# Patient Record
Sex: Male | Born: 1945 | Race: White | Hispanic: No | Marital: Married | Smoking: Never smoker
Health system: Southern US, Community
[De-identification: ages and names within clinical notes are randomized; demographics above are authoritative.]

## PROBLEM LIST (undated history)

## (undated) DIAGNOSIS — M79609 Pain in unspecified limb: Secondary | ICD-10-CM

## (undated) DIAGNOSIS — J449 Chronic obstructive pulmonary disease, unspecified: Secondary | ICD-10-CM

## (undated) DIAGNOSIS — I2510 Atherosclerotic heart disease of native coronary artery without angina pectoris: Secondary | ICD-10-CM

## (undated) DIAGNOSIS — J45909 Unspecified asthma, uncomplicated: Secondary | ICD-10-CM

## (undated) DIAGNOSIS — C61 Malignant neoplasm of prostate: Secondary | ICD-10-CM

## (undated) DIAGNOSIS — N209 Urinary calculus, unspecified: Secondary | ICD-10-CM

## (undated) DIAGNOSIS — M479 Spondylosis, unspecified: Secondary | ICD-10-CM

## (undated) DIAGNOSIS — E119 Type 2 diabetes mellitus without complications: Secondary | ICD-10-CM

## (undated) DIAGNOSIS — T7840XA Allergy, unspecified, initial encounter: Secondary | ICD-10-CM

## (undated) DIAGNOSIS — Z87442 Personal history of urinary calculi: Secondary | ICD-10-CM

## (undated) DIAGNOSIS — M549 Dorsalgia, unspecified: Secondary | ICD-10-CM

## (undated) DIAGNOSIS — K222 Esophageal obstruction: Secondary | ICD-10-CM

## (undated) DIAGNOSIS — G8929 Other chronic pain: Secondary | ICD-10-CM

## (undated) DIAGNOSIS — I071 Rheumatic tricuspid insufficiency: Secondary | ICD-10-CM

## (undated) DIAGNOSIS — N200 Calculus of kidney: Secondary | ICD-10-CM

## (undated) DIAGNOSIS — R32 Unspecified urinary incontinence: Secondary | ICD-10-CM

## (undated) DIAGNOSIS — N400 Enlarged prostate without lower urinary tract symptoms: Secondary | ICD-10-CM

## (undated) DIAGNOSIS — E11319 Type 2 diabetes mellitus with unspecified diabetic retinopathy without macular edema: Secondary | ICD-10-CM

## (undated) DIAGNOSIS — D126 Benign neoplasm of colon, unspecified: Secondary | ICD-10-CM

## (undated) DIAGNOSIS — J309 Allergic rhinitis, unspecified: Secondary | ICD-10-CM

## (undated) DIAGNOSIS — D649 Anemia, unspecified: Secondary | ICD-10-CM

## (undated) DIAGNOSIS — I10 Essential (primary) hypertension: Secondary | ICD-10-CM

## (undated) DIAGNOSIS — E1129 Type 2 diabetes mellitus with other diabetic kidney complication: Secondary | ICD-10-CM

## (undated) DIAGNOSIS — E785 Hyperlipidemia, unspecified: Secondary | ICD-10-CM

## (undated) DIAGNOSIS — K219 Gastro-esophageal reflux disease without esophagitis: Secondary | ICD-10-CM

## (undated) DIAGNOSIS — R1310 Dysphagia, unspecified: Secondary | ICD-10-CM

## (undated) HISTORY — DX: Unspecified urinary incontinence: R32

## (undated) HISTORY — DX: Spondylosis, unspecified: M47.9

## (undated) HISTORY — DX: Type 2 diabetes mellitus with other diabetic kidney complication: E11.29

## (undated) HISTORY — DX: Unspecified asthma, uncomplicated: J45.909

## (undated) HISTORY — DX: Malignant neoplasm of prostate: C61

## (undated) HISTORY — DX: Esophageal obstruction: K22.2

## (undated) HISTORY — DX: Benign prostatic hyperplasia without lower urinary tract symptoms: N40.0

## (undated) HISTORY — PX: HAND SURGERY: SHX662

## (undated) HISTORY — DX: Pain in unspecified limb: M79.609

## (undated) HISTORY — DX: Chronic obstructive pulmonary disease, unspecified: J44.9

## (undated) HISTORY — DX: Type 2 diabetes mellitus without complications: E11.9

## (undated) HISTORY — PX: POLYPECTOMY: SHX149

## (undated) HISTORY — DX: Anemia, unspecified: D64.9

## (undated) HISTORY — PX: LITHOTRIPSY: SUR834

## (undated) HISTORY — DX: Other chronic pain: G89.29

## (undated) HISTORY — DX: Allergy, unspecified, initial encounter: T78.40XA

## (undated) HISTORY — PX: OTHER SURGICAL HISTORY: SHX169

## (undated) HISTORY — DX: Gastro-esophageal reflux disease without esophagitis: K21.9

## (undated) HISTORY — DX: Dorsalgia, unspecified: M54.9

## (undated) HISTORY — DX: Calculus of kidney: N20.0

## (undated) HISTORY — PX: COLONOSCOPY: SHX174

## (undated) HISTORY — DX: Allergic rhinitis, unspecified: J30.9

## (undated) HISTORY — DX: Benign neoplasm of colon, unspecified: D12.6

## (undated) HISTORY — PX: SHOULDER SURGERY: SHX246

## (undated) HISTORY — DX: Dysphagia, unspecified: R13.10

## (undated) HISTORY — DX: Urinary calculus, unspecified: N20.9

## (undated) HISTORY — DX: Essential (primary) hypertension: I10

## (undated) HISTORY — DX: Type 2 diabetes mellitus with unspecified diabetic retinopathy without macular edema: E11.319

## (undated) HISTORY — DX: Hyperlipidemia, unspecified: E78.5

---

## 2000-03-31 ENCOUNTER — Encounter: Payer: Self-pay | Admitting: Endocrinology

## 2000-03-31 ENCOUNTER — Ambulatory Visit (HOSPITAL_COMMUNITY): Admission: RE | Admit: 2000-03-31 | Discharge: 2000-03-31 | Payer: Self-pay | Admitting: Endocrinology

## 2001-06-04 ENCOUNTER — Encounter: Payer: Self-pay | Admitting: Gastroenterology

## 2001-06-24 DIAGNOSIS — D126 Benign neoplasm of colon, unspecified: Secondary | ICD-10-CM

## 2001-06-24 HISTORY — DX: Benign neoplasm of colon, unspecified: D12.6

## 2001-07-09 ENCOUNTER — Encounter: Payer: Self-pay | Admitting: Gastroenterology

## 2001-07-09 ENCOUNTER — Encounter (INDEPENDENT_AMBULATORY_CARE_PROVIDER_SITE_OTHER): Payer: Self-pay | Admitting: Specialist

## 2001-07-09 ENCOUNTER — Other Ambulatory Visit: Admission: RE | Admit: 2001-07-09 | Discharge: 2001-07-09 | Payer: Self-pay | Admitting: Gastroenterology

## 2001-07-09 LAB — HM COLONOSCOPY

## 2005-08-18 ENCOUNTER — Ambulatory Visit: Payer: Self-pay | Admitting: Endocrinology

## 2005-08-22 ENCOUNTER — Ambulatory Visit: Payer: Self-pay | Admitting: Endocrinology

## 2005-09-05 ENCOUNTER — Ambulatory Visit: Payer: Self-pay

## 2006-09-10 ENCOUNTER — Ambulatory Visit: Payer: Self-pay | Admitting: Endocrinology

## 2006-09-10 LAB — CONVERTED CEMR LAB
Creatinine,U: 99.6 mg/dL
Hgb A1c MFr Bld: 8.2 % — ABNORMAL HIGH
Microalb Creat Ratio: 72.3 mg/g — ABNORMAL HIGH
Microalb, Ur: 7.2 mg/dL — ABNORMAL HIGH
Uric Acid, Serum: 4.4 mg/dL

## 2006-09-23 ENCOUNTER — Ambulatory Visit: Payer: Self-pay | Admitting: Endocrinology

## 2007-07-31 ENCOUNTER — Inpatient Hospital Stay: Payer: Self-pay | Admitting: Urology

## 2007-08-01 ENCOUNTER — Other Ambulatory Visit: Payer: Self-pay

## 2007-08-01 IMAGING — CR DG ABDOMEN 1V
1 series · 2 of 2 positions shown · non-contrast
Comparison: none

REASON FOR EXAM: localize stones
COMMENTS:

PROCEDURE:     DXR - DXR KIDNEY URETER BLADDER  - [DATE]  [DATE]
RESULT:     Soft tissue structures are unremarkable. Bilateral
nephrolithiasis is noted.

[Series 1: view not recorded · 0.17mm/px · 2 of 2 slices shown]
[im 1/2]
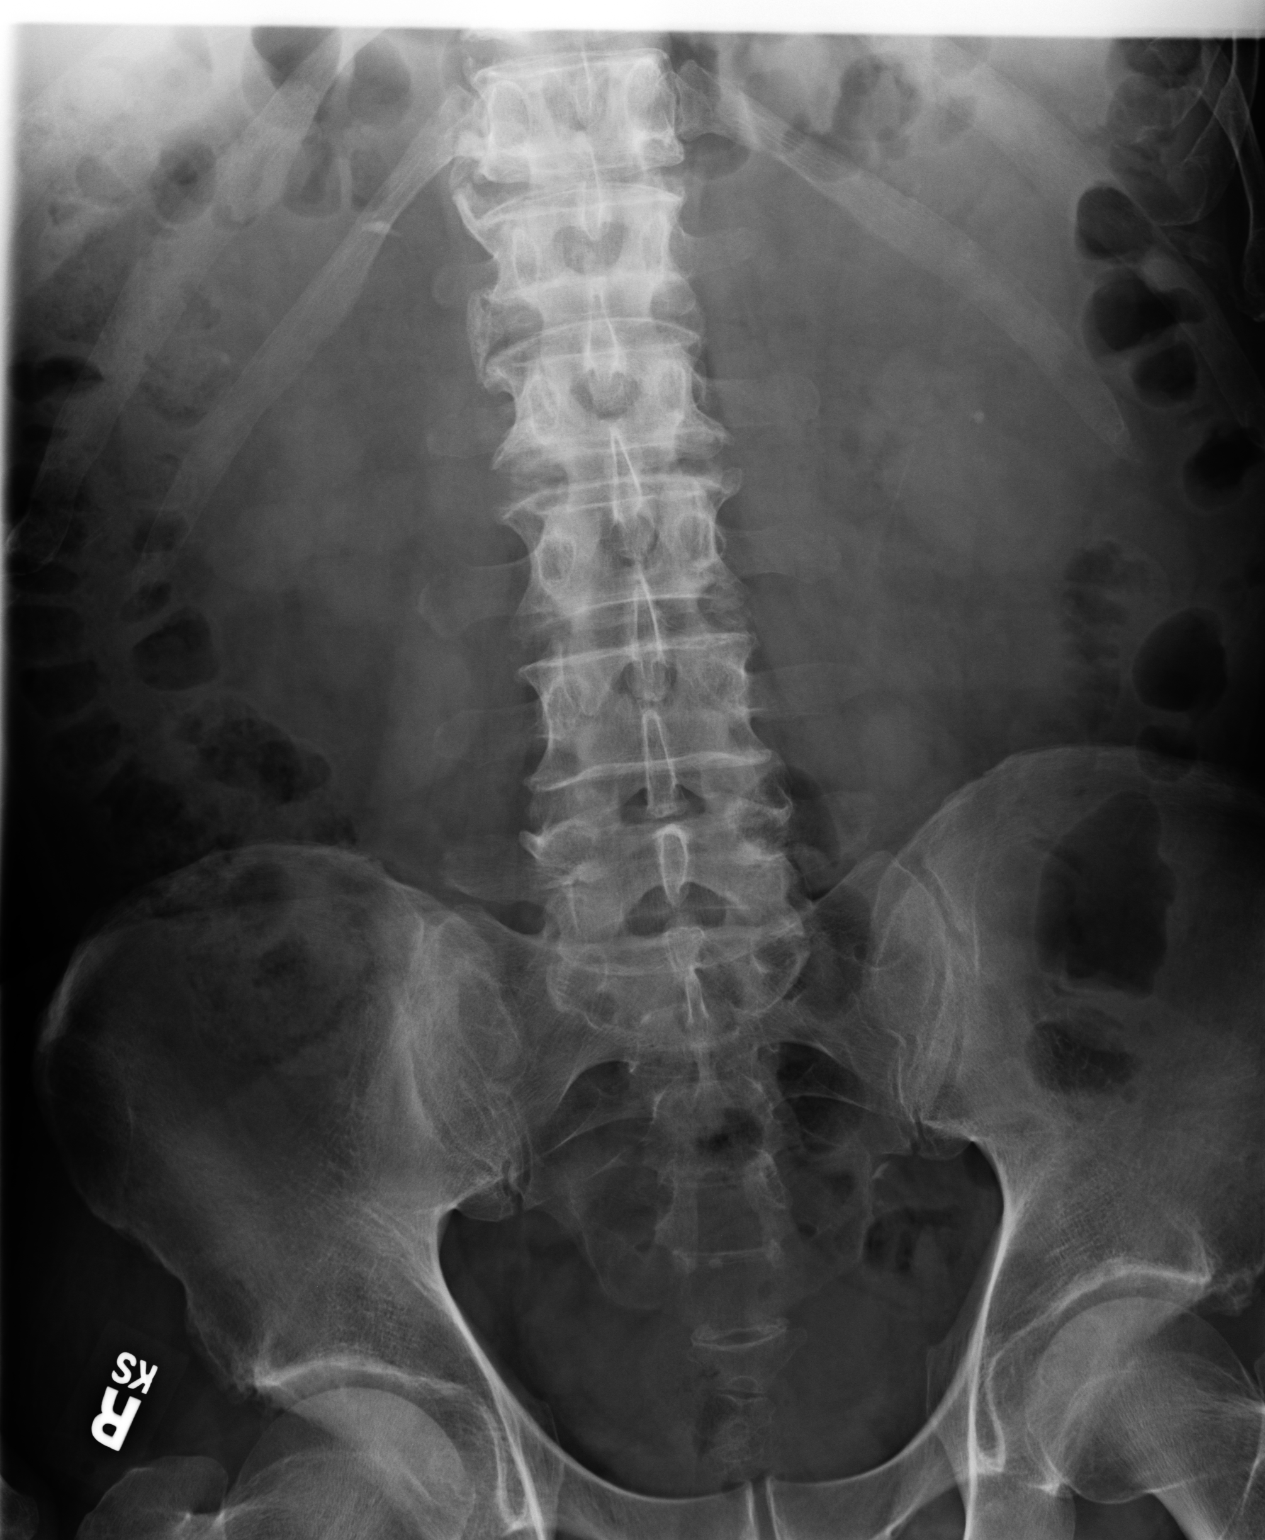
[im 2/2]
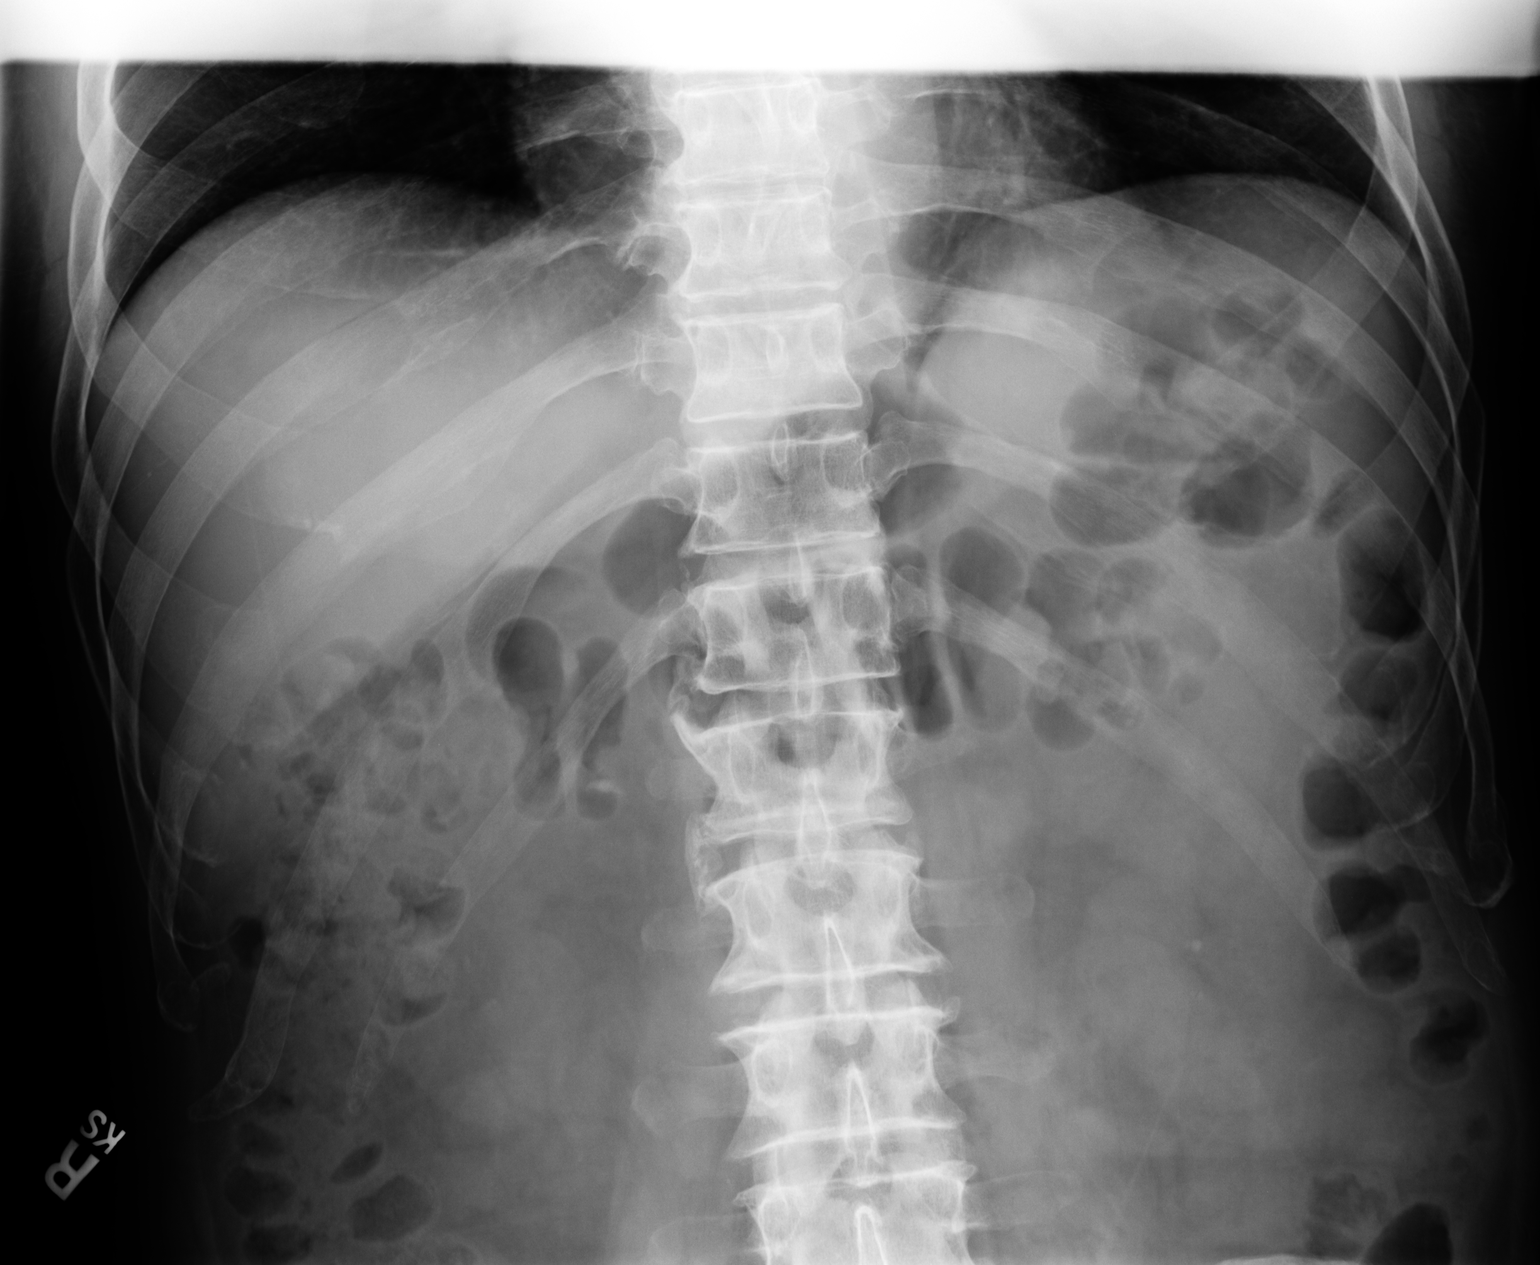

[2 of 2 positions shown; findings below may reference images not displayed]

IMPRESSION: 1)No acute or focal abnormalities are identified.

2)Small bilateral renal caliceal stones.

## 2007-08-03 ENCOUNTER — Encounter: Payer: Self-pay | Admitting: Endocrinology

## 2007-08-03 DIAGNOSIS — Z860100 Personal history of colon polyps, unspecified: Secondary | ICD-10-CM | POA: Insufficient documentation

## 2007-08-03 DIAGNOSIS — J309 Allergic rhinitis, unspecified: Secondary | ICD-10-CM

## 2007-08-03 DIAGNOSIS — E785 Hyperlipidemia, unspecified: Secondary | ICD-10-CM

## 2007-08-03 DIAGNOSIS — N400 Enlarged prostate without lower urinary tract symptoms: Secondary | ICD-10-CM

## 2007-08-03 HISTORY — DX: Hyperlipidemia, unspecified: E78.5

## 2007-08-03 HISTORY — DX: Allergic rhinitis, unspecified: J30.9

## 2007-08-03 HISTORY — DX: Benign prostatic hyperplasia without lower urinary tract symptoms: N40.0

## 2007-08-05 ENCOUNTER — Ambulatory Visit: Payer: Self-pay | Admitting: Urology

## 2007-08-05 IMAGING — CR DG ABDOMEN 1V
1 series · 1 of 1 positions shown · non-contrast
Comparison: none

REASON FOR EXAM: Renal calculi-lithotripsy
COMMENTS:

[view not recorded]
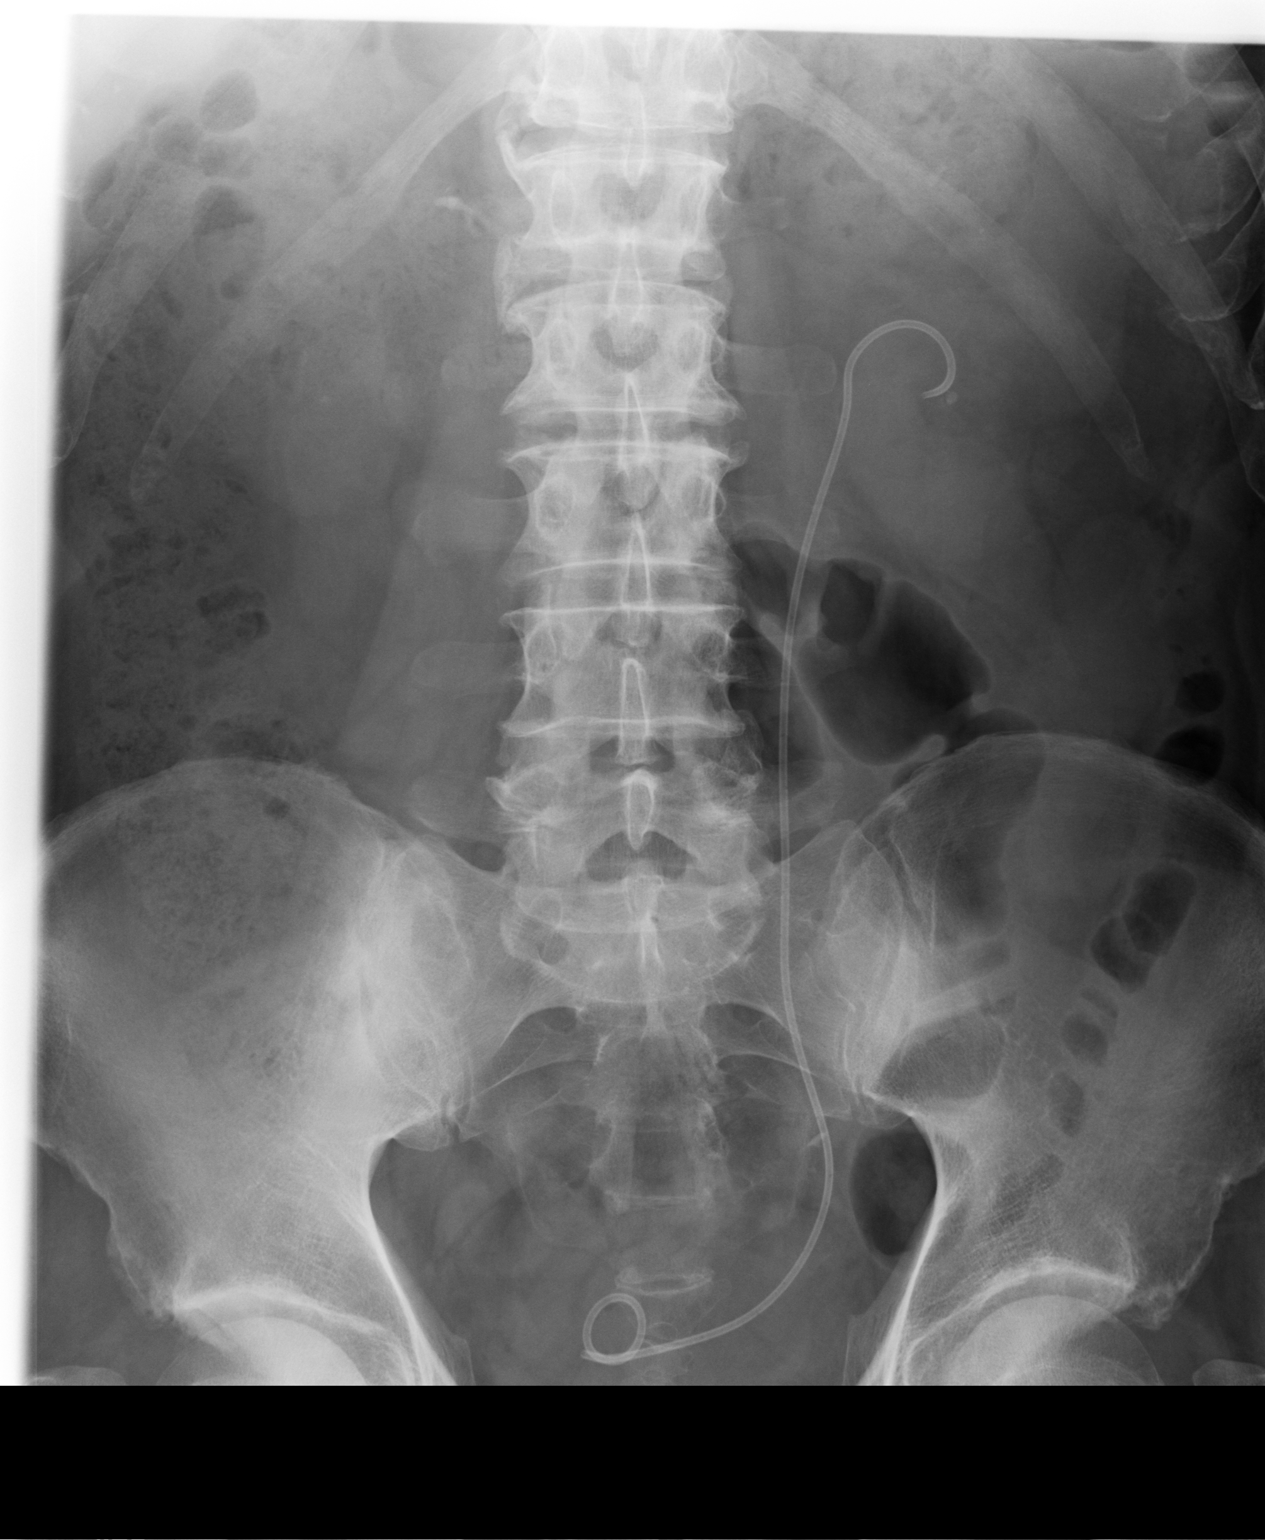

[1 of 1 positions shown; findings below may reference images not displayed]

PROCEDURE:     DXR - DXR KIDNEY URETER BLADDER  - [DATE] [DATE]

RESULT:     This is a double-J ureteral stent on the LEFT. The proximal tip
of the stent lies adjacent to a calcification in a lower pole calix on the
LEFT. This calcification appears unchanged from [DATE]. There is mild
degenerative change of the lumbar spine. The bowel gas pattern is normal.
Faint calcification projects over the lower pole of the RIGHT kidney.
IMPRESSION: There is a double-J ureteral stent on the LEFT.

Further interpretation is deferred to Dr. TOBI.

## 2007-09-14 ENCOUNTER — Ambulatory Visit: Payer: Self-pay | Admitting: Endocrinology

## 2007-09-14 LAB — CONVERTED CEMR LAB
ALT: 15 units/L (ref 0–53)
Albumin: 3.9 g/dL (ref 3.5–5.2)
Alkaline Phosphatase: 40 units/L (ref 39–117)
BUN: 15 mg/dL (ref 6–23)
Bacteria, UA: NEGATIVE
Basophils Relative: 0 % (ref 0.0–1.0)
CO2: 30 meq/L (ref 19–32)
Calcium: 9.5 mg/dL (ref 8.4–10.5)
Chloride: 107 meq/L (ref 96–112)
Cholesterol: 78 mg/dL (ref 0–200)
Eosinophils Absolute: 0.3 10*3/uL (ref 0.0–0.6)
Eosinophils Relative: 3 % (ref 0.0–5.0)
GFR calc Af Amer: 88 mL/min
GFR calc non Af Amer: 73 mL/min
Glucose, Bld: 172 mg/dL — ABNORMAL HIGH (ref 70–99)
HDL: 40 mg/dL (ref >39.0)
Hgb A1c MFr Bld: 8.5 % — ABNORMAL HIGH (ref 4.6–6.0)
Ketones, ur: NEGATIVE mg/dL
Leukocytes, UA: NEGATIVE
MCV: 94 fL (ref 78.0–100.0)
Microalb Creat Ratio: 117.5 mg/g — ABNORMAL HIGH (ref 0.0–30.0)
Microalb, Ur: 14.7 mg/dL — ABNORMAL HIGH (ref 0.0–1.9)
Monocytes Relative: 9 % (ref 3.0–11.0)
Neutro Abs: 4.9 10*3/uL (ref 1.4–7.7)
Platelets: 160 10*3/uL (ref 150–400)
Potassium: 4.5 meq/L (ref 3.5–5.1)
RBC: 3.81 M/uL — ABNORMAL LOW (ref 4.22–5.81)
RDW: 13.4 % (ref 11.5–14.6)
Specific Gravity, Urine: 1.025 (ref 1.000–1.03)
Squamous Epithelial / LPF: NEGATIVE /lpf
TSH: 0.8 microintl units/mL (ref 0.35–5.50)
Total CHOL/HDL Ratio: 2
Total Protein: 6.8 g/dL (ref 6.0–8.3)
Triglycerides: 56 mg/dL (ref 0–149)
Urine Glucose: NEGATIVE mg/dL
Urobilinogen, UA: 0.2 (ref 0.0–1.0)
VLDL: 11 mg/dL (ref 0–40)
WBC: 8.5 10*3/uL (ref 4.5–10.5)
pH: 5.5 (ref 5.0–8.0)

## 2007-09-22 ENCOUNTER — Ambulatory Visit: Payer: Self-pay | Admitting: Endocrinology

## 2007-09-22 IMAGING — CR DG CHEST 2V
2 series · 2 of 2 positions shown · non-contrast
Comparison: None available.

CLINICAL DATA: Cough. 
CHEST - 2 VIEW:

[view not recorded (1 of 2)]
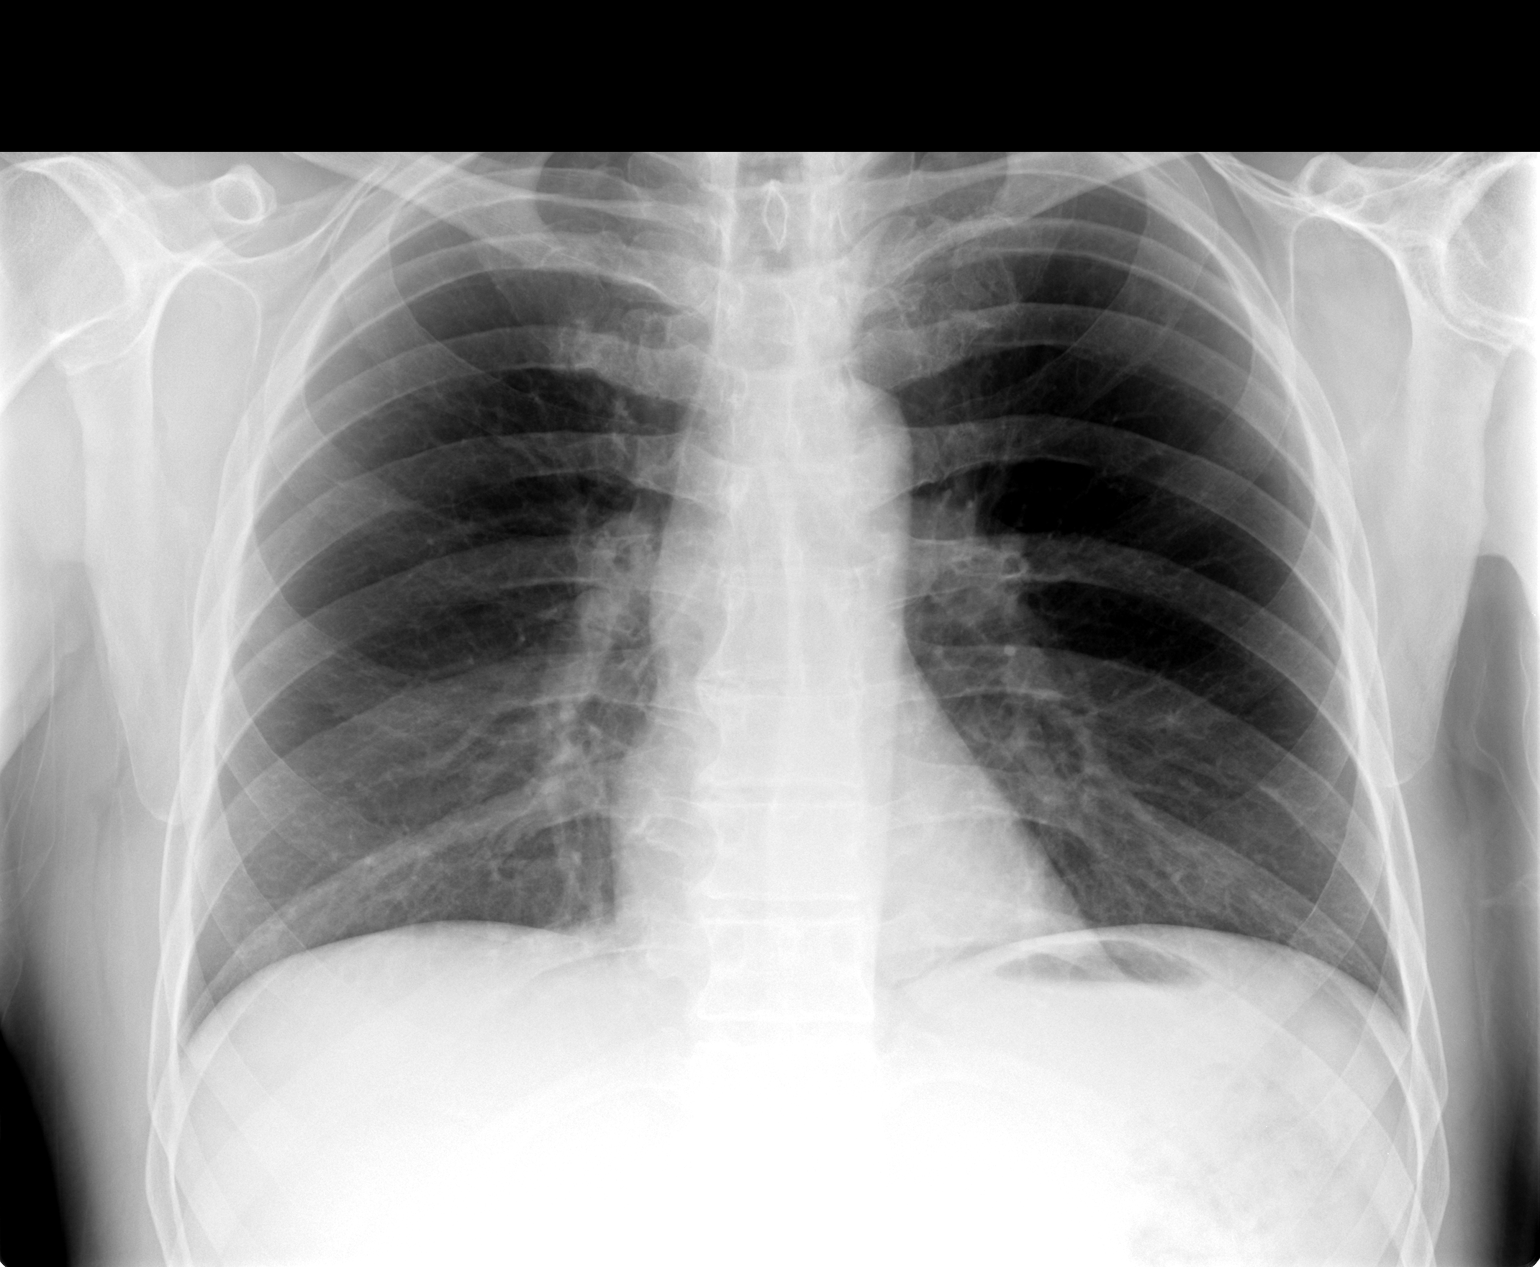

[view not recorded (2 of 2)]
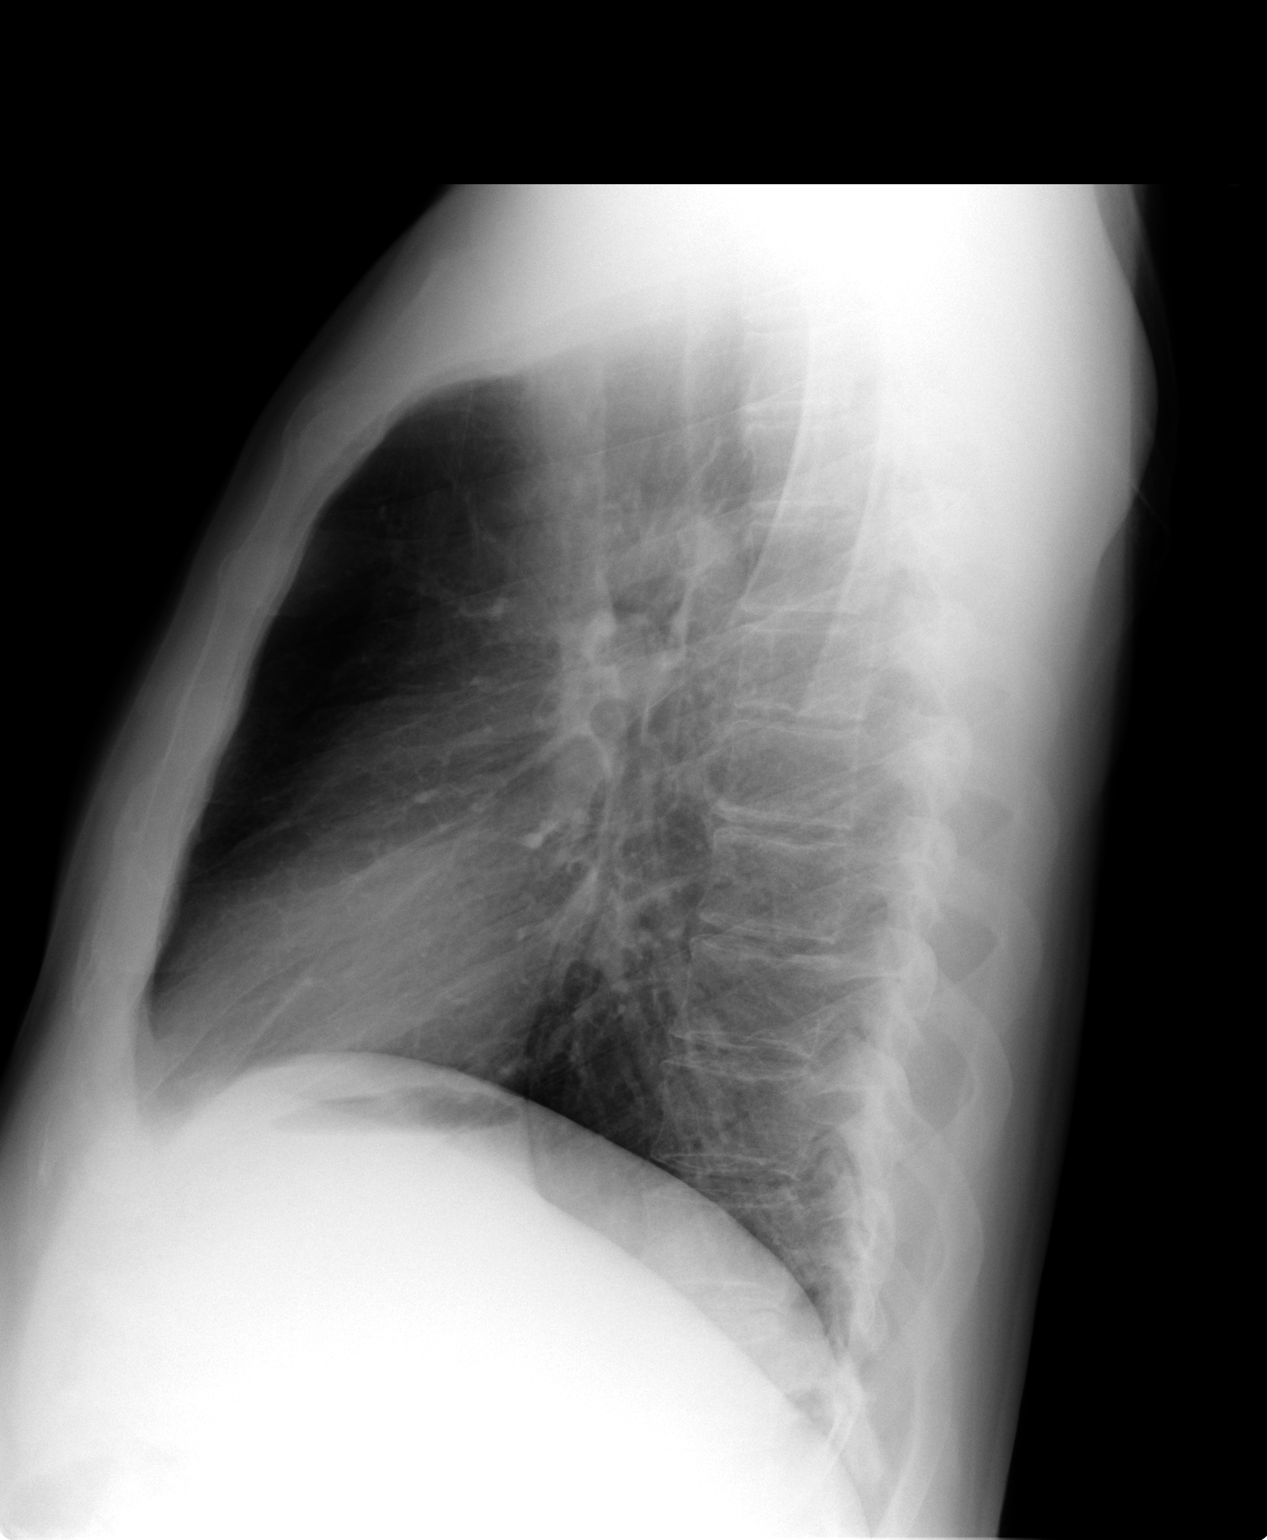

[2 of 2 positions shown; findings below may reference images not displayed]

FINDINGS: The heart size and mediastinal contours are within normal limits.  Both lungs are clear.  The visualized skeletal structures are unremarkable.  
I get the impression there may be emphysematous changes in the left mid to upper lung zone as the pulmonary markings including vascular markings appear attenuated and sparse.
IMPRESSION: No acute chest findings.  Suspicion for emphysematous changes particularly involving the left lung.

## 2007-12-03 ENCOUNTER — Encounter: Payer: Self-pay | Admitting: Endocrinology

## 2007-12-29 ENCOUNTER — Ambulatory Visit: Payer: Self-pay | Admitting: Endocrinology

## 2007-12-29 DIAGNOSIS — D649 Anemia, unspecified: Secondary | ICD-10-CM

## 2007-12-29 DIAGNOSIS — E7800 Pure hypercholesterolemia, unspecified: Secondary | ICD-10-CM

## 2007-12-29 HISTORY — DX: Anemia, unspecified: D64.9

## 2007-12-29 LAB — CONVERTED CEMR LAB
ALT: 18 units/L (ref 0–53)
AST: 20 units/L (ref 0–37)
Bilirubin, Direct: 0.2 mg/dL (ref 0.0–0.3)
Cholesterol: 108 mg/dL (ref 0–200)
Eosinophils Absolute: 0.3 10*3/uL (ref 0.0–0.6)
Eosinophils Relative: 5.9 % — ABNORMAL HIGH (ref 0.0–5.0)
HCT: 36.9 % — ABNORMAL LOW (ref 39.0–52.0)
Hemoglobin: 12.4 g/dL — ABNORMAL LOW (ref 13.0–17.0)
Iron: 63 ug/dL (ref 42–165)
MCV: 95.1 fL (ref 78.0–100.0)
Monocytes Absolute: 0.7 10*3/uL (ref 0.2–0.7)
Neutrophils Relative %: 49 % (ref 43.0–77.0)
RBC: 3.89 M/uL — ABNORMAL LOW (ref 4.22–5.81)
RDW: 13.8 % (ref 11.5–14.6)
Total CHOL/HDL Ratio: 2.9
Total Protein: 6.7 g/dL (ref 6.0–8.3)
WBC: 5.9 10*3/uL (ref 4.5–10.5)

## 2008-01-07 ENCOUNTER — Ambulatory Visit: Payer: Self-pay | Admitting: Endocrinology

## 2008-01-07 DIAGNOSIS — I10 Essential (primary) hypertension: Secondary | ICD-10-CM

## 2008-01-07 DIAGNOSIS — M79609 Pain in unspecified limb: Secondary | ICD-10-CM | POA: Insufficient documentation

## 2008-01-07 DIAGNOSIS — I152 Hypertension secondary to endocrine disorders: Secondary | ICD-10-CM | POA: Insufficient documentation

## 2008-01-07 DIAGNOSIS — E1159 Type 2 diabetes mellitus with other circulatory complications: Secondary | ICD-10-CM | POA: Insufficient documentation

## 2008-01-07 HISTORY — DX: Pain in unspecified limb: M79.609

## 2008-01-07 HISTORY — DX: Essential (primary) hypertension: I10

## 2008-07-04 ENCOUNTER — Telehealth: Payer: Self-pay | Admitting: Endocrinology

## 2008-09-25 ENCOUNTER — Ambulatory Visit: Payer: Self-pay | Admitting: Endocrinology

## 2008-09-25 LAB — CONVERTED CEMR LAB
ALT: 18 units/L (ref 0–53)
Basophils Absolute: 0 10*3/uL (ref 0.0–0.1)
Bilirubin Urine: NEGATIVE
Bilirubin, Direct: 0.1 mg/dL (ref 0.0–0.3)
CO2: 28 meq/L (ref 19–32)
Calcium: 9.1 mg/dL (ref 8.4–10.5)
HDL: 34.9 mg/dL — ABNORMAL LOW (ref >39.0)
Hemoglobin, Urine: NEGATIVE
Ketones, ur: NEGATIVE mg/dL
LDL Cholesterol: 70 mg/dL (ref 0–99)
Leukocytes, UA: NEGATIVE
Lymphocytes Relative: 35.1 % (ref 12.0–46.0)
MCHC: 35 g/dL (ref 30.0–36.0)
Neutro Abs: 2.6 10*3/uL (ref 1.4–7.7)
Neutrophils Relative %: 48.8 % (ref 43.0–77.0)
Platelets: 207 10*3/uL (ref 150–400)
RDW: 13 % (ref 11.5–14.6)
Sodium: 140 meq/L (ref 135–145)
TSH: 0.7 microintl units/mL (ref 0.35–5.50)
Total Bilirubin: 0.7 mg/dL (ref 0.3–1.2)
Total CHOL/HDL Ratio: 3.5
Triglycerides: 92 mg/dL (ref 0–149)
Urobilinogen, UA: 0.2 (ref 0.0–1.0)

## 2008-09-29 ENCOUNTER — Ambulatory Visit: Payer: Self-pay | Admitting: Endocrinology

## 2008-09-29 DIAGNOSIS — R1310 Dysphagia, unspecified: Secondary | ICD-10-CM

## 2008-09-29 DIAGNOSIS — N209 Urinary calculus, unspecified: Secondary | ICD-10-CM | POA: Insufficient documentation

## 2008-09-29 DIAGNOSIS — E113299 Type 2 diabetes mellitus with mild nonproliferative diabetic retinopathy without macular edema, unspecified eye: Secondary | ICD-10-CM

## 2008-09-29 DIAGNOSIS — E1129 Type 2 diabetes mellitus with other diabetic kidney complication: Secondary | ICD-10-CM

## 2008-09-29 DIAGNOSIS — E11319 Type 2 diabetes mellitus with unspecified diabetic retinopathy without macular edema: Secondary | ICD-10-CM | POA: Insufficient documentation

## 2008-09-29 DIAGNOSIS — M479 Spondylosis, unspecified: Secondary | ICD-10-CM

## 2008-09-29 HISTORY — DX: Dysphagia, unspecified: R13.10

## 2008-09-29 HISTORY — DX: Type 2 diabetes mellitus with unspecified diabetic retinopathy without macular edema: E11.319

## 2008-09-29 HISTORY — DX: Urinary calculus, unspecified: N20.9

## 2008-09-29 HISTORY — DX: Spondylosis, unspecified: M47.9

## 2008-09-29 HISTORY — DX: Type 2 diabetes mellitus with other diabetic kidney complication: E11.29

## 2008-11-28 ENCOUNTER — Ambulatory Visit: Payer: Self-pay | Admitting: Gastroenterology

## 2008-11-28 DIAGNOSIS — R1319 Other dysphagia: Secondary | ICD-10-CM

## 2008-11-28 DIAGNOSIS — K219 Gastro-esophageal reflux disease without esophagitis: Secondary | ICD-10-CM

## 2008-11-28 DIAGNOSIS — K5909 Other constipation: Secondary | ICD-10-CM | POA: Insufficient documentation

## 2008-11-28 HISTORY — DX: Gastro-esophageal reflux disease without esophagitis: K21.9

## 2008-12-01 ENCOUNTER — Ambulatory Visit: Payer: Self-pay | Admitting: Gastroenterology

## 2008-12-01 LAB — HM COLONOSCOPY

## 2008-12-28 ENCOUNTER — Ambulatory Visit: Payer: Self-pay | Admitting: Gastroenterology

## 2008-12-28 DIAGNOSIS — K222 Esophageal obstruction: Secondary | ICD-10-CM

## 2008-12-28 HISTORY — DX: Esophageal obstruction: K22.2

## 2009-01-09 ENCOUNTER — Telehealth: Payer: Self-pay | Admitting: Endocrinology

## 2009-09-20 ENCOUNTER — Ambulatory Visit: Payer: Self-pay | Admitting: Urology

## 2009-09-20 IMAGING — CT CT ABDOMEN AND PELVIS WITHOUT AND WITH CONTRAST
2 of 4 series · 13 of 32 positions shown, 18 images · non-contrast
Comparison: none

REASON FOR EXAM: hematuria    stones
COMMENTS:

[Series 4: soft tissue with · axial · 0.81mm/px · z∈[+68,+478]mm · 8 of 106 slices shown, 13 images]
[im 12/106  soft-tissue]
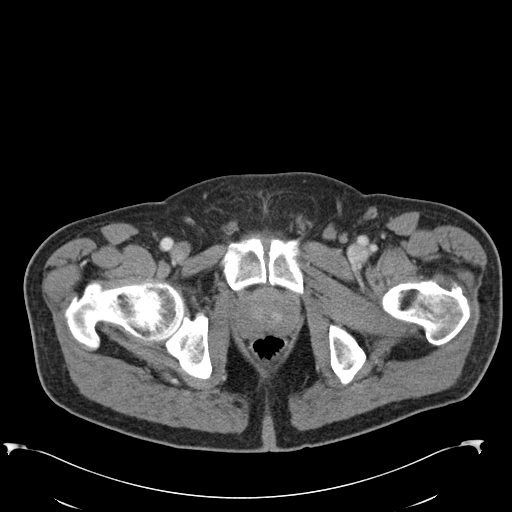
[im 12/106  bone]
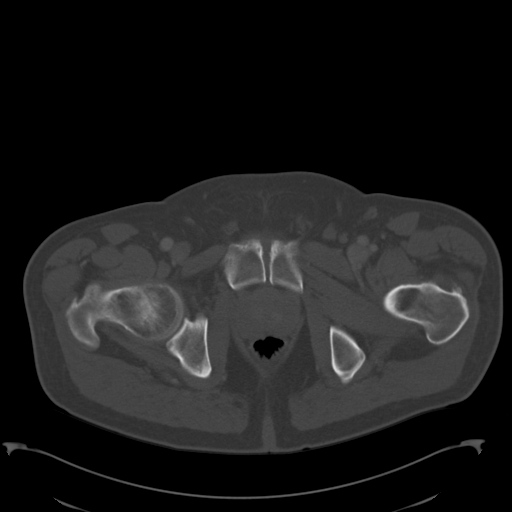
[im 24/106  soft-tissue]
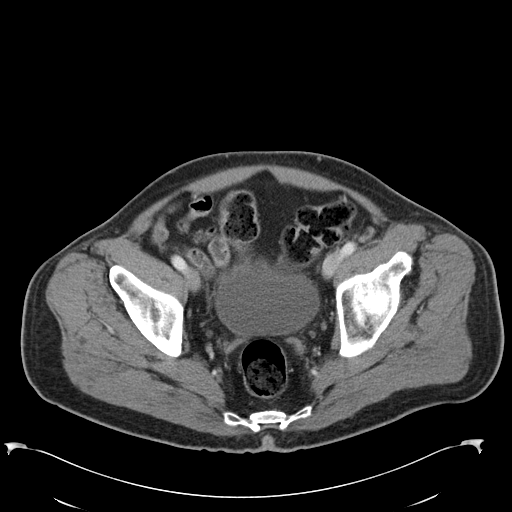
[im 36/106  soft-tissue]
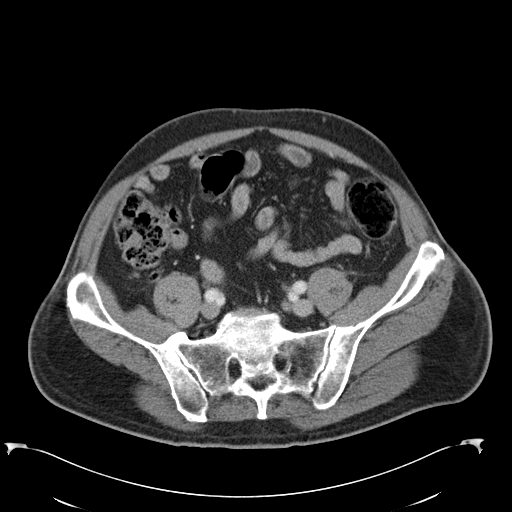
[im 47/106  soft-tissue]
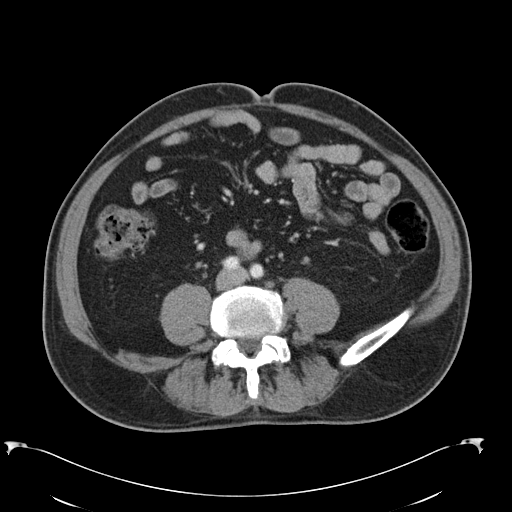
[im 59/106  soft-tissue]
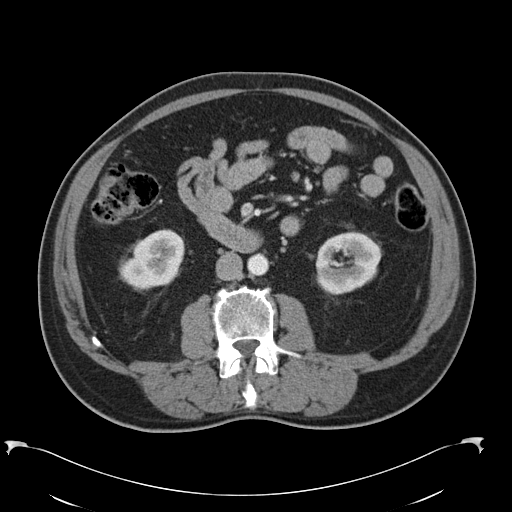
[im 59/106  lung]
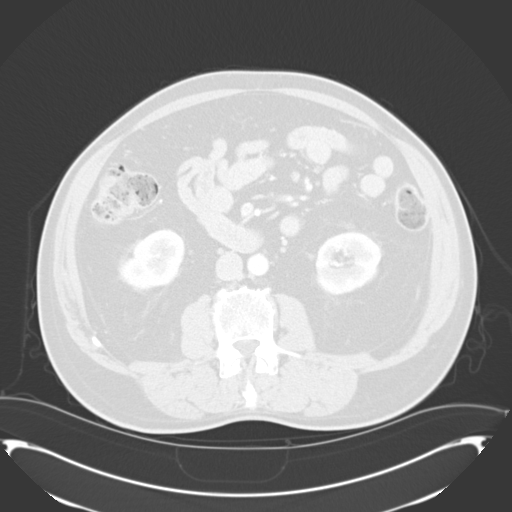
[im 71/106  soft-tissue]
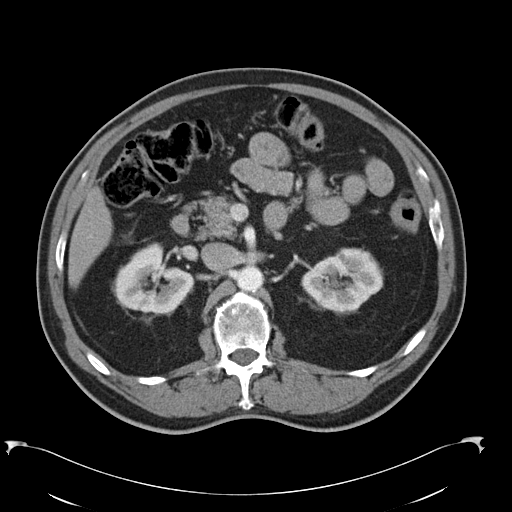
[im 71/106  lung]
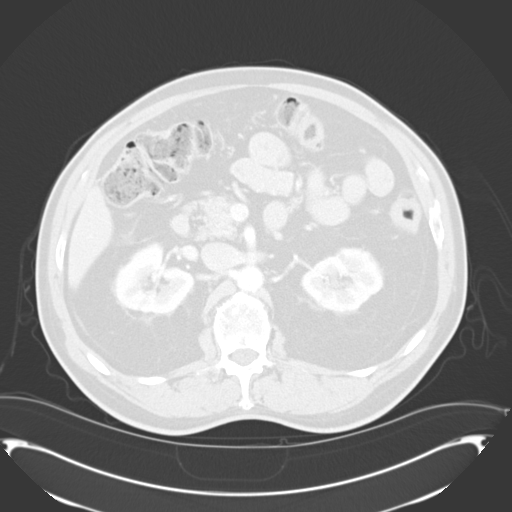
[im 82/106  soft-tissue]
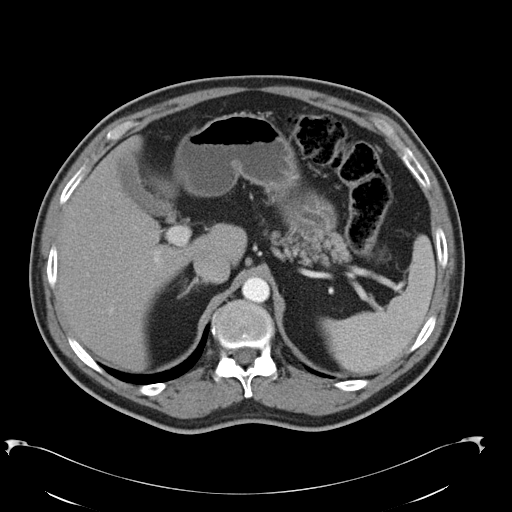
[im 82/106  lung]
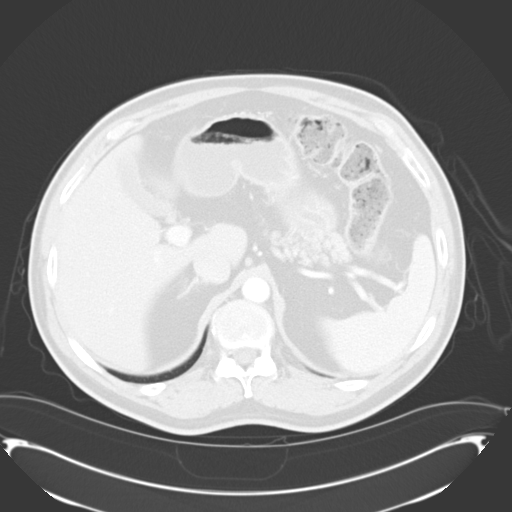
[im 94/106  soft-tissue]
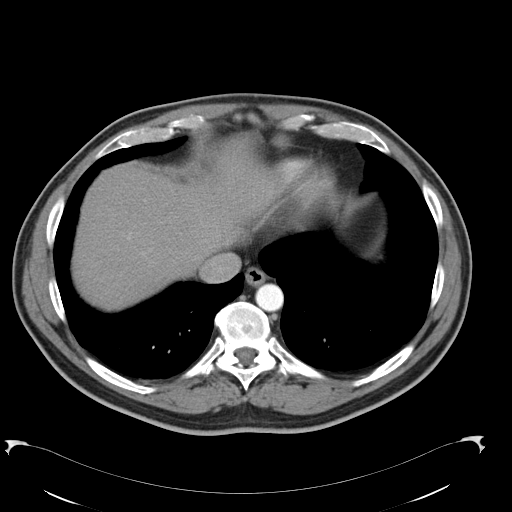
[im 94/106  lung]
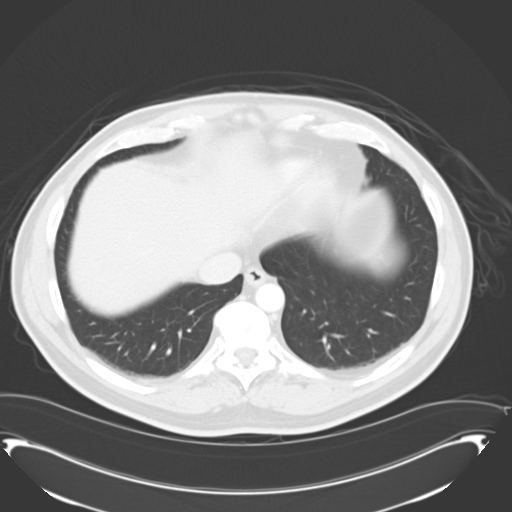

[Series 6: soft tissue delay · axial · delayed · 0.81mm/px · z∈[+68,+304]mm · 5 of 106 slices shown]
[im 12/106  soft-tissue]
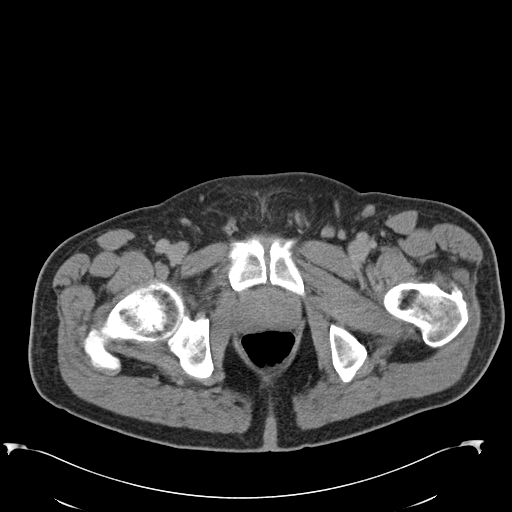
[im 24/106  soft-tissue]
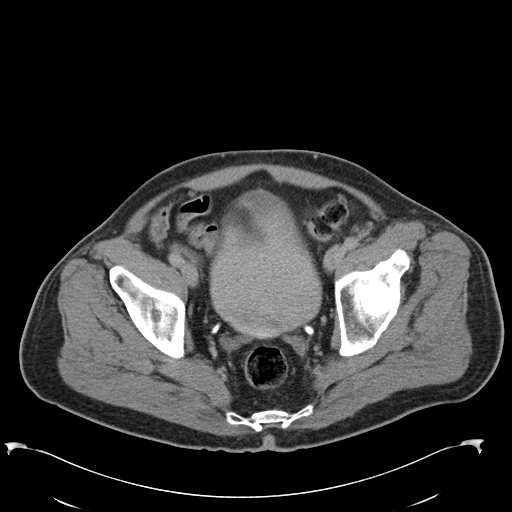
[im 36/106  soft-tissue]
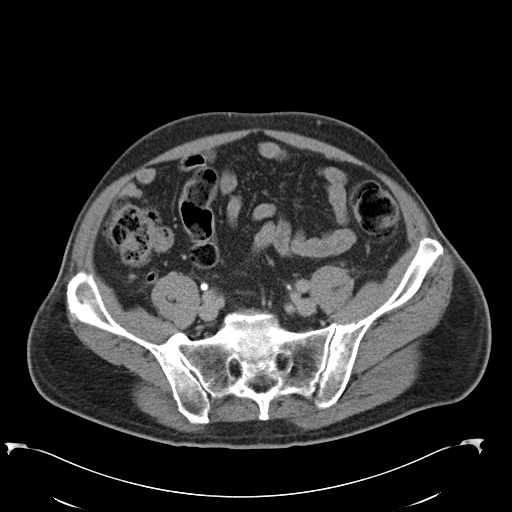
[im 47/106  soft-tissue]
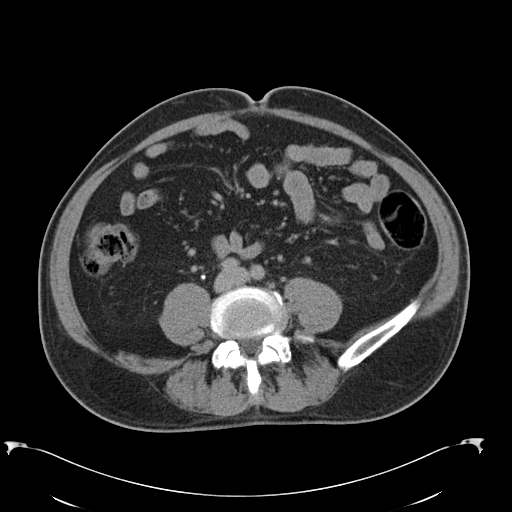
[im 59/106  soft-tissue]
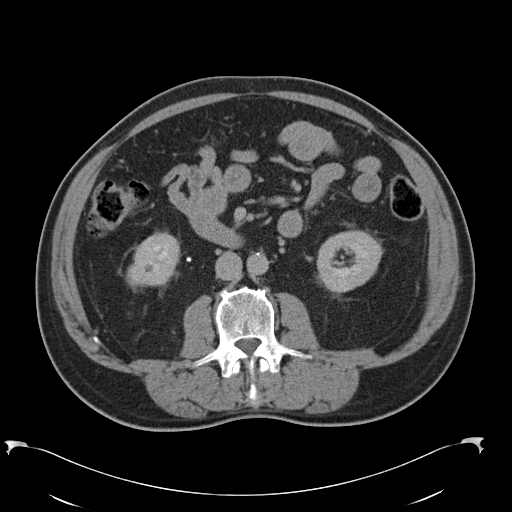

[13 of 32 positions shown; findings below may reference images not displayed]

PROCEDURE:     CT  - CT ABDOMEN / PELVIS  W/WO  - [DATE] [DATE]

RESULT:     A triphasic CT scan was performed through the abdomen and pelvis
at 5 mm intervals and slice thicknesses. The patient received 100 cc of
Isovue 370 for the postcontrast images. Review of 3-dimensional
reconstructed images was performed separately on the [employer] monitor.

On the noncontrast studies there is a 1 mm diameter stone in a midpole calyx
on the left. There is approximately 4 mm x 3 mm diameter stone in a lower
pole calyx on the left. There are sub-millimeter calcified stones in the
upper, mid, and lower poles of the right kidney. Neither kidney exhibits
evidence of obstruction.

 There is a tiny exophytic hypodensity from the lower pole of the right
kidney. It measures no more than approximately 10 mm in greatest dimension.
It has Hounsfield measurement of 29 precontrast, 29 postcontrast, and 22 on
delayed images. Exophytic from the upper pole of the left kidney there is a
1.5 x 2 cm soft tissue nodular density. It exhibits Hounsfield measurements
of 40 precontrast, 37 immediately postcontrast, and 34 on delayed images. In
the lower pole of the right kidney there is a subcentimeter hypodensity seen
on image 49 on the postcontrast images. In the lower pole the left kidney a
subcentimeter hypodensity is seen  on image 47.

The liver, spleen, partially distended stomach, gallbladder, pancreas, and
adrenal glands are normal in appearance. There is a small hiatal hernia. The
caliber of the abdominal aorta is normal. The unopacified loops of small and
large bowel exhibit no acute abnormality. The prostate gland is mildly
enlarged and produces a moderate impression upon the urinary bladder base.
The lung bases are clear. The lumbar vertebral bodies are preserved in
height.
IMPRESSION: 1. There are nonobstructing stones in both kidneys. There are cortically
based hypodensities in both kidneys most compatible with benign hyperdense
cysts.
2. There is enlargement of the prostate gland. The urinary bladder is
grossly normal.
3. There is no acute bowel abnormality nor acute hepatobiliary abnormality.

## 2009-10-24 ENCOUNTER — Telehealth: Payer: Self-pay | Admitting: Endocrinology

## 2009-11-07 ENCOUNTER — Ambulatory Visit: Payer: Self-pay | Admitting: Endocrinology

## 2009-11-07 LAB — CONVERTED CEMR LAB
Bilirubin Urine: NEGATIVE
Bilirubin, Direct: 0.1 mg/dL (ref 0.0–0.3)
Cholesterol: 105 mg/dL (ref 0–200)
Creatinine,U: 185.6 mg/dL
Eosinophils Absolute: 0.2 10*3/uL (ref 0.0–0.7)
GFR calc non Af Amer: 65.32 mL/min (ref >60)
Glucose, Bld: 190 mg/dL — ABNORMAL HIGH (ref 70–99)
HDL: 37.8 mg/dL — ABNORMAL LOW (ref >39.00)
Leukocytes, UA: NEGATIVE
MCHC: 33.6 g/dL (ref 30.0–36.0)
MCV: 94.8 fL (ref 78.0–100.0)
Monocytes Absolute: 0.5 10*3/uL (ref 0.1–1.0)
Neutrophils Relative %: 50.9 % (ref 43.0–77.0)
Nitrite: NEGATIVE
PSA: 1.97 ng/mL (ref 0.10–4.00)
Platelets: 198 10*3/uL (ref 150.0–400.0)
Potassium: 4.9 meq/L (ref 3.5–5.1)
RDW: 14.1 % (ref 11.5–14.6)
Sodium: 140 meq/L (ref 135–145)
Specific Gravity, Urine: >=1.03 (ref 1.000–1.030)
Total Bilirubin: 0.7 mg/dL (ref 0.3–1.2)
Triglycerides: 49 mg/dL (ref 0.0–149.0)
Urobilinogen, UA: 0.2 (ref 0.0–1.0)
VLDL: 9.8 mg/dL (ref 0.0–40.0)
pH: 5.5 (ref 5.0–8.0)

## 2009-11-13 ENCOUNTER — Ambulatory Visit: Payer: Self-pay | Admitting: Endocrinology

## 2009-11-13 DIAGNOSIS — M25559 Pain in unspecified hip: Secondary | ICD-10-CM

## 2009-11-13 IMAGING — CR DG HIP (WITH OR WITHOUT PELVIS) 2-3V*L*
3 series · 3 of 3 positions shown · non-contrast
Comparison: Left hip [DATE].

CLINICAL DATA: Left hip pain.

LEFT HIP - COMPLETE 2+ VIEW

[view not recorded (1 of 3)]
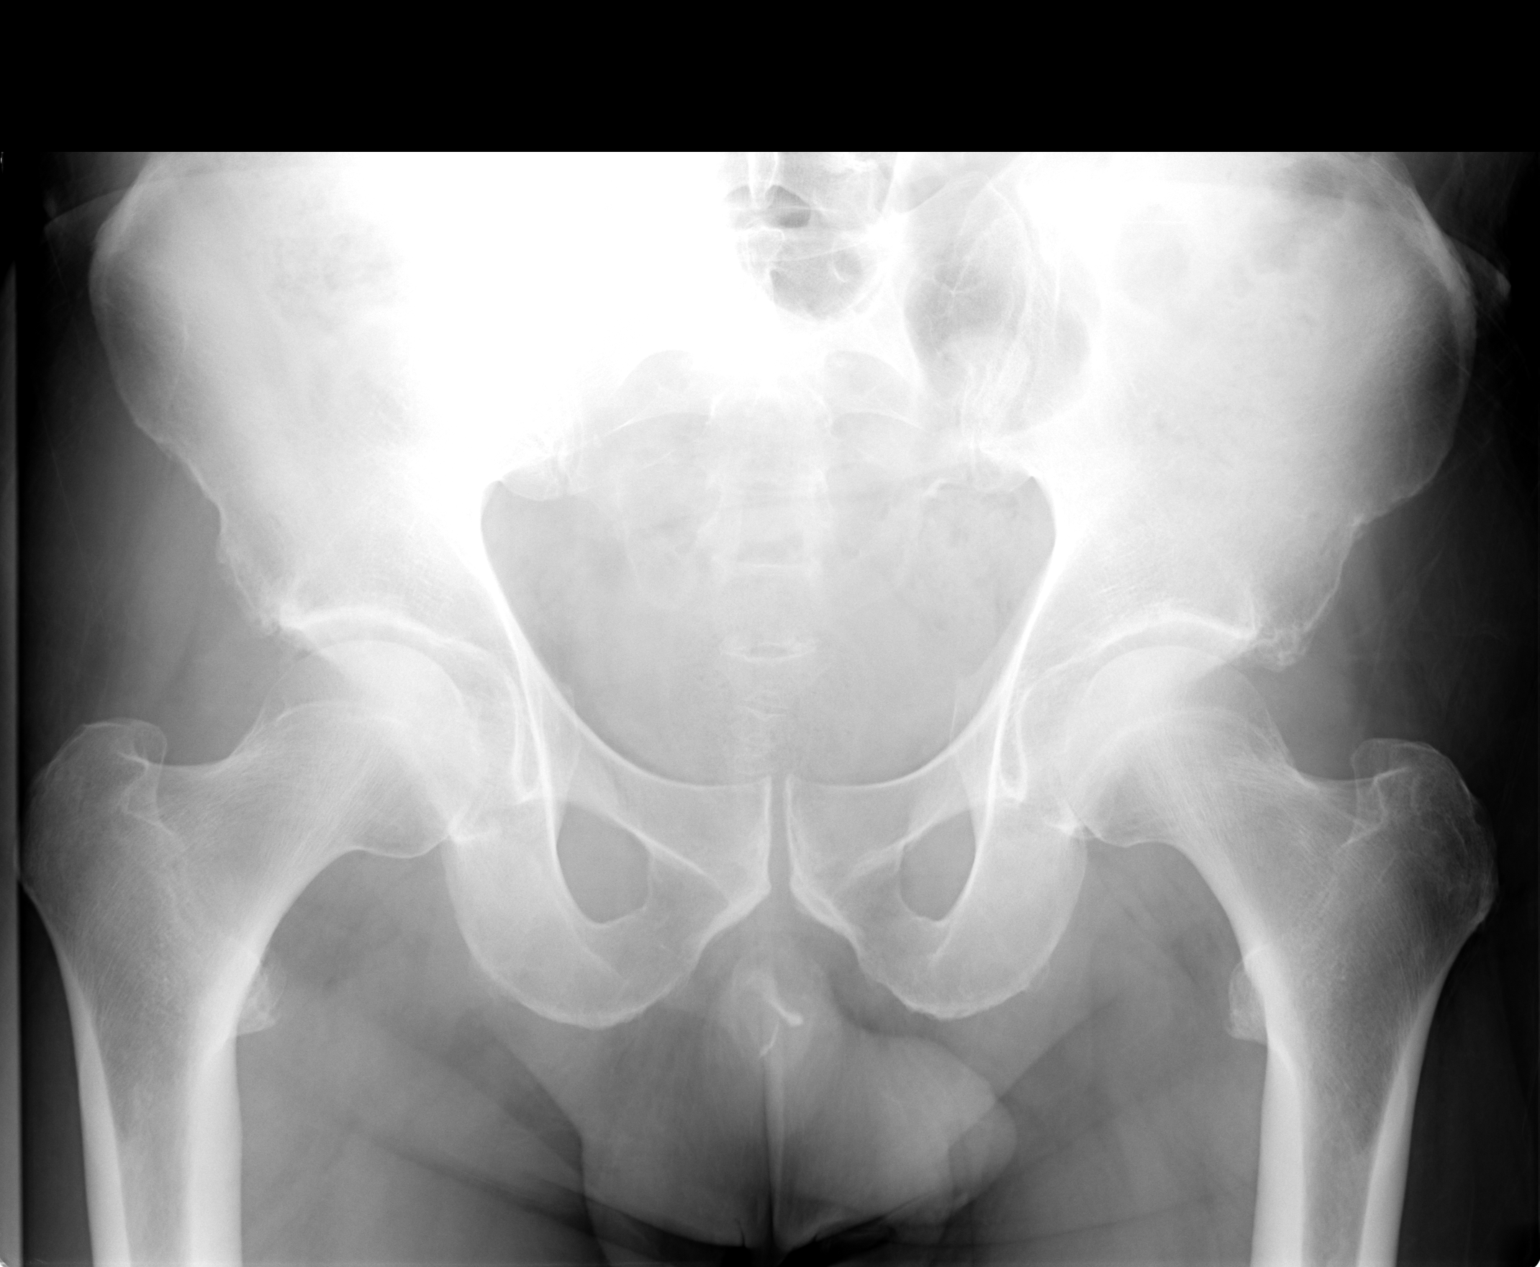

[view not recorded (2 of 3)]
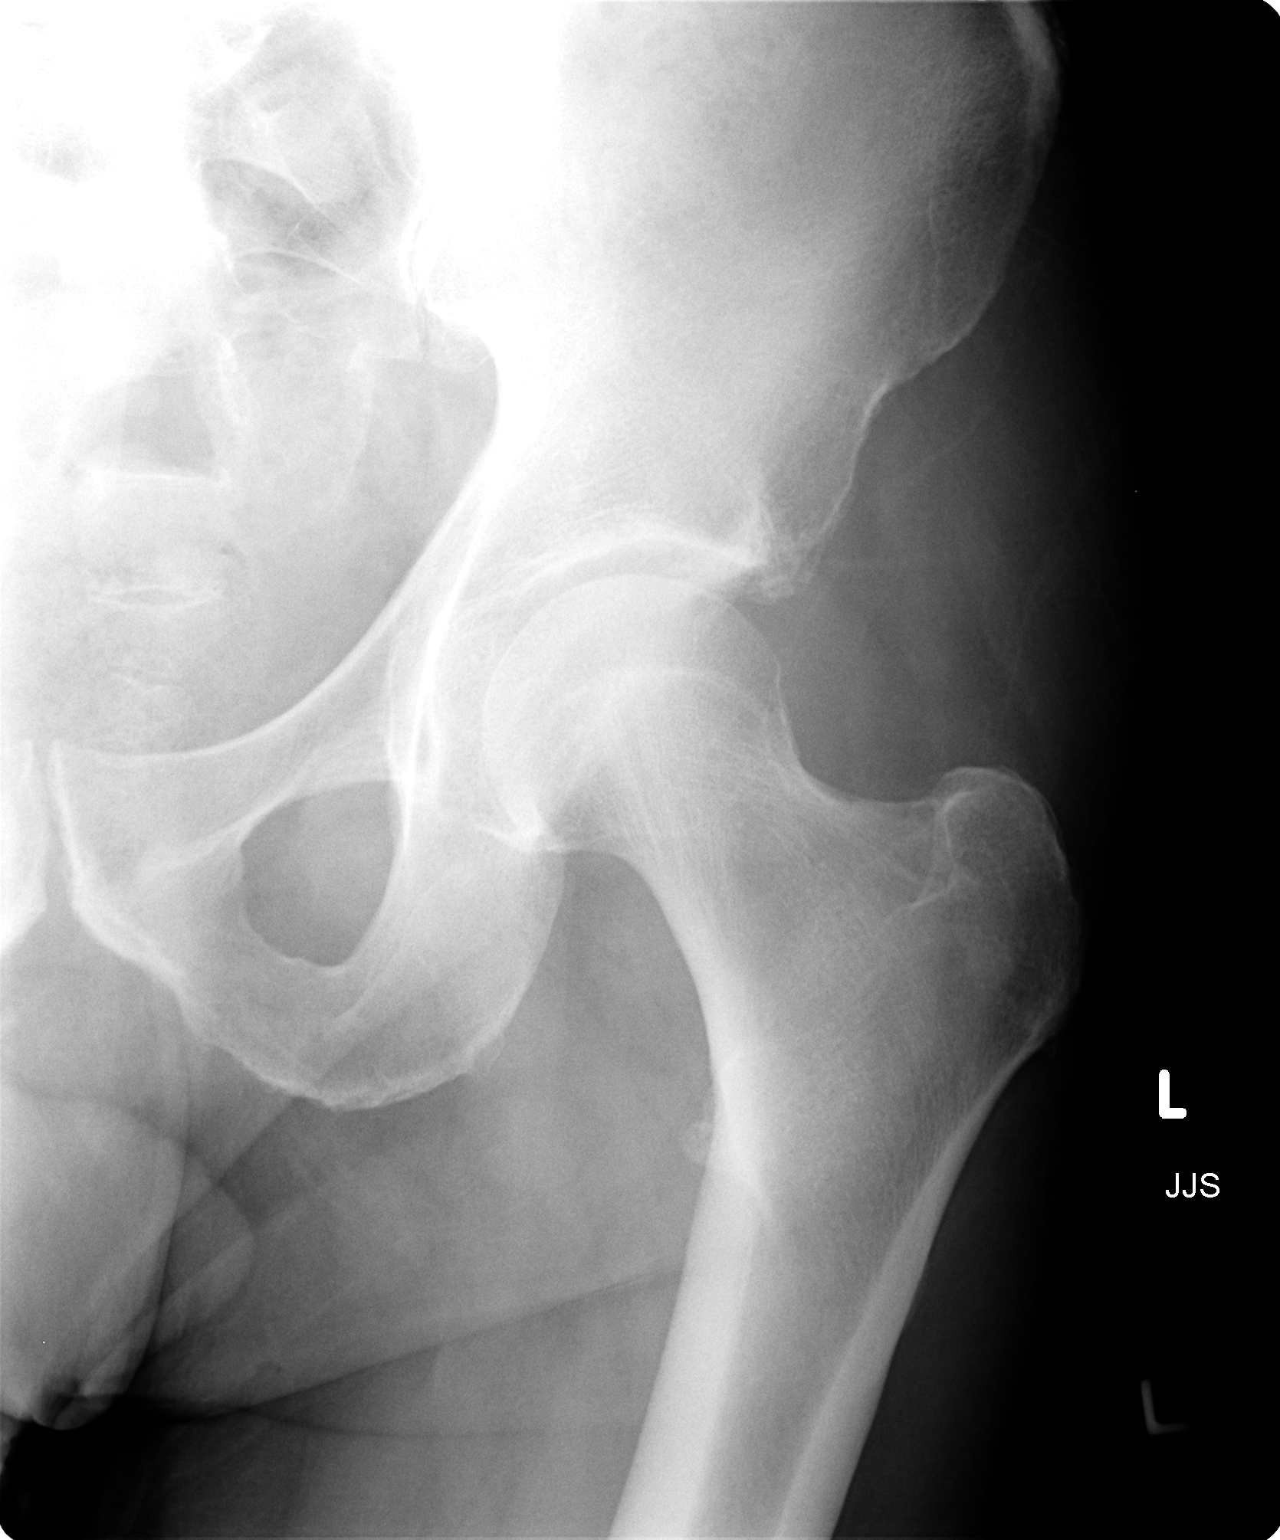

[view not recorded (3 of 3)]
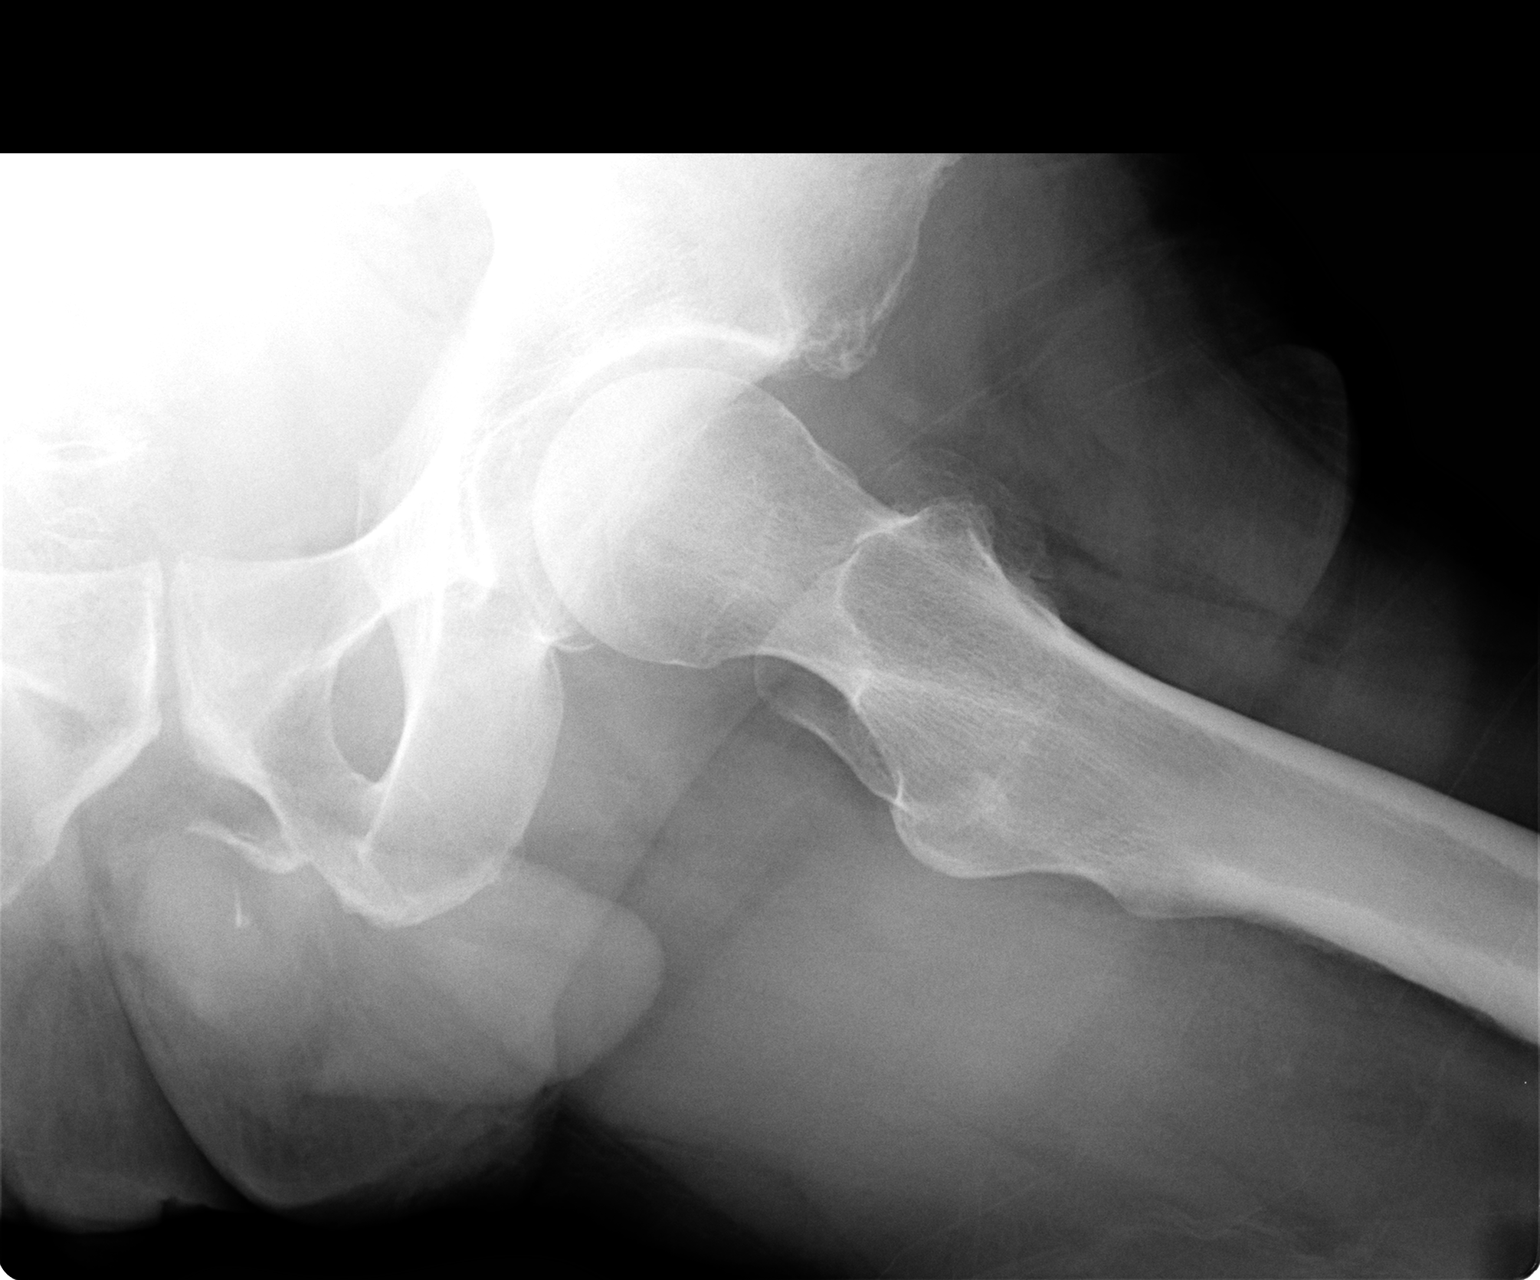

[3 of 3 positions shown; findings below may reference images not displayed]

FINDINGS: The hips are located.  There is no fracture.  The patient
has mild appearing degenerative disease which is unchanged in
appearance.  Radiopaque density projecting centrally at the base of
the penis again noted and stable in appearance.
IMPRESSION: No change in the appearance of mild osteoarthritis.  No acute
finding.

## 2010-05-14 ENCOUNTER — Ambulatory Visit: Payer: Self-pay | Admitting: Endocrinology

## 2010-05-14 DIAGNOSIS — R809 Proteinuria, unspecified: Secondary | ICD-10-CM | POA: Insufficient documentation

## 2010-06-26 ENCOUNTER — Encounter: Payer: Self-pay | Admitting: Endocrinology

## 2010-10-15 ENCOUNTER — Encounter: Payer: Self-pay | Admitting: Endocrinology

## 2010-11-01 ENCOUNTER — Encounter: Payer: Self-pay | Admitting: Endocrinology

## 2010-12-13 ENCOUNTER — Ambulatory Visit
Admission: RE | Admit: 2010-12-13 | Discharge: 2010-12-13 | Payer: Self-pay | Source: Home / Self Care | Attending: Endocrinology | Admitting: Endocrinology

## 2010-12-13 ENCOUNTER — Other Ambulatory Visit: Payer: Self-pay | Admitting: Endocrinology

## 2010-12-13 LAB — BASIC METABOLIC PANEL
BUN: 16 mg/dL (ref 6–23)
CO2: 29 mEq/L (ref 19–32)
Calcium: 9.6 mg/dL (ref 8.4–10.5)
Chloride: 105 mEq/L (ref 96–112)
Creatinine, Ser: 1 mg/dL (ref 0.4–1.5)
GFR: 83.2 mL/min (ref >60.00)
Glucose, Bld: 231 mg/dL — ABNORMAL HIGH (ref 70–99)
Potassium: 4.9 mEq/L (ref 3.5–5.1)
Sodium: 140 mEq/L (ref 135–145)

## 2010-12-13 LAB — CBC WITH DIFFERENTIAL/PLATELET
Basophils Absolute: 0 10*3/uL (ref 0.0–0.1)
Basophils Relative: 0.6 % (ref 0.0–3.0)
Eosinophils Absolute: 0.4 10*3/uL (ref 0.0–0.7)
Eosinophils Relative: 7.9 % — ABNORMAL HIGH (ref 0.0–5.0)
HCT: 39 % (ref 39.0–52.0)
Hemoglobin: 13.3 g/dL (ref 13.0–17.0)
Lymphocytes Relative: 38.8 % (ref 12.0–46.0)
Lymphs Abs: 1.9 10*3/uL (ref 0.7–4.0)
MCHC: 34.1 g/dL (ref 30.0–36.0)
MCV: 90.9 fl (ref 78.0–100.0)
Monocytes Absolute: 0.5 10*3/uL (ref 0.1–1.0)
Monocytes Relative: 10.4 % (ref 3.0–12.0)
Neutro Abs: 2.1 10*3/uL (ref 1.4–7.7)
Neutrophils Relative %: 42.3 % — ABNORMAL LOW (ref 43.0–77.0)
Platelets: 213 10*3/uL (ref 150.0–400.0)
RBC: 4.29 Mil/uL (ref 4.22–5.81)
RDW: 14.5 % (ref 11.5–14.6)
WBC: 5 10*3/uL (ref 4.5–10.5)

## 2010-12-13 LAB — URINALYSIS
Bilirubin Urine: NEGATIVE
Hemoglobin, Urine: NEGATIVE
Leukocytes, UA: NEGATIVE
Nitrite: NEGATIVE
Specific Gravity, Urine: 1.025 (ref 1.000–1.030)
Urine Glucose: >=1000
Urobilinogen, UA: 0.2 (ref 0.0–1.0)
pH: 5 (ref 5.0–8.0)

## 2010-12-13 LAB — HEPATIC FUNCTION PANEL
ALT: 19 U/L (ref 0–53)
AST: 18 U/L (ref 0–37)
Albumin: 4 g/dL (ref 3.5–5.2)
Alkaline Phosphatase: 50 U/L (ref 39–117)
Bilirubin, Direct: 0.1 mg/dL (ref 0.0–0.3)
Total Bilirubin: 0.7 mg/dL (ref 0.3–1.2)
Total Protein: 6.8 g/dL (ref 6.0–8.3)

## 2010-12-13 LAB — LIPID PANEL
Cholesterol: 111 mg/dL (ref 0–200)
HDL: 41.6 mg/dL (ref >39.00)
LDL Cholesterol: 50 mg/dL (ref 0–99)
Total CHOL/HDL Ratio: 3
Triglycerides: 96 mg/dL (ref 0.0–149.0)
VLDL: 19.2 mg/dL (ref 0.0–40.0)

## 2010-12-13 LAB — PSA: PSA: 2.3 ng/mL (ref 0.10–4.00)

## 2010-12-13 LAB — TSH: TSH: 0.51 u[IU]/mL (ref 0.35–5.50)

## 2010-12-20 ENCOUNTER — Other Ambulatory Visit: Payer: Self-pay | Admitting: Endocrinology

## 2010-12-20 ENCOUNTER — Ambulatory Visit
Admission: RE | Admit: 2010-12-20 | Discharge: 2010-12-20 | Payer: Self-pay | Source: Home / Self Care | Attending: Endocrinology | Admitting: Endocrinology

## 2010-12-20 ENCOUNTER — Encounter: Payer: Self-pay | Admitting: Endocrinology

## 2010-12-20 DIAGNOSIS — M25569 Pain in unspecified knee: Secondary | ICD-10-CM | POA: Insufficient documentation

## 2010-12-20 LAB — HEMOGLOBIN A1C: Hgb A1c MFr Bld: 10.8 % — ABNORMAL HIGH (ref 4.6–6.5)

## 2010-12-20 IMAGING — CR DG KNEE 1-2V*R*
2 series · 2 of 2 positions shown · non-contrast
Comparison: None.

CLINICAL DATA: Chronic right knee pain

RIGHT KNEE - 1-2 VIEW

[view not recorded (1 of 2)]
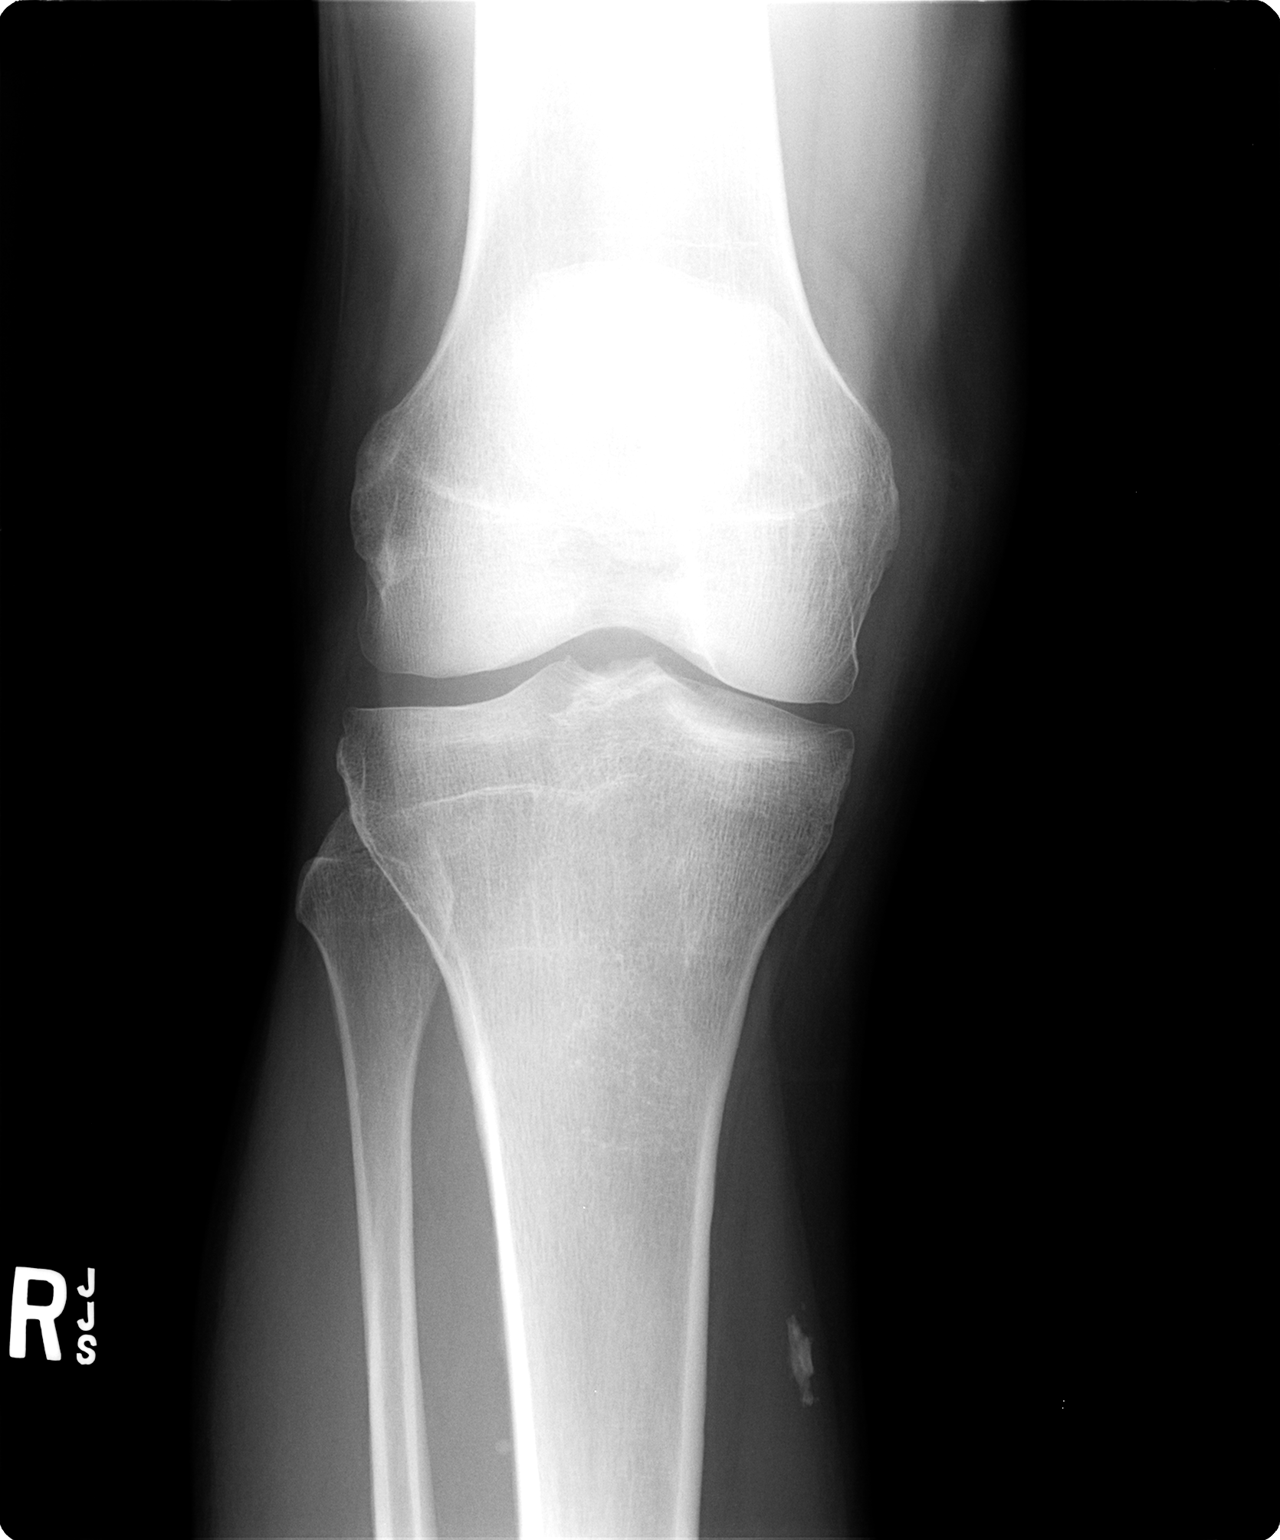

[view not recorded (2 of 2)]
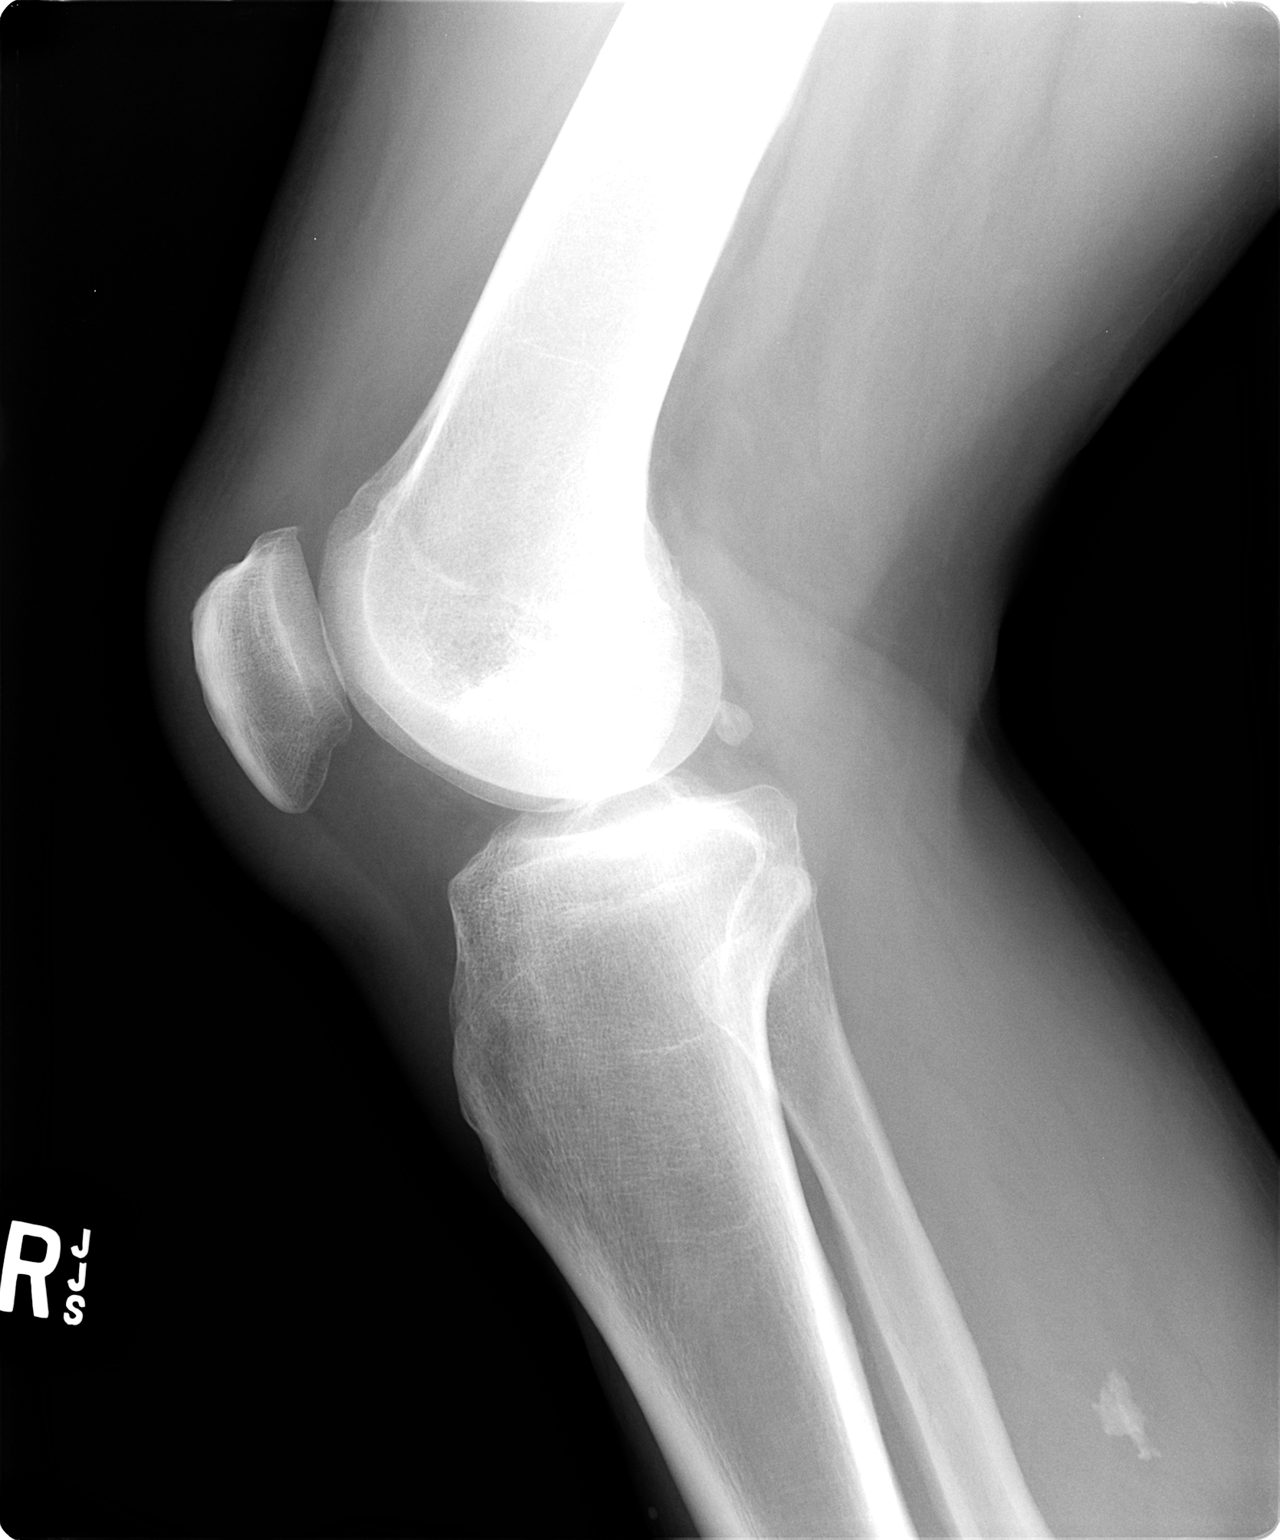

[2 of 2 positions shown; findings below may reference images not displayed]

FINDINGS: The knee joint spaces appear normal.  No fracture is
seen.  No effusion is noted.
IMPRESSION: Negative right knee.

## 2010-12-20 IMAGING — CR DG HIP (WITH OR WITHOUT PELVIS) 2-3V*L*
3 series · 3 of 3 positions shown · non-contrast
Comparison: Pelvis and left hip films of [DATE]

CLINICAL DATA: Chronic left hip pain

LEFT HIP - COMPLETE 2+ VIEW

[view not recorded (1 of 3)]
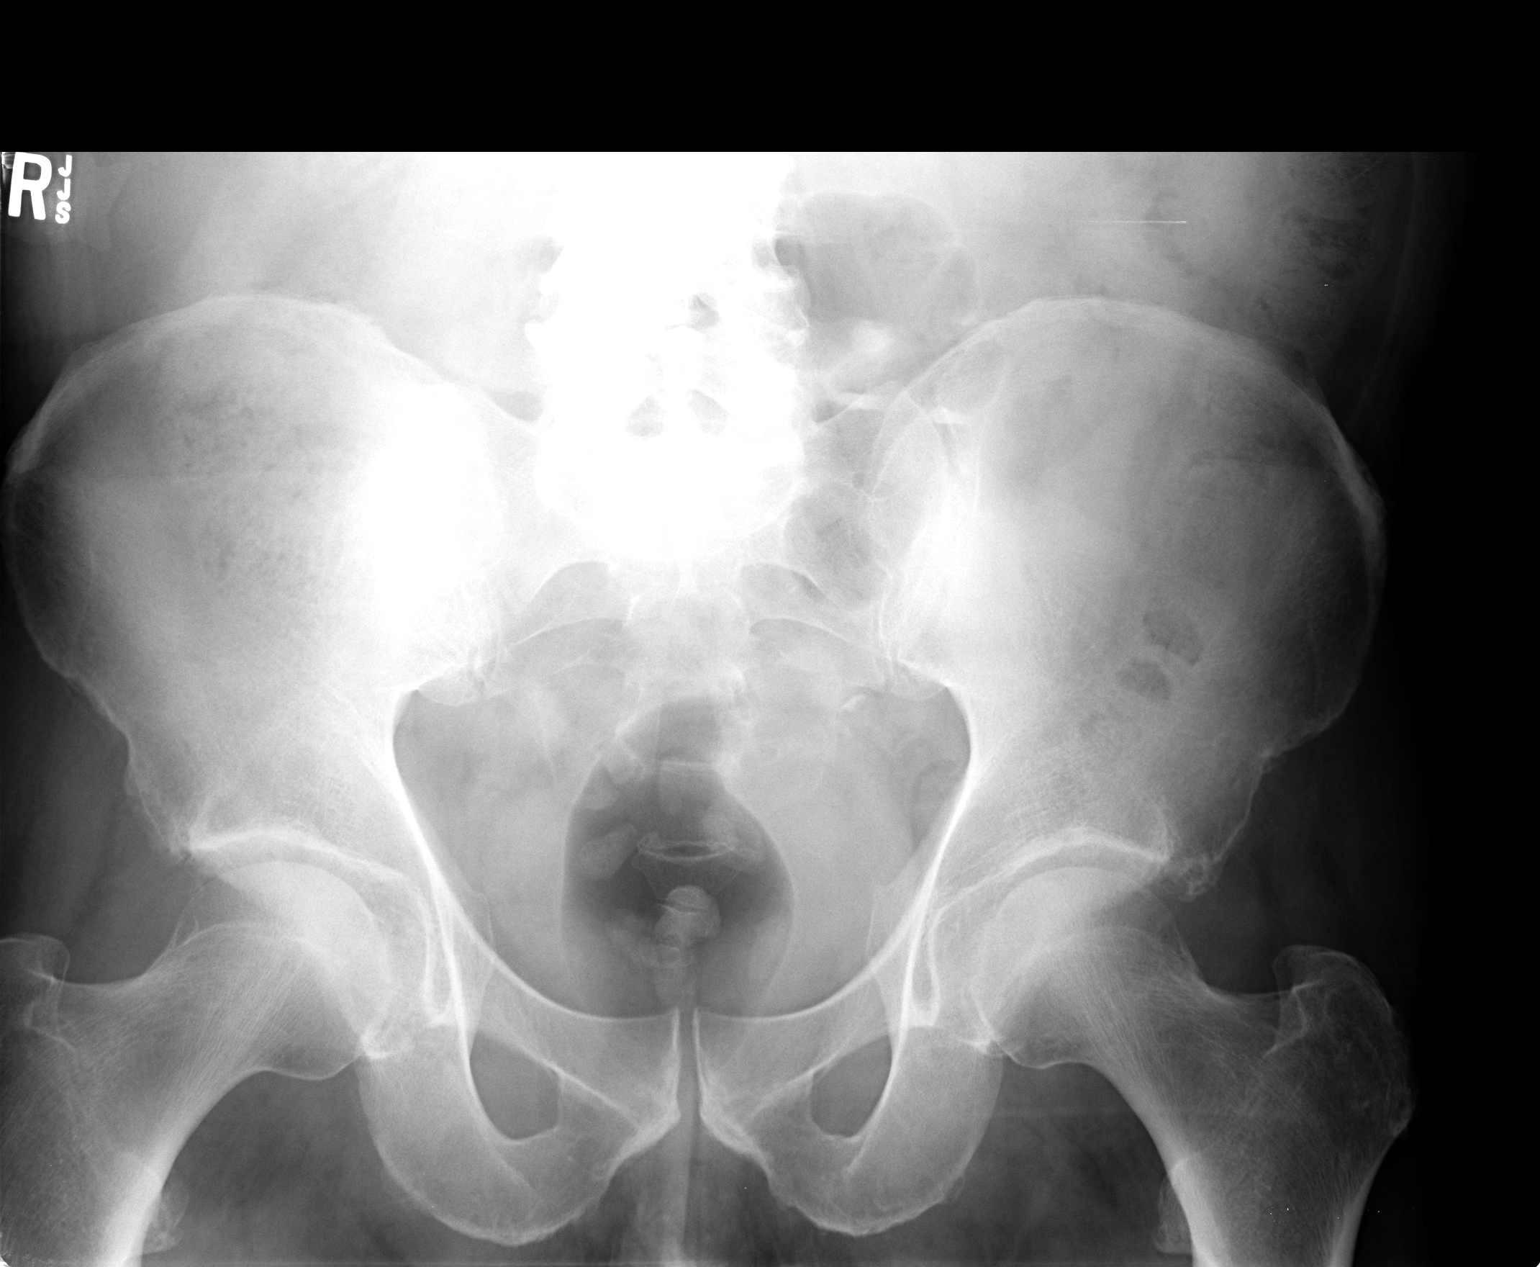

[view not recorded (2 of 3)]
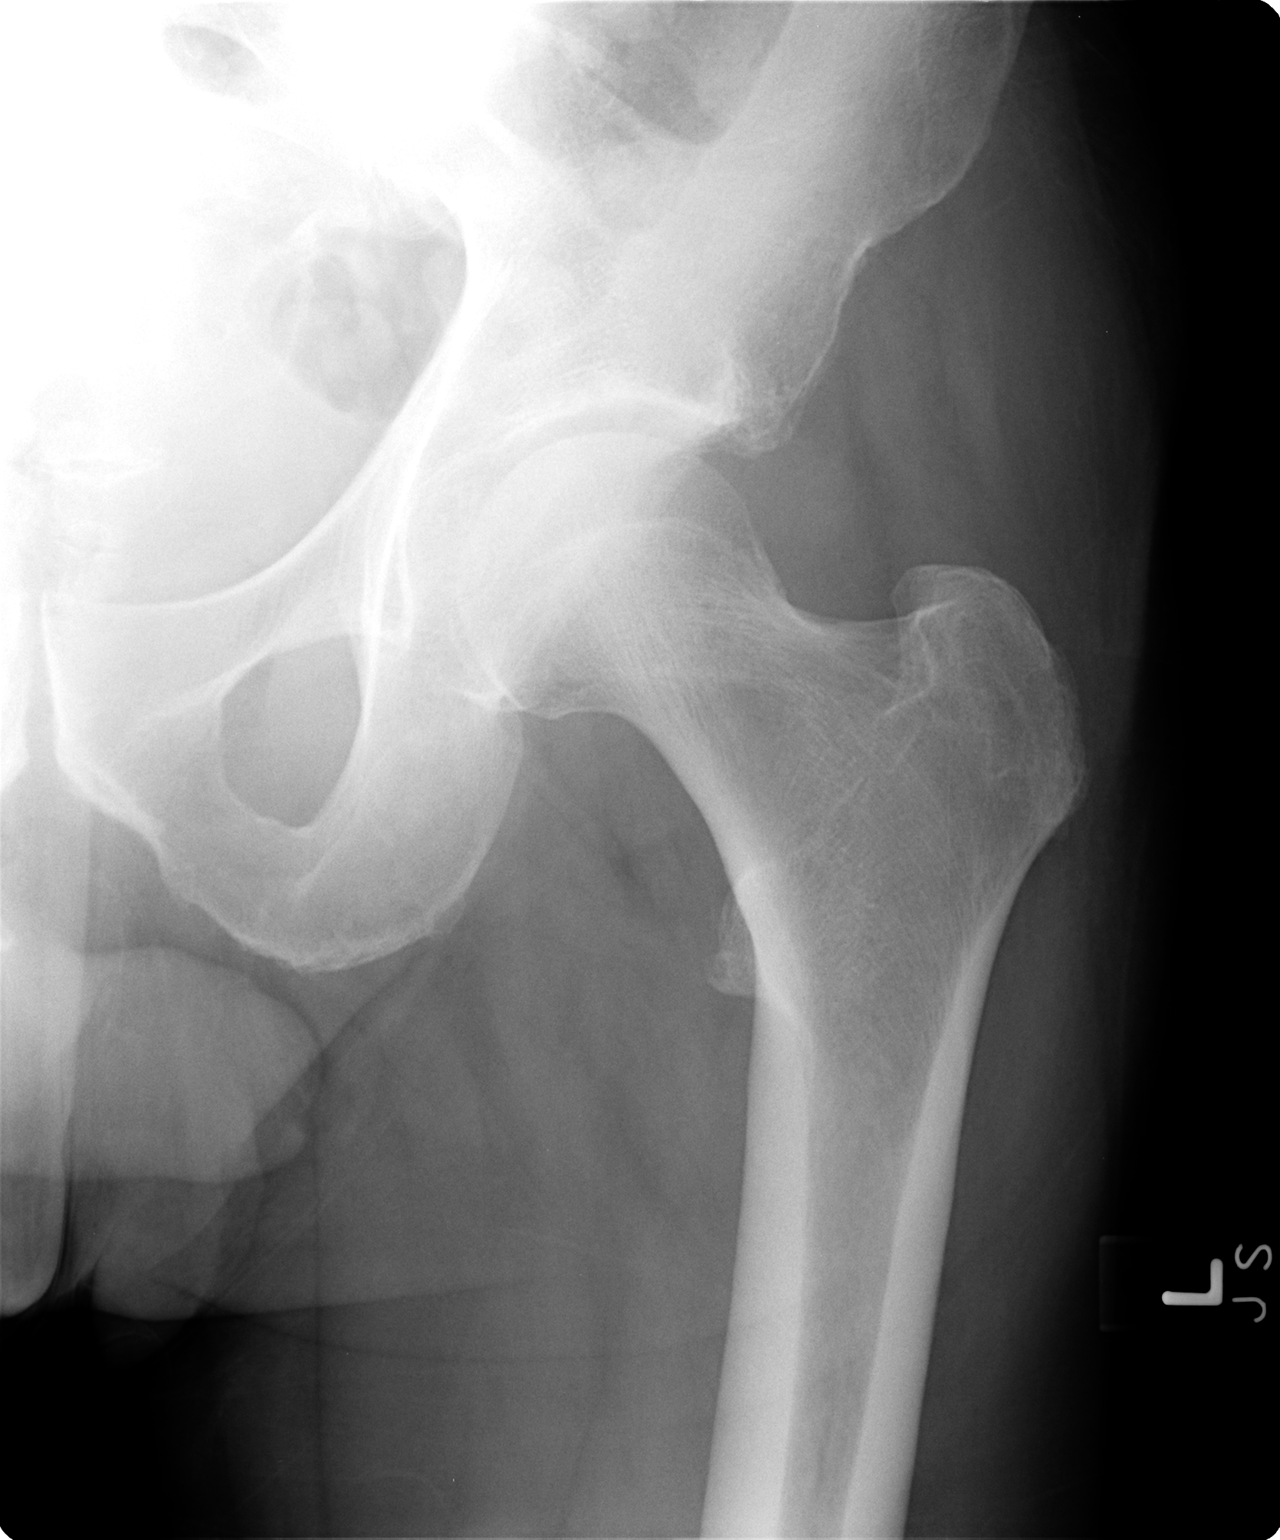

[view not recorded (3 of 3)]
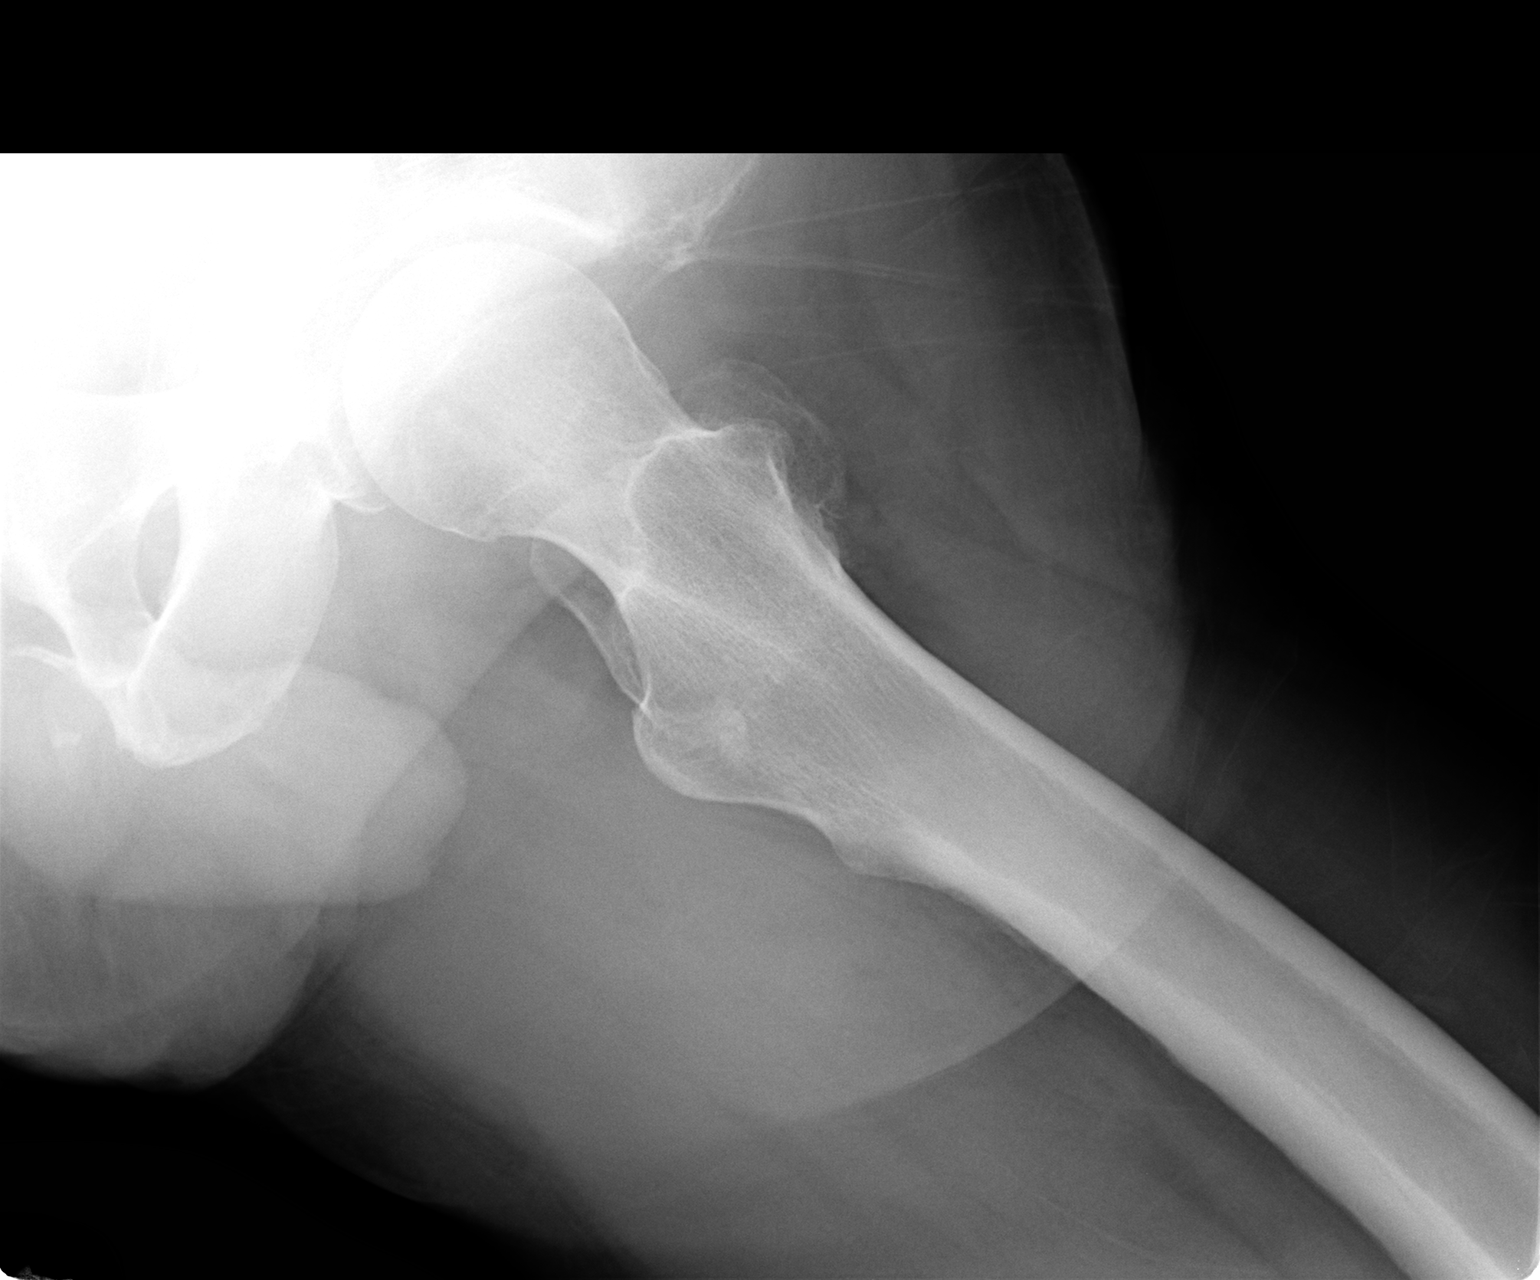

[3 of 3 positions shown; findings below may reference images not displayed]

FINDINGS: There has been no change in hip joint spaces with only
minimal degenerative change of the hips.  The pelvic rami are
intact.  The SI joints appear normal.
IMPRESSION: No significant change in minimal degenerative change of the hips.

## 2010-12-22 LAB — CONVERTED CEMR LAB: Hgb A1c MFr Bld: 7.2 % — ABNORMAL HIGH (ref 4.6–6.0)

## 2010-12-23 ENCOUNTER — Telehealth: Payer: Self-pay | Admitting: Endocrinology

## 2010-12-24 NOTE — Assessment & Plan Note (Signed)
Summary: 6 MTH FU---STC   Vital Signs:  Patient profile:   65 year old male Height:      72 inches (182.88 cm) Weight:      193.50 pounds (87.95 kg) BMI:     26.34 O2 Sat:      96 % on Room air Temp:     97.3 degrees F (36.28 degrees C) oral Pulse rate:   65 / minute Pulse rhythm:   regular BP sitting:   122 / 68  (left arm) Cuff size:   regular  Vitals Entered By: Brenton Grills (May 14, 2010 7:59 AM)  O2 Flow:  Room air CC: 6 mo f/u/pt needs refills for zocor, hyzaar, aciphex, and uroatral/aj   Primary Provider:  Romero Belling, MD  CC:  6 mo f/u/pt needs refills for zocor, hyzaar, aciphex, and and uroatral/aj.  History of Present Illness: no cbg record, but states cbg's are well-controlled "when my diet is good."  it has gone as low as 70.  pt states he feels well in general.    Current Medications (verified): 1)  Actos 45 Mg  Tabs (Pioglitazone Hcl) .... Take 1 By Mouth Qd 2)  Glucophage 1000 Mg  Tabs (Metformin Hcl) .... Take 1 By Mouth Two Times A Day Qd 3)  Adult Aspirin Low Strength 81 Mg  Tbdp (Aspirin) .... Take 1 By Mouth Qd 4)  Januvia 100 Mg  Tabs (Sitagliptin Phosphate) .... Take 1 By Mouth Once Daily 5)  Zocor 80 Mg  Tabs (Simvastatin) .... Take 1 By Mouth Qd 6)  Vitamin E 100 Unit Caps (Vitamin E) .... Take 1 By Mouth Qd 7)  Hyzaar 100-12.5 Mg Tabs (Losartan Potassium-Hctz) .... Qd 8)  Glimepiride 1 Mg Tabs (Glimepiride) .... Take 1 Tablet By Mouth Once A Day 9)  Aciphex 20 Mg Tbec (Rabeprazole Sodium) .... Take 1 Tablet By Mouth Once A Day 10)  Uroxatral 10 Mg Xr24h-Tab (Alfuzosin Hcl) .Marland Kitchen.. 1 Qd  Allergies (verified): 1)  ! 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    Physical Exam  General:  normal appearance.   Pulses:  dorsalis pedis intact bilat.   Extremities:  no deformity.  no ulcer on the feet.  feet are of normal color and temp.  trace right pedal edema and trace left pedal edema.   Neurologic:  cn 2-12 grossly intact.   readily moves all 4's.   sensation is intact to touch on the feet  Additional Exam:  Hemoglobin A1C       [H]  8.0 %    Impression & Recommendations:  Problem # 1:  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 (ICD-250.40) needs increased rx  Medications Added to Medication List This Visit: 1)  Simvastatin 40 Mg Tabs (Simvastatin) .Marland Kitchen.. 1 at bedtime 2)  Glimepiride 2 Mg Tabs (Glimepiride) .Marland Kitchen.. 1 tab each am  Other Orders: TLB-A1C / Hgb A1C (Glycohemoglobin) (83036-A1C) Est. Patient Level III (21308)  Patient Instructions: 1)  blood tests are being ordered for you today.  please call [phone] to hear your test results. 2)  pending the test results, please continue the same medications for now. 3)  Please schedule a physical appointment in 6 months. 4)  (update: i left message on phone-tree:  increase glimepiride to 2 mg each am). Prescriptions: GLIMEPIRIDE 2 MG TABS (GLIMEPIRIDE) 1 tab each am  #30 x 11   Entered and Authorized by:   Minus Breeding MD   Signed by:   Minus Breeding MD on 05/14/2010   Method used:   Electronically to        [facility] [phone]* (retail)       [address] [place], [place]  [address]       Ph: [phone]       Fax: [phone]   RxID:   [id number] SIMVASTATIN 40 MG TABS (SIMVASTATIN) 1 at bedtime  #30 x 11   Entered and Authorized by:   Minus Breeding MD   Signed by:   Minus Breeding MD on 05/14/2010   Method used:   Electronically to        [facility] [phone]* (retail)       [address] [place], [place]  [address]       Ph: [phone]       Fax: [phone]   RxID:    [id number]

## 2010-12-24 NOTE — Letter (Signed)
Summary: Primary Care Appointment Letter  [facility]  [address] [place], [place] [address]   Phone: [phone]  Fax: [phone]    06/26/2010 MRN: [id number]  [facility] [address] [place], [place]  [address]  Dear [name],   Your Primary Care Physician Minus Breeding MD has indicated that:    _______it is time to schedule an appointment.    _______you missed your appointment on______ and need to call and          reschedule.    _______you need to have lab work done.    _______you need to schedule an appointment discuss lab or test results.    ___X____you need to call to reschedule your appointment that is                       scheduled on November 12, 2010 with Dr. Everardo All.  Please call the office.     Please call our office as soon as possible. Our phone number is [phone]          _________. Please press option 1. Our office is open 8a-12noon and 1p-5p, Monday through Friday.     Thank you,    [place] [facility] Scheduler

## 2010-12-24 NOTE — Letter (Signed)
Summary: Generic Letter  [facility]  [address] [place], [place] [address]   Phone: [phone]  Fax: [phone]    05/14/2010  RYNE MCTIGUE [address] [place], [place]  [address]  Dear Mr. Pendelton,  You had  "wellness" visit here on 11/13/2009    Sincerely,   Romero Belling MD

## 2010-12-24 NOTE — Letter (Signed)
Summary: Primary Care Appointment Letter  [facility]  [address] [place], [place] [address]   Phone: [phone]  Fax: [phone]    11/01/2010 MRN: [id number]  [facility] [address] [place], [place]  [address]  Dear Mr. Robillard,   Your Primary Care Physician Minus Breeding MD has indicated that:    _______it is time to schedule an appointment.    _______you missed your appointment on______ and need to call and          reschedule.    _______you need to have lab work done.    _______you need to schedule an appointment discuss lab or test results.    ___X____you need to call to reschedule your appointment that is                       scheduled on Dec. 20, 2011 with Dr. Everardo All.  Please call the office to schedule.    Please call our office as soon as possible. Our phone number is [phone]. Please press option 1. Our office is open 8a-12noon and 1p-5p, Monday through Friday.     Thank you,    [place] Scheduler

## 2010-12-24 NOTE — Letter (Signed)
Summary: Proof of Physicial  Proof of Physicial   Imported By: [place] 10/21/2010 08:32:36  _____________________________________________________________________  External Attachment:    Type:   Image     Comment:   External Document

## 2011-01-01 NOTE — Assessment & Plan Note (Signed)
Summary: cpx-lb   Vital Signs:  Patient profile:   65 year old male Height:      72 inches (182.88 cm) Weight:      193 pounds (87.73 kg) BMI:     26.27 O2 Sat:      97 % on Room air Temp:     98.4 degrees F (36.89 degrees C) oral Pulse rate:   73 / minute Pulse rhythm:   regular BP sitting:   124 / 72  (left arm) Cuff size:   regular  Vitals Entered By: Brenton Grills CMA Duncan Dull) (December 20, 2010 1:24 PM)  O2 Flow:  Room air CC: CPX/pt is no longer taking Uroxatral/aj Is Patient Diabetic? Yes   Primary Provider:  Romero Belling, MD  CC:  CPX/pt is no longer taking Uroxatral/aj.  History of Present Illness: here for regular wellness examination.  He's feeling pretty well in general, and does not smoke.   Current Medications (verified): 1)  Actos 45 Mg  Tabs (Pioglitazone Hcl) .... Take 1 By Mouth Qd 2)  Glucophage 1000 Mg  Tabs (Metformin Hcl) .... Take 1 By Mouth Two Times A Day Qd 3)  Adult Aspirin Low Strength 81 Mg  Tbdp (Aspirin) .... Take 1 By Mouth Qd 4)  Januvia 100 Mg  Tabs (Sitagliptin Phosphate) .... Take 1 By Mouth Once Daily 5)  Vitamin E 100 Unit Caps (Vitamin E) .... Take 1 By Mouth Qd 6)  Hyzaar 100-12.5 Mg Tabs (Losartan Potassium-Hctz) .... Qd 7)  Aciphex 20 Mg Tbec (Rabeprazole Sodium) .... Take 1 Tablet By Mouth Once A Day 8)  Uroxatral 10 Mg Xr24h-Tab (Alfuzosin Hcl) .Marland Kitchen.. 1 Qd 9)  Simvastatin 40 Mg Tabs (Simvastatin) .Marland Kitchen.. 1 At Bedtime 10)  Glimepiride 2 Mg Tabs (Glimepiride) .Marland Kitchen.. 1 Tab Each Am  Allergies (verified): 1)  ! Septra  Family History: Reviewed history from 11/28/2008 and no changes required. brother has prostate cancer Family History of Diabetes: Mother, 2 brothers Family History of Heart Disease: brother  Social History: Reviewed history from 12/28/2008 and no changes required. married works Network engineer Daily Caffeine Use-1-2 cups daily Illicit Drug Use - no Patient is a former smoker. -used to smoke occasional  cigar Alcohol Use - yes-occasional  Review of Systems       he has lost a few lbs, due to his efforts.  he has intermittent headache  Physical Exam  General:  normal appearance.   Head:  head: no deformity eyes: no periorbital swelling, no proptosis external nose and ears are normal mouth: no lesion seen Neck:  Supple without thyroid enlargement or tenderness.  Lungs:  Clear to auscultation bilaterally. Normal respiratory effort.  Heart:  Regular rate and rhythm without murmurs or gallops noted. Normal S1,S2.   Abdomen:  abdomen is soft, nontender.  no hepatosplenomegaly.   not distended.  no hernia  Rectal:  (declined) Prostate:  (declined) Pulses:  dorsalis pedis intact bilat.  no carotid bruit  Neurologic:  cn 2-12 grossly intact.   readily moves all 4's.    Skin:  normal texture and temp.  no rash.  not diaphoretic  Cervical Nodes:  No significant adenopathy.  Psych:  Alert and cooperative; normal mood and affect; normal attention span and concentration.   Additional Exam:  SEPARATE EVALUATION FOLLOWS--EACH PROBLEM HERE IS NEW, NOT RESPONDING TO TREATMENT, OR POSES SIGNIFICANT RISK TO THE PATIENT'S HEALTH: HISTORY OF THE PRESENT ILLNESS: pt states few years of moderate pain at the left hip, and assoc right knee  pain.   he ran out of dm meds approx 3 days ago.   PAST MEDICAL HISTORY reviewed and up to date today REVIEW OF SYSTEMS: denies numbness PHYSICAL EXAMINATION: no deformity.  no ulcer on the feet.  feet are of normal color and temp.  no edema muscle bulk and strength are grossly normal.  no obvious joint swelling.  gait is normal and steady.  the right knee and left hip are normal to my exam sensation is intact to touch on the feet LAB/XRAY RESULTS: Hemoglobin A1C       [H]  10.8 %     also see x ray results IMPRESSION: arthralgias.  new problem dm, poor control PLAN: see instruction sheet   Impression & Recommendations:  Problem # 1:  ROUTINE GENERAL  MEDICAL EXAM@HEALTH  CARE FACL (ICD-V70.0)  Medications Added to Medication List This Visit: 1)  Glimepiride 4 Mg Tabs (Glimepiride) .Marland Kitchen.. 1 tab two times a day  Other Orders: EKG w/ Interpretation (93000) Rehabilitation Referral (Rehab) TLB-A1C / Hgb A1C (Glycohemoglobin) (83036-A1C) T-Hip Comp Left Min 2-views (73510TC) T-Knee Right 2 view (73560TC) Est. Patient Level IV (75643) Est. Patient 40-64 years ([address])   Patient Instructions: 1)  blood tests, and x rays, are being ordered for you today.  please call [phone] to hear your test results. 2)  pending the test results, please increase glimepiride to 4 mg two times a day. 3)  refer pmr group.  you will be called with a day and time for an appointment. 4)  please consider these measures for your health:  minimize alcohol.  do not use tobacco products.  have a colonoscopy at least every 10 years from age 20.  keep firearms safely stored.  always use seat belts.  have working smoke alarms in your home.  see an eye doctor and dentist regularly.  never drive under the influence of alcohol or drugs (including prescription drugs).  those with fair skin should take precautions against the sun. 5)  please let me know what your wishes would be, if artificial life support measures should become necessary.  it is critically important to prevent falling down (keep floor areas well-lit, dry, and free of loose objects) 6)  Please schedule a follow-up appointment in 3 months. 7)  (update: we discussed code status.  pt requests full code, but would not want to be started or maintained on artificial life-support measures if there was not a reasonable chance of recovery) 8)  (update: i left message on phone-tree:  rx as we discussed.  go to lab in 3-4 weeks for fructosamine 250.02) Prescriptions: GLIMEPIRIDE 4 MG TABS (GLIMEPIRIDE) 1 tab two times a day  #60 x 11   Entered and Authorized by:   Minus Breeding MD   Signed by:   Minus Breeding MD on  12/20/2010   Method used:   Electronically to        [facility] [phone]* (retail)       [address] [place], [place]  [address]       Ph: [phone]       Fax: [phone]   RxID:   [id number]    Orders Added: 1)  EKG w/ Interpretation [93000] 2)  Rehabilitation Referral [Rehab] 3)  TLB-A1C / Hgb A1C (Glycohemoglobin) [83036-A1C] 4)  T-Hip Comp Left Min 2-views [73510TC] 5)  T-Knee Right 2 view [73560TC] 6)  Est. Patient Level IV [06269] 7)  Est. Patient 40-64  years [phone]

## 2011-01-01 NOTE — Progress Notes (Signed)
Summary: rx refill req  Phone Note Refill Request Message from:  Fax from Pharmacy on December 23, 2010 9:50 AM  Refills Requested: Medication #1:  ACTOS 45 MG  TABS take 1 by mouth qd   Dosage confirmed as above?Dosage Confirmed   Last Refilled: 11/07/2010  Medication #2:  JANUVIA 100 MG  TABS take 1 by mouth once daily   Dosage confirmed as above?Dosage Confirmed   Last Refilled: 11/07/2010  Medication #3:  GLUCOPHAGE 1000 MG  TABS take 1 by mouth two times a day qd   Dosage confirmed as above?Dosage Confirmed   Last Refilled: 11/07/2010  Medication #4:  ACIPHEX 20 MG TBEC Take 1 tablet by mouth once a day   Dosage confirmed as above?Dosage Confirmed   Last Refilled: 11/07/2010  Method Requested: Electronic Next Appointment Scheduled: none Initial call taken by: Brenton Grills CMA Duncan Dull),  December 23, 2010 9:51 AM    Prescriptions: ACIPHEX 20 MG TBEC (RABEPRAZOLE SODIUM) Take 1 tablet by mouth once a day  #30 x 11   Entered by:   Brenton Grills CMA (AAMA)   Authorized by:   Minus Breeding MD   Signed by:   Brenton Grills CMA (AAMA) on 12/23/2010   Method used:   Electronically to        [facility] [phone]* (retail)       [address] [place], [place]  [address]       Ph: [phone]       Fax: [phone]   RxID:   [id number] JANUVIA 100 MG  TABS (SITAGLIPTIN PHOSPHATE) take 1 by mouth once daily  #30 x 11   Entered by:   Brenton Grills CMA (AAMA)   Authorized by:   Minus Breeding MD   Signed by:   Brenton Grills CMA (AAMA) on 12/23/2010   Method used:   Electronically to        [facility] [phone]* (retail)       [address] [place], [place]  [address]       Ph: [phone]       Fax: [phone]   RxID:   [id number] GLUCOPHAGE 1000 MG  TABS (METFORMIN HCL) take 1 by mouth two times a day qd  #60 x 11   Entered by:   Brenton Grills CMA (AAMA)   Authorized by:   Minus Breeding MD   Signed by:   Brenton Grills CMA  (AAMA) on 12/23/2010   Method used:   Electronically to        [facility] [phone]* (retail)       [address] [place], [place]  [address]       Ph: [phone]       Fax: [phone]   RxID:   [id number] ACTOS 45 MG  TABS (PIOGLITAZONE HCL) take 1 by mouth qd  #30 x 11   Entered by:   Brenton Grills CMA (AAMA)   Authorized by:   Minus Breeding MD   Signed by:   Brenton Grills CMA (AAMA) on 12/23/2010   Method used:   Electronically to        [facility] [phone]* (retail)       [address] [place], [place]  [address]       Ph: [phone]  Fax: [phone]   RxID:   [id number]

## 2011-01-02 ENCOUNTER — Ambulatory Visit: Payer: 59 | Attending: Endocrinology | Admitting: Rehabilitative and Restorative Service Providers"

## 2011-01-02 DIAGNOSIS — M545 Low back pain, unspecified: Secondary | ICD-10-CM | POA: Insufficient documentation

## 2011-01-02 DIAGNOSIS — M2560 Stiffness of unspecified joint, not elsewhere classified: Secondary | ICD-10-CM | POA: Insufficient documentation

## 2011-01-02 DIAGNOSIS — IMO0001 Reserved for inherently not codable concepts without codable children: Secondary | ICD-10-CM | POA: Insufficient documentation

## 2011-01-06 ENCOUNTER — Encounter: Payer: Self-pay | Admitting: Endocrinology

## 2011-01-07 ENCOUNTER — Ambulatory Visit: Payer: 59 | Admitting: Physical Therapy

## 2011-01-09 ENCOUNTER — Ambulatory Visit: Payer: 59 | Admitting: Physical Therapy

## 2011-01-15 ENCOUNTER — Ambulatory Visit: Payer: 59 | Admitting: Physical Therapy

## 2011-01-15 NOTE — Miscellaneous (Signed)
Summary: PT/[facility]  PT/[place]   Imported By: Sherian Rein 01/09/2011 12:48:48  _____________________________________________________________________  External Attachment:    Type:   Image     Comment:   External Document

## 2011-01-21 ENCOUNTER — Ambulatory Visit: Payer: 59 | Admitting: Physical Therapy

## 2011-01-23 ENCOUNTER — Ambulatory Visit: Payer: 59 | Attending: Endocrinology | Admitting: Physical Therapy

## 2011-01-23 DIAGNOSIS — IMO0001 Reserved for inherently not codable concepts without codable children: Secondary | ICD-10-CM | POA: Insufficient documentation

## 2011-01-23 DIAGNOSIS — M545 Low back pain, unspecified: Secondary | ICD-10-CM | POA: Insufficient documentation

## 2011-01-23 DIAGNOSIS — M2560 Stiffness of unspecified joint, not elsewhere classified: Secondary | ICD-10-CM | POA: Insufficient documentation

## 2011-01-28 ENCOUNTER — Ambulatory Visit: Payer: 59 | Admitting: Rehabilitative and Restorative Service Providers"

## 2011-01-30 ENCOUNTER — Encounter: Payer: 59 | Admitting: Physical Therapy

## 2011-03-10 LAB — GLUCOSE, CAPILLARY: Glucose-Capillary: 176 mg/dL — ABNORMAL HIGH (ref 70–99)

## 2011-04-17 DIAGNOSIS — M720 Palmar fascial fibromatosis [Dupuytren]: Secondary | ICD-10-CM | POA: Insufficient documentation

## 2011-06-09 ENCOUNTER — Other Ambulatory Visit: Payer: Self-pay | Admitting: *Deleted

## 2011-06-09 MED ORDER — SIMVASTATIN 40 MG PO TABS: 40.0000 mg | ORAL_TABLET | Freq: Every day | ORAL | Status: DC

## 2011-06-09 NOTE — Telephone Encounter (Signed)
R'cd fax from [facility] for refill of Simvastatin.  Last OV-12/20/2010  Last Filled-05/03/2011

## 2011-12-31 ENCOUNTER — Other Ambulatory Visit: Payer: Self-pay | Admitting: *Deleted

## 2011-12-31 MED ORDER — SIMVASTATIN 40 MG PO TABS: 40.0000 mg | ORAL_TABLET | Freq: Every day | ORAL | Status: DC

## 2011-12-31 MED ORDER — RABEPRAZOLE SODIUM 20 MG PO TBEC: 20.0000 mg | DELAYED_RELEASE_TABLET | Freq: Every day | ORAL | Status: DC

## 2011-12-31 MED ORDER — METFORMIN HCL 1000 MG PO TABS: 1000.0000 mg | ORAL_TABLET | Freq: Two times a day (BID) | ORAL | Status: DC

## 2011-12-31 MED ORDER — PIOGLITAZONE HCL 45 MG PO TABS: 45.0000 mg | ORAL_TABLET | Freq: Every day | ORAL | Status: DC

## 2011-12-31 MED ORDER — GLIMEPIRIDE 4 MG PO TABS: 4.0000 mg | ORAL_TABLET | Freq: Two times a day (BID) | ORAL | Status: DC

## 2011-12-31 NOTE — Telephone Encounter (Signed)
R'cd fax from [facility] for refills of generic Actos, Glimepiride, Aciphex, Simvastatin, and Metformin  Last OV-11/2010

## 2012-01-01 ENCOUNTER — Other Ambulatory Visit: Payer: Self-pay | Admitting: *Deleted

## 2012-01-01 MED ORDER — GLIMEPIRIDE 4 MG PO TABS: 4.0000 mg | ORAL_TABLET | Freq: Two times a day (BID) | ORAL | Status: DC

## 2012-01-01 NOTE — Telephone Encounter (Signed)
R'cd fax from [facility] to correct refill of Glimepiride. Rx sent in for 30, pt takes 1 bid. Rx sent in for 60 tablets.

## 2012-01-29 ENCOUNTER — Telehealth: Payer: Self-pay | Admitting: *Deleted

## 2012-01-29 DIAGNOSIS — E1129 Type 2 diabetes mellitus with other diabetic kidney complication: Secondary | ICD-10-CM

## 2012-01-29 DIAGNOSIS — Z0000 Encounter for general adult medical examination without abnormal findings: Secondary | ICD-10-CM

## 2012-01-29 DIAGNOSIS — Z0389 Encounter for observation for other suspected diseases and conditions ruled out: Secondary | ICD-10-CM

## 2012-01-29 NOTE — Telephone Encounter (Signed)
Labs placed into Epic for upcoming CPX appointment.  

## 2012-01-29 NOTE — Telephone Encounter (Signed)
Message copied by Carin Primrose on Thu Jan 29, 2012  3:17 PM ------      Message from: Newell Coral      Created: Mon Jan 26, 2012  2:28 PM      Regarding: cpe sch, needs labs       The pt scheduled his cpe and is hoping to get labs done.             Thanks

## 2012-02-09 ENCOUNTER — Other Ambulatory Visit (INDEPENDENT_AMBULATORY_CARE_PROVIDER_SITE_OTHER): Payer: 59

## 2012-02-09 ENCOUNTER — Telehealth: Payer: Self-pay | Admitting: Endocrinology

## 2012-02-09 DIAGNOSIS — N058 Unspecified nephritic syndrome with other morphologic changes: Secondary | ICD-10-CM

## 2012-02-09 DIAGNOSIS — E1129 Type 2 diabetes mellitus with other diabetic kidney complication: Secondary | ICD-10-CM

## 2012-02-09 DIAGNOSIS — Z0389 Encounter for observation for other suspected diseases and conditions ruled out: Secondary | ICD-10-CM

## 2012-02-09 DIAGNOSIS — Z0000 Encounter for general adult medical examination without abnormal findings: Secondary | ICD-10-CM

## 2012-02-09 LAB — HEPATIC FUNCTION PANEL
Albumin: 4 g/dL (ref 3.5–5.2)
Alkaline Phosphatase: 40 U/L (ref 39–117)
Total Bilirubin: 0.6 mg/dL (ref 0.3–1.2)

## 2012-02-09 LAB — CBC WITH DIFFERENTIAL/PLATELET
Eosinophils Absolute: 0.3 10*3/uL (ref 0.0–0.7)
Eosinophils Relative: 6.6 % — ABNORMAL HIGH (ref 0.0–5.0)
HCT: 40.4 % (ref 39.0–52.0)
Lymphs Abs: 2 10*3/uL (ref 0.7–4.0)
MCHC: 33.6 g/dL (ref 30.0–36.0)
MCV: 93 fl (ref 78.0–100.0)
Monocytes Absolute: 0.5 10*3/uL (ref 0.1–1.0)
Neutrophils Relative %: 39 % — ABNORMAL LOW (ref 43.0–77.0)
Platelets: 147 10*3/uL — ABNORMAL LOW (ref 150.0–400.0)
RDW: 14.8 % — ABNORMAL HIGH (ref 11.5–14.6)
WBC: 4.5 10*3/uL (ref 4.5–10.5)

## 2012-02-09 LAB — BASIC METABOLIC PANEL
BUN: 21 mg/dL (ref 6–23)
Calcium: 9.3 mg/dL (ref 8.4–10.5)
GFR: 69.5 mL/min (ref >60.00)
Glucose, Bld: 217 mg/dL — ABNORMAL HIGH (ref 70–99)
Potassium: 4.4 mEq/L (ref 3.5–5.1)
Sodium: 139 mEq/L (ref 135–145)

## 2012-02-09 LAB — URINALYSIS, ROUTINE W REFLEX MICROSCOPIC
Bilirubin Urine: NEGATIVE
Hgb urine dipstick: NEGATIVE
Total Protein, Urine: NEGATIVE
Urine Glucose: 250
Urobilinogen, UA: 0.2 (ref 0.0–1.0)

## 2012-02-09 LAB — LIPID PANEL
Cholesterol: 126 mg/dL (ref 0–200)
HDL: 49 mg/dL (ref >39.00)
LDL Cholesterol: 69 mg/dL (ref 0–99)
VLDL: 8.2 mg/dL (ref 0.0–40.0)

## 2012-02-09 NOTE — Telephone Encounter (Signed)
Request refill on metFORMIN (GLUCOPHAGE) 1000 MG tablet, simvastatin (ZOCOR) 40 MG tablet and    glimepiride (AMARYL) 4 MG tablet, pt does have appt on 3/28

## 2012-02-09 NOTE — Telephone Encounter (Signed)
Pt has refills on Metformin, Simvastatin, and Glimepiride at [facility]. Pt informed refills for medications are at pharmacy via VM and to callback office with any questions/concerns.

## 2012-02-17 ENCOUNTER — Encounter: Payer: Self-pay | Admitting: Endocrinology

## 2012-02-17 ENCOUNTER — Ambulatory Visit (INDEPENDENT_AMBULATORY_CARE_PROVIDER_SITE_OTHER): Payer: 59 | Admitting: Endocrinology

## 2012-02-17 VITALS — BP 122/70 | HR 82 | Temp 97.2°F | Wt 184.8 lb

## 2012-02-17 DIAGNOSIS — N058 Unspecified nephritic syndrome with other morphologic changes: Secondary | ICD-10-CM

## 2012-02-17 DIAGNOSIS — E1129 Type 2 diabetes mellitus with other diabetic kidney complication: Secondary | ICD-10-CM

## 2012-02-17 NOTE — Patient Instructions (Addendum)
Please start levemir 5 units each morning.  Here are a few sample pens. Call if your blood sugar stays over 200 in the afternoon, so we can increase it.   check your blood sugar 2 times a day.  vary the time of day when you check, between before the 3 meals, and at bedtime.  also check if you have symptoms of your blood sugar being too high or too low.  please keep a record of the readings and bring it to your next appointment here.  please call us sooner if your blood sugar goes below 70, or if it stays over 200. please consider these measures for your health:  minimize alcohol.  do not use tobacco products.  have a colonoscopy at least every 10 years from age 59.   keep firearms safely stored.  always use seat belts.  have working smoke alarms in your home.  see an eye doctor and dentist regularly.  never drive under the influence of alcohol or drugs (including prescription drugs).  those with fair skin should take precautions against the sun. It is ok to stay-off the losartan-hctz for now.

## 2012-02-17 NOTE — Progress Notes (Signed)
Subjective:    Patient ID: [name], male    DOB: November 11, 1946, 66 y.o.   MRN: [id number]  HPI here for regular wellness examination.  He's feeling pretty well in general, and says chronic med probs are stable, except as noted below Past Medical History  Diagnosis Date  . ALLERGIC RHINITIS 08/03/2007    Qualifier: Diagnosis of  By: Charlsie Quest RMA, Lucy    . BENIGN PROSTATIC HYPERTROPHY 08/03/2007    Qualifier: Diagnosis of  By: Charlsie Quest RMA, Lucy    . COLONIC POLYPS, HX OF 08/03/2007    Qualifier: Diagnosis of  By: Charlsie Quest RMA, Lucy   Qualifier: Diagnosis of  By: Russella Dar MD Marylu Lund   . 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 DIABETIC RETINOPATHY, BACKGROUND 09/29/2008    Qualifier: Diagnosis of  By: Everardo All MD, Cleophas Dunker DYSPHAGIA UNSPECIFIED 09/29/2008    Qualifier: Diagnosis of  By: Everardo All MD, Cleophas Dunker ESOPHAGEAL STRICTURE 12/28/2008    Qualifier: Diagnosis of  By: Russella Dar MD Bronson Curb T   . GERD 11/28/2008    Qualifier: Diagnosis of  By: Russella Dar MD Marylu Lund HYPERLIPIDEMIA 08/03/2007    Qualifier: Diagnosis of  By: [name] Apple, Lucy    . 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 URINARY CALCULUS 09/29/2008    Qualifier: Diagnosis of  By: Everardo All MD, Cleophas Dunker   . Unspecified essential hypertension 01/07/2008    Qualifier: Diagnosis of  By: Everardo All MD, Makinzey Banes A   . UNSPECIFIED ANEMIA 12/29/2007    Qualifier: Diagnosis of  By: Llana Aliment    . PAIN IN SOFT TISSUES OF LIMB 01/07/2008    Qualifier: Diagnosis of  By: Everardo All MD, Cleophas Dunker     Past Surgical History  Procedure Date  . Shoulder surgery     left  . Lithotripsy     History   Social History  . Marital Status: Married    Spouse Name: N/A    Number of Children: N/A  . 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 Smoking status: Former  Games developer  . Smokeless tobacco: Not on file  . Alcohol Use: Yes     occasional  . Drug Use: No  . Sexually Active: Not on file   Other Topics Concern  . Not on file   Social History Narrative   Daily Caffeine Use 1-2 daily    Current Outpatient Prescriptions on File Prior to Visit  Medication Sig Dispense Refill  . glimepiride (AMARYL) 4 MG tablet Take 1 tablet (4 mg total) by mouth 2 (two) times daily.  60 tablet  2  . metFORMIN (GLUCOPHAGE) 1000 MG tablet Take 1 tablet (1,000 mg total) by mouth 2 (two) times daily.  60 tablet  2  . pioglitazone (ACTOS) 45 MG tablet Take 1 tablet (45 mg total) by mouth daily.  30 tablet  2  . RABEprazole (ACIPHEX) 20 MG tablet Take 1 tablet (20 mg total) by mouth daily.  30 tablet  2  . simvastatin (ZOCOR) 40 MG tablet Take 1 tablet (40 mg total) by mouth at bedtime.  30 tablet  2    Allergies  Allergen Reactions  . Sulfamethoxazole W/Trimethoprim     REACTION:  Rash    Family History  Problem Relation Age of Onset  . Diabetes Mother   . Cancer Brother     Prostate Cancer  . Diabetes Brother   . Heart disease Brother   . Diabetes Brother     BP 122/70  Pulse 82  Temp(Src) 97.2 F (36.2 C) (Oral)  Wt 184 lb 12.8 oz (83.825 kg)  SpO2 98%     Review of Systems  Constitutional: Negative for fever and unexpected weight change.  HENT: Negative for hearing loss.   Eyes: Negative for visual disturbance.  Respiratory: Negative for shortness of breath.   Cardiovascular: Negative for chest pain.  Gastrointestinal: Negative for anal bleeding.  Genitourinary: Negative for hematuria and difficulty urinating.  Musculoskeletal: Negative for gait problem.  Skin: Negative for rash.  Neurological: Negative for syncope and numbness.  Hematological: Does not bruise/bleed easily.  Psychiatric/Behavioral: Negative for dysphoric mood.       Objective:   Physical Exam VS: see vs page GEN: no distress HEAD: head: no deformity eyes: no  periorbital swelling, no proptosis external nose and ears are normal mouth: no lesion seen NECK: supple, thyroid is not enlarged CHEST WALL: no deformity LUNGS: clear to auscultation BREASTS:  No gynecomastia CV: reg rate and rhythm, no murmur ABD: abdomen is soft, nontender.  no hepatosplenomegaly.  not distended.  no hernia GENITALIA:  Normal male.   RECTAL: normal external and internal exam.  heme neg. PROSTATE:  Normal size.  No nodule MUSCULOSKELETAL: muscle bulk and strength are grossly normal.  no obvious joint swelling.  gait is normal and steady EXTEMITIES: no deformity.  no ulcer on the feet.  feet are of normal color and temp.  no edema NEURO:  cn 2-12 grossly intact.   readily moves all 4's.  sensation is intact to touch on the feet SKIN:  Normal texture and temperature.  No rash or suspicious lesion is visible.   NODES:  None palpable at the neck PSYCH: alert, oriented x3.  Does not appear anxious nor depressed.      Assessment & Plan:  Wellness visit today, with problems stable, except as noted.    SEPARATE EVALUATION FOLLOWS--EACH PROBLEM HERE IS NEW, NOT RESPONDING TO TREATMENT, OR POSES SIGNIFICANT RISK TO THE PATIENT'S HEALTH: HISTORY OF THE PRESENT ILLNESS: Pt returns for f/u of type 2 DM (1985).  He seldom checks cbg's.  He says in the am, it is in the 100's.  PAST MEDICAL HISTORY reviewed and up to date today REVIEW OF SYSTEMS: PHYSICAL EXAMINATION: VITAL SIGNS:  See vs page GENERAL: no distress Pulses: dorsalis pedis intact bilat.  no carotid bruit LAB/XRAY RESULTS: Lab Results  Component Value Date   HGBA1C 9.0* 02/09/2012  IMPRESSION: HTN.  bp ok off hyzaar DM.  He needs insulin.  This a1c causes high risk to his health PLAN: See instruction page

## 2012-04-23 ENCOUNTER — Other Ambulatory Visit: Payer: Self-pay | Admitting: *Deleted

## 2012-04-23 MED ORDER — METFORMIN HCL 1000 MG PO TABS: 1000.0000 mg | ORAL_TABLET | Freq: Two times a day (BID) | ORAL | Status: DC

## 2012-04-23 MED ORDER — RABEPRAZOLE SODIUM 20 MG PO TBEC: 20.0000 mg | DELAYED_RELEASE_TABLET | Freq: Every day | ORAL | Status: DC

## 2012-04-23 MED ORDER — SIMVASTATIN 40 MG PO TABS: 40.0000 mg | ORAL_TABLET | Freq: Every day | ORAL | Status: DC

## 2012-04-23 MED ORDER — PIOGLITAZONE HCL 45 MG PO TABS: 45.0000 mg | ORAL_TABLET | Freq: Every day | ORAL | Status: DC

## 2012-04-23 MED ORDER — GLIMEPIRIDE 4 MG PO TABS: 4.0000 mg | ORAL_TABLET | Freq: Two times a day (BID) | ORAL | Status: DC

## 2012-04-23 NOTE — Telephone Encounter (Signed)
R'cd fax from [facility] for refill of Aciphex, Simvastatin, Metformin and Actos.

## 2012-05-07 ENCOUNTER — Ambulatory Visit (INDEPENDENT_AMBULATORY_CARE_PROVIDER_SITE_OTHER)
Admission: RE | Admit: 2012-05-07 | Discharge: 2012-05-07 | Disposition: A | Discharge status: Home / Self Care | Payer: 59 | Source: Ambulatory Visit | Attending: Endocrinology | Admitting: Endocrinology

## 2012-05-07 ENCOUNTER — Ambulatory Visit (INDEPENDENT_AMBULATORY_CARE_PROVIDER_SITE_OTHER): Payer: 59 | Admitting: Endocrinology

## 2012-05-07 ENCOUNTER — Encounter: Payer: Self-pay | Admitting: Endocrinology

## 2012-05-07 ENCOUNTER — Other Ambulatory Visit: Payer: Self-pay | Admitting: *Deleted

## 2012-05-07 ENCOUNTER — Other Ambulatory Visit (INDEPENDENT_AMBULATORY_CARE_PROVIDER_SITE_OTHER): Payer: 59

## 2012-05-07 VITALS — BP 132/78 | HR 74 | Temp 97.2°F | Ht 72.0 in | Wt 179.0 lb

## 2012-05-07 DIAGNOSIS — IMO0002 Reserved for concepts with insufficient information to code with codable children: Secondary | ICD-10-CM | POA: Insufficient documentation

## 2012-05-07 DIAGNOSIS — E1129 Type 2 diabetes mellitus with other diabetic kidney complication: Secondary | ICD-10-CM

## 2012-05-07 DIAGNOSIS — S9030XA Contusion of unspecified foot, initial encounter: Secondary | ICD-10-CM

## 2012-05-07 DIAGNOSIS — N058 Unspecified nephritic syndrome with other morphologic changes: Secondary | ICD-10-CM

## 2012-05-07 LAB — HEMOGLOBIN A1C: Hgb A1c MFr Bld: 8.7 % — ABNORMAL HIGH (ref 4.6–6.5)
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
## 2012-05-07 MED ORDER — GLUCOSE BLOOD VI STRP: 1.0000 | ORAL_STRIP | Freq: Two times a day (BID) | Status: DC

## 2012-05-07 MED ORDER — INSULIN DETEMIR 100 UNIT/ML ~~LOC~~ SOLN: 10.0000 [IU] | SUBCUTANEOUS | Status: DC

## 2012-05-07 NOTE — Patient Instructions (Addendum)
Here is a new meter.  i have sent a prescription to your pharmacy, for strips. Call if your blood sugar stays over 200 in the afternoon, so we can increase it.   check your blood sugar 2 times a day.  vary the time of day when you check, between before the 3 meals, and at bedtime.  also check if you have symptoms of your blood sugar being too high or too low.  please keep a record of the readings and bring it to your next appointment here.  please call us sooner if your blood sugar goes below 70, or if it stays over 200. blood tests and x-rays are being requested for you today.  You will receive a letter with results.  Please come back for a follow-up appointment in 3 months. You will get better much faster if you elevate your right foot above the rest of your body. I hope you feel better soon.  If you don't feel better by next week, please call back.  Please call sooner if you get worse.
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
## 2012-05-14 ENCOUNTER — Other Ambulatory Visit: Payer: Self-pay

## 2012-05-14 NOTE — Telephone Encounter (Signed)
A user error has taken place: encounter opened in error, closed for administrative reasons.

## 2012-05-17 ENCOUNTER — Other Ambulatory Visit: Payer: Self-pay | Admitting: *Deleted

## 2012-05-17 MED ORDER — INSULIN PEN NEEDLE 31G X 5 MM MISC: Status: DC

## 2012-05-17 NOTE — Telephone Encounter (Signed)
R'cd fax from [facility] for refill of Pen needles.

## 2012-12-28 ENCOUNTER — Other Ambulatory Visit: Payer: Self-pay | Admitting: *Deleted

## 2012-12-28 MED ORDER — INSULIN PEN NEEDLE 31G X 5 MM MISC: Status: DC

## 2013-04-05 ENCOUNTER — Other Ambulatory Visit: Payer: Self-pay | Admitting: *Deleted

## 2013-04-05 MED ORDER — METFORMIN HCL 1000 MG PO TABS: 1000.0000 mg | ORAL_TABLET | Freq: Two times a day (BID) | ORAL | Status: DC

## 2013-05-04 ENCOUNTER — Encounter: Payer: Self-pay | Admitting: Endocrinology

## 2013-05-04 ENCOUNTER — Ambulatory Visit (INDEPENDENT_AMBULATORY_CARE_PROVIDER_SITE_OTHER): Payer: Medicare Other | Admitting: Endocrinology

## 2013-05-04 VITALS — BP 126/80 | HR 80 | Ht 72.0 in | Wt 189.0 lb

## 2013-05-04 DIAGNOSIS — D649 Anemia, unspecified: Secondary | ICD-10-CM

## 2013-05-04 DIAGNOSIS — R809 Proteinuria, unspecified: Secondary | ICD-10-CM

## 2013-05-04 DIAGNOSIS — E1129 Type 2 diabetes mellitus with other diabetic kidney complication: Secondary | ICD-10-CM

## 2013-05-04 DIAGNOSIS — Z125 Encounter for screening for malignant neoplasm of prostate: Secondary | ICD-10-CM

## 2013-05-04 DIAGNOSIS — E7800 Pure hypercholesterolemia, unspecified: Secondary | ICD-10-CM

## 2013-05-04 DIAGNOSIS — Z79899 Other long term (current) drug therapy: Secondary | ICD-10-CM

## 2013-05-04 DIAGNOSIS — E785 Hyperlipidemia, unspecified: Secondary | ICD-10-CM

## 2013-05-04 LAB — HEPATIC FUNCTION PANEL
Albumin: 4.1 g/dL (ref 3.5–5.2)
Total Bilirubin: 0.8 mg/dL (ref 0.3–1.2)

## 2013-05-04 LAB — CBC WITH DIFFERENTIAL/PLATELET
Basophils Absolute: 0 10*3/uL (ref 0.0–0.1)
Basophils Relative: 0.7 % (ref 0.0–3.0)
HCT: 42 % (ref 39.0–52.0)
Hemoglobin: 14.2 g/dL (ref 13.0–17.0)
Lymphocytes Relative: 38.7 % (ref 12.0–46.0)
Lymphs Abs: 2.3 10*3/uL (ref 0.7–4.0)
MCHC: 33.8 g/dL (ref 30.0–36.0)
Monocytes Relative: 9.5 % (ref 3.0–12.0)
Neutro Abs: 2.9 10*3/uL (ref 1.4–7.7)
RBC: 4.52 Mil/uL (ref 4.22–5.81)
RDW: 14.4 % (ref 11.5–14.6)

## 2013-05-04 LAB — LIPID PANEL
LDL Cholesterol: 43 mg/dL (ref 0–99)
Total CHOL/HDL Ratio: 3
Triglycerides: 200 mg/dL — ABNORMAL HIGH (ref 0.0–149.0)

## 2013-05-04 LAB — MICROALBUMIN / CREATININE URINE RATIO
Creatinine,U: 160.4 mg/dL
Microalb Creat Ratio: 11.8 mg/g (ref 0.0–30.0)
Microalb, Ur: 18.9 mg/dL — ABNORMAL HIGH (ref 0.0–1.9)

## 2013-05-04 LAB — BASIC METABOLIC PANEL
CO2: 30 mEq/L (ref 19–32)
Calcium: 10.1 mg/dL (ref 8.4–10.5)
Creatinine, Ser: 1.2 mg/dL (ref 0.4–1.5)
GFR: 66.5 mL/min (ref >60.00)
Sodium: 138 mEq/L (ref 135–145)

## 2013-05-04 LAB — URINALYSIS, ROUTINE W REFLEX MICROSCOPIC
Leukocytes, UA: NEGATIVE
Nitrite: NEGATIVE
RBC / HPF: NONE SEEN (ref 0–?)
Specific Gravity, Urine: >=1.03 (ref 1.000–1.030)
Total Protein, Urine: 30
pH: 5.5 (ref 5.0–8.0)

## 2013-05-04 LAB — IBC PANEL: Saturation Ratios: 21.1 % (ref 20.0–50.0)

## 2013-05-04 LAB — PSA: PSA: 2.58 ng/mL (ref 0.10–4.00)

## 2013-05-04 LAB — TSH: TSH: 0.84 u[IU]/mL (ref 0.35–5.50)

## 2013-05-04 NOTE — Patient Instructions (Addendum)
check your blood sugar 2 times a day.  vary the time of day when you check, between before the 3 meals, and at bedtime.  also check if you have symptoms of your blood sugar being too high or too low.  please keep a record of the readings and bring it to your next appointment here.  please call us sooner if your blood sugar goes below 70, or if it stays over 200. blood tests are being requested for you today.  We'll contact you with results. Please come back soon for a "medicare wellness" appointment.  Please stop the actos, glimepiride, and metformin.  Please call if this causes your sugar to go up.  On this type of insulin schedule, you should eat meals on a regular schedule.  If a meal is missed or significantly delayed, your blood sugar could go low.

## 2013-05-04 NOTE — Progress Notes (Signed)
Subjective:    Patient ID: [name], male    DOB: 06/11/1946, 67 y.o.   MRN: [id number]  HPI Pt returns for f/u of type 2 DM (dx'ed 1985; he has mild if any neuropathy of the lower extremities; he has associated complications of nephropathy and retinopathy).  no cbg record, but states cbg's vary from 70-300, but most are in the 100's.  He says he frequently misses the insulin.  He says cbg is highest after a missed injection, and lowest in am.  He takes 3 orals in addition to the levemir.   Past Medical History  Diagnosis Date  . ALLERGIC RHINITIS 08/03/2007    Qualifier: Diagnosis of  By: Charlsie Quest RMA, Lucy    . BENIGN PROSTATIC HYPERTROPHY 08/03/2007    Qualifier: Diagnosis of  By: Charlsie Quest RMA, Lucy    . COLONIC POLYPS, HX OF 08/03/2007    Qualifier: Diagnosis of  By: Charlsie Quest RMA, Lucy   Qualifier: Diagnosis of  By: Russella Dar MD Marylu Lund   . 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 DIABETIC RETINOPATHY, BACKGROUND 09/29/2008    Qualifier: Diagnosis of  By: Everardo All MD, Cleophas Dunker DYSPHAGIA UNSPECIFIED 09/29/2008    Qualifier: Diagnosis of  By: Everardo All MD, Cleophas Dunker ESOPHAGEAL STRICTURE 12/28/2008    Qualifier: Diagnosis of  By: Russella Dar MD Bronson Curb T   . GERD 11/28/2008    Qualifier: Diagnosis of  By: Russella Dar MD Marylu Lund HYPERLIPIDEMIA 08/03/2007    Qualifier: Diagnosis of  By: [name] Apple, Lucy    . 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 URINARY CALCULUS 09/29/2008    Qualifier: Diagnosis of  By: Everardo All MD, Cleophas Dunker   . Unspecified essential hypertension 01/07/2008    Qualifier: Diagnosis of  By: Everardo All MD, Tae Robak A   . UNSPECIFIED ANEMIA 12/29/2007    Qualifier: Diagnosis of  By: Llana Aliment    . PAIN IN SOFT TISSUES OF LIMB 01/07/2008    Qualifier: Diagnosis of  By: Everardo All MD, Cleophas Dunker     Past Surgical History  Procedure Laterality Date  . Shoulder surgery       left  . Lithotripsy      History   Social History  . Marital Status: Married    Spouse Name: N/A    Number of Children: N/A  . 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 Smoking status: Former Games developer  . Smokeless tobacco: Not on file  . Alcohol Use: Yes     Comment: occasional  . Drug Use: No  . Sexually Active: Not on file   Other Topics Concern  . Not on file   Social History Narrative   Daily Caffeine Use 1-2 daily    Current Outpatient Prescriptions on File Prior to Visit  Medication Sig Dispense Refill  . aspirin 81 MG tablet Take 81 mg by mouth daily.      Marland Kitchen glucose blood (ONE TOUCH ULTRA TEST) test strip 1 each by Other route 2 (two) times daily. And lancets 2/day 250.43  100 each  12  . insulin detemir (LEVEMIR FLEXPEN) 100 UNIT/ML injection Inject 10 Units into the skin every morning.  15 mL  4  . Insulin  Pen Needle 31G X 5 MM MISC Use as directed 2 X daily. BD ULTRAPFINE MINI. PATIENT NEEDS TO SCHEDULE APPOINTMENT WITH DR. Everardo All FOR FURTHER REFILLS. LAST OV 05/07/12.  100 each  0  . NON FORMULARY Prostate Peantus  Once daily      . RABEprazole (ACIPHEX) 20 MG tablet Take 1 tablet (20 mg total) by mouth daily.  30 tablet  9  . simvastatin (ZOCOR) 40 MG tablet Take 1 tablet (40 mg total) by mouth at bedtime.  30 tablet  9   No current facility-administered medications on file prior to visit.    Allergies  Allergen Reactions  . Sulfamethoxazole W-Trimethoprim     REACTION: Rash   Family History  Problem Relation Age of Onset  . Diabetes Mother   . Cancer Brother     Prostate Cancer  . Diabetes Brother   . Heart disease Brother   . Diabetes Brother    BP 126/80  Pulse 80  Ht 6' (1.829 m)  Wt 189 lb (85.73 kg)  BMI 25.63 kg/m2  SpO2 98%  Review of Systems denies hypoglycemia and weight change    Objective:   Physical Exam VITAL SIGNS:  See vs page GENERAL: no distress      Assessment & Plan:  DM: This insulin regimen was chosen from multiple options, for its simplicity.  The benefits of glycemic control must be weighed against the risks of hypoglycemia.   Dyslipidemia, on rx. Microalbuminuria, due to DM

## 2013-06-13 ENCOUNTER — Other Ambulatory Visit: Payer: Self-pay | Admitting: *Deleted

## 2013-06-13 MED ORDER — INSULIN PEN NEEDLE 31G X 5 MM MISC: Status: DC

## 2013-06-13 MED ORDER — ONETOUCH ULTRASOFT LANCETS MISC: Status: DC

## 2013-06-13 MED ORDER — RABEPRAZOLE SODIUM 20 MG PO TBEC: 20.0000 mg | DELAYED_RELEASE_TABLET | Freq: Every day | ORAL | Status: DC

## 2013-06-13 MED ORDER — SIMVASTATIN 40 MG PO TABS: 40.0000 mg | ORAL_TABLET | Freq: Every day | ORAL | Status: DC

## 2013-06-13 NOTE — Telephone Encounter (Signed)
Rx request to pharmacy/[facility]  

## 2013-07-11 ENCOUNTER — Other Ambulatory Visit: Payer: Self-pay

## 2013-07-11 MED ORDER — INSULIN DETEMIR 100 UNIT/ML ~~LOC~~ SOLN: 10.0000 [IU] | SUBCUTANEOUS | Status: DC

## 2013-08-01 ENCOUNTER — Other Ambulatory Visit: Payer: Self-pay

## 2013-08-01 MED ORDER — GLUCOSE BLOOD VI STRP: 1.0000 | ORAL_STRIP | Freq: Two times a day (BID) | Status: DC

## 2013-08-08 ENCOUNTER — Ambulatory Visit (INDEPENDENT_AMBULATORY_CARE_PROVIDER_SITE_OTHER): Payer: Medicare Other | Admitting: Endocrinology

## 2013-08-08 ENCOUNTER — Encounter: Payer: Self-pay | Admitting: Endocrinology

## 2013-08-08 VITALS — BP 134/70 | HR 80 | Ht 72.0 in | Wt 192.0 lb

## 2013-08-08 DIAGNOSIS — Z86010 Personal history of colonic polyps: Secondary | ICD-10-CM

## 2013-08-08 DIAGNOSIS — E1129 Type 2 diabetes mellitus with other diabetic kidney complication: Secondary | ICD-10-CM

## 2013-08-08 MED ORDER — LACTULOSE 20 GM/30ML PO SOLN: 15.0000 mL | Freq: Two times a day (BID) | ORAL | Status: DC

## 2013-08-08 NOTE — Patient Instructions (Addendum)
check your blood sugar 2 times a day.  vary the time of day when you check, between before the 3 meals, and at bedtime.  also check if you have symptoms of your blood sugar being too high or too low.  please keep a record of the readings and bring it to your next appointment here.  please call us sooner if your blood sugar goes below 70, or if it stays over 200. blood tests are being requested for you today.  We'll contact you with results. Please come back soon for a "medicare wellness" appointment.  On this type of insulin schedule, you should eat meals on a regular schedule.  If a meal is missed or significantly delayed, your blood sugar could go low.  i have sent a prescription to your pharmacy, for a liquid medication to keep the bowels moving.  Refer for your colonoscopy.  you will receive a phone call, about a day and time for an appointment. Please increase the insulin to 75 units each morning.

## 2013-08-08 NOTE — Progress Notes (Signed)
Subjective:    Patient ID: [name], male    DOB: Jul 25, 1946, 67 y.o.   MRN: [id number]  HPI Pt returns for f/u of type 2 DM (dx'ed 1985; he has mild if any neuropathy of the lower extremities; he has associated complications of nephropathy and retinopathy).  no cbg record, but states cbg's are persistently in the 200's, despite increasing the levemir to 30 units bid.  It is in general higher as the day goes on. Past Medical History  Diagnosis Date  . ALLERGIC RHINITIS 08/03/2007    Qualifier: Diagnosis of  By: Charlsie Quest RMA, Lucy    . BENIGN PROSTATIC HYPERTROPHY 08/03/2007    Qualifier: Diagnosis of  By: Charlsie Quest RMA, Lucy    . COLONIC POLYPS, HX OF 08/03/2007    Qualifier: Diagnosis of  By: Charlsie Quest RMA, Lucy   Qualifier: Diagnosis of  By: Russella Dar MD Marylu Lund   . 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 DIABETIC RETINOPATHY, BACKGROUND 09/29/2008    Qualifier: Diagnosis of  By: Everardo All MD, Cleophas Dunker DYSPHAGIA UNSPECIFIED 09/29/2008    Qualifier: Diagnosis of  By: Everardo All MD, Cleophas Dunker ESOPHAGEAL STRICTURE 12/28/2008    Qualifier: Diagnosis of  By: Russella Dar MD Bronson Curb T   . GERD 11/28/2008    Qualifier: Diagnosis of  By: Russella Dar MD Marylu Lund HYPERLIPIDEMIA 08/03/2007    Qualifier: Diagnosis of  By: [name] Apple, Lucy    . 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 URINARY CALCULUS 09/29/2008    Qualifier: Diagnosis of  By: Everardo All MD, Cleophas Dunker   . Unspecified essential hypertension 01/07/2008    Qualifier: Diagnosis of  By: Everardo All MD, Anastasya Jewell A   . UNSPECIFIED ANEMIA 12/29/2007    Qualifier: Diagnosis of  By: Llana Aliment    . PAIN IN SOFT TISSUES OF LIMB 01/07/2008    Qualifier: Diagnosis of  By: Everardo All MD, Cleophas Dunker     Past Surgical History  Procedure Laterality Date  . Shoulder surgery      left  . Lithotripsy      History   Social History  . Marital  Status: Married    Spouse Name: N/A    Number of Children: N/A  . 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 Smoking status: Former Games developer  . Smokeless tobacco: Not on file  . Alcohol Use: Yes     Comment: occasional  . Drug Use: No  . Sexual Activity: Not on file   Other Topics Concern  . Not on file   Social History Narrative   Daily Caffeine Use 1-2 daily    Current Outpatient Prescriptions on File Prior to Visit  Medication Sig Dispense Refill  . aspirin 81 MG tablet Take 81 mg by mouth daily.      Marland Kitchen glucose blood (ONE TOUCH ULTRA TEST) test strip 1 each by Other route 2 (two) times daily. And lancets 2/day 250.43  100 each  12  . Insulin Pen Needle 31G X 5 MM MISC Use as directed 2 X daily. BD ULTRAPFINE MINI. Dx: 250.00  100 each  5  . Lancets (ONETOUCH ULTRASOFT) lancets Use as instructed 2x daily Dx: 250.00  100 each  5  .  NON FORMULARY Prostate Peantus  Once daily      . RABEprazole (ACIPHEX) 20 MG tablet Take 1 tablet (20 mg total) by mouth daily.  30 tablet  3  . simvastatin (ZOCOR) 40 MG tablet Take 1 tablet (40 mg total) by mouth at bedtime.  30 tablet  3   No current facility-administered medications on file prior to visit.    Allergies  Allergen Reactions  . Sulfamethoxazole W-Trimethoprim     REACTION: Rash    Family History  Problem Relation Age of Onset  . Diabetes Mother   . Cancer Brother     Prostate Cancer  . Diabetes Brother   . Heart disease Brother   . Diabetes Brother     BP 134/70  Pulse 80  Ht 6' (1.829 m)  Wt 192 lb (87.091 kg)  BMI 26.03 kg/m2  SpO2 97%  Review of Systems denies hypoglycemia and weight change.  He reports constipation.      Objective:   Physical Exam VITAL SIGNS:  See vs page GENERAL: no distress  Lab Results  Component Value Date   HGBA1C 12.3* 08/08/2013      Assessment & Plan:  DM: This insulin regimen was chosen from multiple options,  for its simplicity.  The benefits of glycemic control must be weighed against the risks of hypoglycemia.  He needs increased rx. Constipation, prob exac by hyperglycemia.

## 2013-08-19 ENCOUNTER — Encounter: Payer: Self-pay | Admitting: Gastroenterology

## 2013-09-07 ENCOUNTER — Ambulatory Visit (INDEPENDENT_AMBULATORY_CARE_PROVIDER_SITE_OTHER): Payer: Medicare Other | Admitting: Endocrinology

## 2013-09-07 ENCOUNTER — Encounter: Payer: Self-pay | Admitting: Endocrinology

## 2013-09-07 VITALS — BP 132/80 | HR 80 | Ht 72.0 in | Wt 192.0 lb

## 2013-09-07 DIAGNOSIS — Z0000 Encounter for general adult medical examination without abnormal findings: Secondary | ICD-10-CM

## 2013-09-07 DIAGNOSIS — H9110 Presbycusis, unspecified ear: Secondary | ICD-10-CM

## 2013-09-07 MED ORDER — RABEPRAZOLE SODIUM 20 MG PO TBEC: 20.0000 mg | DELAYED_RELEASE_TABLET | Freq: Every day | ORAL | Status: DC

## 2013-09-07 MED ORDER — INSULIN DETEMIR 100 UNIT/ML FLEXPEN: 90.0000 [IU] | PEN_INJECTOR | SUBCUTANEOUS | Status: DC

## 2013-09-07 NOTE — Patient Instructions (Addendum)
Please increase the insulin to 90 units each morning. check your blood sugar twice a day.  vary the time of day when you check, between before the 3 meals, and at bedtime.  also check if you have symptoms of your blood sugar being too high or too low.  please keep a record of the readings and bring it to your next appointment here.  You can write it on any piece of paper.  please call us sooner if your blood sugar goes below 70, or if you have a lot of readings over 200.  please consider these measures for your health:  minimize alcohol.  do not use tobacco products.  have a colonoscopy at least every 10 years from age 89.  keep firearms safely stored.  always use seat belts.  have working smoke alarms in your home.  see an eye doctor and dentist regularly.  never drive under the influence of alcohol or drugs (including prescription drugs).  those with fair skin should take precautions against the sun. Please let me know if you want to see a hearing specialist.  Please come back for a follow-up appointment in 1 month.

## 2013-09-07 NOTE — Progress Notes (Signed)
Subjective:    Patient ID: [name], male    DOB: 17-Nov-1946, 67 y.o.   MRN: [id number]  HPI Pt is here for regular wellness examination, and is feeling pretty well in general, and says chronic med probs are stable, except as noted below Past Medical History  Diagnosis Date  . ALLERGIC RHINITIS 08/03/2007    Qualifier: Diagnosis of  By: Charlsie Quest RMA, Lucy    . BENIGN PROSTATIC HYPERTROPHY 08/03/2007    Qualifier: Diagnosis of  By: Charlsie Quest RMA, Lucy    . COLONIC POLYPS, HX OF 08/03/2007    Qualifier: Diagnosis of  By: Charlsie Quest RMA, Lucy   Qualifier: Diagnosis of  By: Russella Dar MD Marylu Lund   . 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 DIABETIC RETINOPATHY, BACKGROUND 09/29/2008    Qualifier: Diagnosis of  By: Everardo All MD, Cleophas Dunker DYSPHAGIA UNSPECIFIED 09/29/2008    Qualifier: Diagnosis of  By: Everardo All MD, Cleophas Dunker ESOPHAGEAL STRICTURE 12/28/2008    Qualifier: Diagnosis of  By: Russella Dar MD Bronson Curb T   . GERD 11/28/2008    Qualifier: Diagnosis of  By: Russella Dar MD Marylu Lund HYPERLIPIDEMIA 08/03/2007    Qualifier: Diagnosis of  By: [name] Apple, Lucy    . 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 URINARY CALCULUS 09/29/2008    Qualifier: Diagnosis of  By: Everardo All MD, Cleophas Dunker   . Unspecified essential hypertension 01/07/2008    Qualifier: Diagnosis of  By: Everardo All MD, Izumi Mixon A   . UNSPECIFIED ANEMIA 12/29/2007    Qualifier: Diagnosis of  By: Llana Aliment    . PAIN IN SOFT TISSUES OF LIMB 01/07/2008    Qualifier: Diagnosis of  By: Everardo All MD, Cleophas Dunker     Past Surgical History  Procedure Laterality Date  . Shoulder surgery      left  . Lithotripsy      History   Social History  . Marital Status: Married    Spouse Name: N/A    Number of Children: N/A  . 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 Smoking  status: Former Games developer  . Smokeless tobacco: Not on file  . Alcohol Use: Yes     Comment: occasional  . Drug Use: No  . Sexual Activity: Not on file   Other Topics Concern  . Not on file   Social History Narrative   Daily Caffeine Use 1-2 daily    Current Outpatient Prescriptions on File Prior to Visit  Medication Sig Dispense Refill  . aspirin 81 MG tablet Take 81 mg by mouth daily.      Marland Kitchen glucose blood (ONE TOUCH ULTRA TEST) test strip 1 each by Other route 2 (two) times daily. And lancets 2/day 250.43  100 each  12  . Insulin Pen Needle 31G X 5 MM MISC Use as directed 2 X daily. BD ULTRAPFINE MINI. Dx: 250.00  100 each  5  . Lactulose 20 GM/30ML SOLN Take 15 mLs (10 g total) by mouth 2 (two) times daily.  960 mL  11  . Lancets (ONETOUCH ULTRASOFT) lancets Use as instructed 2x daily Dx: 250.00  100 each  5  . NON FORMULARY Prostate Peantus  Once daily      .  simvastatin (ZOCOR) 40 MG tablet Take 1 tablet (40 mg total) by mouth at bedtime.  30 tablet  3   No current facility-administered medications on file prior to visit.   Allergies  Allergen Reactions  . Sulfamethoxazole-Trimethoprim     REACTION: Rash   Family History  Problem Relation Age of Onset  . Diabetes Mother   . Cancer Brother     Prostate Cancer  . Diabetes Brother   . Heart disease Brother   . Diabetes Brother     BP 132/80  Pulse 80  Ht 6' (1.829 m)  Wt 192 lb (87.091 kg)  BMI 26.03 kg/m2  SpO2 97%  Review of Systems  Constitutional: Negative for fever and unexpected weight change.  HENT: Positive for congestion.   Eyes: Negative for visual disturbance.  Respiratory: Negative for shortness of breath.   Cardiovascular: Negative for chest pain.  Gastrointestinal: Negative for blood in stool.  Endocrine: Negative for cold intolerance.  Genitourinary: Negative for hematuria.  Musculoskeletal: Negative for back pain.  Skin: Negative for rash.  Allergic/Immunologic: Negative for environmental  allergies.  Neurological: Negative for syncope and numbness.  Psychiatric/Behavioral: Negative for dysphoric mood.       Objective:   Physical Exam VS: see vs page GEN: no distress HEAD: head: no deformity eyes: no periorbital swelling, no proptosis external nose and ears are normal mouth: no lesion seen NECK: supple, thyroid is not enlarged CHEST WALL: no deformity LUNGS: clear to auscultation BREASTS:  No gynecomastia. CV: reg rate and rhythm, no murmur ABD: abdomen is soft, nontender.  no hepatosplenomegaly.  not distended.  no hernia GENITALIA/RECTAL/PROSTATE:  He sees a urologist in [place]--dr wolf MUSCULOSKELETAL: muscle bulk and strength are grossly normal.  no obvious joint swelling.  gait is normal and steady PULSES: no carotid bruit.   NEURO:  cn 2-12 grossly intact.   readily moves all 4's.  SKIN:  Normal texture and temperature.  No rash or suspicious lesion is visible.   NODES:  None palpable at the neck.   PSYCH: alert, oriented x3.  Does not appear anxious nor depressed.     i reviewed electrocardiogram.      Assessment & Plan:  Wellness visit today, with problems stable, except as noted. we discussed code status.  pt requests full code, but would not want to be started or maintained on artificial life-support measures if there was not a reasonable chance of recovery.       SEPARATE EVALUATION FOLLOWS--EACH PROBLEM HERE IS NEW, NOT RESPONDING TO TREATMENT, OR POSES SIGNIFICANT RISK TO THE PATIENT'S HEALTH: HISTORY OF THE PRESENT ILLNESS: Pt returns for f/u of type 2 DM (dx'ed 1985; he has mild if any neuropathy of the lower extremities; he has associated complications of nephropathy and retinopathy).  no cbg record, but states cbg's are are approx 200.  There is no trend throughout the day.  He takes 70 units per day.   PAST MEDICAL HISTORY reviewed and up to date today REVIEW OF SYSTEMS: No change in chronic hearing loss.  He denies  hypoglycemia PHYSICAL EXAMINATION: VITAL SIGNS:  See vs page GENERAL: no distress Both eac's and tm's are normal LAB/XRAY RESULTS: Lab Results  Component Value Date   HGBA1C 12.3* 08/08/2013  IMPRESSION: DM: he needs increased rx Hearing loss, new PLAN: See instruction page

## 2013-09-08 DIAGNOSIS — Z0000 Encounter for general adult medical examination without abnormal findings: Secondary | ICD-10-CM | POA: Insufficient documentation

## 2013-09-15 ENCOUNTER — Telehealth: Payer: Self-pay | Admitting: Endocrinology

## 2013-09-15 MED ORDER — ESOMEPRAZOLE MAGNESIUM 40 MG PO CPDR: 40.0000 mg | DELAYED_RELEASE_CAPSULE | Freq: Every day | ORAL | Status: DC

## 2013-09-15 NOTE — Telephone Encounter (Signed)
please call patient: Per insurance, i changed aciphex to nexium. Please call if this does not work

## 2013-09-15 NOTE — Telephone Encounter (Signed)
Left message on voicemail.

## 2013-09-21 ENCOUNTER — Encounter: Payer: Self-pay | Admitting: Gastroenterology

## 2013-09-21 ENCOUNTER — Ambulatory Visit (INDEPENDENT_AMBULATORY_CARE_PROVIDER_SITE_OTHER): Payer: Medicare Other | Admitting: Gastroenterology

## 2013-09-21 VITALS — BP 140/68 | HR 80 | Ht 71.0 in | Wt 196.5 lb

## 2013-09-21 DIAGNOSIS — Z86010 Personal history of colonic polyps: Secondary | ICD-10-CM

## 2013-09-21 DIAGNOSIS — K921 Melena: Secondary | ICD-10-CM

## 2013-09-21 DIAGNOSIS — K5900 Constipation, unspecified: Secondary | ICD-10-CM

## 2013-09-21 DIAGNOSIS — R198 Other specified symptoms and signs involving the digestive system and abdomen: Secondary | ICD-10-CM

## 2013-09-21 MED ORDER — PEG-KCL-NACL-NASULF-NA ASC-C 100 G PO SOLR: 1.0000 | Freq: Once | ORAL | Status: DC

## 2013-09-21 NOTE — Patient Instructions (Signed)
You have been scheduled for a colonoscopy with propofol. Please follow written instructions given to you at your visit today.  Please pick up your prep kit at the pharmacy within the next 1-3 days. If you use inhalers (even only as needed), please bring them with you on the day of your procedure.  Stop taking your Lactulose and start Miralax mixing 17 grams in 8 oz of water twice daily to titrate on depending on bowel movements.   Thank you for choosing me and [facility].  Venita Lick. Pleas Koch., MD., Clementeen Graham

## 2013-09-21 NOTE — Progress Notes (Signed)
    History of Present Illness: This is a 67 year old male who relates the onset of constipation approximately 2 months ago. He noted small amounts of bright red blood per rectum with hard stools. Started on lactulose by Dr. Everardo All recently and his constipation has improved however since starting lactulose he has noted intestinal gas. His rectal bleeding has resolved. He underwent colonoscopy in August 2002 showing a small adenomatous colon polyp and internal hemorrhoids. He did not return for recommended followup in 2007. Denies weight loss, abdominal pain, diarrhea, change in stool caliber, melena, nausea, vomiting, dysphagia, reflux symptoms, chest pain.   Review of Systems: Pertinent positive and negative review of systems were noted in the above HPI section. All other review of systems were otherwise negative.  Current Medications, Allergies, Past Medical History, Past Surgical History, Family History and Social History were reviewed in [employer] record.  Physical Exam: General: Well developed , well nourished, no acute distress Head: Normocephalic and atraumatic Eyes:  sclerae anicteric, EOMI Ears: Normal auditory acuity Mouth: No deformity or lesions Neck: Supple, no masses or thyromegaly Lungs: Clear throughout to auscultation Heart: Regular rate and rhythm; no murmurs, rubs or bruits Abdomen: Soft, non tender and non distended. No masses, hepatosplenomegaly or hernias noted. Normal Bowel sounds Rectal: DRE by his urologist 2 weeks ago showed hemorrhoids, deferred to colonoscopy Musculoskeletal: Symmetrical with no gross deformities  Skin: No lesions on visible extremities Pulses:  Normal pulses noted Extremities: No clubbing, cyanosis, edema or deformities noted Neurological: Alert oriented x 4, grossly nonfocal Cervical Nodes:  No significant cervical adenopathy Inguinal Nodes: No significant inguinal adenopathy Psychological:  Alert and cooperative.  Normal mood and affect  Assessment and Recommendations:  1. Hematochezia, constipation, change in bowel habits, personal history of adenomatous colon polyps. Change to MiraLax twice daily may titrate dose upward down for adequate bowel movements. Rule out colorectal neoplasms hemorrhoids and other disorders. Schedule colonoscopy. The risks, benefits, and alternatives to colonoscopy with possible biopsy, possible destruction of internal hemorrhoids and possible polypectomy were discussed with the patient and they consent to proceed.

## 2013-09-26 ENCOUNTER — Telehealth: Payer: Self-pay | Admitting: *Deleted

## 2013-09-26 NOTE — Telephone Encounter (Signed)
[facility] Triage Call Report Triage Record Num: [id number] Operator: Cornell Barman Patient Name: [name] Call Date & Time: 09/22/2013 5:22:51PM Patient Phone: [phone] PCP: Romero Belling Patient Gender: Male PCP Fax : [phone] Patient DOB: 1946/01/09 Practice Name: [facility] Reason for Call: Caller: Judeth Cornfield / Pharmacist at [facility] ; PCP: Romero Belling (Adults only); CB#: [phone]; Call regarding prescription for Levemir . Pharmacist states that they no longer make Levemir Flex pen and patient needs new script that states "Levemire Flex touch". Reviewed Epic .It is noted that Dr. Everardo All sent a prescripton to [facility] on 09/07/13 for -Insulin Detemir (LEVEMIR FLEXTOUCH) 100 UNIT/ML SOPN 10 pen 11 09/07/2013 .Sig - Route: Inject 90 Units into the skin every morning. And pen needles 1/day - Subcutaneous . E-Prescribing Status: Receipt confirmed by pharmacy (09/07/2013 8:07 AM EDT) . Reviewed order with pharmacy. She is going to contact [facility] to see what happened with the prescription . She will call back for information if needed. Epic reviewed for medications, allergy and history. Protocol(s) Used: Office Note Recommended Outcome per Protocol: Information Noted and Sent to Office Reason for Outcome: Caller information to office Care Advice: ~

## 2013-09-26 NOTE — Telephone Encounter (Signed)
It is ok to change to "flextouch."

## 2013-09-27 ENCOUNTER — Encounter: Payer: Self-pay | Admitting: Gastroenterology

## 2013-11-02 ENCOUNTER — Ambulatory Visit (AMBULATORY_SURGERY_CENTER): Payer: Medicare Other | Admitting: Gastroenterology

## 2013-11-02 ENCOUNTER — Encounter: Payer: Medicare Other | Admitting: Gastroenterology

## 2013-11-02 ENCOUNTER — Encounter: Payer: Self-pay | Admitting: Gastroenterology

## 2013-11-02 VITALS — BP 130/74 | HR 68 | Temp 97.2°F | Resp 13 | Ht 71.0 in | Wt 196.0 lb

## 2013-11-02 DIAGNOSIS — Z860100 Personal history of colon polyps, unspecified: Secondary | ICD-10-CM

## 2013-11-02 DIAGNOSIS — K921 Melena: Secondary | ICD-10-CM

## 2013-11-02 MED ORDER — SODIUM CHLORIDE 0.9 % IV SOLN: 500.0000 mL | INTRAVENOUS | Status: DC

## 2013-11-02 NOTE — Op Note (Signed)
[facility] [address] [place], [address]   COLONOSCOPY PROCEDURE REPORT  PATIENT: [name], [name]  MR#: [id number] BIRTHDATE: May 12, 1946 , 67  yrs. old GENDER: Male ENDOSCOPIST: Meryl Dare, MD, [facility] PROCEDURE DATE:  11/02/2013 PROCEDURE:   Colonoscopy, diagnostic First Screening Colonoscopy - Avg.  risk and is 50 yrs.  old or older - No.  Prior Negative Screening - Now for repeat screening. N/A  History of Adenoma - Now for follow-up colonoscopy & has been > or = to 3 yrs.  Yes hx of adenoma.  Has been 3 or more years since last colonoscopy.  Polyps Removed Today? No.  Recommend repeat exam, <10 yrs? Yes.  High risk (family or personal hx). ASA CLASS:   Class II INDICATIONS:Patient's personal history of adenomatous colon polyps and hematochezia. MEDICATIONS: MAC sedation, administered by CRNA and propofol (Diprivan) 300mg  IV DESCRIPTION OF PROCEDURE:   After the risks benefits and alternatives of the procedure were thoroughly explained, informed consent was obtained.  A digital rectal exam revealed no abnormalities of the rectum.   The LB [id number]  endoscope was introduced through the anus and advanced to the cecum, which was identified by both the appendix and ileocecal valve. No adverse events experienced.   The quality of the prep was good, using MoviPrep  The instrument was then slowly withdrawn as the colon was fully examined.  COLON FINDINGS: A normal appearing cecum, ileocecal valve, and appendiceal orifice were identified.  The ascending, hepatic flexure, transverse, splenic flexure, descending, sigmoid colon and rectum appeared unremarkable.  No polyps or cancers were seen. Retroflexed views revealed small internal hemorrhoids. The time to cecum=3 minutes 28 seconds.  Withdrawal time=10 minutes 05 seconds. The scope was withdrawn and the procedure completed.  COMPLICATIONS: There were no complications.  ENDOSCOPIC IMPRESSION: 1.    Normal colon 2.  Small internal hemorrhoids  RECOMMENDATIONS: 1.  Repeat Colonoscopy in 5 years.  eSigned:  Meryl Dare, MD, [facility] 11/02/2013 2:50 PM

## 2013-11-02 NOTE — Progress Notes (Signed)
Patient did not experience any of the following events: a burn prior to discharge; a fall within the facility; wrong site/side/patient/procedure/implant event; or a hospital transfer or hospital admission upon discharge from the facility. (G8907) Patient did not have preoperative order for IV antibiotic SSI prophylaxis. (G8918)  

## 2013-11-02 NOTE — Patient Instructions (Signed)
Discharge instructions given with verbal understanding. Handouts on hemorrhoids and a high fiber diet. Resume previous medications. YOU HAD AN ENDOSCOPIC PROCEDURE TODAY AT THE [facility]: Refer to the procedure report that was given to you for any specific questions about what was found during the examination.  If the procedure report does not answer your questions, please call your gastroenterologist to clarify.  If you requested that your care partner not be given the details of your procedure findings, then the procedure report has been included in a sealed envelope for you to review at your convenience later.  YOU SHOULD EXPECT: Some feelings of bloating in the abdomen. Passage of more gas than usual.  Walking can help get rid of the air that was put into your GI tract during the procedure and reduce the bloating. If you had a lower endoscopy (such as a colonoscopy or flexible sigmoidoscopy) you may notice spotting of blood in your stool or on the toilet paper. If you underwent a bowel prep for your procedure, then you may not have a normal bowel movement for a few days.  DIET: Your first meal following the procedure should be a light meal and then it is ok to progress to your normal diet.  A half-sandwich or bowl of soup is an example of a good first meal.  Heavy or fried foods are harder to digest and may make you feel nauseous or bloated.  Likewise meals heavy in dairy and vegetables can cause extra gas to form and this can also increase the bloating.  Drink plenty of fluids but you should avoid alcoholic beverages for 24 hours.  ACTIVITY: Your care partner should take you home directly after the procedure.  You should plan to take it easy, moving slowly for the rest of the day.  You can resume normal activity the day after the procedure however you should NOT DRIVE or use heavy machinery for 24 hours (because of the sedation medicines used during the test).    SYMPTOMS TO REPORT  IMMEDIATELY: A gastroenterologist can be reached at any hour.  During normal business hours, 8:30 AM to 5:00 PM Monday through Friday, call [phone].  After hours and on weekends, please call the GI answering service at [phone] who will take a message and have the physician on call contact you.   Following lower endoscopy (colonoscopy or flexible sigmoidoscopy):  Excessive amounts of blood in the stool  Significant tenderness or worsening of abdominal pains  Swelling of the abdomen that is new, acute  Fever of 100F or higher FOLLOW UP: If any biopsies were taken you will be contacted by phone or by letter within the next 1-3 weeks.  Call your gastroenterologist if you have not heard about the biopsies in 3 weeks.  Our staff will call the home number listed on your records the next business day following your procedure to check on you and address any questions or concerns that you may have at that time regarding the information given to you following your procedure. This is a courtesy call and so if there is no answer at the home number and we have not heard from you through the emergency physician on call, we will assume that you have returned to your regular daily activities without incident.  SIGNATURES/CONFIDENTIALITY: You and/or your care partner have signed paperwork which will be entered into your electronic medical record.  These signatures attest to the fact that that the information above on your After   Visit Summary has been reviewed and is understood.  Full responsibility of the confidentiality of this discharge information lies with you and/or your care-partner. 

## 2013-11-02 NOTE — Progress Notes (Signed)
Lidocaine-40mg IV prior to Propofol InductionPropofol given over incremental dosages 

## 2013-11-03 ENCOUNTER — Telehealth: Payer: Self-pay | Admitting: *Deleted

## 2013-11-03 NOTE — Telephone Encounter (Signed)
LM on VM to return call if questions problems or concerns. ewm

## 2013-12-05 ENCOUNTER — Ambulatory Visit: Payer: Self-pay | Admitting: Family Medicine

## 2013-12-05 IMAGING — CR DG CHEST 2V
1 series · 2 of 2 positions shown · non-contrast
Comparison: None.

CLINICAL DATA: History of asthma, now with 1 week history of
productive cough.

EXAM:
CHEST  2 VIEW

[Series 1: pa · 0.17mm/px · 2 of 2 slices shown]
[im 1/2]
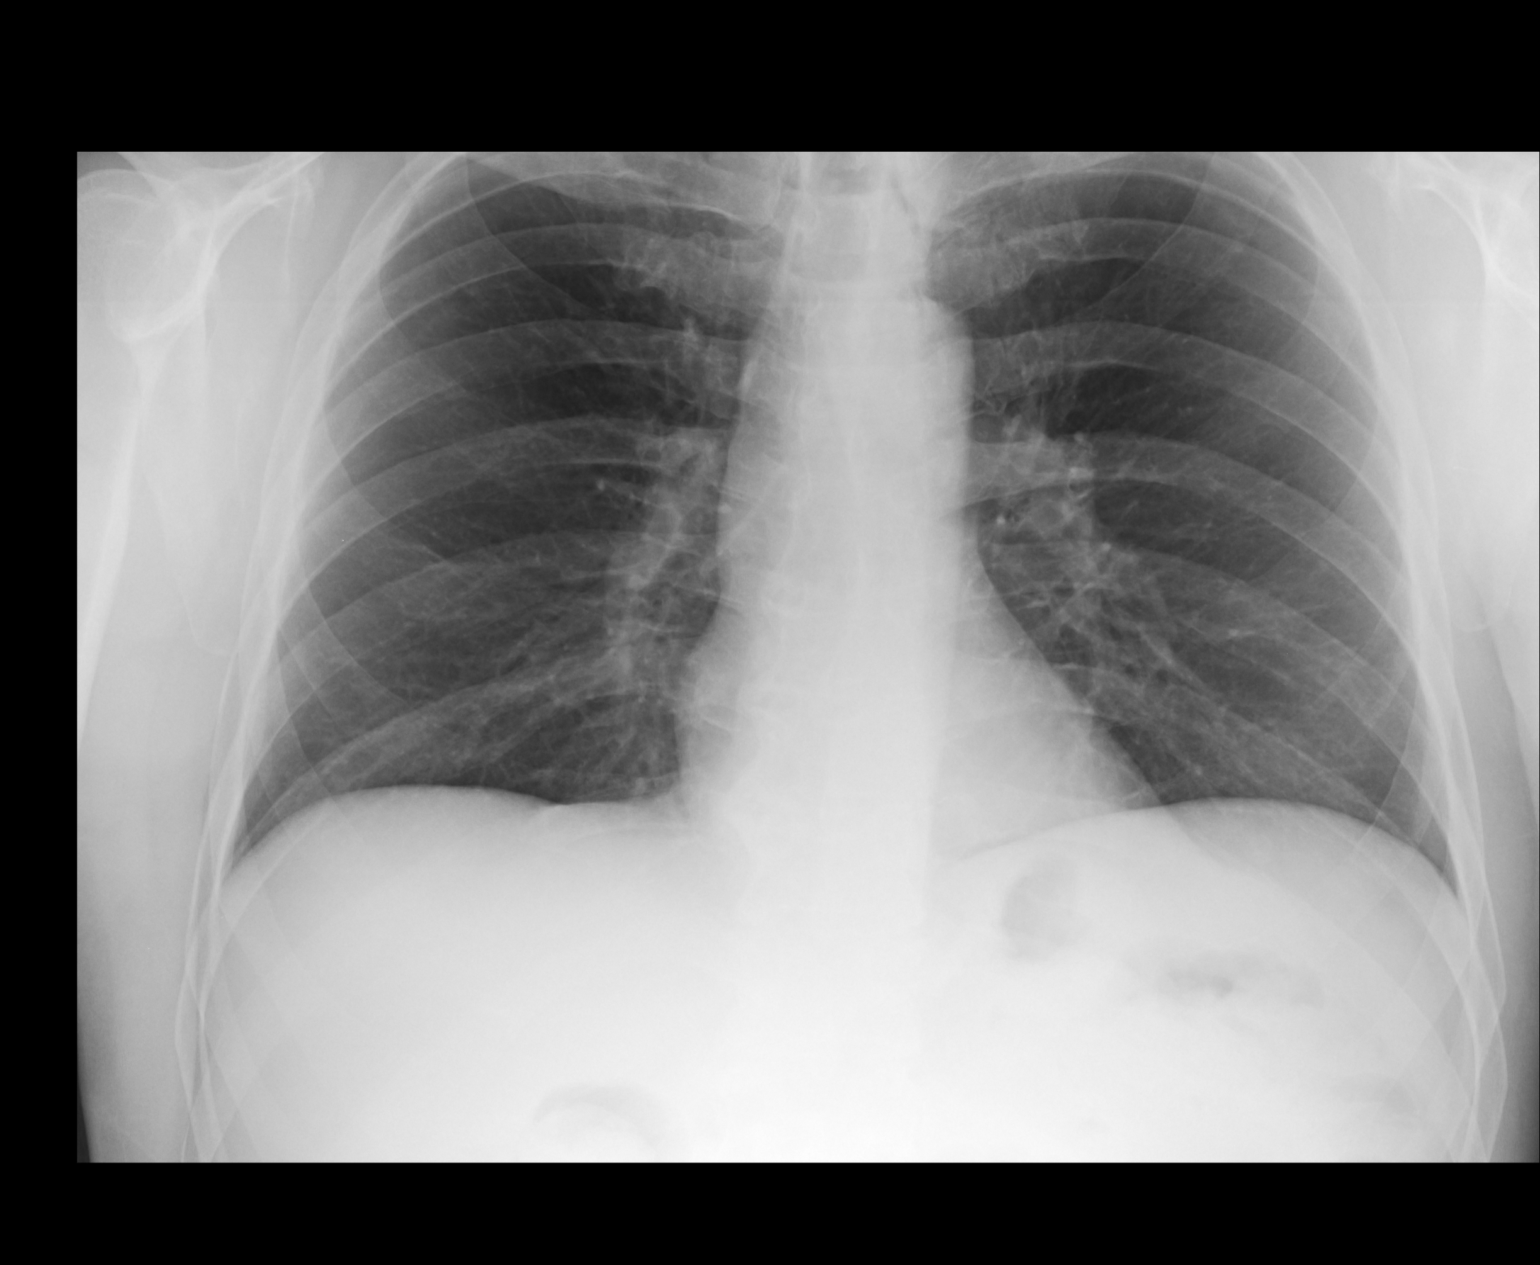
[im 2/2]
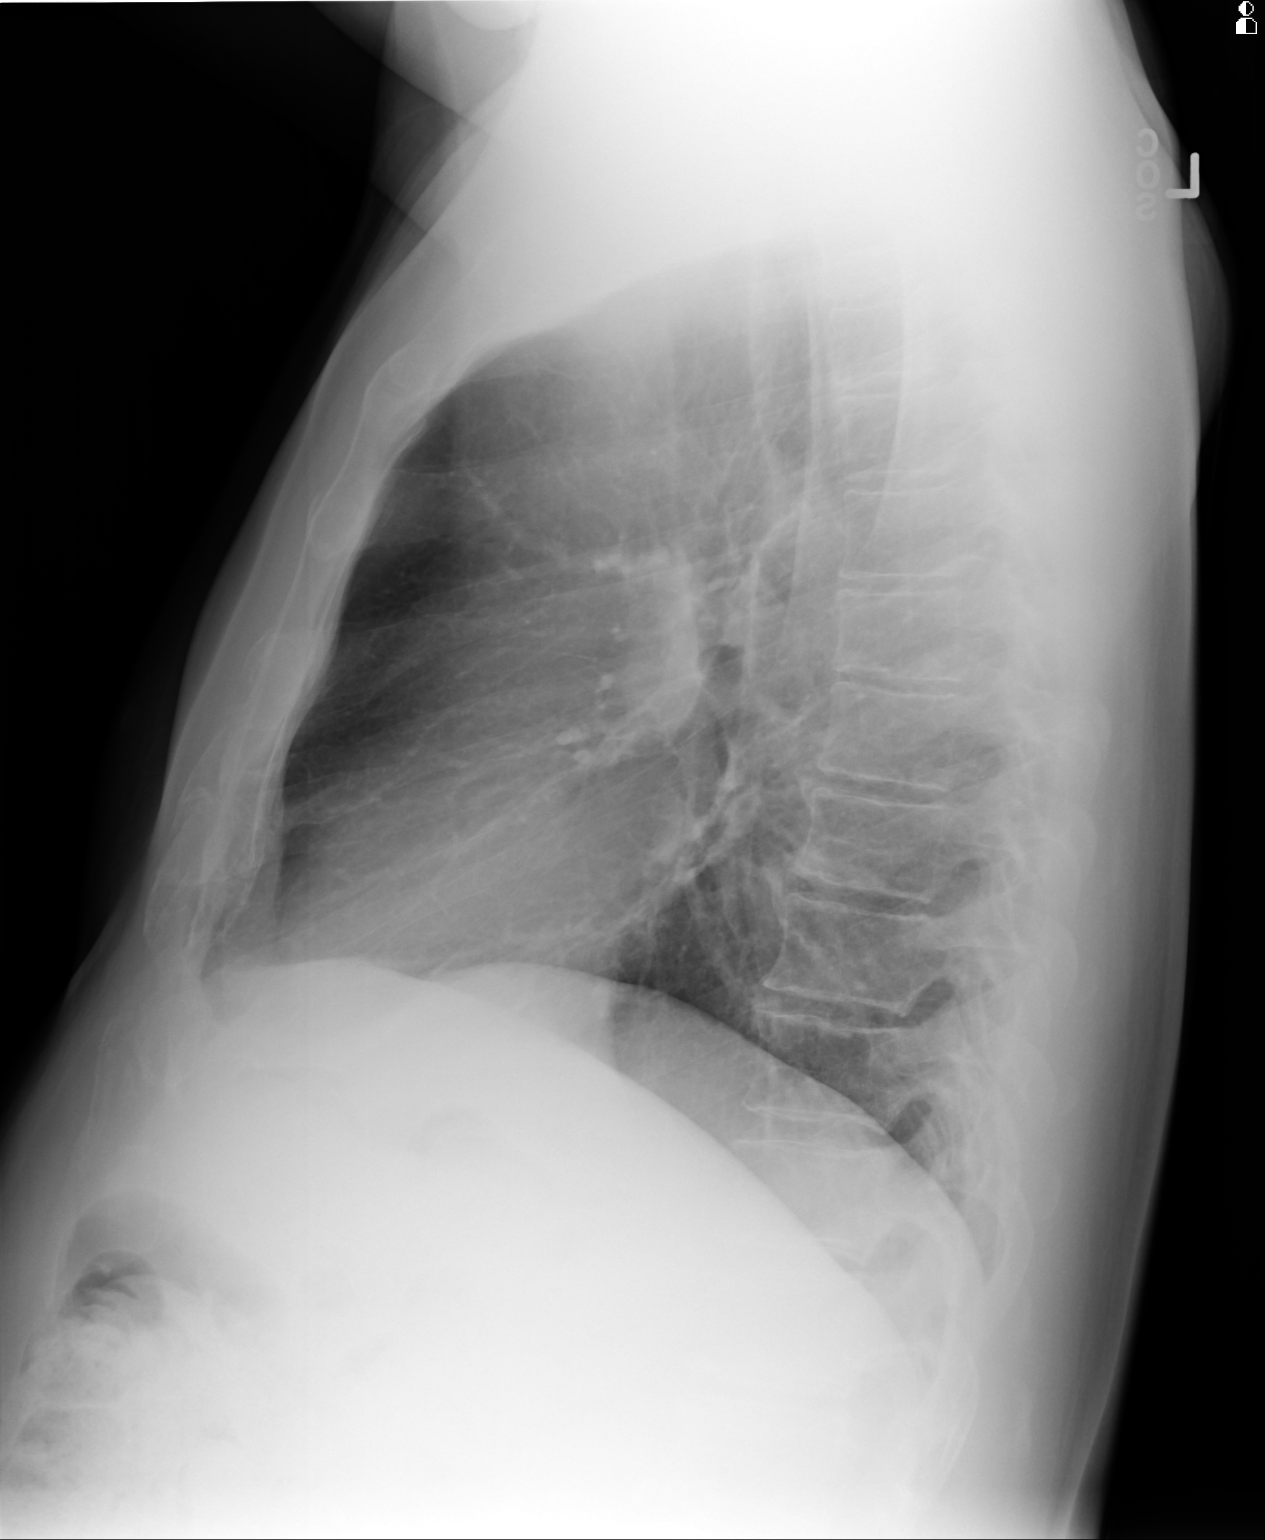

[2 of 2 positions shown; findings below may reference images not displayed]

FINDINGS: The lungs are adequately inflated and clear. The cardiac silhouette
is normal in size. The mediastinum is normal in width. There is no
pleural effusion or pneumothorax. The observed portions of the bony
thorax appear normal.
IMPRESSION: There is no evidence of active cardiopulmonary disease.

## 2014-02-15 ENCOUNTER — Ambulatory Visit (INDEPENDENT_AMBULATORY_CARE_PROVIDER_SITE_OTHER): Payer: Medicare Other | Admitting: Endocrinology

## 2014-02-15 ENCOUNTER — Encounter: Payer: Self-pay | Admitting: Endocrinology

## 2014-02-15 ENCOUNTER — Ambulatory Visit
Admission: RE | Admit: 2014-02-15 | Discharge: 2014-02-15 | Disposition: A | Discharge status: Home / Self Care | Payer: Medicare Other | Source: Ambulatory Visit | Attending: Endocrinology | Admitting: Endocrinology

## 2014-02-15 VITALS — BP 140/92 | HR 92 | Temp 98.2°F | Ht 71.0 in | Wt 204.0 lb

## 2014-02-15 DIAGNOSIS — R059 Cough, unspecified: Secondary | ICD-10-CM

## 2014-02-15 DIAGNOSIS — R05 Cough: Secondary | ICD-10-CM

## 2014-02-15 DIAGNOSIS — E1129 Type 2 diabetes mellitus with other diabetic kidney complication: Secondary | ICD-10-CM

## 2014-02-15 LAB — HEMOGLOBIN A1C: HEMOGLOBIN A1C: 11.4 % — AB (ref 4.6–6.5)

## 2014-02-15 IMAGING — CR DG CHEST 2V
2 series · 2 of 2 positions shown · non-contrast
Comparison: [DATE]

CLINICAL DATA: Cough, dyspnea

EXAM:
CHEST  2 VIEW

[w chest pa]
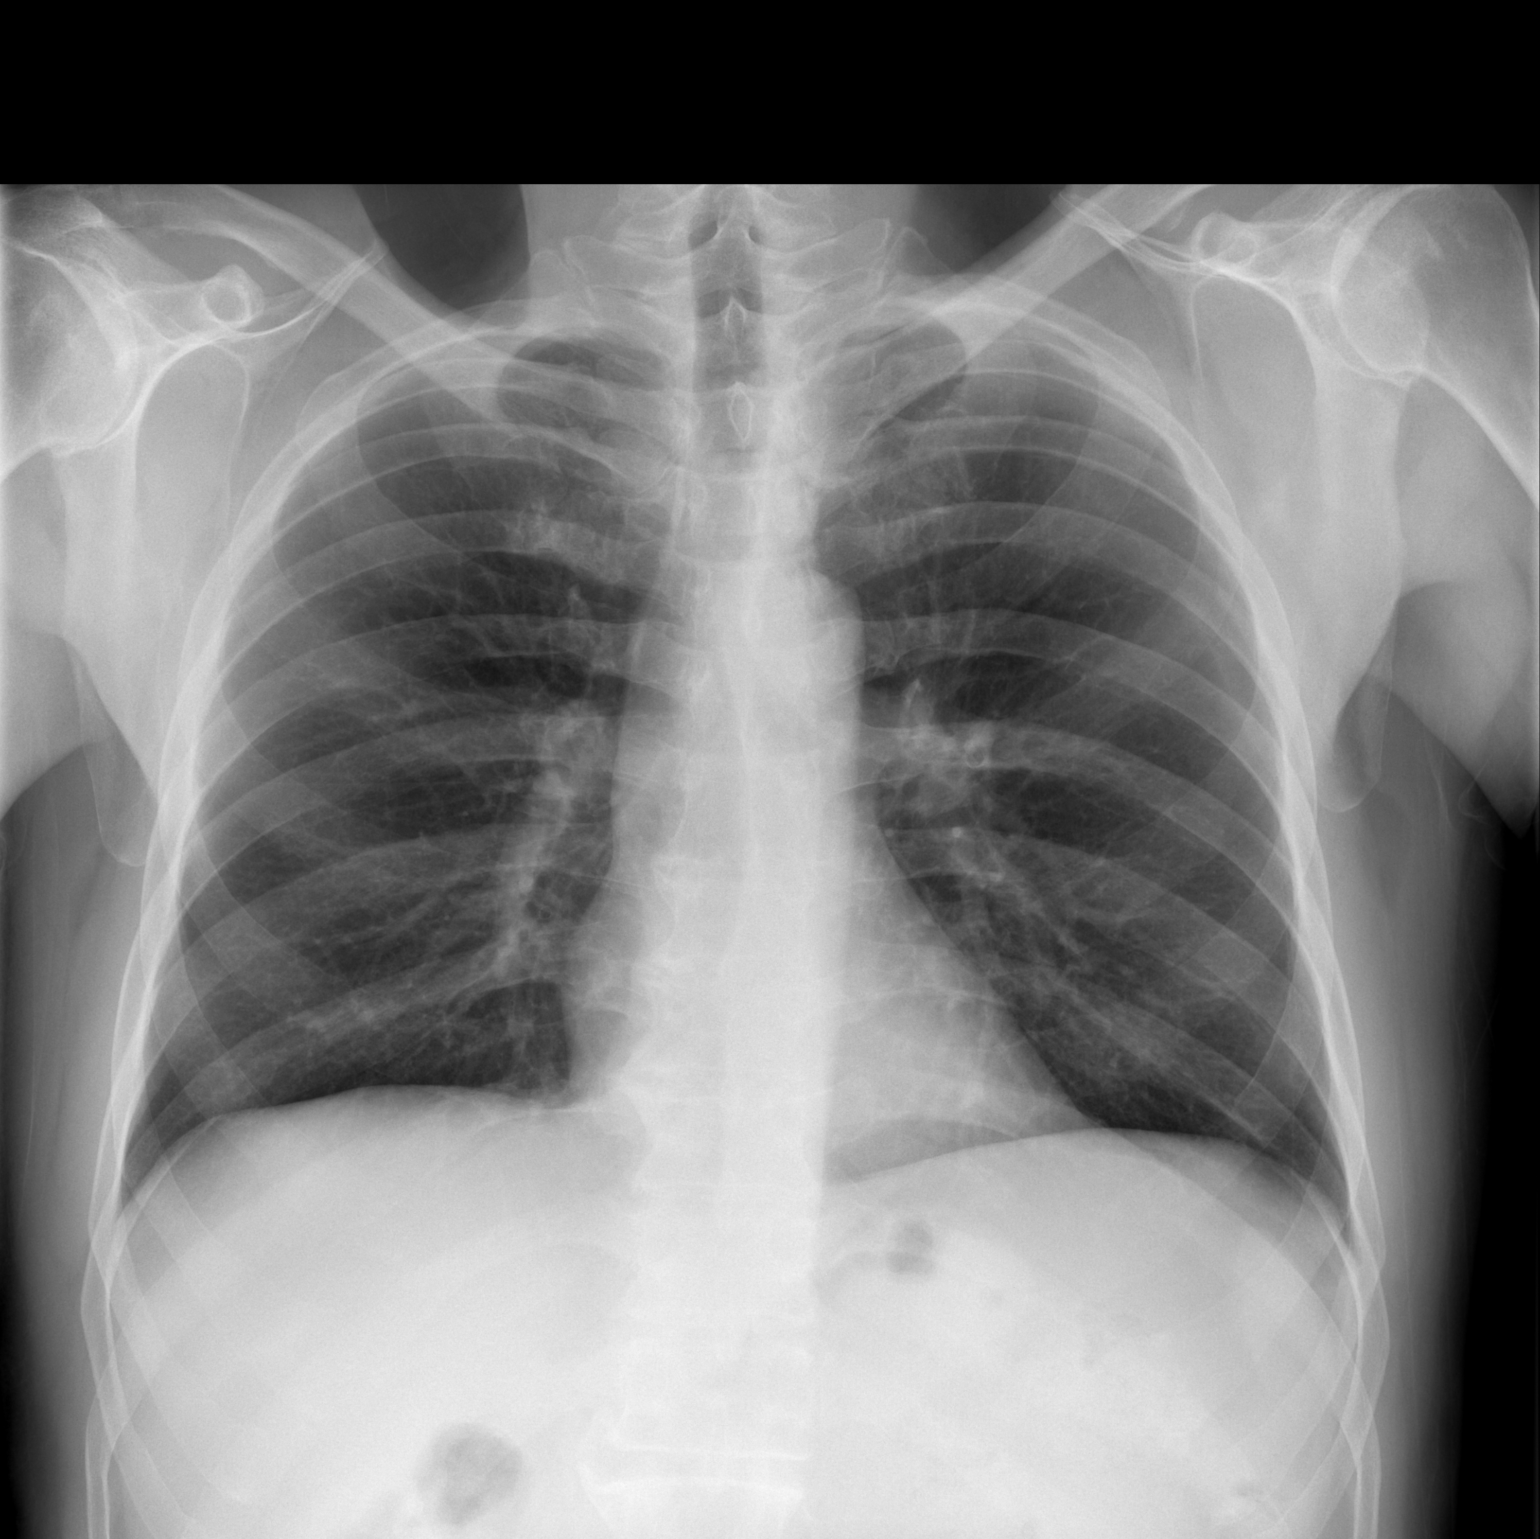

[w chest lat]
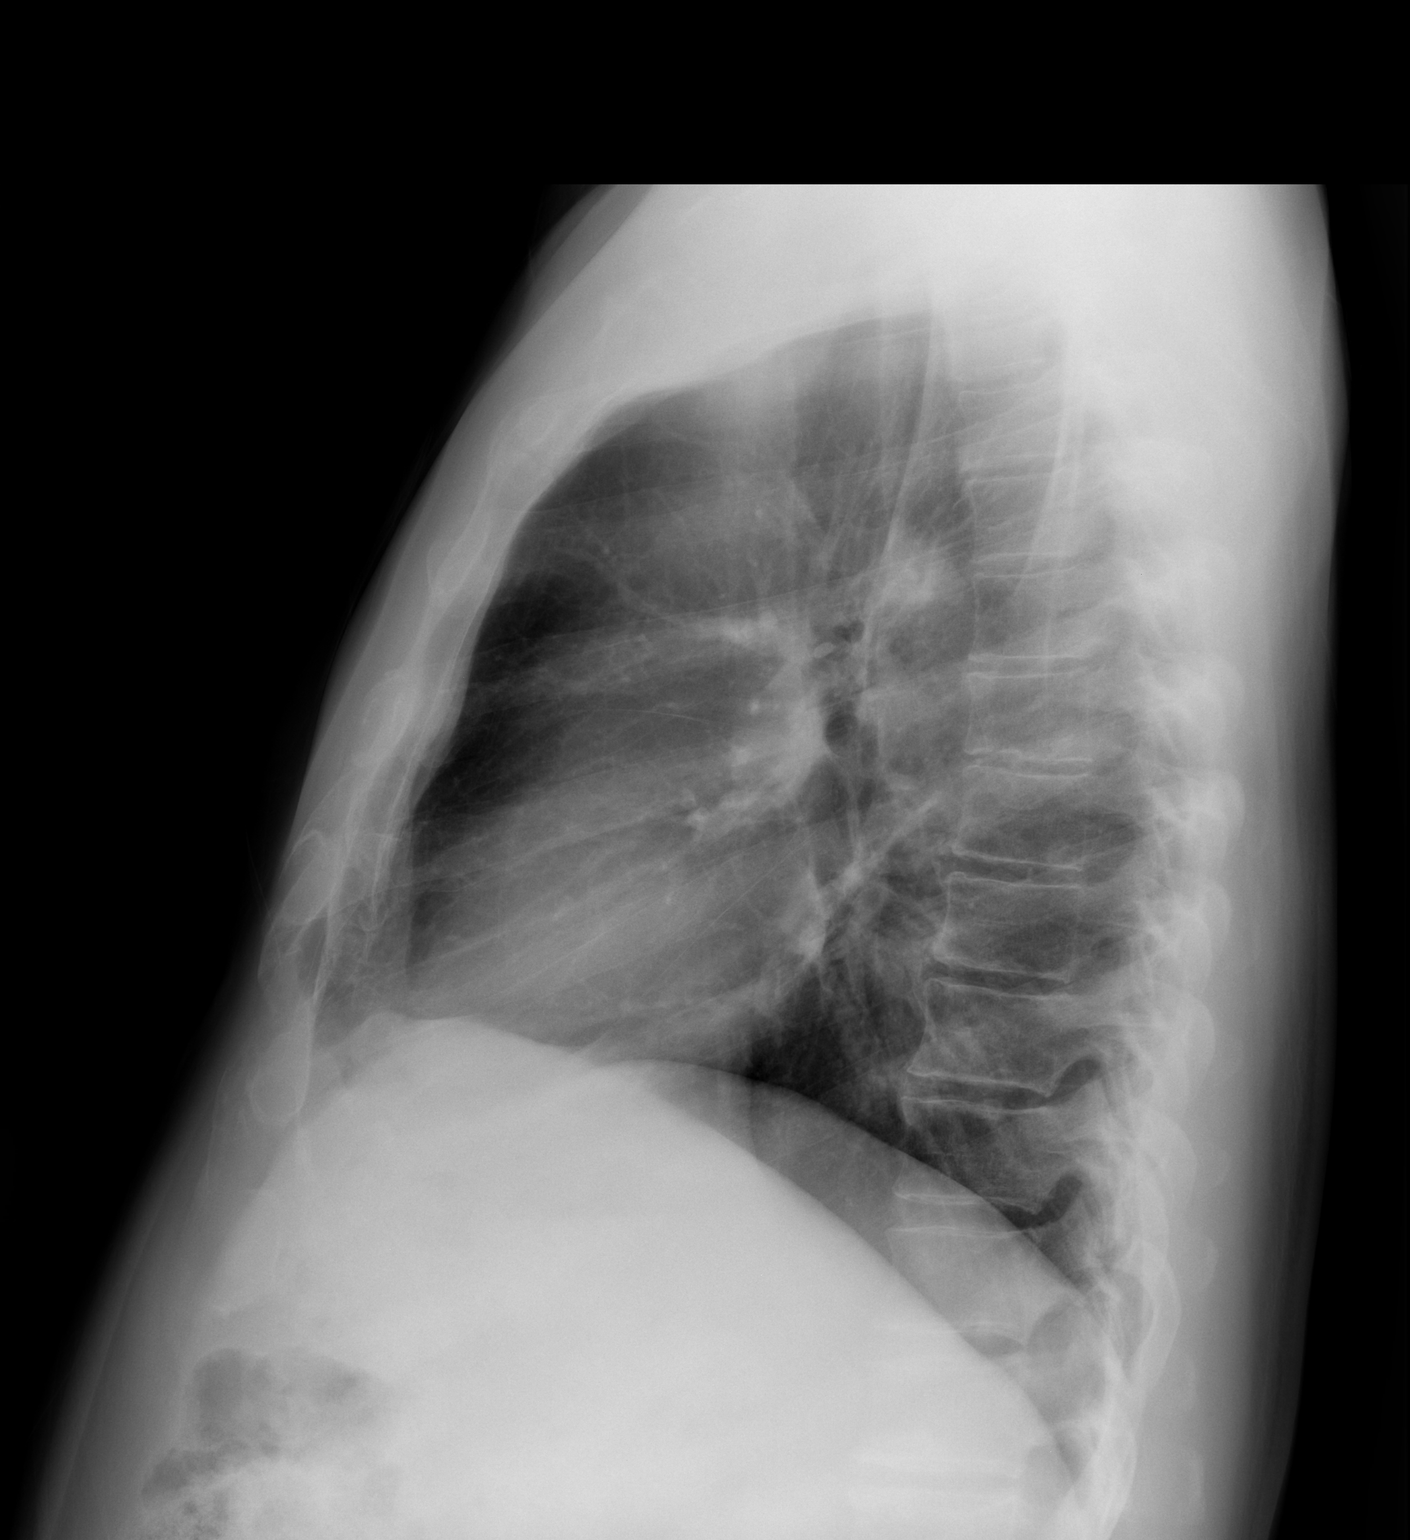

[2 of 2 positions shown; findings below may reference images not displayed]

FINDINGS: Cardiomediastinal silhouette is stable. No acute infiltrate or
pleural effusion. No pulmonary edema. Mild hyperinflation. Bony
thorax is unremarkable.
IMPRESSION: No active cardiopulmonary disease.  Mild hyperinflation.

## 2014-02-15 MED ORDER — CEFUROXIME AXETIL 250 MG PO TABS: 250.0000 mg | ORAL_TABLET | Freq: Two times a day (BID) | ORAL | Status: AC

## 2014-02-15 NOTE — Progress Notes (Signed)
Subjective:    Patient ID: [name], male    DOB: 1946-02-03, 68 y.o.   MRN: [id number]  HPI Pt returns for f/u of type 2 DM (dx'ed 1985; he has mild if any neuropathy of the lower extremities; he has associated complications of nephropathy and retinopathy).  no cbg record, but states cbg's are are approx 200.  There is no trend throughout the day.  He takes 80 units qam and 40 units qpm.   no cbg record, but states cbg's are in the high-100's.  It is in general higher as the day goes on.   Pt states few days of rhinorrhea, but no assoc nasal congestion.  He has a chronic dry cough in the chest. Past Medical History  Diagnosis Date  . ALLERGIC RHINITIS 08/03/2007  . BENIGN PROSTATIC HYPERTROPHY 08/03/2007  . Adenomatous colon polyp 06/2001  . 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 DIABETIC RETINOPATHY, BACKGROUND 09/29/2008  . DYSPHAGIA UNSPECIFIED 09/29/2008  . 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 GERD 11/28/2008  . HYPERLIPIDEMIA 08/03/2007  . 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 URINARY CALCULUS 09/29/2008  . Unspecified essential hypertension 01/07/2008  . UNSPECIFIED ANEMIA 12/29/2007  . PAIN IN SOFT TISSUES OF LIMB 01/07/2008    Past Surgical History  Procedure Laterality Date  . Shoulder surgery      left, cyst and tumor removed  . Lithotripsy      History   Social History  . Marital Status: Married    Spouse Name: N/A    Number of Children: 2  . 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 Smoking status: Never Smoker   . Smokeless tobacco: Never Used  . Alcohol Use: Yes     Comment: occasional  . Drug Use: No  . Sexual Activity: Not on file   Other Topics Concern  . Not on file   Social History Narrative   Daily Caffeine Use 1-2 daily    Current Outpatient Prescriptions on File Prior to Visit  Medication Sig Dispense Refill  . aspirin 81 MG tablet Take 81 mg by mouth daily.        Marland Kitchen glucose blood (ONE TOUCH ULTRA TEST) test strip 1 each by Other route 2 (two) times daily. And lancets 2/day 250.43  100 each  12  . Insulin Pen Needle 31G X 5 MM MISC Use as directed 2 X daily. BD ULTRAPFINE MINI. Dx: 250.00  100 each  5  . Lancets (ONETOUCH ULTRASOFT) lancets Use as instructed 2x daily Dx: 250.00  100 each  5  . NON FORMULARY Prostate Peantus  Once daily      . RABEprazole (ACIPHEX) 20 MG tablet Take 20 mg by mouth daily.      . simvastatin (ZOCOR) 40 MG tablet Take 1 tablet (40 mg total) by mouth at bedtime.  30 tablet  3  . tadalafil (CIALIS) 5 MG tablet Take 5 mg by mouth as needed for erectile dysfunction.      . vitamin E 400 UNIT capsule Take 400 Units by mouth daily.       No current facility-administered medications on file prior to visit.    Allergies  Allergen Reactions  . Sulfamethoxazole-Trimethoprim     REACTION: Rash    Family History  Problem Relation Age of Onset  . Diabetes Mother   . Prostate cancer Brother   . Diabetes Brother  x 3 brother  . Heart disease Brother    BP 140/92  Pulse 92  Temp(Src) 98.2 F (36.8 C) (Oral)  Ht 5\' 11"  (1.803 m)  Wt 204 lb (92.534 kg)  BMI 28.46 kg/m2  SpO2 96%  Review of Systems Denies earache and fever.      Objective:   Physical Exam VITAL SIGNS:  See vs page GENERAL: no distress head: no deformity eyes: no periorbital swelling, no proptosis external nose and ears are normal mouth: no lesion seen, but pharynx is red Both eac's and tm's are normal LUNGS:  Clear to auscultation  Lab Results  Component Value Date   HGBA1C 11.4* 02/15/2014      Assessment & Plan:  DM: poor control. noncompliance with f/u: this complicates the rx of DM. Chronic cough, prob allergic.

## 2014-02-15 NOTE — Patient Instructions (Addendum)
check your blood sugar twice a day.  vary the time of day when you check, between before the 3 meals, and at bedtime.  also check if you have symptoms of your blood sugar being too high or too low.  please keep a record of the readings and bring it to your next appointment here.  You can write it on any piece of paper.  please call us sooner if your blood sugar goes below 70, or if you have a lot of readings over 200. i have sent a prescription to your pharmacy, for an antibiotic pill.  Loratadine (non-prescription) will help your runny nose.  Please come back for a follow-up appointment in 1 month.  blood tests and a chest-x-ray, are requested for you today.  We'll contact you with results.  Based on the results, i'll increase your insulin prescription.

## 2014-02-16 ENCOUNTER — Telehealth: Payer: Self-pay

## 2014-02-16 MED ORDER — INSULIN DETEMIR 100 UNIT/ML FLEXPEN: 150.0000 [IU] | PEN_INJECTOR | SUBCUTANEOUS | Status: DC

## 2014-02-16 NOTE — Telephone Encounter (Signed)
Error

## 2014-02-17 ENCOUNTER — Ambulatory Visit: Payer: Medicare Other | Admitting: Endocrinology

## 2014-02-21 ENCOUNTER — Telehealth: Payer: Self-pay | Admitting: *Deleted

## 2014-02-27 ENCOUNTER — Other Ambulatory Visit: Payer: Self-pay

## 2014-02-27 MED ORDER — SIMVASTATIN 40 MG PO TABS: 40.0000 mg | ORAL_TABLET | Freq: Every day | ORAL | Status: DC

## 2014-06-12 ENCOUNTER — Telehealth: Payer: Self-pay

## 2014-06-12 NOTE — Telephone Encounter (Signed)
LVM for pt to call back and schedule CPE with PCP. A1c and LDL needed   Diabetic Bundle Pt.

## 2014-06-30 ENCOUNTER — Ambulatory Visit (INDEPENDENT_AMBULATORY_CARE_PROVIDER_SITE_OTHER): Payer: 59 | Admitting: Podiatrist

## 2014-06-30 ENCOUNTER — Encounter: Payer: Self-pay | Admitting: Podiatrist

## 2014-06-30 ENCOUNTER — Ambulatory Visit (INDEPENDENT_AMBULATORY_CARE_PROVIDER_SITE_OTHER): Payer: 59

## 2014-06-30 VITALS — BP 151/91 | HR 77 | Resp 15 | Ht 72.0 in | Wt 192.0 lb

## 2014-06-30 DIAGNOSIS — M722 Plantar fascial fibromatosis: Secondary | ICD-10-CM

## 2014-06-30 MED ORDER — TRIAMCINOLONE ACETONIDE 10 MG/ML IJ SUSP
10.0000 mg | Freq: Once | INTRAMUSCULAR | Status: AC
  Administered 2014-06-30: 10 mg

## 2014-06-30 MED ORDER — MELOXICAM 15 MG PO TABS: 15.0000 mg | ORAL_TABLET | Freq: Every day | ORAL | Status: DC

## 2014-06-30 NOTE — Patient Instructions (Signed)
Plantar Fasciitis (Heel Spur Syndrome) with Rehab The plantar fascia is a fibrous, ligament-like, soft-tissue structure that spans the bottom of the foot. Plantar fasciitis is a condition that causes pain in the foot due to inflammation of the tissue. SYMPTOMS   Pain and tenderness on the underneath side of the foot.  Pain that worsens with standing or walking. CAUSES  Plantar fasciitis is caused by irritation and injury to the plantar fascia on the underneath side of the foot. Common mechanisms of injury include:  Direct trauma to bottom of the foot.  Damage to a small nerve that runs under the foot where the main fascia attaches to the heel bone. Stress placed on the plantar fascia due to any mild increased activity or injury RISK INCREASES WITH:   Obesity.  Poor strength and flexibility.  Improperly fitted shoes.  Tight calf muscles.  Flat feet.  Failure to warm-up properly before activity.  PREVENTION  Warm up and stretch properly before activity.  Strength, flexibility  Maintain a health body weight.  Avoid stress on the plantar fascia.  Wear properly fitted shoes, including arch supports for individuals who have flat feet. PROGNOSIS  If treated properly, then the symptoms of plantar fasciitis usually resolve without surgery. However, occasionally surgery is necessary. RELATED COMPLICATIONS   Recurrent symptoms that may result in a chronic condition.  Problems of the lower back that are caused by compensating for the injury, such as limping.  Pain or weakness of the foot during push-off following surgery.  Chronic inflammation, scarring, and partial or complete fascia tear, occurring more often from repeated injections. TREATMENT  Treatment initially involves the use of ice and medication to help reduce pain and inflammation. The use of strengthening and stretching exercises may help reduce pain with activity, especially stretches of the Achilles tendon.  Your  caregiver may recommend that you use arch supports to help reduce stress on the plantar fascia. Often, corticosteroid injections are given to reduce inflammation. If symptoms persist for greater than 6 months despite non-surgical (conservative), then surgery may be recommended.  MEDICATION   If pain medication is necessary, then nonsteroidal anti-inflammatory medications, such as aspirin and ibuprofen, or other minor pain relievers, such as acetaminophen, are often recommended. Corticosteroid injections may be given by your caregiver.  HEAT AND COLD  Cold treatment (icing) relieves pain and reduces inflammation. Cold treatment should be applied for 10 to 15 minutes every 2 to 3 hours for inflammation and pain and immediately after any activity that aggravates your symptoms. Use ice packs or massage the area with a piece of ice (ice massage).  Heat treatment may be used prior to performing the stretching and strengthening activities prescribed by your caregiver, physical therapist, or athletic trainer. Use a heat pack or soak the injury in warm water. SEEK IMMEDIATE MEDICAL CARE IF:  Treatment seems to offer no benefit, or the condition worsens.  Any medications produce adverse side effects.    EXERCISES-- perform each exercise a total of 10-15 repetitions.  Hold for 30 seconds and perform 3 times per day   RANGE OF MOTION (ROM) AND STRETCHING EXERCISES - Plantar Fasciitis (Heel Spur Syndrome) These exercises may help you when beginning to rehabilitate your injury.   While completing these exercises, remember:   Restoring tissue flexibility helps normal motion to return to the joints. This allows healthier, less painful movement and activity.  An effective stretch should be held for at least 30 seconds.  A stretch should never be painful. You   should only feel a gentle lengthening or release in the stretched tissue. RANGE OF MOTION - Toe Extension, Flexion  Sit with your right / left  leg crossed over your opposite knee.  Grasp your toes and gently pull them back toward the top of your foot. You should feel a stretch on the bottom of your toes and/or foot.  Hold this stretch for __________ seconds.  Now, gently pull your toes toward the bottom of your foot. You should feel a stretch on the top of your toes and or foot.  Hold this stretch for __________ seconds. Repeat __________ times. Complete this stretch __________ times per day.  RANGE OF MOTION - Ankle Dorsiflexion, Active Assisted  Remove shoes and sit on a chair that is preferably not on a carpeted surface.  Place right / left foot under knee. Extend your opposite leg for support.  Keeping your heel down, slide your right / left foot back toward the chair until you feel a stretch at your ankle or calf. If you do not feel a stretch, slide your bottom forward to the edge of the chair, while still keeping your heel down.  Hold this stretch for __________ seconds. Repeat __________ times. Complete this stretch __________ times per day.  STRETCH  Gastroc, Standing  Place hands on wall.  Extend right / left leg, keeping the front knee somewhat bent.  Slightly point your toes inward on your back foot.  Keeping your right / left heel on the floor and your knee straight, shift your weight toward the wall, not allowing your back to arch.  You should feel a gentle stretch in the right / left calf. Hold this position for __________ seconds. Repeat __________ times. Complete this stretch __________ times per day. STRETCH  Soleus, Standing  Place hands on wall.  Extend right / left leg, keeping the other knee somewhat bent.  Slightly point your toes inward on your back foot.  Keep your right / left heel on the floor, bend your back knee, and slightly shift your weight over the back leg so that you feel a gentle stretch deep in your back calf.  Hold this position for __________ seconds. Repeat __________ times.  Complete this stretch __________ times per day. STRETCH  Gastrocsoleus, Standing  Note: This exercise can place a lot of stress on your foot and ankle. Please complete this exercise only if specifically instructed by your caregiver.   Place the Peschke of your right / left foot on a step, keeping your other foot firmly on the same step.  Hold on to the wall or a rail for balance.  Slowly lift your other foot, allowing your body weight to press your heel down over the edge of the step.  You should feel a stretch in your right / left calf.  Hold this position for __________ seconds.  Repeat this exercise with a slight bend in your right / left knee. Repeat __________ times. Complete this stretch __________ times per day.  STRENGTHENING EXERCISES - Plantar Fasciitis (Heel Spur Syndrome)  These exercises may help you when beginning to rehabilitate your injury. They may resolve your symptoms with or without further involvement from your physician, physical therapist or athletic trainer. While completing these exercises, remember:   Muscles can gain both the endurance and the strength needed for everyday activities through controlled exercises.  Complete these exercises as instructed by your physician, physical therapist or athletic trainer. Progress the resistance and repetitions only as guided.   

## 2014-06-30 NOTE — Progress Notes (Signed)
   Subjective:    Patient ID: [name], male    DOB: 07/17/1946, 68 y.o.   MRN: [id number]  HPI Comments: N plantar fasciitis L left heel and inferior arch D 2 weeks O  C sharp pain A concrete, walking after resting T none  Diabetes      Review of Systems  All other systems reviewed and are negative.      Objective:   Physical Exam  GENERAL APPEARANCE: Alert, conversant. Appropriately groomed. No acute distress.  VASCULAR: Pedal pulses palpable at 2/4 DP and PT bilateral.  Capillary refill time is immediate to all digits,  Proximal to distal cooling it warm to warm.  Digital hair growth is present bilateral  NEUROLOGIC: sensation is intact epicritically and protectively to 5.07 monofilament at 5/5 sites bilateral.  Light touch is intact bilateral, vibratory sensation intact bilateral, achilles tendon reflex is intact bilateral.  MUSCULOSKELETAL: high arched foot type is noted left. Prominent fifth metatarsal base is present. Pain along the plantar lateral aspect of the left heel is noted.  DERMATOLOGIC: skin color, texture, and turger are within normal limits.  No preulcerative lesions are seen, no interdigital maceration noted.  No open lesions present.  Digital nails are asymptomatic.     Assessment & Plan:  Plantar fasciitis lateral band  Discussed etiology, pathology, conservative vs. Surgical therapies and at this time a plantar fascial injection was recommended.  The patient agreed and a sterile skin prep was applied.  An injection consisting of kenalog and marcaine mixture was infiltrated at the point of maximal tenderness on the left Heel.  The patient tolerated this well and was given instructions for aftercare. A removable taping was applied. We discussed the positive long-term benefits of orthotics. Prescription for anti-inflammatory medication called in.

## 2014-07-05 ENCOUNTER — Ambulatory Visit: Payer: 59 | Admitting: Podiatry

## 2014-07-14 ENCOUNTER — Telehealth: Payer: Self-pay

## 2014-07-14 NOTE — Telephone Encounter (Signed)
LVM for pt to call back and schedule CPE with PCP. A1c and LDL needed

## 2014-07-25 ENCOUNTER — Telehealth: Payer: Self-pay | Admitting: Endocrinology

## 2014-07-25 MED ORDER — INSULIN DETEMIR 100 UNIT/ML FLEXPEN: 150.0000 [IU] | PEN_INJECTOR | SUBCUTANEOUS | Status: DC

## 2014-07-25 NOTE — Telephone Encounter (Signed)
Rx sent to pharmacy per request.  

## 2014-07-25 NOTE — Telephone Encounter (Signed)
Please call in levemir for pt 70-80 in AM and 40-60 in PM

## 2014-08-07 ENCOUNTER — Encounter: Payer: Self-pay | Admitting: Endocrinology

## 2014-08-07 ENCOUNTER — Ambulatory Visit (INDEPENDENT_AMBULATORY_CARE_PROVIDER_SITE_OTHER): Payer: Medicare Other | Admitting: Endocrinology

## 2014-08-07 VITALS — BP 118/62 | HR 72 | Temp 98.7°F | Ht 72.0 in | Wt 198.0 lb

## 2014-08-07 DIAGNOSIS — Z125 Encounter for screening for malignant neoplasm of prostate: Secondary | ICD-10-CM

## 2014-08-07 DIAGNOSIS — E1129 Type 2 diabetes mellitus with other diabetic kidney complication: Secondary | ICD-10-CM

## 2014-08-07 DIAGNOSIS — R809 Proteinuria, unspecified: Secondary | ICD-10-CM

## 2014-08-07 DIAGNOSIS — Z0000 Encounter for general adult medical examination without abnormal findings: Secondary | ICD-10-CM

## 2014-08-07 DIAGNOSIS — E7800 Pure hypercholesterolemia, unspecified: Secondary | ICD-10-CM

## 2014-08-07 DIAGNOSIS — E785 Hyperlipidemia, unspecified: Secondary | ICD-10-CM

## 2014-08-07 DIAGNOSIS — I10 Essential (primary) hypertension: Secondary | ICD-10-CM

## 2014-08-07 DIAGNOSIS — Z79899 Other long term (current) drug therapy: Secondary | ICD-10-CM

## 2014-08-07 DIAGNOSIS — N209 Urinary calculus, unspecified: Secondary | ICD-10-CM

## 2014-08-07 DIAGNOSIS — R319 Hematuria, unspecified: Secondary | ICD-10-CM | POA: Insufficient documentation

## 2014-08-07 DIAGNOSIS — D649 Anemia, unspecified: Secondary | ICD-10-CM

## 2014-08-07 LAB — URINALYSIS, ROUTINE W REFLEX MICROSCOPIC
Bilirubin Urine: NEGATIVE
Ketones, ur: NEGATIVE
LEUKOCYTES UA: NEGATIVE
Nitrite: NEGATIVE
SPECIFIC GRAVITY, URINE: 1.015 (ref 1.000–1.030)
UROBILINOGEN UA: 0.2 (ref 0.0–1.0)
Urine Glucose: >=1000 — AB
WBC UA: NONE SEEN (ref 0–?)
pH: 5.5 (ref 5.0–8.0)

## 2014-08-07 LAB — LIPID PANEL
Cholesterol: 124 mg/dL (ref 0–200)
HDL: 42.1 mg/dL (ref >39.00)
LDL Cholesterol: 55 mg/dL (ref 0–99)
NONHDL: 81.9
Total CHOL/HDL Ratio: 3
Triglycerides: 136 mg/dL (ref 0.0–149.0)
VLDL: 27.2 mg/dL (ref 0.0–40.0)

## 2014-08-07 LAB — HEPATIC FUNCTION PANEL
ALT: 31 U/L (ref 0–53)
AST: 28 U/L (ref 0–37)
Albumin: 4.2 g/dL (ref 3.5–5.2)
Alkaline Phosphatase: 68 U/L (ref 39–117)
BILIRUBIN TOTAL: 0.8 mg/dL (ref 0.2–1.2)
Bilirubin, Direct: 0.2 mg/dL (ref 0.0–0.3)
Total Protein: 7.9 g/dL (ref 6.0–8.3)

## 2014-08-07 LAB — CBC WITH DIFFERENTIAL/PLATELET
BASOS ABS: 0 10*3/uL (ref 0.0–0.1)
BASOS PCT: 0.3 % (ref 0.0–3.0)
Eosinophils Absolute: 0.4 10*3/uL (ref 0.0–0.7)
Eosinophils Relative: 5.9 % — ABNORMAL HIGH (ref 0.0–5.0)
HCT: 48 % (ref 39.0–52.0)
Hemoglobin: 16.4 g/dL (ref 13.0–17.0)
LYMPHS PCT: 34.2 % (ref 12.0–46.0)
Lymphs Abs: 2.1 10*3/uL (ref 0.7–4.0)
MCHC: 34.1 g/dL (ref 30.0–36.0)
MCV: 91.6 fl (ref 78.0–100.0)
Monocytes Absolute: 0.5 10*3/uL (ref 0.1–1.0)
Monocytes Relative: 8 % (ref 3.0–12.0)
NEUTROS PCT: 51.6 % (ref 43.0–77.0)
Neutro Abs: 3.2 10*3/uL (ref 1.4–7.7)
Platelets: 199 10*3/uL (ref 150.0–400.0)
RBC: 5.24 Mil/uL (ref 4.22–5.81)
RDW: 15.1 % (ref 11.5–15.5)
WBC: 6.1 10*3/uL (ref 4.0–10.5)

## 2014-08-07 LAB — BASIC METABOLIC PANEL
BUN: 19 mg/dL (ref 6–23)
CALCIUM: 9.9 mg/dL (ref 8.4–10.5)
CO2: 27 mEq/L (ref 19–32)
Chloride: 100 mEq/L (ref 96–112)
Creatinine, Ser: 1.2 mg/dL (ref 0.4–1.5)
GFR: 67.58 mL/min (ref >60.00)
GLUCOSE: 357 mg/dL — AB (ref 70–99)
Potassium: 5.4 mEq/L — ABNORMAL HIGH (ref 3.5–5.1)
Sodium: 135 mEq/L (ref 135–145)

## 2014-08-07 LAB — PSA: PSA: 2.84 ng/mL (ref 0.10–4.00)

## 2014-08-07 LAB — MICROALBUMIN / CREATININE URINE RATIO
Creatinine,U: 52.8 mg/dL
MICROALB UR: 12.9 mg/dL — AB (ref 0.0–1.9)
MICROALB/CREAT RATIO: 24.4 mg/g (ref 0.0–30.0)

## 2014-08-07 LAB — TSH: TSH: 1 u[IU]/mL (ref 0.35–4.50)

## 2014-08-07 LAB — IBC PANEL
Iron: 164 ug/dL (ref 42–165)
SATURATION RATIOS: 37.1 % (ref 20.0–50.0)
Transferrin: 315.8 mg/dL (ref 212.0–360.0)

## 2014-08-07 LAB — HEMOGLOBIN A1C: Hgb A1c MFr Bld: 10.1 % — ABNORMAL HIGH (ref 4.6–6.5)

## 2014-08-07 MED ORDER — INSULIN DETEMIR 100 UNIT/ML FLEXPEN: 120.0000 [IU] | PEN_INJECTOR | SUBCUTANEOUS | Status: DC

## 2014-08-07 MED ORDER — LACTULOSE 20 GM/30ML PO SOLN: 30.0000 mL | Freq: Two times a day (BID) | ORAL | Status: DC

## 2014-08-07 NOTE — Patient Instructions (Addendum)
Please come in for your regular physical after 09/07/14. blood tests are being requested for you today.  We'll contact you with results. i have sent a prescription to your pharmacy, for the bowels.   Please change the insulin to 120 units each morning.  On this type of insulin schedule, you should eat meals on a regular schedule.  If a meal is missed or significantly delayed, your blood sugar could go low.

## 2014-08-07 NOTE — Progress Notes (Signed)
Subjective:    Patient ID: [name], male    DOB: Feb 25, 1946, 68 y.o.   MRN: [id number]  HPI Pt returns for f/u of type 2 DM (dx'ed 1941; complicated by nephropathy and retinopathy).  he takes 80 units qam and 40 units qpm. no cbg record, but states cbg's vary from 55-300.  It is in general higher as the day goes on.  He says he never misses the insulin.   Past Medical History  Diagnosis Date  . ALLERGIC RHINITIS 08/03/2007  . BENIGN PROSTATIC HYPERTROPHY 08/03/2007  . Adenomatous colon polyp 06/2001  . 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 DIABETIC RETINOPATHY, BACKGROUND 09/29/2008  . DYSPHAGIA UNSPECIFIED 09/29/2008  . 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 GERD 11/28/2008  . HYPERLIPIDEMIA 08/03/2007  . 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 URINARY CALCULUS 09/29/2008  . Unspecified essential hypertension 01/07/2008  . UNSPECIFIED ANEMIA 12/29/2007  . PAIN IN SOFT TISSUES OF LIMB 01/07/2008    Past Surgical History  Procedure Laterality Date  . Shoulder surgery      left, cyst and tumor removed  . Lithotripsy      History   Social History  . Marital Status: Married    Spouse Name: N/A    Number of Children: 2  . 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 Smoking status: Never Smoker   . Smokeless tobacco: Never Used  . Alcohol Use: Yes     Comment: occasional  . Drug Use: No  . Sexual Activity: Not on file   Other Topics Concern  . Not on file   Social History Narrative   Daily Caffeine Use 1-2 daily    Current Outpatient Prescriptions on File Prior to Visit  Medication Sig Dispense Refill  . aspirin 81 MG tablet Take 81 mg by mouth daily.      Marland Kitchen glucose blood (ONE TOUCH ULTRA TEST) test strip 1 each by Other route 2 (two) times daily. And lancets 2/day 250.43  100 each  12  . Insulin Pen Needle 31G X 5 MM MISC Use as directed 2 X daily. BD ULTRAPFINE MINI. Dx: 250.00  100 each  5  .  Lancets (ONETOUCH ULTRASOFT) lancets Use as instructed 2x daily Dx: 250.00  100 each  5  . meloxicam (MOBIC) 15 MG tablet Take 1 tablet (15 mg total) by mouth daily.  30 tablet  0  . NON FORMULARY Prostate Peantus  Once daily      . RABEprazole (ACIPHEX) 20 MG tablet Take 20 mg by mouth daily.      . simvastatin (ZOCOR) 40 MG tablet Take 1 tablet (40 mg total) by mouth at bedtime.  30 tablet  3  . tadalafil (CIALIS) 5 MG tablet Take 5 mg by mouth as needed for erectile dysfunction.      . vitamin E 400 UNIT capsule Take 400 Units by mouth daily.       No current facility-administered medications on file prior to visit.    Allergies  Allergen Reactions  . Sulfamethoxazole-Trimethoprim     REACTION: Rash    Family History  Problem Relation Age of Onset  . Diabetes Mother   . Prostate cancer Brother   . Diabetes Brother     x 3 brother  . Heart disease Brother     BP 118/62  Pulse 72  Temp(Src) 98.7 F (37.1 C) (Oral)  Ht  6' (1.829 m)  Wt 198 lb (89.812 kg)  BMI 26.85 kg/m2  SpO2 95%   Review of Systems Denies LOC and weight change.  He has constipation.      Objective:   Physical Exam VITAL SIGNS:  See vs page GENERAL: no distress Pulses: dorsalis pedis intact bilat.   Feet: no deformity.  no edema Skin:  no ulcer on the feet.  normal color and temp. Neuro: sensation is intact to touch on the feet.    Lab Results  Component Value Date   WBC 6.1 08/07/2014   HGB 16.4 08/07/2014   HCT 48.0 08/07/2014   PLT 199.0 08/07/2014   GLUCOSE 357* 08/07/2014   CHOL 124 08/07/2014   TRIG 136.0 08/07/2014   HDL 42.10 08/07/2014   LDLCALC 55 08/07/2014   ALT 31 08/07/2014   AST 28 08/07/2014   NA 135 08/07/2014   K 5.4* 08/07/2014   CL 100 08/07/2014   CREATININE 1.2 08/07/2014   BUN 19 08/07/2014   CO2 27 08/07/2014   TSH 1.00 08/07/2014   PSA 2.84 08/07/2014   HGBA1C 10.1* 08/07/2014   MICROALBUR 12.9* 08/07/2014      Assessment & Plan:  Hematuria, new.  DM: severe  exacerbation.  Noncompliance with cbg recording and f/u appts: I'll work around this as best I can.   Constipation, new.   Patient is advised the following: Patient Instructions  Please come in for your regular physical after 09/07/14. blood tests are being requested for you today.  We'll contact you with results. i have sent a prescription to your pharmacy, for the bowels.   Please change the insulin to 120 units each morning.  On this type of insulin schedule, you should eat meals on a regular schedule.  If a meal is missed or significantly delayed, your blood sugar could go low.

## 2014-08-08 LAB — PTH, INTACT AND CALCIUM
Calcium: 9.7 mg/dL (ref 8.4–10.5)
PTH: 26 pg/mL (ref 14–64)

## 2014-08-22 ENCOUNTER — Telehealth: Payer: Self-pay | Admitting: Endocrinology

## 2014-08-22 ENCOUNTER — Other Ambulatory Visit: Payer: Self-pay

## 2014-08-22 MED ORDER — GLUCOSE BLOOD VI STRP: 1.0000 | ORAL_STRIP | Freq: Two times a day (BID) | Status: DC

## 2014-08-22 NOTE — Telephone Encounter (Signed)
Rx sent to pharmacy. 9/29.

## 2014-08-22 NOTE — Telephone Encounter (Signed)
Patient would like his One touch test strips called in   Thank you :)

## 2014-08-30 ENCOUNTER — Telehealth: Payer: Self-pay

## 2014-08-30 NOTE — Telephone Encounter (Signed)
Left a message for call back.  Called patient regarding diabetic eye exam.  When patient calls back please ask:  Have you had a recent (2014-2015) eye exam?    Date of Exam?  Where?    

## 2014-09-14 NOTE — Telephone Encounter (Signed)
error 

## 2014-09-22 NOTE — Telephone Encounter (Signed)
Left a message for call back.  

## 2014-11-02 ENCOUNTER — Other Ambulatory Visit: Payer: Self-pay

## 2014-11-02 MED ORDER — SIMVASTATIN 40 MG PO TABS: 40.0000 mg | ORAL_TABLET | Freq: Every day | ORAL | Status: DC

## 2014-11-10 ENCOUNTER — Telehealth: Payer: Self-pay

## 2014-11-10 NOTE — Telephone Encounter (Signed)
Requested call back to discuss flu shot.

## 2015-03-05 DIAGNOSIS — N486 Induration penis plastica: Secondary | ICD-10-CM | POA: Diagnosis not present

## 2015-03-08 DIAGNOSIS — K5900 Constipation, unspecified: Secondary | ICD-10-CM | POA: Diagnosis not present

## 2015-03-08 DIAGNOSIS — K648 Other hemorrhoids: Secondary | ICD-10-CM | POA: Diagnosis not present

## 2015-03-08 DIAGNOSIS — K644 Residual hemorrhoidal skin tags: Secondary | ICD-10-CM | POA: Diagnosis not present

## 2015-03-23 ENCOUNTER — Telehealth: Payer: Self-pay | Admitting: Endocrinology

## 2015-03-23 NOTE — Telephone Encounter (Signed)
I contacted patient and advised we do not have a BP medication listed on current list. Patient is coming in for a appointment on 03/26/2015.BP medication will be addressed at next office visit.

## 2015-03-23 NOTE — Telephone Encounter (Signed)
Pt needs refill on blood pressure med please

## 2015-03-27 ENCOUNTER — Encounter: Payer: Self-pay | Admitting: Gastroenterology

## 2015-03-27 ENCOUNTER — Ambulatory Visit (INDEPENDENT_AMBULATORY_CARE_PROVIDER_SITE_OTHER): Payer: Self-pay | Admitting: Endocrinology

## 2015-03-27 ENCOUNTER — Encounter: Payer: Self-pay | Admitting: Endocrinology

## 2015-03-27 VITALS — BP 144/92 | HR 69 | Temp 97.9°F | Ht 72.0 in | Wt 198.0 lb

## 2015-03-27 DIAGNOSIS — N058 Unspecified nephritic syndrome with other morphologic changes: Secondary | ICD-10-CM

## 2015-03-27 DIAGNOSIS — E1121 Type 2 diabetes mellitus with diabetic nephropathy: Secondary | ICD-10-CM

## 2015-03-27 DIAGNOSIS — Z23 Encounter for immunization: Secondary | ICD-10-CM

## 2015-03-27 DIAGNOSIS — E1129 Type 2 diabetes mellitus with other diabetic kidney complication: Secondary | ICD-10-CM

## 2015-03-27 LAB — HEMOGLOBIN A1C: HEMOGLOBIN A1C: 10.1 % — AB (ref 4.6–6.5)

## 2015-03-27 MED ORDER — INSULIN GLARGINE 100 UNIT/ML SOLOSTAR PEN: 100.0000 [IU] | PEN_INJECTOR | SUBCUTANEOUS | Status: DC

## 2015-03-27 NOTE — Progress Notes (Signed)
we discussed code status.  pt requests full code, but would not want to be started or maintained on artificial life-support measures if there was not a reasonable chance of recovery 

## 2015-03-27 NOTE — Patient Instructions (Addendum)
Please come in for your regular physical in 2 months. blood tests are being requested for you today.  We'll contact you with results.  On this type of insulin schedule, you should eat meals on a regular schedule.  If a meal is missed or significantly delayed, your blood sugar could go low. We'll recheck the blood pressure again when you come back.   check your blood sugar twice a day.  vary the time of day when you check, between before the 3 meals, and at bedtime.  also check if you have symptoms of your blood sugar being too high or too low.  please keep a record of the readings and bring it to your next appointment here.  You can write it on any piece of paper.  please call us sooner if your blood sugar goes below 70, or if you have a lot of readings over 200.  good diet and exercise significantly improve the control of your diabetes.  please let me know if you wish to be referred to a dietician.  high blood sugar is very risky to your health.  you should see an eye doctor and dentist every year.  It is very important to get all recommended vaccinations.  please consider these measures for your health:  minimize alcohol.  do not use tobacco products.  have a colonoscopy at least every 10 years from age 26.  keep firearms safely stored.  always use seat belts.  have working smoke alarms in your home.  see an eye doctor and dentist regularly.  never drive under the influence of alcohol or drugs (including prescription drugs).  those with fair skin should take precautions against the sun. it is critically important to prevent falling down (keep floor areas well-lit, dry, and free of loose objects.  If you have a cane, walker, or wheelchair, you should use it, even for short trips around the house.  Also, try not to rush)

## 2015-03-27 NOTE — Progress Notes (Signed)
Subjective:    Patient ID: [name], male    DOB: 1946/10/10, 69 y.o.   MRN: [id number]  HPI Pt returns for f/u of diabetes mellitus: DM type: Insulin-requiring type 2 Dx'ed: 7939 Complications: nephropathy and retinopathy Therapy: insulin since soon after dx.   DKA: never Severe hypoglycemia: never Pancreatitis: never Other: he is on a simple qd insulin schedule, due to noncompliance. Interval history: no cbg record, but states cbg's vary from 110-200.  There is no trend throughout the day, except it is lowest in the afternoon. Pt says he seldom misses the insulin altogether.  pt states he feels well in general. Past Medical History  Diagnosis Date  . ALLERGIC RHINITIS 08/03/2007  . BENIGN PROSTATIC HYPERTROPHY 08/03/2007  . Adenomatous colon polyp 06/2001  . 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 DIABETIC RETINOPATHY, BACKGROUND 09/29/2008  . DYSPHAGIA UNSPECIFIED 09/29/2008  . 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 GERD 11/28/2008  . HYPERLIPIDEMIA 08/03/2007  . 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 URINARY CALCULUS 09/29/2008  . Unspecified essential hypertension 01/07/2008  . UNSPECIFIED ANEMIA 12/29/2007  . PAIN IN SOFT TISSUES OF LIMB 01/07/2008    Past Surgical History  Procedure Laterality Date  . Shoulder surgery      left, cyst and tumor removed  . Lithotripsy      History   Social History  . Marital Status: Married    Spouse Name: N/A  . Number of Children: 2  . 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 Smoking status: Never Smoker   . Smokeless tobacco: Never Used  . Alcohol Use: Yes     Comment: occasional  . Drug Use: No  . Sexual Activity: Not on file   Other Topics Concern  . Not on file   Social History Narrative   Daily Caffeine Use 1-2 daily    Current Outpatient Prescriptions on File Prior to Visit  Medication Sig Dispense Refill  . aspirin 81 MG tablet Take 81  mg by mouth daily.    Marland Kitchen glucose blood (ONE TOUCH ULTRA TEST) test strip 1 each by Other route 2 (two) times daily. And lancets 2/day 250.43 100 each 12  . Insulin Pen Needle 31G X 5 MM MISC Use as directed 2 X daily. BD ULTRAPFINE MINI. Dx: 250.00 100 each 5  . Lancets (ONETOUCH ULTRASOFT) lancets Use as instructed 2x daily Dx: 250.00 100 each 5  . NON FORMULARY Prostate Peantus  Once daily    . RABEprazole (ACIPHEX) 20 MG tablet Take 20 mg by mouth daily.    . simvastatin (ZOCOR) 40 MG tablet Take 1 tablet (40 mg total) by mouth at bedtime. 30 tablet 3  . vitamin E 400 UNIT capsule Take 400 Units by mouth daily.    . Lactulose 20 GM/30ML SOLN Take 30 mLs (20 g total) by mouth 2 (two) times daily. (Patient not taking: Reported on 03/27/2015) 1892 mL 11  . meloxicam (MOBIC) 15 MG tablet Take 1 tablet (15 mg total) by mouth daily. (Patient not taking: Reported on 03/27/2015) 30 tablet 0  . tadalafil (CIALIS) 5 MG tablet Take 5 mg by mouth as needed for erectile dysfunction.     No current facility-administered medications on file prior to visit.    Allergies  Allergen Reactions  . Sulfamethoxazole-Trimethoprim     REACTION: Rash    Family History  Problem Relation Age of Onset  .  Diabetes Mother   . Prostate cancer Brother   . Diabetes Brother     x 3 brother  . Heart disease Brother     BP 144/92 mmHg  Pulse 69  Temp(Src) 97.9 F (36.6 C) (Oral)  Ht 6' (1.829 m)  Wt 198 lb (89.812 kg)  BMI 26.85 kg/m2  SpO2 95%  Review of Systems He denies hypoglycemia and weight change.     Objective:   Physical Exam VITAL SIGNS:  See vs page.  GENERAL: no distress. Pulses: dorsalis pedis intact bilat.   MSK: no deformity of the feet. CV: no leg edema.   Skin:  no ulcer on the feet.  normal color and temp on the feet. Neuro: sensation is intact to touch on the feet.  Ext: There is bilateral onychomycosis of the toenails.   Lab Results  Component Value Date   HGBA1C 10.1*  03/27/2015      Assessment & Plan:  DM, with ongoing poor control.  He needs further simplification in his regimen. change levemir to lantus, 100 units qam. HTN: new.  We'll follow for now, as this might be situational   Subjective:   Patient here for Medicare annual wellness visit and management of other chronic and acute problems.     Risk factors: advanced age    41 of Physicians Providing Medical Care to Patient:  See "snapshot"   Activities of Daily Living: In your present state of health, do you have any difficulty performing the following activities?:  Preparing food and eating?: No  Bathing yourself: No  Getting dressed: No  Using the toilet:No  Moving around from place to place: No  In the past year have you fallen or had a near fall?: No    Home Safety: Has smoke detector and wears seat belts. Firearms are safely stored. No excess sun exposure.   Diet and Exercise  Current exercise habits: pt says not very good Dietary issues discussed: pt says his diet is not good.  Depression Screen  Q1: Over the past two weeks, have you felt down, depressed or hopeless? no  Q2: Over the past two weeks, have you felt little interest or pleasure in doing things? no   The following portions of the patient's history were reviewed and updated as appropriate: allergies, current medications, past family history, past medical history, past social history, past surgical history and problem list.   Review of Systems  Denies visual loss.  No change in chronic hearing loss. Objective:   Vision:  Advertising account executive, but does not recall name Hearing: grossly normal Body mass index:  See vs page Msk: pt easily and quickly performs "get-up-and-go" from a sitting position Cognitive Impairment Assessment: cognition, memory and judgment appear normal.  remembers 3/3 at 5 minutes.  excellent recall.  can easily read and write a sentence.  alert and oriented x 3.   Assessment:   Medicare  wellness utd on preventive parameters    Plan:   During the course of the visit the patient was educated and counseled about appropriate screening and preventive services including:        Fall prevention    Diabetes screening  Nutrition counseling   Vaccines / LABS Zostavax / Pneumococcal Vaccine  today  PSA  Patient Instructions (the written plan) was given to the patient.

## 2015-03-29 DIAGNOSIS — M720 Palmar fascial fibromatosis [Dupuytren]: Secondary | ICD-10-CM | POA: Diagnosis not present

## 2015-03-29 DIAGNOSIS — M7712 Lateral epicondylitis, left elbow: Secondary | ICD-10-CM | POA: Diagnosis not present

## 2015-04-11 ENCOUNTER — Telehealth: Payer: Self-pay | Admitting: Endocrinology

## 2015-04-11 NOTE — Telephone Encounter (Signed)
i would be happy to change the levemir, but it would be easier to just increase the lantus.   Is that ok?

## 2015-04-11 NOTE — Telephone Encounter (Signed)
See note below and please advise, Thanks! 

## 2015-04-11 NOTE — Telephone Encounter (Signed)
°  Lantus is not working blood sugar 200+   Levemir works best for the patient and would like to stay with this medication   Pharmacy: [facility]   Thank you

## 2015-04-12 NOTE — Telephone Encounter (Signed)
Requested call back from patient to discuss.  

## 2015-04-13 MED ORDER — INSULIN DETEMIR 100 UNIT/ML FLEXPEN: 150.0000 [IU] | PEN_INJECTOR | Freq: Every day | SUBCUTANEOUS | Status: DC

## 2015-04-13 NOTE — Telephone Encounter (Signed)
Patient advised of note below and voiced understanding.  

## 2015-04-13 NOTE — Telephone Encounter (Signed)
Ok, i have sent a prescription to your pharmacy, to change to levemir. We are starting at this number of units. We'll probably need to change

## 2015-04-13 NOTE — Telephone Encounter (Signed)
I contacted the patient he states he would prefer to stay with Levemir and would like for a rx sent to his pharmacy.

## 2015-04-24 ENCOUNTER — Other Ambulatory Visit: Payer: Self-pay

## 2015-04-24 MED ORDER — SIMVASTATIN 40 MG PO TABS: 40.0000 mg | ORAL_TABLET | Freq: Every day | ORAL | Status: DC

## 2015-04-25 DIAGNOSIS — M720 Palmar fascial fibromatosis [Dupuytren]: Secondary | ICD-10-CM | POA: Diagnosis not present

## 2015-04-25 DIAGNOSIS — M7711 Lateral epicondylitis, right elbow: Secondary | ICD-10-CM | POA: Diagnosis not present

## 2015-04-30 DIAGNOSIS — M7712 Lateral epicondylitis, left elbow: Secondary | ICD-10-CM | POA: Diagnosis not present

## 2015-04-30 DIAGNOSIS — M720 Palmar fascial fibromatosis [Dupuytren]: Secondary | ICD-10-CM | POA: Diagnosis not present

## 2015-05-29 ENCOUNTER — Ambulatory Visit (INDEPENDENT_AMBULATORY_CARE_PROVIDER_SITE_OTHER): Payer: 59 | Admitting: Endocrinology

## 2015-05-29 ENCOUNTER — Encounter: Payer: Self-pay | Admitting: Endocrinology

## 2015-05-29 VITALS — BP 136/94 | HR 119 | Temp 97.9°F | Ht 72.0 in | Wt 192.0 lb

## 2015-05-29 DIAGNOSIS — H9110 Presbycusis, unspecified ear: Secondary | ICD-10-CM

## 2015-05-29 DIAGNOSIS — E1121 Type 2 diabetes mellitus with diabetic nephropathy: Secondary | ICD-10-CM

## 2015-05-29 DIAGNOSIS — Z0000 Encounter for general adult medical examination without abnormal findings: Secondary | ICD-10-CM

## 2015-05-29 LAB — POCT GLYCOSYLATED HEMOGLOBIN (HGB A1C): Hemoglobin A1C: 11.1

## 2015-05-29 MED ORDER — OMEPRAZOLE 40 MG PO CPDR: 40.0000 mg | DELAYED_RELEASE_CAPSULE | Freq: Every day | ORAL | Status: DC

## 2015-05-29 MED ORDER — INSULIN DETEMIR 100 UNIT/ML FLEXPEN: 100.0000 [IU] | PEN_INJECTOR | Freq: Two times a day (BID) | SUBCUTANEOUS | Status: DC

## 2015-05-29 NOTE — Patient Instructions (Addendum)
please consider these measures for your health:  minimize alcohol.  do not use tobacco products.  have a colonoscopy at least every 10 years from age 69.  keep firearms safely stored.  always use seat belts.  have working smoke alarms in your home.  see an eye doctor and dentist regularly.  never drive under the influence of alcohol or drugs (including prescription drugs).  those with fair skin should take precautions against the sun. Please increase the insulin to 100 units, twice a day.  Please take this no matter what your blood sugar is.   i have sent a prescription to your pharmacy, for the heartburn.  Please see a hearing specialist.  you will receive a phone call, about a day and time for an appointment. Please come back for a follow-up appointment in 3 months.

## 2015-05-29 NOTE — Progress Notes (Signed)
Subjective:    Patient ID: [name], male    DOB: Jan 27, 1946, 69 y.o.   MRN: [id number]  HPI Pt is here for regular wellness examination, and is feeling pretty well in general, and says chronic med probs are stable, except as noted below Past Medical History  Diagnosis Date  . ALLERGIC RHINITIS 08/03/2007  . BENIGN PROSTATIC HYPERTROPHY 08/03/2007  . Adenomatous colon polyp 06/2001  . 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 DIABETIC RETINOPATHY, BACKGROUND 09/29/2008  . DYSPHAGIA UNSPECIFIED 09/29/2008  . 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 GERD 11/28/2008  . HYPERLIPIDEMIA 08/03/2007  . 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 URINARY CALCULUS 09/29/2008  . Unspecified essential hypertension 01/07/2008  . UNSPECIFIED ANEMIA 12/29/2007  . PAIN IN SOFT TISSUES OF LIMB 01/07/2008    Past Surgical History  Procedure Laterality Date  . Shoulder surgery      left, cyst and tumor removed  . Lithotripsy      History   Social History  . Marital Status: Married    Spouse Name: N/A  . Number of Children: 2  . 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 Smoking status: Never Smoker   . Smokeless tobacco: Never Used  . Alcohol Use: Yes     Comment: occasional  . Drug Use: No  . Sexual Activity: Not on file   Other Topics Concern  . Not on file   Social History Narrative   Daily Caffeine Use 1-2 daily    Current Outpatient Prescriptions on File Prior to Visit  Medication Sig Dispense Refill  . aspirin 81 MG tablet Take 81 mg by mouth daily.    Marland Kitchen glucose blood (ONE TOUCH ULTRA TEST) test strip 1 each by Other route 2 (two) times daily. And lancets 2/day 250.43 100 each 12  . Insulin Pen Needle 31G X 5 MM MISC Use as directed 2 X daily. BD ULTRAPFINE MINI. Dx: 250.00 100 each 5  . Lactulose 20 GM/30ML SOLN Take 30 mLs (20 g total) by mouth 2 (two) times daily. 1892 mL 11  . Lancets (ONETOUCH ULTRASOFT)  lancets Use as instructed 2x daily Dx: 250.00 100 each 5  . meloxicam (MOBIC) 15 MG tablet Take 1 tablet (15 mg total) by mouth daily. 30 tablet 0  . NON FORMULARY Prostate Peantus  Once daily    . simvastatin (ZOCOR) 40 MG tablet Take 1 tablet (40 mg total) by mouth at bedtime. 30 tablet 3  . tadalafil (CIALIS) 5 MG tablet Take 5 mg by mouth as needed for erectile dysfunction.    . vitamin E 400 UNIT capsule Take 400 Units by mouth daily.     No current facility-administered medications on file prior to visit.    Allergies  Allergen Reactions  . Sulfamethoxazole-Trimethoprim     REACTION: Rash    Family History  Problem Relation Age of Onset  . Diabetes Mother   . Prostate cancer Brother   . Diabetes Brother     x 3 brother  . Heart disease Brother     BP 136/94 mmHg  Pulse 119  Temp(Src) 97.9 F (36.6 C) (Oral)  Ht 6' (1.829 m)  Wt 192 lb (87.091 kg)  BMI 26.03 kg/m2  SpO2 98%  Review of Systems  Constitutional: Negative for fever.  HENT: Negative for nosebleeds.   Eyes: Negative for visual disturbance.  Respiratory: Negative for shortness of breath.  Cardiovascular: Negative for chest pain.  Gastrointestinal: Negative for blood in stool.  Endocrine: Negative for cold intolerance.  Genitourinary: Negative for hematuria.  Musculoskeletal: Negative for back pain.  Skin: Negative for rash.  Allergic/Immunologic: Negative for environmental allergies.  Neurological: Negative for numbness.  Hematological: Does not bruise/bleed easily.  Psychiatric/Behavioral: Negative for dysphoric mood.       Objective:   Physical Exam VS: see vs page GEN: no distress HEAD: head: no deformity eyes: no periorbital swelling, no proptosis external nose and ears are normal mouth: no lesion seen NECK: supple, thyroid is not enlarged CHEST WALL: no deformity LUNGS: clear to auscultation BREASTS:  No gynecomastia CV: reg rate and rhythm, no murmur ABD: abdomen is soft,  nontender.  no hepatosplenomegaly.  not distended.  no hernia RECTAL/PROSTATE: sees urology MUSCULOSKELETAL: muscle bulk and strength are grossly normal.  no obvious joint swelling.  gait is normal and steady EXTEMITIES: no deformity.  no ulcer on the feet.  feet are of normal color and temp.  no edema.  healing surgical scar at the right hand.  There is bilateral onychomycosis of the toenails PULSES: dorsalis pedis intact bilat.  no carotid bruit NEURO:  cn 2-12 grossly intact.   readily moves all 4's.  sensation is intact to touch on the feet SKIN:  Normal texture and temperature.  No rash or suspicious lesion is visible.   NODES:  None palpable at the neck PSYCH: alert, well-oriented.  Does not appear anxious nor depressed.       Assessment & Plan:  Wellness visit today, with problems stable, except as noted.   SEPARATE EVALUATION FOLLOWS--EACH PROBLEM HERE IS NEW, NOT RESPONDING TO TREATMENT, OR POSES SIGNIFICANT RISK TO THE PATIENT'S HEALTH: HISTORY OF THE PRESENT ILLNESS: Pt returns for f/u of diabetes mellitus: DM type: Insulin-requiring type 2 Dx'ed: 7902 Complications: nephropathy and retinopathy Therapy: insulin since soon after dx.   DKA: never Severe hypoglycemia: never Pancreatitis: never Other: he is on a simple qd insulin schedule, due to noncompliance. Interval history: He prefers the levemir pen.  He takes 30 qam, and 120 qpm.  no cbg record, but states cbg's are well-controlled.  He says he sometimes skips the insulin injections, when cbg is approx 100.   Pt states few mos of moderate hearing loss from the left ear, but no assoc pain. gerd sxs have recurred since he stopped aciphex, which ins did not pay for.   PAST MEDICAL HISTORY reviewed and up to date today.   REVIEW OF SYSTEMS: he denies hypoglycemia, dysphagia, and weight loss PHYSICAL EXAMINATION: VITAL SIGNS:  See vs page GENERAL: no distress Both eac's and tm's are normal LAB/XRAY  RESULTS: A1c=11.1% IMPRESSION: DM: glycemic control is worse Hearing loss, persistent Gerd, worse off rx PLAN:  Please increase the insulin to 100 units, twice a day.  Please take this no matter what your blood sugar is.   i have sent a prescription to your pharmacy, for the heartburn.  Please see a hearing specialist.  you will receive a phone call, about a day and time for an appointment.

## 2015-06-05 DIAGNOSIS — H9193 Unspecified hearing loss, bilateral: Secondary | ICD-10-CM | POA: Diagnosis not present

## 2015-06-09 DIAGNOSIS — G8929 Other chronic pain: Secondary | ICD-10-CM | POA: Diagnosis not present

## 2015-06-09 DIAGNOSIS — M545 Low back pain: Secondary | ICD-10-CM | POA: Diagnosis not present

## 2015-06-12 DIAGNOSIS — M720 Palmar fascial fibromatosis [Dupuytren]: Secondary | ICD-10-CM | POA: Diagnosis not present

## 2015-06-14 DIAGNOSIS — M720 Palmar fascial fibromatosis [Dupuytren]: Secondary | ICD-10-CM | POA: Diagnosis not present

## 2015-06-20 DIAGNOSIS — M720 Palmar fascial fibromatosis [Dupuytren]: Secondary | ICD-10-CM | POA: Diagnosis not present

## 2015-06-22 DIAGNOSIS — M720 Palmar fascial fibromatosis [Dupuytren]: Secondary | ICD-10-CM | POA: Diagnosis not present

## 2015-07-09 DIAGNOSIS — M720 Palmar fascial fibromatosis [Dupuytren]: Secondary | ICD-10-CM | POA: Diagnosis not present

## 2015-07-12 DIAGNOSIS — M720 Palmar fascial fibromatosis [Dupuytren]: Secondary | ICD-10-CM | POA: Diagnosis not present

## 2015-07-17 DIAGNOSIS — M720 Palmar fascial fibromatosis [Dupuytren]: Secondary | ICD-10-CM | POA: Diagnosis not present

## 2015-07-19 DIAGNOSIS — M720 Palmar fascial fibromatosis [Dupuytren]: Secondary | ICD-10-CM | POA: Diagnosis not present

## 2015-07-23 ENCOUNTER — Telehealth: Payer: Self-pay

## 2015-07-23 NOTE — Telephone Encounter (Signed)
Pt has a f/u appt on 08/29/2015 and will get HgA1c rechecked then

## 2015-07-24 DIAGNOSIS — M720 Palmar fascial fibromatosis [Dupuytren]: Secondary | ICD-10-CM | POA: Diagnosis not present

## 2015-07-25 ENCOUNTER — Telehealth: Payer: Self-pay

## 2015-07-25 NOTE — Telephone Encounter (Signed)
Pt will have B/P rechecked on upcoming f/u visit 08/29/2015

## 2015-07-31 DIAGNOSIS — M720 Palmar fascial fibromatosis [Dupuytren]: Secondary | ICD-10-CM | POA: Diagnosis not present

## 2015-08-13 ENCOUNTER — Telehealth: Payer: Self-pay | Admitting: Endocrinology

## 2015-08-13 MED ORDER — INSULIN DETEMIR 100 UNIT/ML FLEXPEN: 100.0000 [IU] | PEN_INJECTOR | Freq: Two times a day (BID) | SUBCUTANEOUS | Status: DC

## 2015-08-13 NOTE — Telephone Encounter (Signed)
Pt needs insulin rx with the increased dosage 100 u twice daily called into [facility] pharmacy

## 2015-08-13 NOTE — Telephone Encounter (Signed)
Rx sent per pt's request.  

## 2015-08-21 DIAGNOSIS — M720 Palmar fascial fibromatosis [Dupuytren]: Secondary | ICD-10-CM | POA: Diagnosis not present

## 2015-08-29 ENCOUNTER — Encounter: Payer: Self-pay | Admitting: Endocrinology

## 2015-08-29 ENCOUNTER — Other Ambulatory Visit: Payer: Self-pay

## 2015-08-29 ENCOUNTER — Ambulatory Visit (INDEPENDENT_AMBULATORY_CARE_PROVIDER_SITE_OTHER): Payer: 59 | Admitting: Endocrinology

## 2015-08-29 VITALS — BP 146/86 | HR 83 | Temp 97.8°F | Ht 72.0 in | Wt 202.0 lb

## 2015-08-29 DIAGNOSIS — E1121 Type 2 diabetes mellitus with diabetic nephropathy: Secondary | ICD-10-CM

## 2015-08-29 LAB — POCT GLYCOSYLATED HEMOGLOBIN (HGB A1C): Hemoglobin A1C: 9.4

## 2015-08-29 MED ORDER — GLUCOSE BLOOD VI STRP: 1.0000 | ORAL_STRIP | Freq: Two times a day (BID) | Status: DC

## 2015-08-29 MED ORDER — ONETOUCH ULTRASOFT LANCETS MISC: Status: DC

## 2015-08-29 MED ORDER — INSULIN DETEMIR 100 UNIT/ML FLEXPEN: PEN_INJECTOR | SUBCUTANEOUS | Status: DC

## 2015-08-29 MED ORDER — LOSARTAN POTASSIUM-HCTZ 50-12.5 MG PO TABS: 1.0000 | ORAL_TABLET | Freq: Every day | ORAL | Status: DC

## 2015-08-29 MED ORDER — LOSARTAN POTASSIUM 50 MG PO TABS: 50.0000 mg | ORAL_TABLET | Freq: Every day | ORAL | Status: DC

## 2015-08-29 NOTE — Patient Instructions (Addendum)
Please increase the insulin to 150 units each morning and 80 units each evening.  Please take this no matter what your blood sugar is.   check your blood sugar twice a day.  vary the time of day when you check, between before the 3 meals, and at bedtime.  also check if you have symptoms of your blood sugar being too high or too low.  please keep a record of the readings and bring it to your next appointment here.  You can write it on any piece of paper.  please call us sooner if your blood sugar goes below 70, or if you have a lot of readings over 200.  Please come back for a follow-up appointment in 2 months.   i have sent a prescription to your pharmacy, for the blood pressure.

## 2015-08-29 NOTE — Progress Notes (Signed)
Subjective:    Patient ID: [name], male    DOB: 1946-10-20, 69 y.o.   MRN: [id number]  HPI Pt returns for f/u of diabetes mellitus: DM type: Insulin-requiring type 2 Dx'ed: 3734 Complications: nephropathy and retinopathy Therapy: insulin since soon after dx.   DKA: never Severe hypoglycemia: never Pancreatitis: never Other: he is on a simple qd insulin schedule, due to noncompliance. Interval history: no cbg record, but states cbg's vary from 65-200's.  It is in general higher as the day goes on.  Pt says he never misses the insulin.  Past Medical History  Diagnosis Date  . ALLERGIC RHINITIS 08/03/2007  . BENIGN PROSTATIC HYPERTROPHY 08/03/2007  . Adenomatous colon polyp 06/2001  . 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 DIABETIC RETINOPATHY, BACKGROUND 09/29/2008  . DYSPHAGIA UNSPECIFIED 09/29/2008  . 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 GERD 11/28/2008  . HYPERLIPIDEMIA 08/03/2007  . 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 URINARY CALCULUS 09/29/2008  . Unspecified essential hypertension 01/07/2008  . UNSPECIFIED ANEMIA 12/29/2007  . PAIN IN SOFT TISSUES OF LIMB 01/07/2008    Past Surgical History  Procedure Laterality Date  . Shoulder surgery      left, cyst and tumor removed  . Lithotripsy      Social History   Social History  . Marital Status: Married    Spouse Name: N/A  . Number of Children: 2  . 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 Smoking status: Never Smoker   . Smokeless tobacco: Never Used  . Alcohol Use: Yes     Comment: occasional  . Drug Use: No  . Sexual Activity: Not on file   Other Topics Concern  . Not on file   Social History Narrative   Daily Caffeine Use 1-2 daily    Current Outpatient Prescriptions on File Prior to Visit  Medication Sig Dispense Refill  . aspirin 81 MG tablet Take 81 mg by mouth daily.    . Insulin Pen Needle 31G X 5 MM MISC Use as  directed 2 X daily. BD ULTRAPFINE MINI. Dx: 250.00 100 each 5  . Lactulose 20 GM/30ML SOLN Take 30 mLs (20 g total) by mouth 2 (two) times daily. 1892 mL 11  . meloxicam (MOBIC) 15 MG tablet Take 1 tablet (15 mg total) by mouth daily. 30 tablet 0  . NON FORMULARY Prostate Peantus  Once daily    . omeprazole (PRILOSEC) 40 MG capsule Take 1 capsule (40 mg total) by mouth daily. 30 capsule 3  . simvastatin (ZOCOR) 40 MG tablet Take 1 tablet (40 mg total) by mouth at bedtime. 30 tablet 3  . tadalafil (CIALIS) 5 MG tablet Take 5 mg by mouth as needed for erectile dysfunction.    . vitamin E 400 UNIT capsule Take 400 Units by mouth daily.     No current facility-administered medications on file prior to visit.    Allergies  Allergen Reactions  . Sulfamethoxazole-Trimethoprim     REACTION: Rash    Family History  Problem Relation Age of Onset  . Diabetes Mother   . Prostate cancer Brother   . Diabetes Brother     x 3 brother  . Heart disease Brother     BP 146/86 mmHg  Pulse 83  Temp(Src) 97.8 F (36.6 C) (Oral)  Ht 6' (1.829 m)  Wt 202 lb (91.627 kg)  BMI 27.39 kg/m2  SpO2  97%  Review of Systems Denies LOC    Objective:   Physical Exam VITAL SIGNS:  See vs page GENERAL: no distress Pulses: dorsalis pedis intact bilat.   MSK: no deformity of the feet CV: no leg edema Skin:  no ulcer on the feet.  normal color and temp on the feet. Neuro: sensation is intact to touch on the feet.     A1c=9.4%    Assessment & Plan:  DM: he needs increased rx. HTN: worse.  Patient is advised the following: Patient Instructions  Please increase the insulin to 150 units each morning and 80 units each evening.  Please take this no matter what your blood sugar is.   check your blood sugar twice a day.  vary the time of day when you check, between before the 3 meals, and at bedtime.  also check if you have symptoms of your blood sugar being too high or too low.  please keep a record of  the readings and bring it to your next appointment here.  You can write it on any piece of paper.  please call us sooner if your blood sugar goes below 70, or if you have a lot of readings over 200.  Please come back for a follow-up appointment in 2 months.   i have sent a prescription to your pharmacy, for the blood pressure.

## 2015-09-11 DIAGNOSIS — M720 Palmar fascial fibromatosis [Dupuytren]: Secondary | ICD-10-CM | POA: Diagnosis not present

## 2015-10-15 DIAGNOSIS — J45909 Unspecified asthma, uncomplicated: Secondary | ICD-10-CM | POA: Insufficient documentation

## 2015-10-23 DIAGNOSIS — M724 Pseudosarcomatous fibromatosis: Secondary | ICD-10-CM | POA: Insufficient documentation

## 2015-10-24 ENCOUNTER — Other Ambulatory Visit: Payer: Self-pay

## 2015-10-24 MED ORDER — SIMVASTATIN 40 MG PO TABS: 40.0000 mg | ORAL_TABLET | Freq: Every day | ORAL | Status: DC

## 2015-10-24 MED ORDER — OMEPRAZOLE 40 MG PO CPDR: 40.0000 mg | DELAYED_RELEASE_CAPSULE | Freq: Every day | ORAL | Status: DC

## 2015-10-26 ENCOUNTER — Other Ambulatory Visit: Payer: Self-pay

## 2015-10-26 MED ORDER — LACTULOSE 20 GM/30ML PO SOLN: 30.0000 mL | Freq: Two times a day (BID) | ORAL | Status: DC

## 2015-10-29 ENCOUNTER — Ambulatory Visit (INDEPENDENT_AMBULATORY_CARE_PROVIDER_SITE_OTHER): Payer: 59 | Admitting: Endocrinology

## 2015-10-29 ENCOUNTER — Encounter: Payer: Self-pay | Admitting: Endocrinology

## 2015-10-29 VITALS — BP 148/70 | HR 100 | Temp 98.0°F | Ht 73.0 in | Wt 205.0 lb

## 2015-10-29 DIAGNOSIS — E113299 Type 2 diabetes mellitus with mild nonproliferative diabetic retinopathy without macular edema, unspecified eye: Secondary | ICD-10-CM

## 2015-10-29 DIAGNOSIS — Z23 Encounter for immunization: Secondary | ICD-10-CM | POA: Diagnosis not present

## 2015-10-29 LAB — POCT GLYCOSYLATED HEMOGLOBIN (HGB A1C): Hemoglobin A1C: 8.9

## 2015-10-29 MED ORDER — LOSARTAN POTASSIUM-HCTZ 50-12.5 MG PO TABS: 1.0000 | ORAL_TABLET | Freq: Every day | ORAL | Status: DC

## 2015-10-29 MED ORDER — INSULIN NPH ISOPHANE & REGULAR (70-30) 100 UNIT/ML ~~LOC~~ SUSP
SUBCUTANEOUS | Status: DC
Start: 2015-10-29 — End: 2016-03-28

## 2015-10-29 NOTE — Progress Notes (Signed)
Subjective:    Patient ID: [name], male    DOB: 04/20/1946, 68 y.o.   MRN: [id number]  HPI Pt returns for f/u of diabetes mellitus: DM type: Insulin-requiring type 2 Dx'ed: Q000111Q Complications: nephropathy and retinopathy Therapy: insulin since soon after dx.   DKA: never Severe hypoglycemia: never Pancreatitis: never Other: he is on a simple qd insulin schedule, due to noncompliance.   Interval history: Pt says he can no longer afford the levemir.  no cbg record, but states cbg's vary from 65-300.  It is in general higher as the day goes on Past Medical History  Diagnosis Date  . ALLERGIC RHINITIS 08/03/2007  . BENIGN PROSTATIC HYPERTROPHY 08/03/2007  . Adenomatous colon polyp 06/2001  . 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 DIABETIC RETINOPATHY, BACKGROUND 09/29/2008  . DYSPHAGIA UNSPECIFIED 09/29/2008  . 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 GERD 11/28/2008  . HYPERLIPIDEMIA 08/03/2007  . 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 URINARY CALCULUS 09/29/2008  . Unspecified essential hypertension 01/07/2008  . UNSPECIFIED ANEMIA 12/29/2007  . PAIN IN SOFT TISSUES OF LIMB 01/07/2008    Past Surgical History  Procedure Laterality Date  . Shoulder surgery      left, cyst and tumor removed  . Lithotripsy      Social History   Social History  . Marital Status: Married    Spouse Name: N/A  . Number of Children: 2  . 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 Smoking status: Never Smoker   . Smokeless tobacco: Never Used  . Alcohol Use: Yes     Comment: occasional  . Drug Use: No  . Sexual Activity: Not on file   Other Topics Concern  . Not on file   Social History Narrative   Daily Caffeine Use 1-2 daily    Current Outpatient Prescriptions on File Prior to Visit  Medication Sig Dispense Refill  . aspirin 81 MG tablet Take 81 mg by mouth daily.    Marland Kitchen glucose blood (ONE TOUCH ULTRA TEST)  test strip 1 each by Other route 2 (two) times daily. And lancets 2/day 250.43 100 each 12  . Insulin Pen Needle 31G X 5 MM MISC Use as directed 2 X daily. BD ULTRAPFINE MINI. Dx: 250.00 100 each 5  . Lactulose 20 GM/30ML SOLN Take 30 mLs (20 g total) by mouth 2 (two) times daily. 1892 mL 11  . Lancets (ONETOUCH ULTRASOFT) lancets Use as instructed 2x daily Dx: 250.00 100 each 5  . meloxicam (MOBIC) 15 MG tablet Take 1 tablet (15 mg total) by mouth daily. 30 tablet 0  . NON FORMULARY Prostate Peantus  Once daily    . omeprazole (PRILOSEC) 40 MG capsule Take 1 capsule (40 mg total) by mouth daily. 30 capsule 3  . simvastatin (ZOCOR) 40 MG tablet Take 1 tablet (40 mg total) by mouth at bedtime. 30 tablet 3  . tadalafil (CIALIS) 5 MG tablet Take 5 mg by mouth as needed for erectile dysfunction.    . vitamin E 400 UNIT capsule Take 400 Units by mouth daily.     No current facility-administered medications on file prior to visit.    Allergies  Allergen Reactions  . Sulfamethoxazole-Trimethoprim     REACTION: Rash    Family History  Problem Relation Age of Onset  . Diabetes Mother   . Prostate cancer Brother   . Diabetes Brother  x 3 brother  . Heart disease Brother     BP 148/70 mmHg  Pulse 100  Temp(Src) 98 F (36.7 C) (Oral)  Ht 6\' 1"  (1.854 m)  Wt 205 lb (92.987 kg)  BMI 27.05 kg/m2  SpO2 97%  Review of Systems Denies LOC.  He has gained weight    Objective:   Physical Exam VITAL SIGNS:  See vs page GENERAL: no distress Pulses: dorsalis pedis intact bilat.   MSK: no deformity of the feet CV: no leg edema Skin:  no ulcer on the feet, but the skin is dry.  normal color and temp on the feet. Neuro: sensation is intact to touch on the feet   A1c=8.9%    Assessment & Plan:  HTN: he needs increased rx.  DM: he needs increased rx.  Weight gain: we discussed the need to re-lose.    Patient is advised the following: Patient Instructions  Please change the  levemir to [facility] 70/30, 150 units with breakfast and 60 units with supper.  Please take this no matter what your blood sugar is.   check your blood sugar twice a day.  vary the time of day when you check, between before the 3 meals, and at bedtime.  also check if you have symptoms of your blood sugar being too high or too low.  please keep a record of the readings and bring it to your next appointment here.  You can write it on any piece of paper.  please call us sooner if your blood sugar goes below 70, or if you have a lot of readings over 200.  Please come back for a follow-up appointment in 2 months.   Please call us next week, to tell us how the blood sugar is doing.   i have sent a prescription to your pharmacy, to increase the blood pressure pill.

## 2015-10-29 NOTE — Patient Instructions (Addendum)
Please change the levemir to [facility] 70/30, 150 units with breakfast and 60 units with supper.  Please take this no matter what your blood sugar is.   check your blood sugar twice a day.  vary the time of day when you check, between before the 3 meals, and at bedtime.  also check if you have symptoms of your blood sugar being too high or too low.  please keep a record of the readings and bring it to your next appointment here.  You can write it on any piece of paper.  please call us sooner if your blood sugar goes below 70, or if you have a lot of readings over 200.  Please come back for a follow-up appointment in 2 months.   Please call us next week, to tell us how the blood sugar is doing.   i have sent a prescription to your pharmacy, to increase the blood pressure pill.

## 2015-11-09 ENCOUNTER — Ambulatory Visit (INDEPENDENT_AMBULATORY_CARE_PROVIDER_SITE_OTHER): Payer: Medicare Other | Admitting: Family Medicine

## 2015-11-09 ENCOUNTER — Encounter: Payer: Self-pay | Admitting: Family Medicine

## 2015-11-09 VITALS — BP 142/70 | HR 108 | Temp 98.4°F | Resp 16 | Ht 72.0 in | Wt 204.0 lb

## 2015-11-09 DIAGNOSIS — J069 Acute upper respiratory infection, unspecified: Secondary | ICD-10-CM | POA: Diagnosis not present

## 2015-11-09 MED ORDER — HYDROCODONE-HOMATROPINE 5-1.5 MG/5ML PO SYRP: ORAL_SOLUTION | ORAL | Status: DC

## 2015-11-09 NOTE — Progress Notes (Signed)
Subjective:     Patient ID: [name], male   DOB: 09-Oct-1946, 69 y.o.   MRN: [id number]  HPI  Chief Complaint  Patient presents with  . URI    Patient reports that symptoms have worsened over the past week. Patient reports that he has sore throat, cough, nasal congestion, and headache. He also reports that he feels really fatigued. Patient has not been taking any OTC for symptoms.   Reports onset two days ago. Has felt feverish but did not take his temperature at home.   Review of Systems  Constitutional: Negative for chills.  Respiratory: Negative for shortness of breath.        Objective:   Physical Exam  Constitutional: He appears well-developed and well-nourished. No distress.  Ears: T.M's intact without inflammatio Throat: no tonsillar enlargement,moderate posterior pharyngeal erythema Neck: no cervical adenopathy Lungs: clear     Assessment:    1. Upper respiratory infection - HYDROcodone-homatropine (HYCODAN) 5-1.5 MG/5ML syrup; 5 ml 4-6 hours as needed for cough  Dispense: 240 mL; Refill: 0    Plan:    Discussed use of Mucinex D for congestion, Delsym for cough, and Benadryl for postnasal drainage

## 2015-11-09 NOTE — Patient Instructions (Signed)
Discussed use of Mucinex D for congestion, Delsym for cough, and Benadryl for postnasal drainage 

## 2015-11-27 ENCOUNTER — Other Ambulatory Visit: Payer: Self-pay

## 2015-11-27 MED ORDER — SIMVASTATIN 40 MG PO TABS: 40.0000 mg | ORAL_TABLET | Freq: Every day | ORAL | Status: DC

## 2015-11-28 ENCOUNTER — Other Ambulatory Visit: Payer: Self-pay

## 2015-11-28 ENCOUNTER — Other Ambulatory Visit: Payer: Self-pay | Admitting: *Deleted

## 2015-11-28 MED ORDER — SIMVASTATIN 40 MG PO TABS: 40.0000 mg | ORAL_TABLET | Freq: Every day | ORAL | Status: DC

## 2015-12-31 ENCOUNTER — Encounter: Payer: Self-pay | Admitting: Endocrinology

## 2015-12-31 ENCOUNTER — Ambulatory Visit (INDEPENDENT_AMBULATORY_CARE_PROVIDER_SITE_OTHER): Payer: PPO | Admitting: Endocrinology

## 2015-12-31 VITALS — BP 164/64 | HR 103 | Temp 98.2°F | Ht 72.0 in | Wt 220.0 lb

## 2015-12-31 DIAGNOSIS — E1121 Type 2 diabetes mellitus with diabetic nephropathy: Secondary | ICD-10-CM | POA: Diagnosis not present

## 2015-12-31 LAB — POCT GLYCOSYLATED HEMOGLOBIN (HGB A1C): Hemoglobin A1C: 7.4

## 2015-12-31 MED ORDER — METOPROLOL SUCCINATE ER 25 MG PO TB24: 25.0000 mg | ORAL_TABLET | Freq: Every day | ORAL | Status: DC

## 2015-12-31 NOTE — Progress Notes (Signed)
Subjective:    Patient ID: [name], male    DOB: 11-Mar-1946, 70 y.o.   MRN: [id number]  HPI Pt returns for f/u of diabetes mellitus: DM type: Insulin-requiring type 2 Dx'ed: Q000111Q Complications: nephropathy and retinopathy Therapy: insulin since soon after dx.   DKA: never Severe hypoglycemia: never Pancreatitis: never Other: he is on a BID insulin schedule, due to noncompliance; he takes human insulin, due to cost.   Interval history: no cbg record, but states cbg's are seldom low (70).  This happens in the middle of the night.  It is highest at hs.  It is higher in the afternoon than in am.  He takes less than the prescribed insulin at breakfast, and more at supper.  Past Medical History  Diagnosis Date  . ALLERGIC RHINITIS 08/03/2007  . BENIGN PROSTATIC HYPERTROPHY 08/03/2007  . Adenomatous colon polyp 06/2001  . 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 DIABETIC RETINOPATHY, BACKGROUND 09/29/2008  . DYSPHAGIA UNSPECIFIED 09/29/2008  . 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 GERD 11/28/2008  . HYPERLIPIDEMIA 08/03/2007  . 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 URINARY CALCULUS 09/29/2008  . Unspecified essential hypertension 01/07/2008  . UNSPECIFIED ANEMIA 12/29/2007  . PAIN IN SOFT TISSUES OF LIMB 01/07/2008    Past Surgical History  Procedure Laterality Date  . Shoulder surgery      left, cyst and tumor removed  . Lithotripsy      Social History   Social History  . Marital Status: Married    Spouse Name: N/A  . Number of Children: 2  . 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 Smoking status: Never Smoker   . Smokeless tobacco: Never Used  . Alcohol Use: Yes     Comment: occasional  . Drug Use: No  . Sexual Activity: Not on file   Other Topics Concern  . Not on file   Social History Narrative   Daily Caffeine Use 1-2 daily    Current Outpatient Prescriptions on File Prior to Visit    Medication Sig Dispense Refill  . aspirin 81 MG tablet Take 81 mg by mouth daily.    Marland Kitchen glucose blood (ONE TOUCH ULTRA TEST) test strip 1 each by Other route 2 (two) times daily. And lancets 2/day 250.43 100 each 12  . insulin NPH-regular Human (NOVOLIN 70/30) (70-30) 100 UNIT/ML injection 150 units with breakfast and 60 units with supper, and syringes 3/day 70 mL 11  . Insulin Pen Needle 31G X 5 MM MISC Use as directed 2 X daily. BD ULTRAPFINE MINI. Dx: 250.00 100 each 5  . Lactulose 20 GM/30ML SOLN Take 30 mLs (20 g total) by mouth 2 (two) times daily. 1892 mL 11  . Lancets (ONETOUCH ULTRASOFT) lancets Use as instructed 2x daily Dx: 250.00 100 each 5  . losartan-hydrochlorothiazide (HYZAAR) 50-12.5 MG tablet Take 1 tablet by mouth daily. 90 tablet 3  . meloxicam (MOBIC) 15 MG tablet Take 1 tablet (15 mg total) by mouth daily. 30 tablet 0  . NON FORMULARY Reported on 11/09/2015    . omeprazole (PRILOSEC) 40 MG capsule Take 1 capsule (40 mg total) by mouth daily. 30 capsule 3  . simvastatin (ZOCOR) 40 MG tablet Take 1 tablet (40 mg total) by mouth at bedtime. 90 tablet 0  . vitamin E 400 UNIT capsule Take 400 Units by mouth daily.    . tadalafil (CIALIS) 5 MG  tablet Take 5 mg by mouth as needed for erectile dysfunction. Reported on 12/31/2015     No current facility-administered medications on file prior to visit.    Allergies  Allergen Reactions  . Sulfamethoxazole-Trimethoprim     REACTION: Rash    Family History  Problem Relation Age of Onset  . Diabetes Mother   . Prostate cancer Brother   . Diabetes Brother     x 3 brother  . Heart disease Brother     BP 164/64 mmHg  Pulse 103  Temp(Src) 98.2 F (36.8 C) (Oral)  Ht 6' (1.829 m)  Wt 220 lb (99.791 kg)  BMI 29.83 kg/m2  SpO2 96%  Review of Systems Denies LOC.     Objective:   Physical Exam VITAL SIGNS:  See vs page GENERAL: no distress Pulses: dorsalis pedis intact bilat.   MSK: no deformity of the feet CV: no leg  edema Skin:  no ulcer on the feet.  normal color and temp on the feet. Neuro: sensation is intact to touch on the feet   A1c=7.4%    Assessment & Plan:  DM: this is the best control this pt should aim for, given this regimen, which does match insulin to her changing needs throughout the day.  The pattern of his cbg's indicates he needs some adjustment in his therapy. HTN: worse.  Patient is advised the following: Patient Instructions  Please take the insulin numbers listed below. check your blood sugar twice a day.  vary the time of day when you check, between before the 3 meals, and at bedtime.  also check if you have symptoms of your blood sugar being too high or too low.  please keep a record of the readings and bring it to your next appointment here.  You can write it on any piece of paper.  please call us sooner if your blood sugar goes below 70, or if you have a lot of readings over 200.  Please come back for a follow-up appointment in 3 months.  i have sent a prescription to your pharmacy, to add another blood pressure pill.

## 2015-12-31 NOTE — Patient Instructions (Addendum)
Please take the insulin numbers listed below. check your blood sugar twice a day.  vary the time of day when you check, between before the 3 meals, and at bedtime.  also check if you have symptoms of your blood sugar being too high or too low.  please keep a record of the readings and bring it to your next appointment here.  You can write it on any piece of paper.  please call us sooner if your blood sugar goes below 70, or if you have a lot of readings over 200.  Please come back for a follow-up appointment in 3 months.  i have sent a prescription to your pharmacy, to add another blood pressure pill.

## 2016-03-28 ENCOUNTER — Encounter: Payer: Self-pay | Admitting: Endocrinology

## 2016-03-28 ENCOUNTER — Ambulatory Visit (INDEPENDENT_AMBULATORY_CARE_PROVIDER_SITE_OTHER): Payer: PPO | Admitting: Endocrinology

## 2016-03-28 VITALS — BP 164/88 | HR 95 | Temp 97.9°F | Resp 18 | Ht 72.0 in | Wt 224.0 lb

## 2016-03-28 DIAGNOSIS — E1121 Type 2 diabetes mellitus with diabetic nephropathy: Secondary | ICD-10-CM

## 2016-03-28 DIAGNOSIS — E11319 Type 2 diabetes mellitus with unspecified diabetic retinopathy without macular edema: Secondary | ICD-10-CM

## 2016-03-28 DIAGNOSIS — Z794 Long term (current) use of insulin: Secondary | ICD-10-CM | POA: Diagnosis not present

## 2016-03-28 LAB — POCT GLYCOSYLATED HEMOGLOBIN (HGB A1C): HEMOGLOBIN A1C: 7.6

## 2016-03-28 MED ORDER — INSULIN NPH ISOPHANE & REGULAR (70-30) 100 UNIT/ML ~~LOC~~ SUSP: SUBCUTANEOUS | Status: DC

## 2016-03-28 MED ORDER — LOSARTAN POTASSIUM-HCTZ 100-12.5 MG PO TABS: 1.0000 | ORAL_TABLET | Freq: Every day | ORAL | Status: DC

## 2016-03-28 NOTE — Patient Instructions (Addendum)
Please change the insulin to 140 units with breakfast and 70 units with supper. On this type of insulin schedule, you should eat meals on a regular schedule.  If a meal is missed or significantly delayed, your blood sugar could go low.  check your blood sugar twice a day.  vary the time of day when you check, between before the 3 meals, and at bedtime.  also check if you have symptoms of your blood sugar being too high or too low.  please keep a record of the readings and bring it to your next appointment here.  You can write it on any piece of paper.  please call us sooner if your blood sugar goes below 70, or if you have a lot of readings over 200.  Please come back for a regular physical appointment in 3 months.  i have sent a prescription to your pharmacy, to increase the blood pressure pill.   Carefully watch the bruised toenail, and call if you get drainage, swelling, or redness there.  The nail will probably fall off.

## 2016-03-28 NOTE — Progress Notes (Signed)
Pre visit review using our clinic review tool, if applicable. No additional management support is needed unless otherwise documented below in the visit note. 

## 2016-03-28 NOTE — Progress Notes (Signed)
Subjective:    Patient ID: [name], male    DOB: July 29, 1946, 70 y.o.   MRN: [id number]  HPI Pt returns for f/u of diabetes mellitus: DM type: Insulin-requiring type 2 Dx'ed: Q000111Q Complications: nephropathy and retinopathy Therapy: insulin since soon after dx.   DKA: never Severe hypoglycemia: never Pancreatitis: never Other: he is on a BID insulin schedule, due to noncompliance; he takes human insulin, due to cost.  He is retired.  Interval history: no cbg record, but states cbg's are occasionally mildly low in the afternoon. This happens when lunch is missed or delayed.  He seldom misses the insulin Past Medical History  Diagnosis Date  . ALLERGIC RHINITIS 08/03/2007  . BENIGN PROSTATIC HYPERTROPHY 08/03/2007  . Adenomatous colon polyp 06/2001  . 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 DIABETIC RETINOPATHY, BACKGROUND 09/29/2008  . DYSPHAGIA UNSPECIFIED 09/29/2008  . 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 GERD 11/28/2008  . HYPERLIPIDEMIA 08/03/2007  . 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 URINARY CALCULUS 09/29/2008  . Unspecified essential hypertension 01/07/2008  . UNSPECIFIED ANEMIA 12/29/2007  . PAIN IN SOFT TISSUES OF LIMB 01/07/2008    Past Surgical History  Procedure Laterality Date  . Shoulder surgery      left, cyst and tumor removed  . Lithotripsy      Social History   Social History  . Marital Status: Married    Spouse Name: N/A  . Number of Children: 2  . 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 Smoking status: Never Smoker   . Smokeless tobacco: Never Used  . Alcohol Use: Yes     Comment: occasional  . Drug Use: No  . Sexual Activity: Not on file   Other Topics Concern  . Not on file   Social History Narrative   Daily Caffeine Use 1-2 daily    Current Outpatient Prescriptions on File Prior to Visit  Medication Sig Dispense Refill  . aspirin 81 MG tablet Take 81 mg by  mouth daily.    Marland Kitchen glucose blood (ONE TOUCH ULTRA TEST) test strip 1 each by Other route 2 (two) times daily. And lancets 2/day 250.43 100 each 12  . Insulin Pen Needle 31G X 5 MM MISC Use as directed 2 X daily. BD ULTRAPFINE MINI. Dx: 250.00 100 each 5  . Lactulose 20 GM/30ML SOLN Take 30 mLs (20 g total) by mouth 2 (two) times daily. 1892 mL 11  . Lancets (ONETOUCH ULTRASOFT) lancets Use as instructed 2x daily Dx: 250.00 100 each 5  . meloxicam (MOBIC) 15 MG tablet Take 1 tablet (15 mg total) by mouth daily. 30 tablet 0  . metoprolol succinate (TOPROL-XL) 25 MG 24 hr tablet Take 1 tablet (25 mg total) by mouth daily. 90 tablet 3  . NON FORMULARY Reported on 11/09/2015    . omeprazole (PRILOSEC) 40 MG capsule Take 1 capsule (40 mg total) by mouth daily. 30 capsule 3  . simvastatin (ZOCOR) 40 MG tablet Take 1 tablet (40 mg total) by mouth at bedtime. 90 tablet 0  . vitamin E 400 UNIT capsule Take 400 Units by mouth daily.     No current facility-administered medications on file prior to visit.    Allergies  Allergen Reactions  . Sulfamethoxazole-Trimethoprim     REACTION: Rash    Family History  Problem Relation Age of Onset  . Diabetes Mother   . Prostate cancer  Brother   . Diabetes Brother     x 3 brother  . Heart disease Brother     BP 164/88 mmHg  Pulse 95  Temp(Src) 97.9 F (36.6 C) (Oral)  Resp 18  Ht 6' (1.829 m)  Wt 224 lb (101.606 kg)  BMI 30.37 kg/m2  SpO2 93%     Review of Systems He denies LOC.  He has weight gain.      Objective:   Physical Exam VITAL SIGNS:  See vs page GENERAL: no distress Pulses: dorsalis pedis intact bilat.   MSK: no deformity of the feet.  CV: no leg edema. Skin:  no ulcer on the feet.  normal color and temp on the feet. Neuro: sensation is intact to touch on the feet Ext: there is a contusion of the left great toenail.      A1c=7.6%    Assessment & Plan:  DM: The pattern of his cbg's indicates he needs some adjustment in  his therapy Toenail contusion, new  Patient is advised the following: Patient Instructions  Please change the insulin to 140 units with breakfast and 70 units with supper. On this type of insulin schedule, you should eat meals on a regular schedule.  If a meal is missed or significantly delayed, your blood sugar could go low.  check your blood sugar twice a day.  vary the time of day when you check, between before the 3 meals, and at bedtime.  also check if you have symptoms of your blood sugar being too high or too low.  please keep a record of the readings and bring it to your next appointment here.  You can write it on any piece of paper.  please call us sooner if your blood sugar goes below 70, or if you have a lot of readings over 200.  Please come back for a regular physical appointment in 3 months.  i have sent a prescription to your pharmacy, to increase the blood pressure pill.   Carefully watch the bruised toenail, and call if you get drainage, swelling, or redness there.  The nail will probably fall off.

## 2016-03-29 DIAGNOSIS — E119 Type 2 diabetes mellitus without complications: Secondary | ICD-10-CM | POA: Insufficient documentation

## 2016-04-01 ENCOUNTER — Telehealth: Payer: Self-pay | Admitting: Endocrinology

## 2016-04-01 MED ORDER — LOSARTAN POTASSIUM-HCTZ 100-12.5 MG PO TABS: 1.0000 | ORAL_TABLET | Freq: Every day | ORAL | Status: DC

## 2016-04-01 NOTE — Telephone Encounter (Signed)
I contacted the pt and advised I could not locate where the rx has been sent. Pt advised I submitted the rx today.

## 2016-04-01 NOTE — Telephone Encounter (Signed)
Pt wanted to make sure that his BP medication was sent into [facility] last Friday

## 2016-04-15 ENCOUNTER — Other Ambulatory Visit: Payer: Self-pay | Admitting: Endocrinology

## 2016-05-15 DIAGNOSIS — M25473 Effusion, unspecified ankle: Secondary | ICD-10-CM | POA: Diagnosis not present

## 2016-05-15 DIAGNOSIS — R05 Cough: Secondary | ICD-10-CM | POA: Diagnosis not present

## 2016-06-16 ENCOUNTER — Other Ambulatory Visit: Payer: Self-pay | Admitting: Endocrinology

## 2016-07-09 ENCOUNTER — Encounter: Payer: Self-pay | Admitting: Endocrinology

## 2016-07-09 ENCOUNTER — Ambulatory Visit (INDEPENDENT_AMBULATORY_CARE_PROVIDER_SITE_OTHER): Payer: PPO | Admitting: Endocrinology

## 2016-07-09 ENCOUNTER — Ambulatory Visit
Admission: RE | Admit: 2016-07-09 | Discharge: 2016-07-09 | Disposition: A | Discharge status: Home / Self Care | Payer: PPO | Source: Ambulatory Visit | Attending: Endocrinology | Admitting: Endocrinology

## 2016-07-09 VITALS — BP 140/70 | HR 70 | Temp 97.9°F | Ht 72.0 in | Wt 224.6 lb

## 2016-07-09 DIAGNOSIS — N209 Urinary calculus, unspecified: Secondary | ICD-10-CM | POA: Diagnosis not present

## 2016-07-09 DIAGNOSIS — D649 Anemia, unspecified: Secondary | ICD-10-CM

## 2016-07-09 DIAGNOSIS — R0600 Dyspnea, unspecified: Secondary | ICD-10-CM

## 2016-07-09 DIAGNOSIS — R0609 Other forms of dyspnea: Secondary | ICD-10-CM

## 2016-07-09 DIAGNOSIS — Z0000 Encounter for general adult medical examination without abnormal findings: Secondary | ICD-10-CM | POA: Diagnosis not present

## 2016-07-09 DIAGNOSIS — E785 Hyperlipidemia, unspecified: Secondary | ICD-10-CM

## 2016-07-09 DIAGNOSIS — E11319 Type 2 diabetes mellitus with unspecified diabetic retinopathy without macular edema: Secondary | ICD-10-CM | POA: Diagnosis not present

## 2016-07-09 DIAGNOSIS — R0602 Shortness of breath: Secondary | ICD-10-CM | POA: Diagnosis not present

## 2016-07-09 DIAGNOSIS — R05 Cough: Secondary | ICD-10-CM | POA: Diagnosis not present

## 2016-07-09 DIAGNOSIS — Z794 Long term (current) use of insulin: Secondary | ICD-10-CM

## 2016-07-09 DIAGNOSIS — Z125 Encounter for screening for malignant neoplasm of prostate: Secondary | ICD-10-CM

## 2016-07-09 LAB — HEPATIC FUNCTION PANEL
ALK PHOS: 63 U/L (ref 39–117)
ALT: 23 U/L (ref 0–53)
AST: 20 U/L (ref 0–37)
Albumin: 4.1 g/dL (ref 3.5–5.2)
BILIRUBIN DIRECT: 0.1 mg/dL (ref 0.0–0.3)
Total Bilirubin: 0.4 mg/dL (ref 0.2–1.2)
Total Protein: 7.2 g/dL (ref 6.0–8.3)

## 2016-07-09 LAB — CBC WITH DIFFERENTIAL/PLATELET
BASOS ABS: 0 10*3/uL (ref 0.0–0.1)
Basophils Relative: 0.6 % (ref 0.0–3.0)
EOS PCT: 7 % — AB (ref 0.0–5.0)
Eosinophils Absolute: 0.5 10*3/uL (ref 0.0–0.7)
HEMATOCRIT: 47.5 % (ref 39.0–52.0)
Hemoglobin: 16.3 g/dL (ref 13.0–17.0)
LYMPHS PCT: 33.1 % (ref 12.0–46.0)
Lymphs Abs: 2.5 10*3/uL (ref 0.7–4.0)
MCHC: 34.3 g/dL (ref 30.0–36.0)
MCV: 91.8 fl (ref 78.0–100.0)
MONOS PCT: 8.8 % (ref 3.0–12.0)
Monocytes Absolute: 0.7 10*3/uL (ref 0.1–1.0)
Neutro Abs: 3.8 10*3/uL (ref 1.4–7.7)
Neutrophils Relative %: 50.5 % (ref 43.0–77.0)
Platelets: 238 10*3/uL (ref 150.0–400.0)
RBC: 5.18 Mil/uL (ref 4.22–5.81)
RDW: 15 % (ref 11.5–15.5)
WBC: 7.6 10*3/uL (ref 4.0–10.5)

## 2016-07-09 LAB — URINALYSIS, ROUTINE W REFLEX MICROSCOPIC
Bilirubin Urine: NEGATIVE
HGB URINE DIPSTICK: NEGATIVE
Ketones, ur: NEGATIVE
LEUKOCYTES UA: NEGATIVE
NITRITE: NEGATIVE
RBC / HPF: NONE SEEN (ref 0–?)
Specific Gravity, Urine: 1.02 (ref 1.000–1.030)
Urine Glucose: 250 — AB
Urobilinogen, UA: 0.2 (ref 0.0–1.0)
pH: 5 (ref 5.0–8.0)

## 2016-07-09 LAB — MICROALBUMIN / CREATININE URINE RATIO
CREATININE, U: 120.7 mg/dL
Microalb Creat Ratio: 5.6 mg/g (ref 0.0–30.0)
Microalb, Ur: 6.7 mg/dL — ABNORMAL HIGH (ref 0.0–1.9)

## 2016-07-09 LAB — BASIC METABOLIC PANEL
BUN: 16 mg/dL (ref 6–23)
CHLORIDE: 101 meq/L (ref 96–112)
CO2: 28 mEq/L (ref 19–32)
Calcium: 9.9 mg/dL (ref 8.4–10.5)
Creatinine, Ser: 1.33 mg/dL (ref 0.40–1.50)
GFR: 56.81 mL/min — ABNORMAL LOW (ref >60.00)
GLUCOSE: 213 mg/dL — AB (ref 70–99)
POTASSIUM: 4.3 meq/L (ref 3.5–5.1)
Sodium: 136 mEq/L (ref 135–145)

## 2016-07-09 LAB — TSH: TSH: 1.27 u[IU]/mL (ref 0.35–4.50)

## 2016-07-09 LAB — LIPID PANEL
CHOLESTEROL: 154 mg/dL (ref 0–200)
HDL: 35.4 mg/dL — AB (ref >39.00)
NonHDL: 118.23
TRIGLYCERIDES: 351 mg/dL — AB (ref 0.0–149.0)
Total CHOL/HDL Ratio: 4
VLDL: 70.2 mg/dL — AB (ref 0.0–40.0)

## 2016-07-09 LAB — POCT GLYCOSYLATED HEMOGLOBIN (HGB A1C): HEMOGLOBIN A1C: 7.6

## 2016-07-09 LAB — IBC PANEL
Iron: 85 ug/dL (ref 42–165)
SATURATION RATIOS: 21.1 % (ref 20.0–50.0)
TRANSFERRIN: 288 mg/dL (ref 212.0–360.0)

## 2016-07-09 LAB — PSA: PSA: 2.87 ng/mL (ref 0.10–4.00)

## 2016-07-09 LAB — LDL CHOLESTEROL, DIRECT: Direct LDL: 88 mg/dL

## 2016-07-09 IMAGING — CR DG CHEST 2V
2 series · 2 of 2 positions shown · non-contrast
Comparison: [DATE]

CLINICAL DATA: Increasing shortness of breath and cough.

EXAM:
CHEST  2 VIEW

[w chest lat]
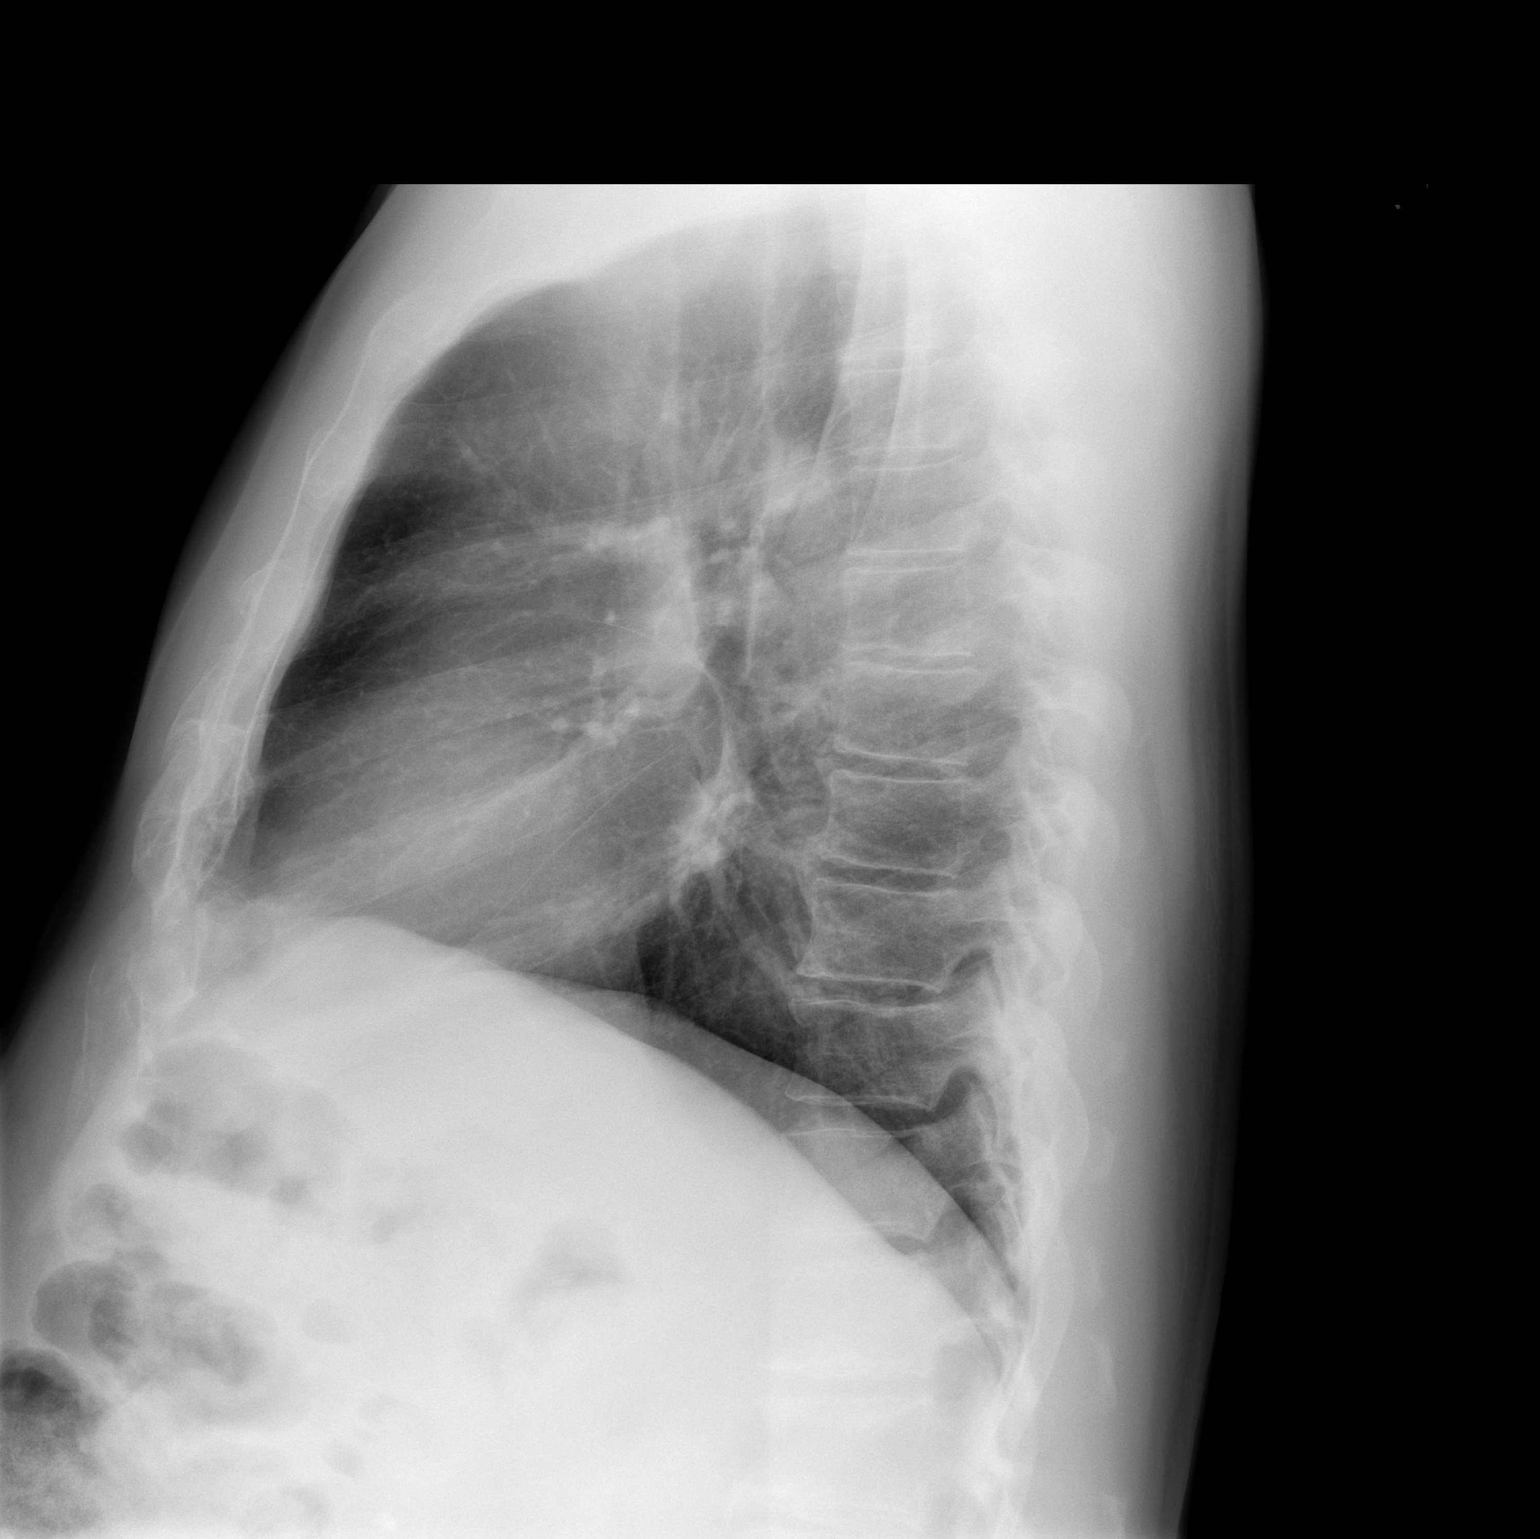

[w chest pa]
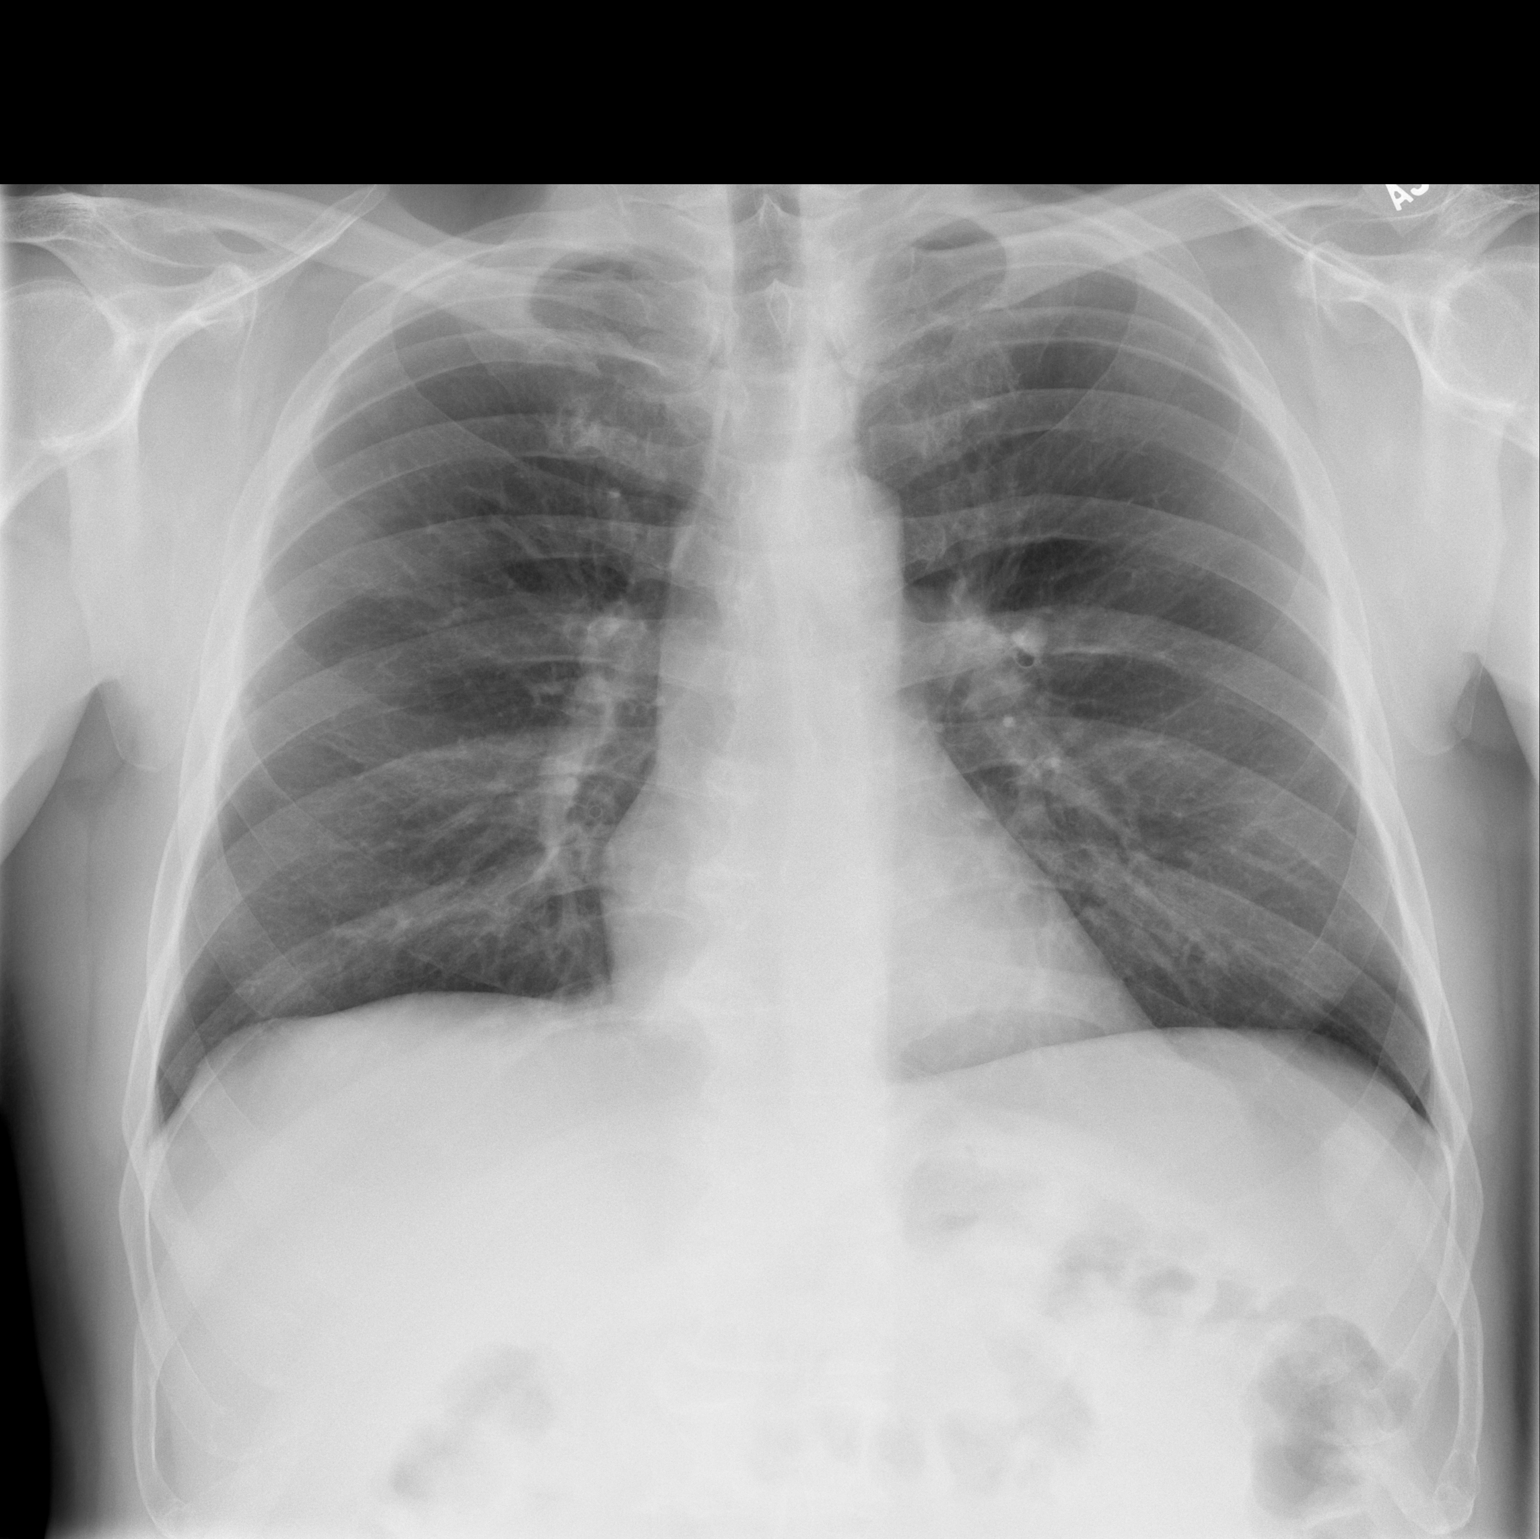

[2 of 2 positions shown; findings below may reference images not displayed]

FINDINGS: Heart and mediastinal contours are within normal limits. No focal
opacities or effusions. No acute bony abnormality.
IMPRESSION: No active cardiopulmonary disease.

## 2016-07-09 MED ORDER — INSULIN NPH ISOPHANE & REGULAR (70-30) 100 UNIT/ML ~~LOC~~ SUSP: SUBCUTANEOUS | 11 refills | Status: DC

## 2016-07-09 NOTE — Patient Instructions (Addendum)
Please change the insulin to 140 units with breakfast and 50 units with supper, no matter what your blood sugar is.   On this type of insulin schedule, you should eat meals on a regular schedule.  If a meal is missed or significantly delayed, your blood sugar could go low.  check your blood sugar twice a day.  vary the time of day when you check, between before the 3 meals, and at bedtime.  also check if you have symptoms of your blood sugar being too high or too low.  please keep a record of the readings and bring it to your next appointment here.  You can write it on any piece of paper.  please call us sooner if your blood sugar goes below 70, or if you have a lot of readings over 200.  blood tests are requested for you today.  We'll let you know about the results. Please consider these measures for your health:  minimize alcohol.  Do not use tobacco products.  Have a colonoscopy at least every 10 years from age 80 Keep firearms safely stored.  Always use seat belts.  have working smoke alarms in your home.  See an eye doctor and dentist regularly.  Never drive under the influence of alcohol or drugs (including prescription drugs).  Those with fair skin should take precautions against the sun, and should carefully examine their skin once per month, for any new or changed moles. It is critically important to prevent falling down (keep floor areas well-lit, dry, and free of loose objects.  If you have a cane, walker, or wheelchair, you should use it, even for short trips around the house.  Wear flat-soled shoes.  Also, try not to rush).   Please come back for a follow-up appointment in 3 months.

## 2016-07-09 NOTE — Progress Notes (Signed)
Subjective:    Patient ID: [name], male    DOB: 08/30/1946, 70 y.o.   MRN: [id number]  HPI Pt is here for regular wellness examination, and is feeling pretty well in general, and says chronic med probs are stable, except as noted below Past Medical History:  Diagnosis Date  . Adenomatous colon polyp 06/2001  . ALLERGIC RHINITIS 08/03/2007  . BENIGN PROSTATIC HYPERTROPHY 08/03/2007  . 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 DIABETIC RETINOPATHY, BACKGROUND 09/29/2008  . DYSPHAGIA UNSPECIFIED 09/29/2008  . 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 GERD 11/28/2008  . HYPERLIPIDEMIA 08/03/2007  . 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 PAIN IN SOFT TISSUES OF LIMB 01/07/2008  . UNSPECIFIED ANEMIA 12/29/2007  . Unspecified essential hypertension 01/07/2008  . URINARY CALCULUS 09/29/2008    Past Surgical History:  Procedure Laterality Date  . LITHOTRIPSY    . SHOULDER SURGERY     left, cyst and tumor removed    Social History   Social History  . Marital status: Married    Spouse name: N/A  . Number of children: 2  . Years of education: N/A   Occupational History  . [employer]   Social History Main Topics  . Smoking status: Never Smoker  . Smokeless tobacco: Never Used  . Alcohol use Yes     Comment: occasional  . Drug use: No  . Sexual activity: Not on file   Other Topics Concern  . Not on file   Social History Narrative   Daily Caffeine Use 1-2 daily    Current Outpatient Prescriptions on File Prior to Visit  Medication Sig Dispense Refill  . aspirin 81 MG tablet Take 81 mg by mouth daily.    Marland Kitchen glucose blood (ONE TOUCH ULTRA TEST) test strip 1 each by Other route 2 (two) times daily. And lancets 2/day 250.43 100 each 12  . Insulin Pen Needle 31G X 5 MM MISC Use as directed 2 X daily. BD ULTRAPFINE MINI. Dx: 250.00 100 each 5  . Lactulose 20 GM/30ML SOLN Take 30 mLs (20 g total) by mouth 2 (two) times daily. 1892 mL 11  . Lancets (ONETOUCH  ULTRASOFT) lancets Use as instructed 2x daily Dx: 250.00 100 each 5  . losartan-hydrochlorothiazide (HYZAAR) 100-12.5 MG tablet Take 1 tablet by mouth daily. 30 tablet 11  . meloxicam (MOBIC) 15 MG tablet Take 1 tablet (15 mg total) by mouth daily. 30 tablet 0  . metoprolol succinate (TOPROL-XL) 25 MG 24 hr tablet Take 1 tablet (25 mg total) by mouth daily. 90 tablet 3  . NON FORMULARY Reported on 11/09/2015    . omeprazole (PRILOSEC) 40 MG capsule TAKE ONE (1) CAPSULE EACH DAY 30 capsule 11  . simvastatin (ZOCOR) 40 MG tablet Take 1 tablet (40 mg total) by mouth at bedtime. 90 tablet 0  . vitamin E 400 UNIT capsule Take 400 Units by mouth daily.     No current facility-administered medications on file prior to visit.     Allergies  Allergen Reactions  . Sulfamethoxazole-Trimethoprim     REACTION: Rash    Family History  Problem Relation Age of Onset  . Diabetes Mother   . Prostate cancer Brother   . Diabetes Brother     x 3 brother  . Heart disease Brother     BP 140/70 (BP Location: Left Arm, Patient Position: Sitting, Cuff Size: Normal)   Pulse 70   Temp 97.9 F (36.6 C) (Oral)  Ht 6' (1.829 m)   Wt 224 lb 9.6 oz (101.9 kg)   SpO2 97%   BMI 30.46 kg/m     Review of Systems  Constitutional: Negative for fever.  HENT: Negative for hearing loss.   Eyes: Negative for visual disturbance.  Respiratory: Negative for wheezing.   Cardiovascular: Negative for leg swelling.  Gastrointestinal: Negative for blood in stool.  Endocrine: Negative for cold intolerance.  Genitourinary: Negative for difficulty urinating and hematuria.  Musculoskeletal: Negative for gait problem.  Skin: Negative for rash.  Allergic/Immunologic: Positive for environmental allergies.  Neurological: Negative for numbness.  Hematological: Does not bruise/bleed easily.  Psychiatric/Behavioral: Negative for dysphoric mood.       Objective:   Physical Exam VS: see vs page GEN: no distress HEAD:  head: no deformity eyes: no periorbital swelling, no proptosis external nose and ears are normal mouth: no lesion seen NECK: supple, thyroid is not enlarged CHEST WALL: no deformity LUNGS: clear to auscultation BREASTS:  No gynecomastia CV: reg rate and rhythm, no murmur ABD: abdomen is soft, nontender.  no hepatosplenomegaly.  not distended.  no hernia GENITALIA/RECTAL/PROSTATE: declined MUSCULOSKELETAL: muscle bulk and strength are grossly normal.  no obvious joint swelling.  gait is normal and steady EXTEMITIES: no deformity.  no ulcer on the feet.  feet are of normal color and temp.  no edema.  There is bilateral onychomycosis of the toenails PULSES: dorsalis pedis intact bilat.  no carotid bruit NEURO:  cn 2-12 grossly intact.   readily moves all 4's.  sensation is intact to touch on the feet SKIN:  Normal texture and temperature.  No rash or suspicious lesion is visible.   NODES:  None palpable at the neck PSYCH: alert, well-oriented.  Does not appear anxious nor depressed.      Assessment & Plan:  Wellness visit today, with problems stable, except as noted.   SEPARATE EVALUATION FOLLOWS--EACH PROBLEM HERE IS NEW, NOT RESPONDING TO TREATMENT, OR POSES SIGNIFICANT RISK TO THE PATIENT'S HEALTH: HISTORY OF THE PRESENT ILLNESS: Pt returns for f/u of diabetes mellitus: DM type: Insulin-requiring type 2 Dx'ed: [id number] Complications: nephropathy and retinopathy Therapy: insulin since soon after dx.   DKA: never Severe hypoglycemia: never Pancreatitis: never Other: he is on a BID insulin schedule, due to noncompliance; he takes human insulin, due to cost.  He is retired.  Interval history: He seldom has hypoglycemia, and these episodes are mild.  This happens in the fasting state.  He takes 130 qam and 60 units qpm, according to cbg's Pt states few mos of slight doe, and assoc diaphoresis.  PAST MEDICAL HISTORY reviewed and up to date today.  REVIEW OF SYSTEMS: He has gained  weight.  Denies chest pain PHYSICAL EXAMINATION: VITAL SIGNS:  See vs page GENERAL: no distress LUNGS:  Clear to auscultation LAB/XRAY RESULTS: i personally reviewed electrocardiogram tracing (today): Indication: dyslipidemia Impression: normal i personally reviewed spirometry tracing (today): Indication: sob Impression: restriction Lab Results  Component Value Date   HGBA1C 7.6 07/09/2016  IMPRESSION:  SOB, new, uncertain etiology. Insulin-requiring type 2 DM: The pattern of his cbg's indicates he needs some adjustment in his therapy.  Noncompliance with cbg recording and insulin dosing: I'll work around this as best I can.  PLAN: see instruction page.

## 2016-07-09 NOTE — Progress Notes (Signed)
we discussed code status.  pt requests full code, but would not want to be started or maintained on artificial life-support measures if there was not a reasonable chance of recovery 

## 2016-07-10 ENCOUNTER — Telehealth: Payer: Self-pay

## 2016-07-10 LAB — PTH, INTACT AND CALCIUM
Calcium: 9.6 mg/dL (ref 8.4–10.5)
PTH: 19 pg/mL (ref 14–64)

## 2016-07-10 LAB — HEPATITIS C ANTIBODY: HCV AB: NEGATIVE

## 2016-07-10 NOTE — Telephone Encounter (Signed)
Called patient and gave lab results. Patient had no questions or concerns.  

## 2016-07-11 ENCOUNTER — Telehealth: Payer: Self-pay

## 2016-07-11 NOTE — Telephone Encounter (Signed)
Called and left message for patient to call back to receive xray results, and information from [facility]. Gave call back number.

## 2016-07-14 ENCOUNTER — Telehealth: Payer: Self-pay | Admitting: Endocrinology

## 2016-07-14 ENCOUNTER — Other Ambulatory Visit: Payer: Self-pay | Admitting: Endocrinology

## 2016-07-14 DIAGNOSIS — R0600 Dyspnea, unspecified: Secondary | ICD-10-CM

## 2016-07-14 NOTE — Telephone Encounter (Signed)
I contacted the pt and advised Chest X-Ray results from 07/09/2016. Pt voiced understanding on results and requested a echo test to be done per Dr. Cordelia Pen request. MD notified.

## 2016-07-14 NOTE — Telephone Encounter (Signed)
PT returning your call, he said you may call him back at home.

## 2016-07-21 ENCOUNTER — Telehealth: Payer: Self-pay | Admitting: Endocrinology

## 2016-07-21 NOTE — Telephone Encounter (Signed)
Neoma Laming, Could you please review this message and advise on the progress?  Thanks!

## 2016-07-21 NOTE — Telephone Encounter (Signed)
Pt had a chest xray and now he is being asked to have a echocardiogram can we put in the referral I see the order just no referral, he wants to have it done at [facility] [phone]

## 2016-07-21 NOTE — Telephone Encounter (Signed)
See message and please advise. Thanks!

## 2016-07-21 NOTE — Telephone Encounter (Signed)
It was ordered on 07/14/16.  Please ask [facility] what is progress of referral.

## 2016-07-22 NOTE — Telephone Encounter (Signed)
See message to be advised.  

## 2016-07-22 NOTE — Telephone Encounter (Signed)
[facility] have been trying to contact this pt to schedule   07/15/16  LMOM TO CALL AND SCHEDULE/D.MILLER

## 2016-08-20 ENCOUNTER — Ambulatory Visit (HOSPITAL_COMMUNITY): Payer: PPO | Attending: Cardiovascular Disease

## 2016-08-20 ENCOUNTER — Other Ambulatory Visit: Payer: Self-pay

## 2016-08-20 DIAGNOSIS — R0600 Dyspnea, unspecified: Secondary | ICD-10-CM | POA: Diagnosis not present

## 2016-08-20 DIAGNOSIS — I517 Cardiomegaly: Secondary | ICD-10-CM | POA: Insufficient documentation

## 2016-08-21 ENCOUNTER — Ambulatory Visit (INDEPENDENT_AMBULATORY_CARE_PROVIDER_SITE_OTHER): Payer: PPO | Admitting: Endocrinology

## 2016-08-21 VITALS — BP 152/82 | HR 83 | Ht 72.0 in | Wt 228.0 lb

## 2016-08-21 DIAGNOSIS — Z794 Long term (current) use of insulin: Secondary | ICD-10-CM | POA: Diagnosis not present

## 2016-08-21 DIAGNOSIS — N19 Unspecified kidney failure: Secondary | ICD-10-CM | POA: Diagnosis not present

## 2016-08-21 DIAGNOSIS — Z23 Encounter for immunization: Secondary | ICD-10-CM

## 2016-08-21 DIAGNOSIS — H532 Diplopia: Secondary | ICD-10-CM | POA: Diagnosis not present

## 2016-08-21 DIAGNOSIS — E11319 Type 2 diabetes mellitus with unspecified diabetic retinopathy without macular edema: Secondary | ICD-10-CM | POA: Diagnosis not present

## 2016-08-21 LAB — BASIC METABOLIC PANEL
BUN: 22 mg/dL (ref 6–23)
CALCIUM: 9.5 mg/dL (ref 8.4–10.5)
CO2: 32 mEq/L (ref 19–32)
CREATININE: 1.55 mg/dL — AB (ref 0.40–1.50)
Chloride: 100 mEq/L (ref 96–112)
GFR: 47.59 mL/min — AB (ref >60.00)
Glucose, Bld: 125 mg/dL — ABNORMAL HIGH (ref 70–99)
POTASSIUM: 4.1 meq/L (ref 3.5–5.1)
Sodium: 138 mEq/L (ref 135–145)

## 2016-08-21 LAB — SEDIMENTATION RATE: Sed Rate: 5 mm/hr (ref 0–20)

## 2016-08-21 NOTE — Progress Notes (Signed)
Subjective:    Patient ID: [name], male    DOB: Jul 10, 1946, 70 y.o.   MRN: [id number]  HPI  Pt returns for f/u of diabetes mellitus: DM type: Insulin-requiring type 2 Dx'ed: Q000111Q Complications: nephropathy and retinopathy Therapy: insulin since soon after dx.   DKA: never Severe hypoglycemia: never Pancreatitis: never Other: he is on a BID insulin schedule, due to noncompliance; he takes human insulin, due to cost.  He is retired.  Interval history: no cbg record, but states cbg's are well-controlled.  There is no trend throughout the day.   Pt states 1 week of slight blurry vision from both eyes, and assoc diplopia.  He saw optometrist yesterday--he says exam was normal.   Past Medical History:  Diagnosis Date  . Adenomatous colon polyp 06/2001  . ALLERGIC RHINITIS 08/03/2007  . BENIGN PROSTATIC HYPERTROPHY 08/03/2007  . 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 DIABETIC RETINOPATHY, BACKGROUND 09/29/2008  . DYSPHAGIA UNSPECIFIED 09/29/2008  . 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 GERD 11/28/2008  . HYPERLIPIDEMIA 08/03/2007  . 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 PAIN IN SOFT TISSUES OF LIMB 01/07/2008  . UNSPECIFIED ANEMIA 12/29/2007  . Unspecified essential hypertension 01/07/2008  . URINARY CALCULUS 09/29/2008    Past Surgical History:  Procedure Laterality Date  . LITHOTRIPSY    . SHOULDER SURGERY     left, cyst and tumor removed    Social History   Social History  . Marital status: Married    Spouse name: N/A  . Number of children: 2  . Years of education: N/A   Occupational History  . [employer]   Social History Main Topics  . Smoking status: Never Smoker  . Smokeless tobacco: Never Used  . Alcohol use Yes     Comment: occasional  . Drug use: No  . Sexual activity: Not on file   Other Topics Concern  . Not on file   Social History Narrative   Daily Caffeine Use 1-2 daily    Current Outpatient Prescriptions on File  Prior to Visit  Medication Sig Dispense Refill  . aspirin 81 MG tablet Take 81 mg by mouth daily.    Marland Kitchen glucose blood (ONE TOUCH ULTRA TEST) test strip 1 each by Other route 2 (two) times daily. And lancets 2/day 250.43 100 each 12  . insulin NPH-regular Human (NOVOLIN 70/30) (70-30) 100 UNIT/ML injection 140 units with breakfast and 50 units with supper, and syringes 3/day 70 mL 11  . Insulin Pen Needle 31G X 5 MM MISC Use as directed 2 X daily. BD ULTRAPFINE MINI. Dx: 250.00 100 each 5  . Lactulose 20 GM/30ML SOLN Take 30 mLs (20 g total) by mouth 2 (two) times daily. 1892 mL 11  . Lancets (ONETOUCH ULTRASOFT) lancets Use as instructed 2x daily Dx: 250.00 100 each 5  . losartan-hydrochlorothiazide (HYZAAR) 100-12.5 MG tablet Take 1 tablet by mouth daily. 30 tablet 11  . meloxicam (MOBIC) 15 MG tablet Take 1 tablet (15 mg total) by mouth daily. 30 tablet 0  . metoprolol succinate (TOPROL-XL) 25 MG 24 hr tablet Take 1 tablet (25 mg total) by mouth daily. 90 tablet 3  . NON FORMULARY Reported on 11/09/2015    . omeprazole (PRILOSEC) 40 MG capsule TAKE ONE (1) CAPSULE EACH DAY 30 capsule 11  . simvastatin (ZOCOR) 40 MG tablet Take 1 tablet (40 mg total) by mouth at bedtime. 90 tablet 0  . vitamin E 400 UNIT capsule  Take 400 Units by mouth daily.     No current facility-administered medications on file prior to visit.     Allergies  Allergen Reactions  . Sulfamethoxazole-Trimethoprim     REACTION: Rash    Family History  Problem Relation Age of Onset  . Diabetes Mother   . Prostate cancer Brother   . Diabetes Brother     x 3 brother  . Heart disease Brother     BP (!) 152/82   Pulse 83   Ht 6' (1.829 m)   Wt 228 lb (103.4 kg)   SpO2 93%   BMI 30.92 kg/m   Review of Systems He has slight headache (right parietal), and LOC.     Objective:   Physical Exam VITAL SIGNS:  See vs page GENERAL: no distress head: no deformity  eyes: no periorbital swelling, no proptosis    external nose and ears are normal  mouth: no lesion seen Both eac's and tm's are normal Neuro: CN 2-12 tested and intact bilaterally.      Assessment & Plan:  Insulin-requiring type 2 DM: apparently well-controlled.   Visual disturbance, new, uncertain etiology.

## 2016-08-21 NOTE — Patient Instructions (Addendum)
On this type of insulin schedule, you should eat meals on a regular schedule.  If a meal is missed or significantly delayed, your blood sugar could go low.  check your blood sugar twice a day.  vary the time of day when you check, between before the 3 meals, and at bedtime.  also check if you have symptoms of your blood sugar being too high or too low.  please keep a record of the readings and bring it to your next appointment here.  You can write it on any piece of paper.  please call us sooner if your blood sugar goes below 70, or if you have a lot of readings over 200.  blood tests are requested for you today.  We'll let you know about the results. Let's also check the MRI.  you will receive a phone call, about a day and time for an appointment.   Please come back for a follow-up appointment in 4 months.

## 2016-08-25 ENCOUNTER — Telehealth: Payer: Self-pay

## 2016-08-25 LAB — FRUCTOSAMINE: FRUCTOSAMINE: 349 umol/L — AB (ref 190–270)

## 2016-08-25 NOTE — Telephone Encounter (Signed)
I contacted the patient and advised of message via voicemail. Requested a call back from the patient to schedule two week lab appointment.

## 2016-08-25 NOTE — Telephone Encounter (Signed)
-----   Message from Renato Shin, MD sent at 08/21/2016  2:57 PM EDT ----- please call patient: Kidneys are a little more off Please continue the same medications Please recheck the blood tests in approx 2 weeks.

## 2016-08-27 DIAGNOSIS — N5201 Erectile dysfunction due to arterial insufficiency: Secondary | ICD-10-CM | POA: Diagnosis not present

## 2016-08-27 DIAGNOSIS — N401 Enlarged prostate with lower urinary tract symptoms: Secondary | ICD-10-CM | POA: Diagnosis not present

## 2016-08-27 DIAGNOSIS — R350 Frequency of micturition: Secondary | ICD-10-CM | POA: Diagnosis not present

## 2016-08-27 DIAGNOSIS — N486 Induration penis plastica: Secondary | ICD-10-CM | POA: Diagnosis not present

## 2016-08-27 DIAGNOSIS — R3914 Feeling of incomplete bladder emptying: Secondary | ICD-10-CM | POA: Diagnosis not present

## 2016-08-27 DIAGNOSIS — R351 Nocturia: Secondary | ICD-10-CM | POA: Diagnosis not present

## 2016-08-27 DIAGNOSIS — R3915 Urgency of urination: Secondary | ICD-10-CM | POA: Diagnosis not present

## 2016-09-15 DIAGNOSIS — B078 Other viral warts: Secondary | ICD-10-CM | POA: Diagnosis not present

## 2016-09-26 ENCOUNTER — Ambulatory Visit (INDEPENDENT_AMBULATORY_CARE_PROVIDER_SITE_OTHER): Payer: PPO | Admitting: Endocrinology

## 2016-09-26 ENCOUNTER — Other Ambulatory Visit: Payer: Self-pay | Admitting: Endocrinology

## 2016-09-26 VITALS — BP 142/76 | HR 83 | Ht 72.0 in | Wt 231.0 lb

## 2016-09-26 DIAGNOSIS — E11319 Type 2 diabetes mellitus with unspecified diabetic retinopathy without macular edema: Secondary | ICD-10-CM | POA: Diagnosis not present

## 2016-09-26 DIAGNOSIS — Z794 Long term (current) use of insulin: Secondary | ICD-10-CM | POA: Diagnosis not present

## 2016-09-26 DIAGNOSIS — R079 Chest pain, unspecified: Secondary | ICD-10-CM

## 2016-09-26 LAB — POCT GLYCOSYLATED HEMOGLOBIN (HGB A1C): HEMOGLOBIN A1C: 7.6

## 2016-09-26 NOTE — Progress Notes (Signed)
Subjective:    Patient ID: [name], male    DOB: 09/17/1946, 70 y.o.   MRN: [id number]  HPI Pt returns for f/u of diabetes mellitus: DM type: Insulin-requiring type 2 Dx'ed: Q000111Q Complications: nephropathy and retinopathy.  Therapy: insulin since soon after dx.   DKA: never Severe hypoglycemia: never.   Pancreatitis: never Other: he is on a BID insulin schedule, due to noncompliance; he takes human insulin, due to cost.  He is retired.  Interval history: no cbg record, but states cbg's are well-controlled.  There is no trend throughout the day.  He seldom has hypoglycemia, and these episodes are mild.   Over the past few months, he has had 4 episodes of moderate pain at the right ant chest, each lasting 10 seconds, but no assoc excessive diaphoresis.  Past Medical History:  Diagnosis Date  . Adenomatous colon polyp 06/2001  . ALLERGIC RHINITIS 08/03/2007  . BENIGN PROSTATIC HYPERTROPHY 08/03/2007  . 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 DIABETIC RETINOPATHY, BACKGROUND 09/29/2008  . DYSPHAGIA UNSPECIFIED 09/29/2008  . 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 GERD 11/28/2008  . HYPERLIPIDEMIA 08/03/2007  . 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 PAIN IN SOFT TISSUES OF LIMB 01/07/2008  . UNSPECIFIED ANEMIA 12/29/2007  . Unspecified essential hypertension 01/07/2008  . URINARY CALCULUS 09/29/2008    Past Surgical History:  Procedure Laterality Date  . LITHOTRIPSY    . SHOULDER SURGERY     left, cyst and tumor removed    Social History   Social History  . Marital status: Married    Spouse name: N/A  . Number of children: 2  . Years of education: N/A   Occupational History  . [employer]   Social History Main Topics  . Smoking status: Never Smoker  . Smokeless tobacco: Never Used  . Alcohol use Yes     Comment: occasional  . Drug use: No  . Sexual activity: Not on file   Other Topics Concern  . Not on file   Social History Narrative   Daily Caffeine Use 1-2 daily    Current Outpatient Prescriptions on File Prior to Visit  Medication Sig Dispense Refill  . aspirin 81 MG tablet Take 81 mg by mouth daily.    . insulin NPH-regular Human (NOVOLIN 70/30) (70-30) 100 UNIT/ML injection 140 units with breakfast and 50 units with supper, and syringes 3/day 70 mL 11  . Insulin Pen Needle 31G X 5 MM MISC Use as directed 2 X daily. BD ULTRAPFINE MINI. Dx: 250.00 100 each 5  . Lactulose 20 GM/30ML SOLN Take 30 mLs (20 g total) by mouth 2 (two) times daily. 1892 mL 11  . Lancets (ONETOUCH ULTRASOFT) lancets Use as instructed 2x daily Dx: 250.00 100 each 5  . losartan-hydrochlorothiazide (HYZAAR) 100-12.5 MG tablet Take 1 tablet by mouth daily. 30 tablet 11  . MAGNESIUM PO Take by mouth.    . meloxicam (MOBIC) 15 MG tablet Take 1 tablet (15 mg total) by mouth daily. 30 tablet 0  . metoprolol succinate (TOPROL-XL) 25 MG 24 hr tablet Take 1 tablet (25 mg total) by mouth daily. 90 tablet 3  . NON FORMULARY Reported on 11/09/2015    . omeprazole (PRILOSEC) 40 MG capsule TAKE ONE (1) CAPSULE EACH DAY 30 capsule 11  . Saw Palmetto-Phytosterols (PROSTATE SR PO) Take by mouth.    . simvastatin (ZOCOR) 40 MG tablet Take 1 tablet (40 mg total) by mouth at bedtime. Lozano  tablet 0  . vitamin E 400 UNIT capsule Take 400 Units by mouth daily.     No current facility-administered medications on file prior to visit.     Allergies  Allergen Reactions  . Sulfamethoxazole-Trimethoprim     REACTION: Rash    Family History  Problem Relation Age of Onset  . Diabetes Mother   . Prostate cancer Brother   . Diabetes Brother     x 3 brother  . Heart disease Brother     BP (!) 142/76   Pulse 83   Ht 6' (1.829 m)   Wt 231 lb (104.8 kg)   SpO2 97%   BMI 31.33 kg/m    Review of Systems He denies LOC    Objective:   Physical Exam VITAL SIGNS:  See vs page GENERAL: no distress LUNGS:  Clear to auscultation HEART:  Regular rate and rhythm  without murmurs noted. Normal S1,S2.   Pulses: dorsalis pedis intact bilat.   MSK: no deformity of the feet.  CV: no leg edema. Skin:  no ulcer on the feet, but there are bilat heavy calluses.  normal color and temp on the feet. Neuro: sensation is intact to touch on the feet Ext: contusion of the left great toenail is again noted  A1c=7.6%  i personally reviewed electrocardiogram tracing (today): Indication: chest pain Impression: normal    Assessment & Plan:  Chest pain, new Insulin-requiring type 2 DM, with nephropathy: this is the best control this pt should aim for, given this regimen, which does match insulin to his changing needs throughout the day  Patient is advised the following: Patient Instructions  Please continue the same insulin On this type of insulin schedule, you should eat meals on a regular schedule.  If a meal is missed or significantly delayed, your blood sugar could go low.  check your blood sugar twice a day.  vary the time of day when you check, between before the 3 meals, and at bedtime.  also check if you have symptoms of your blood sugar being too high or too low.  please keep a record of the readings and bring it to your next appointment here.  You can write it on any piece of paper.  please call us sooner if your blood sugar goes below 70, or if you have a lot of readings over 200.  Let's check  "treadmill" test.  you will receive a phone call, about a day and time for an appointment.  Please come back for a follow-up appointment in 4 months.

## 2016-09-26 NOTE — Patient Instructions (Addendum)
Please continue the same insulin On this type of insulin schedule, you should eat meals on a regular schedule.  If a meal is missed or significantly delayed, your blood sugar could go low.  check your blood sugar twice a day.  vary the time of day when you check, between before the 3 meals, and at bedtime.  also check if you have symptoms of your blood sugar being too high or too low.  please keep a record of the readings and bring it to your next appointment here.  You can write it on any piece of paper.  please call us sooner if your blood sugar goes below 70, or if you have a lot of readings over 200.  Let's check  "treadmill" test.  you will receive a phone call, about a day and time for an appointment.  Please come back for a follow-up appointment in 4 months.

## 2016-10-01 IMAGING — US US ABDOMEN LIMITED
1 series · 14 of 25 positions shown · non-contrast
Comparison: CT scan of [DATE].

CLINICAL DATA: Acute right upper quadrant abdominal pain.

EXAM:
US ABDOMEN LIMITED - RIGHT UPPER QUADRANT

[Series 1: us abdomen limited · 0.25mm/px · 14 of 60 slices shown]
[im 1/60]
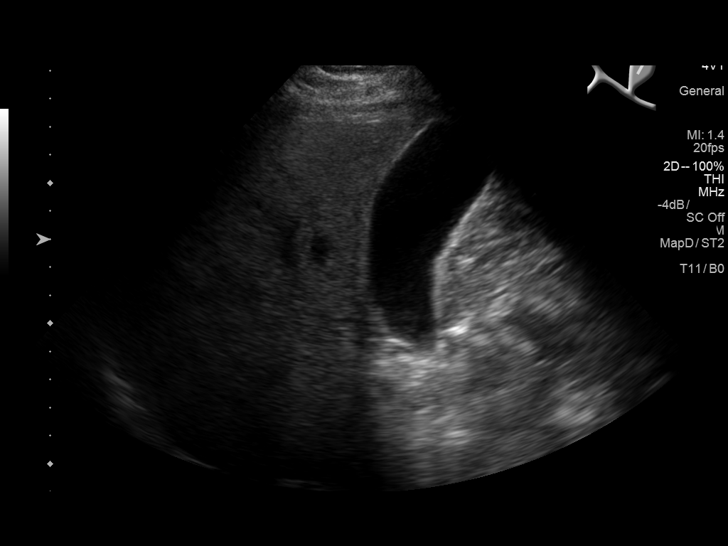
[im 5/60]
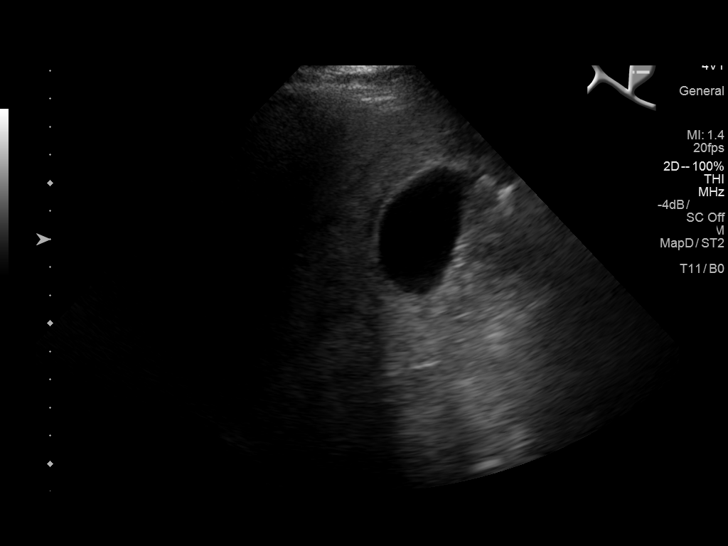
[im 10/60]
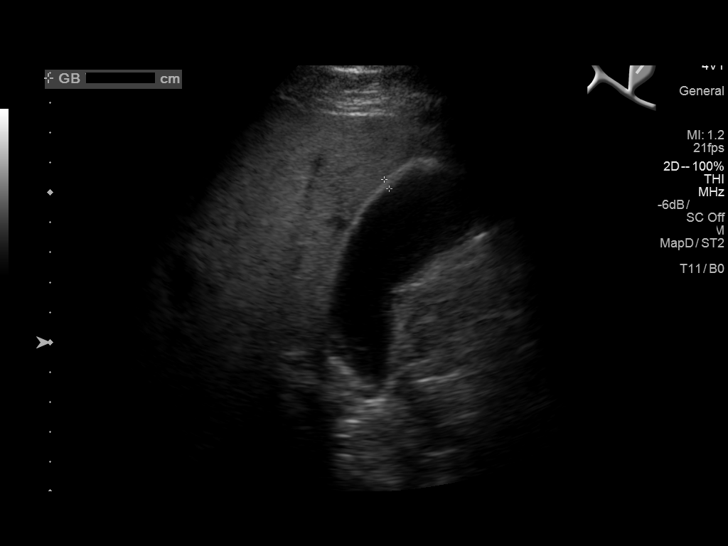
[im 15/60]
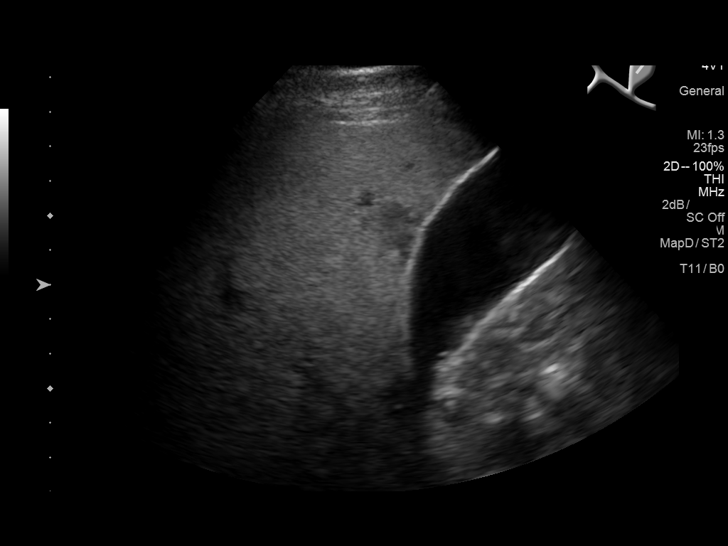
[im 20/60]
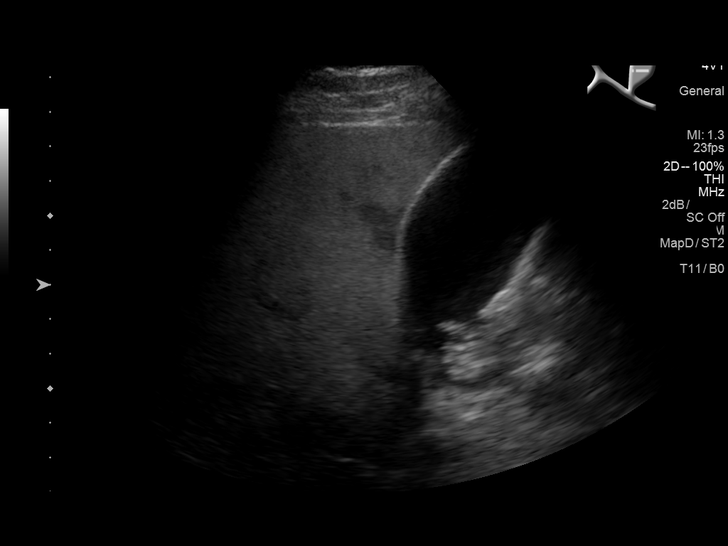
[im 23/60]
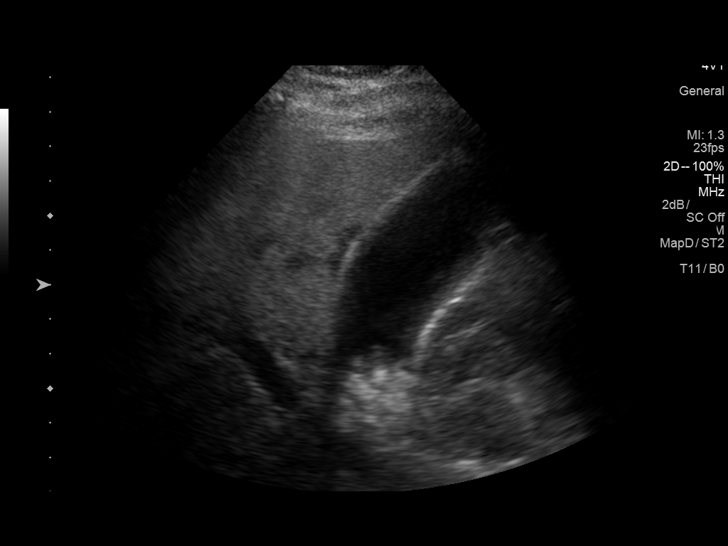
[im 28/60]
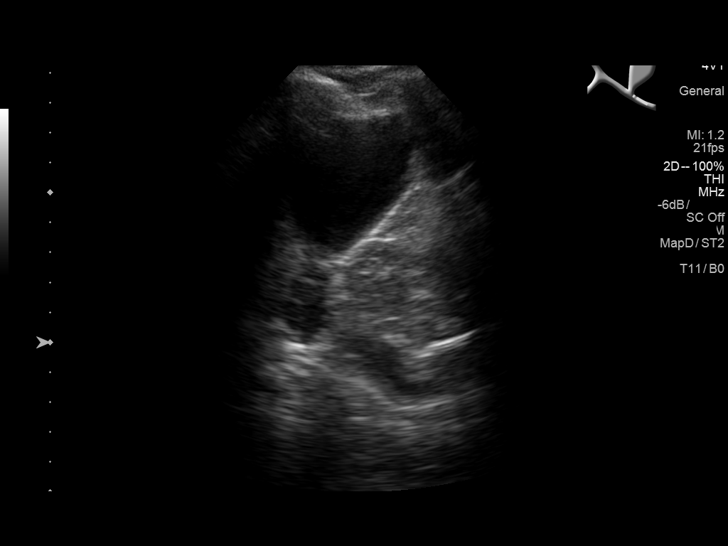
[im 32/60]
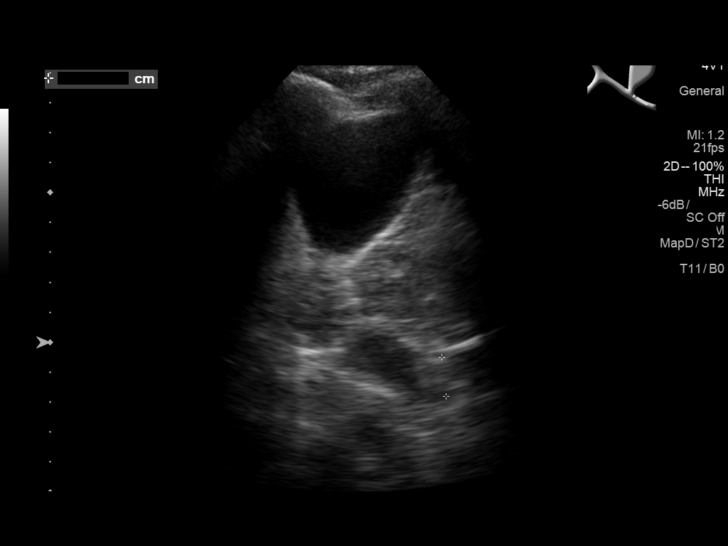
[im 37/60]
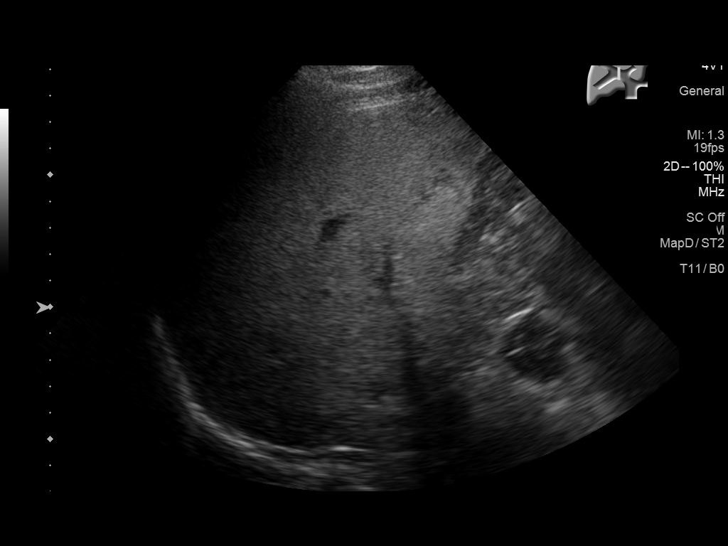
[im 40/60]
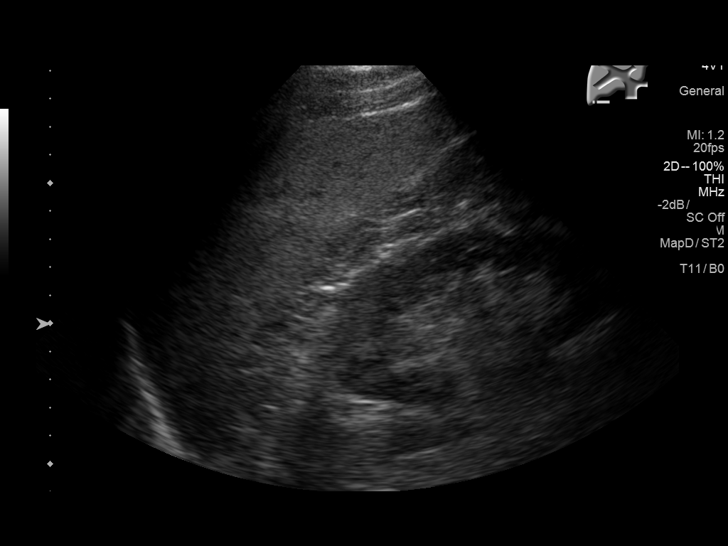
[im 45/60]
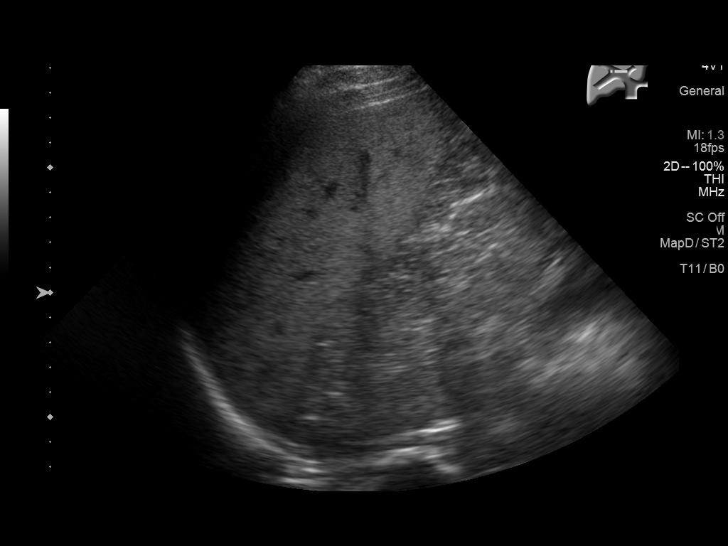
[im 50/60]
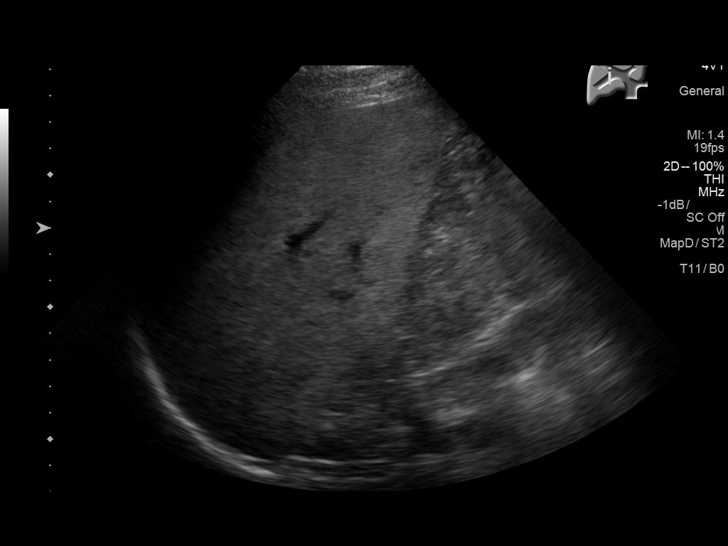
[im 55/60]
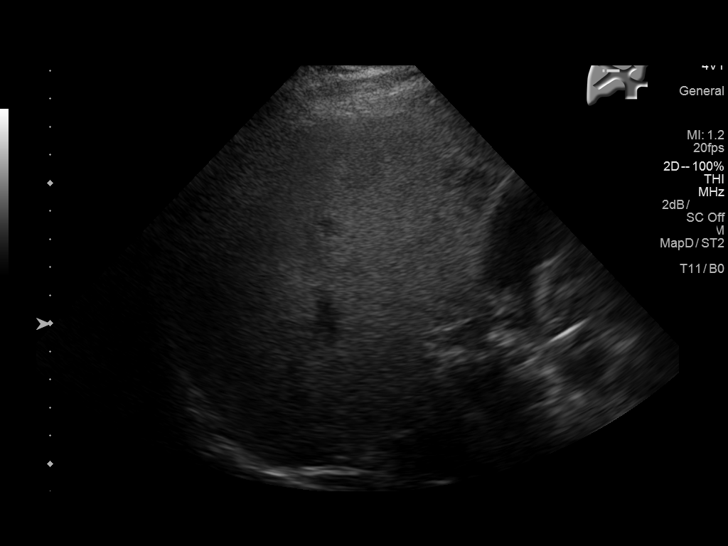
[im 60/60]
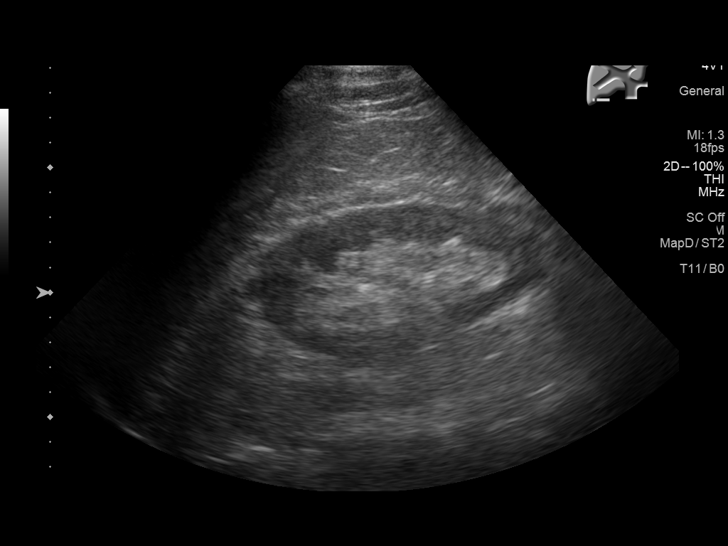

[14 of 25 positions shown; findings below may reference images not displayed]

FINDINGS: Gallbladder:

Multiple gallstones are noted toward the neck of the gallbladder.
Mild gallbladder wall thickening is measured at 3.5 mm. Positive
sonographic Murphy's sign is noted, as well as a minimal amount of
pericholecystic fluid.

Common bile duct:

Diameter: Dilated at 13 mm with multiple echogenic foci within it
suggesting choledocholithiasis.

Liver:

No focal lesion identified. Increased echogenicity of hepatic
parenchyma is noted consistent with fatty infiltration with sparing
adjacent to gallbladder fossa.
IMPRESSION: Cholelithiasis is noted with mild gallbladder wall thickening, as
well as positive sonographic Murphy sign and minimal amount of
pericholecystic fluid. This is concerning for possible
cholecystitis.

Dilated common bile duct is noted with multiple stones within it
suggesting choledocholithiasis. ERCP is recommended.

Probable fatty infiltration of the liver.

## 2016-10-07 ENCOUNTER — Encounter: Payer: Self-pay | Admitting: Endocrinology

## 2016-10-08 ENCOUNTER — Ambulatory Visit (INDEPENDENT_AMBULATORY_CARE_PROVIDER_SITE_OTHER): Payer: PPO

## 2016-10-08 DIAGNOSIS — R079 Chest pain, unspecified: Secondary | ICD-10-CM

## 2016-10-08 LAB — EXERCISE TOLERANCE TEST
CHL CUP MPHR: 152 {beats}/min
CHL CUP RESTING HR STRESS: 96 {beats}/min
CSEPEDS: 0 s
CSEPHR: 91 %
CSEPPHR: 139 {beats}/min
Estimated workload: 7 METS
Exercise duration (min): 6 min
RPE: 16

## 2016-10-09 ENCOUNTER — Ambulatory Visit: Payer: PPO | Admitting: Endocrinology

## 2016-10-13 DIAGNOSIS — B078 Other viral warts: Secondary | ICD-10-CM | POA: Diagnosis not present

## 2016-10-30 ENCOUNTER — Telehealth: Payer: Self-pay | Admitting: Endocrinology

## 2016-10-30 ENCOUNTER — Ambulatory Visit: Payer: PPO | Admitting: Physician Assistant

## 2016-10-30 NOTE — Telephone Encounter (Signed)
Patient was made an appointment   in error by another doctors office. Office call and stated he came in with chest pain, she stated they could not see. She was asking in [place] could see him today.I asked Loanne Drilling if he could see patient for chest pains, he stated patient needed to go the ER.

## 2016-10-30 NOTE — Telephone Encounter (Signed)
Noted  

## 2016-11-04 ENCOUNTER — Other Ambulatory Visit: Payer: Self-pay | Admitting: Endocrinology

## 2016-11-06 ENCOUNTER — Other Ambulatory Visit: Payer: Self-pay | Admitting: Endocrinology

## 2016-11-06 DIAGNOSIS — R0781 Pleurodynia: Secondary | ICD-10-CM | POA: Diagnosis not present

## 2016-11-06 DIAGNOSIS — R0789 Other chest pain: Secondary | ICD-10-CM | POA: Diagnosis not present

## 2016-11-06 DIAGNOSIS — N62 Hypertrophy of breast: Secondary | ICD-10-CM | POA: Diagnosis not present

## 2016-11-06 DIAGNOSIS — N644 Mastodynia: Secondary | ICD-10-CM | POA: Diagnosis not present

## 2016-11-10 ENCOUNTER — Other Ambulatory Visit: Payer: Self-pay | Admitting: Family Medicine

## 2016-11-10 DIAGNOSIS — N62 Hypertrophy of breast: Secondary | ICD-10-CM

## 2016-11-10 DIAGNOSIS — N644 Mastodynia: Secondary | ICD-10-CM

## 2016-11-11 ENCOUNTER — Emergency Department: Payer: PPO

## 2016-11-11 ENCOUNTER — Inpatient Hospital Stay
Admission: EM | Admit: 2016-11-11 | Discharge: 2016-11-12 | DRG: 419 | Disposition: A | Discharge status: Home / Self Care | Payer: PPO | Attending: General Surgery | Admitting: General Surgery

## 2016-11-11 ENCOUNTER — Encounter: Payer: Self-pay | Admitting: Emergency Medicine

## 2016-11-11 DIAGNOSIS — Z833 Family history of diabetes mellitus: Secondary | ICD-10-CM | POA: Diagnosis not present

## 2016-11-11 DIAGNOSIS — Z8249 Family history of ischemic heart disease and other diseases of the circulatory system: Secondary | ICD-10-CM | POA: Diagnosis not present

## 2016-11-11 DIAGNOSIS — I10 Essential (primary) hypertension: Secondary | ICD-10-CM | POA: Diagnosis not present

## 2016-11-11 DIAGNOSIS — K8020 Calculus of gallbladder without cholecystitis without obstruction: Secondary | ICD-10-CM | POA: Diagnosis not present

## 2016-11-11 DIAGNOSIS — Z888 Allergy status to other drugs, medicaments and biological substances status: Secondary | ICD-10-CM

## 2016-11-11 DIAGNOSIS — R1011 Right upper quadrant pain: Secondary | ICD-10-CM

## 2016-11-11 DIAGNOSIS — K810 Acute cholecystitis: Secondary | ICD-10-CM | POA: Diagnosis not present

## 2016-11-11 DIAGNOSIS — N400 Enlarged prostate without lower urinary tract symptoms: Secondary | ICD-10-CM | POA: Diagnosis not present

## 2016-11-11 DIAGNOSIS — E785 Hyperlipidemia, unspecified: Secondary | ICD-10-CM | POA: Diagnosis present

## 2016-11-11 DIAGNOSIS — Z794 Long term (current) use of insulin: Secondary | ICD-10-CM | POA: Diagnosis not present

## 2016-11-11 DIAGNOSIS — K8042 Calculus of bile duct with acute cholecystitis without obstruction: Secondary | ICD-10-CM | POA: Diagnosis not present

## 2016-11-11 DIAGNOSIS — K219 Gastro-esophageal reflux disease without esophagitis: Secondary | ICD-10-CM | POA: Diagnosis not present

## 2016-11-11 DIAGNOSIS — Z79899 Other long term (current) drug therapy: Secondary | ICD-10-CM

## 2016-11-11 DIAGNOSIS — Z7982 Long term (current) use of aspirin: Secondary | ICD-10-CM | POA: Diagnosis not present

## 2016-11-11 DIAGNOSIS — K819 Cholecystitis, unspecified: Secondary | ICD-10-CM | POA: Diagnosis present

## 2016-11-11 DIAGNOSIS — E11319 Type 2 diabetes mellitus with unspecified diabetic retinopathy without macular edema: Secondary | ICD-10-CM | POA: Diagnosis present

## 2016-11-11 DIAGNOSIS — R079 Chest pain, unspecified: Secondary | ICD-10-CM | POA: Diagnosis not present

## 2016-11-11 DIAGNOSIS — Z8042 Family history of malignant neoplasm of prostate: Secondary | ICD-10-CM

## 2016-11-11 DIAGNOSIS — J449 Chronic obstructive pulmonary disease, unspecified: Secondary | ICD-10-CM | POA: Diagnosis not present

## 2016-11-11 LAB — HEPATIC FUNCTION PANEL
ALBUMIN: 3.8 g/dL (ref 3.5–5.0)
ALT: 29 U/L (ref 17–63)
AST: 33 U/L (ref 15–41)
Alkaline Phosphatase: 40 U/L (ref 38–126)
BILIRUBIN DIRECT: 0.2 mg/dL (ref 0.1–0.5)
BILIRUBIN TOTAL: 1 mg/dL (ref 0.3–1.2)
Indirect Bilirubin: 0.8 mg/dL (ref 0.3–0.9)
Total Protein: 7.3 g/dL (ref 6.5–8.1)

## 2016-11-11 LAB — BASIC METABOLIC PANEL
Anion gap: 12 (ref 5–15)
BUN: 24 mg/dL — ABNORMAL HIGH (ref 6–20)
CALCIUM: 9.6 mg/dL (ref 8.9–10.3)
CO2: 22 mmol/L (ref 22–32)
CREATININE: 1.53 mg/dL — AB (ref 0.61–1.24)
Chloride: 103 mmol/L (ref 101–111)
GFR, EST AFRICAN AMERICAN: 52 mL/min — AB (ref >60)
GFR, EST NON AFRICAN AMERICAN: 45 mL/min — AB (ref >60)
Glucose, Bld: 112 mg/dL — ABNORMAL HIGH (ref 65–99)
Potassium: 4 mmol/L (ref 3.5–5.1)
Sodium: 137 mmol/L (ref 135–145)

## 2016-11-11 LAB — LIPASE, BLOOD: Lipase: 16 U/L (ref 11–51)

## 2016-11-11 LAB — CBC
HCT: 54.2 % — ABNORMAL HIGH (ref 40.0–52.0)
Hemoglobin: 18.6 g/dL — ABNORMAL HIGH (ref 13.0–18.0)
MCH: 31.4 pg (ref 26.0–34.0)
MCHC: 34.2 g/dL (ref 32.0–36.0)
MCV: 91.7 fL (ref 80.0–100.0)
PLATELETS: 234 10*3/uL (ref 150–440)
RBC: 5.91 MIL/uL — AB (ref 4.40–5.90)
RDW: 14.4 % (ref 11.5–14.5)
WBC: 17.2 10*3/uL — ABNORMAL HIGH (ref 3.8–10.6)

## 2016-11-11 LAB — TROPONIN I

## 2016-11-11 LAB — URINALYSIS, COMPLETE (UACMP) WITH MICROSCOPIC
Bacteria, UA: NONE SEEN
Bilirubin Urine: NEGATIVE
GLUCOSE, UA: NEGATIVE mg/dL
Hgb urine dipstick: NEGATIVE
Ketones, ur: 5 mg/dL — AB
Leukocytes, UA: NEGATIVE
Nitrite: NEGATIVE
PH: 5 (ref 5.0–8.0)
Protein, ur: 100 mg/dL — AB
Specific Gravity, Urine: 1.02 (ref 1.005–1.030)

## 2016-11-11 LAB — GLUCOSE, CAPILLARY
GLUCOSE-CAPILLARY: 188 mg/dL — AB (ref 65–99)
GLUCOSE-CAPILLARY: 210 mg/dL — AB (ref 65–99)
GLUCOSE-CAPILLARY: 221 mg/dL — AB (ref 65–99)

## 2016-11-11 LAB — SURGICAL PCR SCREEN
MRSA, PCR: NEGATIVE
STAPHYLOCOCCUS AUREUS: NEGATIVE

## 2016-11-11 IMAGING — DX DG CHEST 1V PORT
1 series · 1 of 1 positions shown · non-contrast
Comparison: [DATE]

CLINICAL DATA: Right-sided chest pain for couple weeks. Began
vomiting at midnight tonight.

EXAM:
PORTABLE CHEST 1 VIEW

[chest ap]
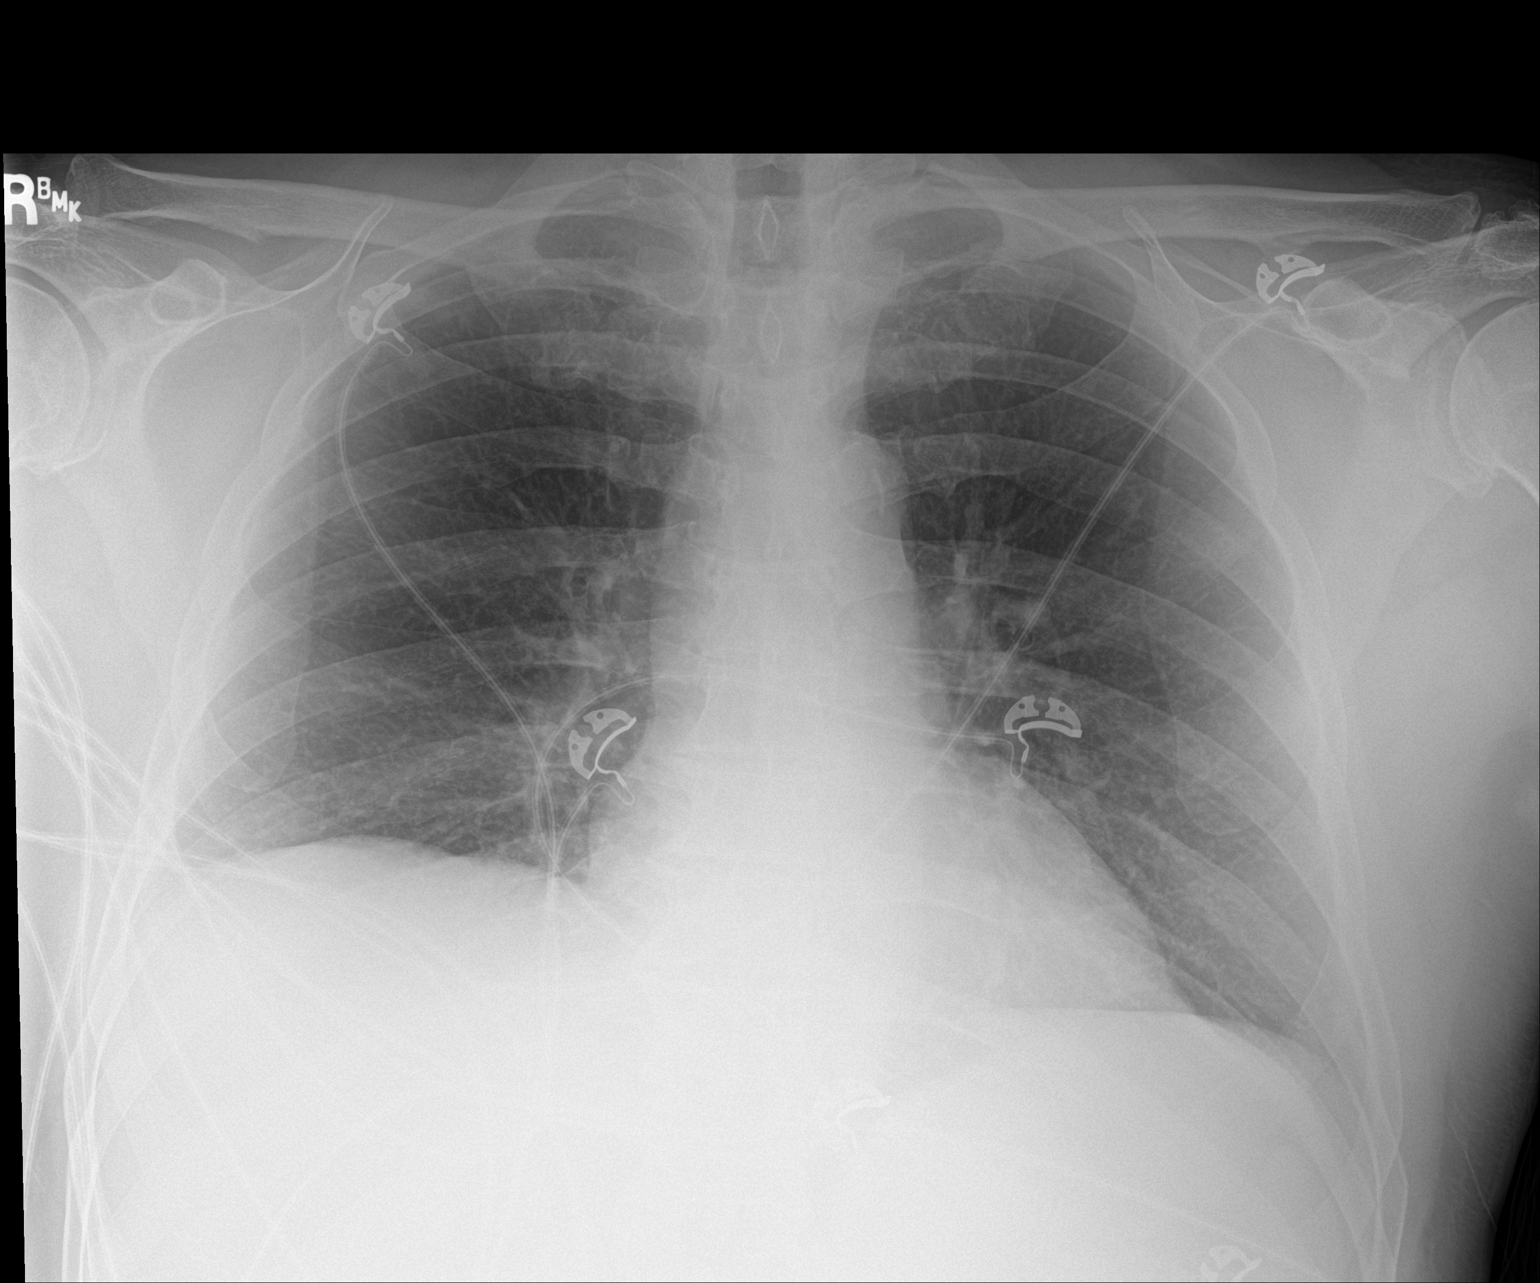

[1 of 1 positions shown; findings below may reference images not displayed]

FINDINGS: A single AP portable view of the chest demonstrates no focal
airspace consolidation or alveolar edema. The lungs are grossly
clear. There is no large effusion or pneumothorax. Cardiac and
mediastinal contours appear unremarkable.
IMPRESSION: No active disease.

## 2016-11-11 MED ORDER — ONDANSETRON 4 MG PO TBDP: 4.0000 mg | ORAL_TABLET | Freq: Four times a day (QID) | ORAL | Status: DC | PRN

## 2016-11-11 MED ORDER — DIPHENHYDRAMINE HCL 50 MG/ML IJ SOLN: 12.5000 mg | Freq: Four times a day (QID) | INTRAMUSCULAR | Status: DC | PRN

## 2016-11-11 MED ORDER — ONDANSETRON HCL 4 MG/2ML IJ SOLN
4.0000 mg | Freq: Once | INTRAMUSCULAR | Status: AC
  Administered 2016-11-11: 4 mg via INTRAVENOUS
  Filled 2016-11-11: qty 2

## 2016-11-11 MED ORDER — LACTATED RINGERS IV SOLN
INTRAVENOUS | Status: DC
  Administered 2016-11-11 – 2016-11-12 (×3): via INTRAVENOUS

## 2016-11-11 MED ORDER — PIPERACILLIN-TAZOBACTAM 3.375 G IVPB
3.3750 g | Freq: Three times a day (TID) | INTRAVENOUS | Status: DC
  Administered 2016-11-11 – 2016-11-12 (×3): 3.375 g via INTRAVENOUS
  Filled 2016-11-11 (×3): qty 50

## 2016-11-11 MED ORDER — ENOXAPARIN SODIUM 40 MG/0.4ML ~~LOC~~ SOLN
40.0000 mg | SUBCUTANEOUS | Status: DC
  Administered 2016-11-11: 40 mg via SUBCUTANEOUS
  Filled 2016-11-11: qty 0.4

## 2016-11-11 MED ORDER — SODIUM CHLORIDE 0.9 % IV BOLUS (SEPSIS)
1000.0000 mL | Freq: Once | INTRAVENOUS | Status: AC
Start: 2016-11-11 — End: 2016-11-11
  Administered 2016-11-11: 1000 mL via INTRAVENOUS

## 2016-11-11 MED ORDER — ONDANSETRON HCL 4 MG/2ML IJ SOLN
4.0000 mg | Freq: Four times a day (QID) | INTRAMUSCULAR | Status: DC | PRN
  Administered 2016-11-11: 4 mg via INTRAVENOUS
  Filled 2016-11-11: qty 2

## 2016-11-11 MED ORDER — MORPHINE SULFATE (PF) 4 MG/ML IV SOLN
4.0000 mg | INTRAVENOUS | Status: DC | PRN
  Administered 2016-11-11 – 2016-11-12 (×3): 4 mg via INTRAVENOUS
  Filled 2016-11-11 (×3): qty 1

## 2016-11-11 MED ORDER — HYDRALAZINE HCL 20 MG/ML IJ SOLN: 10.0000 mg | INTRAMUSCULAR | Status: DC | PRN

## 2016-11-11 MED ORDER — SODIUM CHLORIDE 0.9 % IV BOLUS (SEPSIS)
1000.0000 mL | Freq: Once | INTRAVENOUS | Status: AC
  Administered 2016-11-11: 1000 mL via INTRAVENOUS

## 2016-11-11 MED ORDER — INSULIN ASPART 100 UNIT/ML ~~LOC~~ SOLN
0.0000 [IU] | SUBCUTANEOUS | Status: DC
  Administered 2016-11-11 – 2016-11-12 (×4): 7 [IU] via SUBCUTANEOUS
  Administered 2016-11-12: 3 [IU] via SUBCUTANEOUS
  Administered 2016-11-12: 7 [IU] via SUBCUTANEOUS
  Filled 2016-11-11 (×4): qty 7
  Filled 2016-11-11: qty 3

## 2016-11-11 MED ORDER — DIPHENHYDRAMINE HCL 12.5 MG/5ML PO ELIX: 12.5000 mg | ORAL_SOLUTION | Freq: Four times a day (QID) | ORAL | Status: DC | PRN

## 2016-11-11 MED ORDER — PANTOPRAZOLE SODIUM 40 MG IV SOLR
40.0000 mg | Freq: Every day | INTRAVENOUS | Status: DC
  Administered 2016-11-11: 40 mg via INTRAVENOUS
  Filled 2016-11-11: qty 40

## 2016-11-11 NOTE — ED Provider Notes (Signed)
[facility] Emergency Department Provider Note   First MD Initiated Contact with Patient 11/11/16 [phone]     (approximate)  I have reviewed the triage vital signs and the nursing notes.   HISTORY  Chief Complaint Chest Pain    HPI [name] is a 70 y.o. male presents with acute onset of vomiting since midnight tonight. Patient admits to right lower chest/right upper quadrant abdominal pain for "couple weeks". Patient was seen by his primary care provider will plan to perform an ultrasound has not been performed today. Patient denies any fever, no diarrhea or any urinary symptoms.   Past Medical History:  Diagnosis Date  . Adenomatous colon polyp 06/2001  . ALLERGIC RHINITIS 08/03/2007  . BENIGN PROSTATIC HYPERTROPHY 08/03/2007  . 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 DIABETIC RETINOPATHY, BACKGROUND 09/29/2008  . DYSPHAGIA UNSPECIFIED 09/29/2008  . 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 GERD 11/28/2008  . HYPERLIPIDEMIA 08/03/2007  . 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 PAIN IN SOFT TISSUES OF LIMB 01/07/2008  . UNSPECIFIED ANEMIA 12/29/2007  . Unspecified essential hypertension 01/07/2008  . URINARY CALCULUS 09/29/2008    Patient Active Problem List   Diagnosis Date Noted  . Chest pain 09/26/2016  . Diplopia 08/21/2016  . Renal failure 08/21/2016  . Dyspnea 07/09/2016  . Diabetes ([place]) 03/29/2016  . Hematuria 08/07/2014  . Routine general medical examination at a health care facility 09/08/2013  . Hearing loss of aging 09/07/2013  . Screening for prostate cancer 05/04/2013  . Encounter for long-term (current) use of other medications 05/04/2013  . Contusion of ankle or foot, right 05/07/2012  . KNEE PAIN, RIGHT 12/20/2010  . PROTEINURIA, MILD 05/14/2010  . HIP PAIN, LEFT 11/13/2009  . 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 GERD 11/28/2008  . CONSTIPATION 11/28/2008  . OTHER DYSPHAGIA 11/28/2008  . 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[place]) 09/29/2008  . URINARY CALCULUS 09/29/2008  . 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 DYSPHAGIA UNSPECIFIED 09/29/2008  . UNSPECIFIED ESSENTIAL HYPERTENSION 01/07/2008  . PAIN IN SOFT TISSUES OF LIMB 01/07/2008  . PURE HYPERCHOLESTEROLEMIA 12/29/2007  . UNSPECIFIED ANEMIA 12/29/2007  . Dyslipidemia 08/03/2007  . ALLERGIC RHINITIS 08/03/2007  . BENIGN PROSTATIC HYPERTROPHY 08/03/2007  . COLONIC POLYPS, HX OF 08/03/2007    Past Surgical History:  Procedure Laterality Date  . LITHOTRIPSY    . SHOULDER SURGERY     left, cyst and tumor removed    Prior to Admission medications   Medication Sig Start Date End Date Taking? Authorizing Provider  aspirin 81 MG tablet Take 81 mg by mouth daily.    Historical Provider, MD  insulin NPH-regular Human (NOVOLIN 70/30) (70-30) 100 UNIT/ML injection 140 units with breakfast and 50 units with supper, and syringes 3/day 07/09/16   Renato Shin, MD  Insulin Pen Needle 31G X 5 MM MISC Use as directed 2 X daily. BD ULTRAPFINE MINI. Dx: 250.00 06/13/13   Renato Shin, MD  Lactulose 20 GM/30ML SOLN Take 30 mLs (20 g total) by mouth 2 (two) times daily. 10/26/15   Renato Shin, MD  Lancets [facility] ULTRASOFT) lancets Use as instructed 2x daily Dx: 250.00 08/29/15   Renato Shin, MD  losartan-hydrochlorothiazide (HYZAAR) 100-12.5 MG tablet Take 1 tablet by mouth daily. 04/01/16   Renato Shin, MD  MAGNESIUM PO Take by mouth.    Historical Provider, MD  meloxicam (MOBIC) 15 MG tablet Take 1 tablet (15 mg total) by mouth daily. 06/30/14   Viona Gilmore  [place], DPM  metoprolol succinate (TOPROL-XL) 25 MG 24 hr tablet Take 1 tablet (25 mg total) by mouth daily. 12/31/15   Renato Shin, MD  NON FORMULARY Reported on 11/09/2015    Historical Provider, MD  NOVOLIN 70/30 RELION (70-30) 100 UNIT/ML injection INJECT 150 UNITS SUBCUTANEOUSLY WITH BREAKFAST AND 60 UNITS WITH SUPPER 11/06/16   Renato Shin, MD  omeprazole (PRILOSEC) 40 MG  capsule TAKE ONE (1) CAPSULE EACH DAY 06/16/16   Renato Shin, MD  ONE TOUCH ULTRA TEST test strip TWICE DAILY 09/26/16   Renato Shin, MD  Saw Palmetto-Phytosterols (PROSTATE SR PO) Take by mouth.    Historical Provider, MD  simvastatin (ZOCOR) 40 MG tablet TAKE ONE TABLET BY MOUTH EVERY NIGHT AT BEDTIME 11/04/16   Renato Shin, MD  simvastatin (ZOCOR) 40 MG tablet TAKE ONE TABLET BY MOUTH EVERY NIGHT AT BEDTIME 11/04/16   Renato Shin, MD  vitamin E 400 UNIT capsule Take 400 Units by mouth daily.    Historical Provider, MD    Allergies Sulfamethoxazole-trimethoprim  Family History  Problem Relation Age of Onset  . Diabetes Mother   . Prostate cancer Brother   . Diabetes Brother     x 3 brother  . Heart disease Brother     Social History Social History  Substance Use Topics  . Smoking status: Never Smoker  . Smokeless tobacco: Never Used  . Alcohol use Yes     Comment: occasional    Review of Systems Constitutional: No fever/chills Eyes: No visual changes. ENT: No sore throat. Cardiovascular: Denies chest pain. Respiratory: Denies shortness of breath. Gastrointestinal: Positive for abdominal pain and vomiting Genitourinary: Negative for dysuria. Musculoskeletal: Negative for back pain. Skin: Negative for rash. Neurological: Negative for headaches, focal weakness or numbness.  10-point ROS otherwise negative.  ____________________________________________   PHYSICAL EXAM:  VITAL SIGNS: ED Triage Vitals  Enc Vitals Group     BP 11/11/16 0400 135/77     Pulse Rate 11/11/16 0400 92     Resp 11/11/16 0400 16     Temp --      Temp src --      SpO2 11/11/16 0400 96 %     Weight 11/11/16 0333 230 lb (104.3 kg)     Height 11/11/16 0333 6' (1.829 m)     Head Circumference --      Peak Flow --      Pain Score 11/11/16 0336 5     Pain Loc --      Pain Edu? --      Excl. in [place]? --     Constitutional: Alert and oriented. Well appearing and in no acute  distress. Eyes: Conjunctivae are normal. PERRL. EOMI. Head: Atraumatic. Mouth/Throat: Mucous membranes are moist.  Oropharynx non-erythematous. Neck: No stridor.   Cardiovascular: Normal rate, regular rhythm. Good peripheral circulation. Grossly normal heart sounds. Respiratory: Normal respiratory effort.  No retractions. Lungs CTAB. Gastrointestinal: Right upper quadrant tenderness to palpation. No distention.  Musculoskeletal: No lower extremity tenderness nor edema. No gross deformities of extremities. Neurologic:  Normal speech and language. No gross focal neurologic deficits are appreciated.  Skin:  Skin is warm, dry and intact. No rash noted. Psychiatric: Mood and affect are normal. Speech and behavior are normal.  ____________________________________________   LABS (all labs ordered are listed, but only abnormal results are displayed)  Labs Reviewed  BASIC METABOLIC PANEL - Abnormal; Notable for the following:       Result Value   Glucose, Bld 112 (*)  BUN 24 (*)    Creatinine, Ser 1.53 (*)    GFR calc non Af Amer 45 (*)    GFR calc Af Amer 52 (*)    All other components within normal limits  CBC - Abnormal; Notable for the following:    WBC 17.2 (*)    RBC 5.91 (*)    Hemoglobin 18.6 (*)    HCT 54.2 (*)    All other components within normal limits  TROPONIN I  LIPASE, BLOOD   ____________________________________________  EKG  ED ECG REPORT I, [place] N BROWN, the attending physician, personally viewed and interpreted this ECG.   Date: 11/11/2016  EKG Time: 3:36 AM  Rate: 99  Rhythm: Normal sinus rhythm  Axis: Normal  Intervals: Normal  ST&T Change: None  ____________________________________________  RADIOLOGY I, [place] N BROWN, personally viewed and evaluated these images (plain radiographs) as part of my medical decision making, as well as reviewing the written report by the radiologist.  Dg Chest Port 1 View  Result Date: 11/11/2016 CLINICAL  DATA:  Right-sided chest pain for couple weeks. Began vomiting at midnight tonight. EXAM: PORTABLE CHEST 1 VIEW COMPARISON:  07/09/2016 FINDINGS: A single AP portable view of the chest demonstrates no focal airspace consolidation or alveolar edema. The lungs are grossly clear. There is no large effusion or pneumothorax. Cardiac and mediastinal contours appear unremarkable. IMPRESSION: No active disease. Electronically Signed   By: Andreas Newport M.D.   On: 11/11/2016 03:58     Procedures      INITIAL IMPRESSION / ASSESSMENT AND PLAN / ED COURSE  Pertinent labs & imaging results that were available during my care of the patient were reviewed by me and considered in my medical decision making (see chart for details).  Patient received IV morphine and Zofran on presentation to the ED treatment room 70 year old male presents with abdominal pain and vomiting consistent with possible cholelithiasis versus cholecystitis as such ultrasound pending at this time. Patient's care transferred to Dr. Darl Householder   Clinical Course     ____________________________________________  FINAL CLINICAL IMPRESSION(S) / ED DIAGNOSES  Acute cholecystitis  MEDICATIONS GIVEN DURING THIS VISIT:  Medications  ondansetron (ZOFRAN) injection 4 mg (4 mg Intravenous Given 11/11/16 0345)  sodium chloride 0.9 % bolus 1,000 mL (1,000 mLs Intravenous New Bag/Given 11/11/16 0345)     NEW OUTPATIENT MEDICATIONS STARTED DURING THIS VISIT:  New Prescriptions   No medications on file    Modified Medications   No medications on file    Discontinued Medications   No medications on file     Note:  This document was prepared using Dragon voice recognition software and may include unintentional dictation errors.    Gregor Hams, MD 11/12/16 [phone]

## 2016-11-11 NOTE — ED Provider Notes (Signed)
  Physical Exam  BP (!) 141/78   Pulse (!) 108   Resp 20   Ht 6' (1.829 m)   Wt 230 lb (104.3 kg)   SpO2 92%   BMI 31.19 kg/m   Physical Exam  ED Course  Procedures  MDM Care assumed at 7 am from Dr. Owens Shark. Patient here with RUQ pain and lower chest pain for several weeks, worse after eating and worse with laying down. Had vomiting and subjective chills yesterday. Sign out pending LFTs, ab [place]. WBC 17, LFT nl, lipase nl, US showed cholelithiasis, possible cholecystitis. Also dilated CBD at 11 mm with possible stone in CBD. I called Dr. Adonis Huguenin from surgery at [place], who will review his imaging and see patient. Will keep NPO for now.    9 am  Dr. Adonis Huguenin at bedside and will admit for cholecystectomy     Drenda Freeze, MD 11/11/16 1521

## 2016-11-11 NOTE — H&P (Addendum)
Patient ID: [name], male   DOB: 04/09/1946, 70 y.o.   MRN: [id number]  CC: Abdominal pain  HPI [name] is a 70 y.o. male who presents to emergency department for evaluation of right upper quadrant abdominal pain. Patient reports having intermittent abdominal pain for 4-5 months. The pain is also going up into his right lower chest. However, earlier today he developed nausea, vomiting, subjective fevers which prompted him to come to the emergency department. His pain worsened last night and he attempted to right off as indigestion. He also has chronic constipation and abdominal bloating. He denies any shortness of breath, diarrhea. He has had an extensive cardiac workup thinking initially this was his heart over the last several months which is all been negative and normal. Currently he states the pain is in his right upper quadrant and she was to his right lateral flank. It does not move and has not changed in quality. It is currently sharp.  HPI  Past Medical History:  Diagnosis Date  . Adenomatous colon polyp 06/2001  . ALLERGIC RHINITIS 08/03/2007  . BENIGN PROSTATIC HYPERTROPHY 08/03/2007  . 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 DIABETIC RETINOPATHY, BACKGROUND 09/29/2008  . DYSPHAGIA UNSPECIFIED 09/29/2008  . 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 GERD 11/28/2008  . HYPERLIPIDEMIA 08/03/2007  . 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 PAIN IN SOFT TISSUES OF LIMB 01/07/2008  . UNSPECIFIED ANEMIA 12/29/2007  . Unspecified essential hypertension 01/07/2008  . URINARY CALCULUS 09/29/2008    Past Surgical History:  Procedure Laterality Date  . LITHOTRIPSY    . SHOULDER SURGERY     left, cyst and tumor removed    Family History  Problem Relation Age of Onset  . Diabetes Mother   . Prostate cancer Brother   . Diabetes Brother     x 3 brother  . Heart disease Brother     Social History Social History  Substance Use Topics  . Smoking status: Never Smoker  .  Smokeless tobacco: Never Used  . Alcohol use Yes     Comment: occasional    Allergies  Allergen Reactions  . Sulfamethoxazole-Trimethoprim     REACTION: Rash    Current Facility-Administered Medications  Medication Dose Route Frequency Provider Last Rate Last Dose  . sodium chloride 0.9 % bolus 1,000 mL  1,000 mL Intravenous Once Clayburn Pert, MD       Current Outpatient Prescriptions  Medication Sig Dispense Refill  . aspirin 81 MG tablet Take 81 mg by mouth daily.    Marland Kitchen MAGNESIUM PO Take 1 tablet by mouth daily.     . meloxicam (MOBIC) 15 MG tablet Take 1 tablet (15 mg total) by mouth daily. 30 tablet 0  . metoprolol succinate (TOPROL-XL) 25 MG 24 hr tablet Take 1 tablet (25 mg total) by mouth daily. 90 tablet 3  . omeprazole (PRILOSEC) 40 MG capsule TAKE ONE (1) CAPSULE EACH DAY 30 capsule 11  . Saw Palmetto-Phytosterols (PROSTATE SR PO) Take by mouth.    . simvastatin (ZOCOR) 40 MG tablet TAKE ONE TABLET BY MOUTH EVERY NIGHT AT BEDTIME 90 tablet 3  . vitamin E 400 UNIT capsule Take 400 Units by mouth daily.    . insulin NPH-regular Human (NOVOLIN 70/30) (70-30) 100 UNIT/ML injection 140 units with breakfast and 50 units with supper, and syringes 3/day 70 mL 11  . Insulin Pen Needle 31G X 5 MM MISC Use as directed 2 X daily. BD ULTRAPFINE MINI. Dx: 250.00  100 each 5  . Lactulose 20 GM/30ML SOLN Take 30 mLs (20 g total) by mouth 2 (two) times daily. (Patient not taking: Reported on 11/11/2016) 1892 mL 11  . Lancets (ONETOUCH ULTRASOFT) lancets Use as instructed 2x daily Dx: 250.00 100 each 5  . losartan-hydrochlorothiazide (HYZAAR) 100-12.5 MG tablet Take 1 tablet by mouth daily. 30 tablet 11  . NOVOLIN 70/30 RELION (70-30) 100 UNIT/ML injection INJECT 150 UNITS SUBCUTANEOUSLY WITH BREAKFAST AND 60 UNITS WITH SUPPER 70 mL 13  . ONE TOUCH ULTRA TEST test strip TWICE DAILY 100 each 11     Review of Systems A Multi-point review of systems was asked and was negative except for the  findings documented in the history of present illness  Physical Exam Blood pressure (!) 161/80, pulse (!) 115, resp. rate (!) 26, height 6' (1.829 m), weight 104.3 kg (230 lb), SpO2 94 %. CONSTITUTIONAL: No acute distress. EYES: Pupils are equal, round, and reactive to light, Sclera are non-icteric. EARS, NOSE, MOUTH AND THROAT: The oropharynx is clear. The oral mucosa is pink and moist. Hearing is intact to voice. LYMPH NODES:  Lymph nodes in the neck are normal. RESPIRATORY:  Lungs are clear. There is normal respiratory effort, with equal breath sounds bilaterally, and without pathologic use of accessory muscles. CARDIOVASCULAR: Heart is regular without murmurs, gallops, or rubs. GI: The abdomen is large, soft, tender to palpation in the right upper quadrant with a positive Murphy sign, and nondistended. There are no palpable masses. There is no hepatosplenomegaly. There are normal bowel sounds in all quadrants. GU: Rectal deferred.   MUSCULOSKELETAL: Normal muscle strength and tone. No cyanosis or edema.   SKIN: Turgor is good and there are no pathologic skin lesions or ulcers. NEUROLOGIC: Motor and sensation is grossly normal. Cranial nerves are grossly intact. PSYCH:  Oriented to person, place and time. Affect is normal.  Data Reviewed Images and labs reviewed. Labs concerning for leukocytosis of 17.2, elevated creatinine of 1.53, elevated BUN of 24. His LFTs are within normal limits with a bilirubin of 1.0, alkaline phosphatase 40, AST of 33, ALT 29. Right upper quadrant ultrasound reviewed which does show numerous gallstones with some questionable pericholecystic fluid. There is also visualize shadowing within the common duct consistent with choledocholithiasis. I have personally reviewed the patient's imaging, laboratory findings and medical records.    Assessment    Acute cholecystitis with choledocholithiasis.    Plan    70 year old male with acute cholecystitis. Discussed the  diagnosis in detail and treatment options. I discussed the procedure of a laparoscopic cholecystectomy with intraoperative cholangiogram in detail.   We discussed the risks and benefits of a laparoscopic cholecystectomy and possible cholangiogram including, but not limited to bleeding, infection, injury to surrounding structures such as the intestine or liver, bile leak, retained gallstones, need to convert to an open procedure, prolonged diarrhea, blood clots such as  DVT, common bile duct injury, anesthesia risks, and possible need for additional procedures.  The likelihood of improvement in symptoms and return to the patient's normal status is good. We discussed the typical post-operative recovery course. Patient voiced understanding and desire to proceed. Currently, we'll plan for admission to 2 cc for IV hydration and IV antibiotics. He will be posted and consented for the operating room later today showed the scheduled appointment. Otherwise, he'll be taken to the operating first in the morning for a cholecystectomy with cholangiogram.      Time spent with the patient was 50 minutes, with more  than 50% of the time spent in face-to-face education, counseling and care coordination.     Clayburn Pert, MD FACS General Surgeon 11/11/2016, 8:57 AM

## 2016-11-11 NOTE — ED Triage Notes (Addendum)
Pt to room 9 actively vomiting; st right sided CP, just below breast x "couple weeks"; began having vomiting at midnight

## 2016-11-12 ENCOUNTER — Inpatient Hospital Stay: Payer: PPO | Admitting: Anesthesiology

## 2016-11-12 ENCOUNTER — Encounter: Payer: Self-pay | Admitting: Anesthesiology

## 2016-11-12 ENCOUNTER — Inpatient Hospital Stay: Payer: PPO

## 2016-11-12 ENCOUNTER — Encounter
Admission: EM | Disposition: A | Discharge status: Home / Self Care | Payer: Self-pay | Source: Home / Self Care | Attending: General Surgery

## 2016-11-12 DIAGNOSIS — K8042 Calculus of bile duct with acute cholecystitis without obstruction: Secondary | ICD-10-CM | POA: Diagnosis not present

## 2016-11-12 DIAGNOSIS — I10 Essential (primary) hypertension: Secondary | ICD-10-CM | POA: Diagnosis not present

## 2016-11-12 DIAGNOSIS — K8010 Calculus of gallbladder with chronic cholecystitis without obstruction: Secondary | ICD-10-CM | POA: Diagnosis not present

## 2016-11-12 DIAGNOSIS — K810 Acute cholecystitis: Secondary | ICD-10-CM | POA: Diagnosis not present

## 2016-11-12 DIAGNOSIS — D649 Anemia, unspecified: Secondary | ICD-10-CM | POA: Diagnosis not present

## 2016-11-12 HISTORY — PX: CHOLECYSTECTOMY: SHX55

## 2016-11-12 LAB — CBC
HCT: 46.4 % (ref 40.0–52.0)
HEMOGLOBIN: 16 g/dL (ref 13.0–18.0)
MCH: 31.9 pg (ref 26.0–34.0)
MCHC: 34.4 g/dL (ref 32.0–36.0)
MCV: 92.7 fL (ref 80.0–100.0)
Platelets: 164 10*3/uL (ref 150–440)
RBC: 5.01 MIL/uL (ref 4.40–5.90)
RDW: 14.6 % — ABNORMAL HIGH (ref 11.5–14.5)
WBC: 6 10*3/uL (ref 3.8–10.6)

## 2016-11-12 LAB — COMPREHENSIVE METABOLIC PANEL
ALK PHOS: 33 U/L — AB (ref 38–126)
ALT: 23 U/L (ref 17–63)
AST: 29 U/L (ref 15–41)
Albumin: 3.4 g/dL — ABNORMAL LOW (ref 3.5–5.0)
Anion gap: 9 (ref 5–15)
BUN: 18 mg/dL (ref 6–20)
CALCIUM: 7.8 mg/dL — AB (ref 8.9–10.3)
CO2: 23 mmol/L (ref 22–32)
CREATININE: 1.48 mg/dL — AB (ref 0.61–1.24)
Chloride: 104 mmol/L (ref 101–111)
GFR calc non Af Amer: 47 mL/min — ABNORMAL LOW (ref >60)
GFR, EST AFRICAN AMERICAN: 54 mL/min — AB (ref >60)
GLUCOSE: 168 mg/dL — AB (ref 65–99)
Potassium: 3.6 mmol/L (ref 3.5–5.1)
Sodium: 136 mmol/L (ref 135–145)
Total Bilirubin: 1.1 mg/dL (ref 0.3–1.2)
Total Protein: 6.7 g/dL (ref 6.5–8.1)

## 2016-11-12 LAB — GLUCOSE, CAPILLARY
GLUCOSE-CAPILLARY: 170 mg/dL — AB (ref 65–99)
GLUCOSE-CAPILLARY: 228 mg/dL — AB (ref 65–99)
Glucose-Capillary: 131 mg/dL — ABNORMAL HIGH (ref 65–99)
Glucose-Capillary: 213 mg/dL — ABNORMAL HIGH (ref 65–99)
Glucose-Capillary: 225 mg/dL — ABNORMAL HIGH (ref 65–99)

## 2016-11-12 IMAGING — CR DG CHOLANGIOGRAM OPERATIVE
10 of 11 series · 15 of 54 positions shown · non-contrast
Comparison: Right upper quadrant ultrasound [DATE]

CLINICAL DATA: 70-year-old male with acute cholecystitis

EXAM:
INTRAOPERATIVE CHOLANGIOGRAM
TECHNIQUE: Cholangiographic images from the C-arm fluoroscopic device were
submitted for interpretation post-operatively. Please see the
procedural report for the amount of contrast and the fluoroscopy
time utilized.

[cont. (1 of 10)]
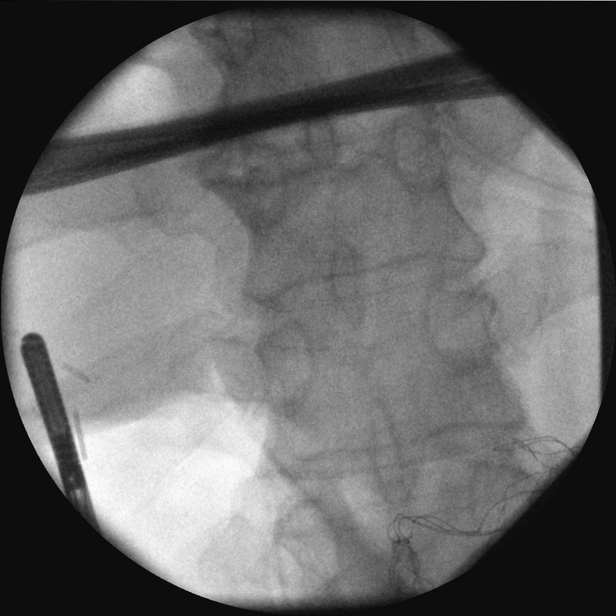

[Series 2: cont. · 2 of 63 frames shown (2 of 10)]
[frame 28/63]
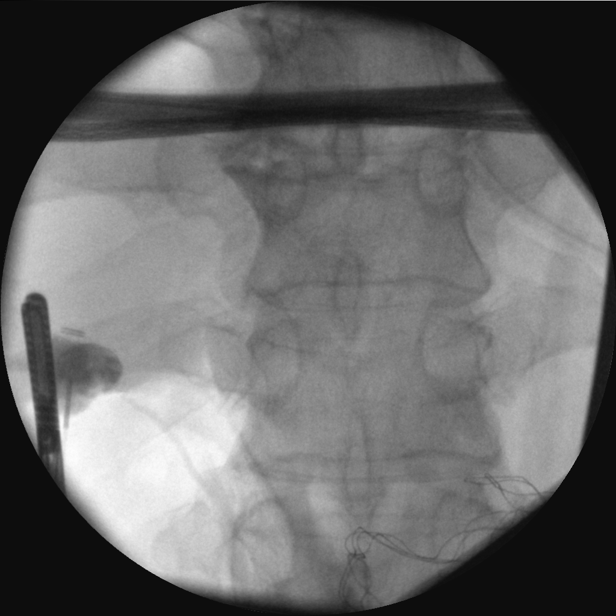
[frame 63/63]
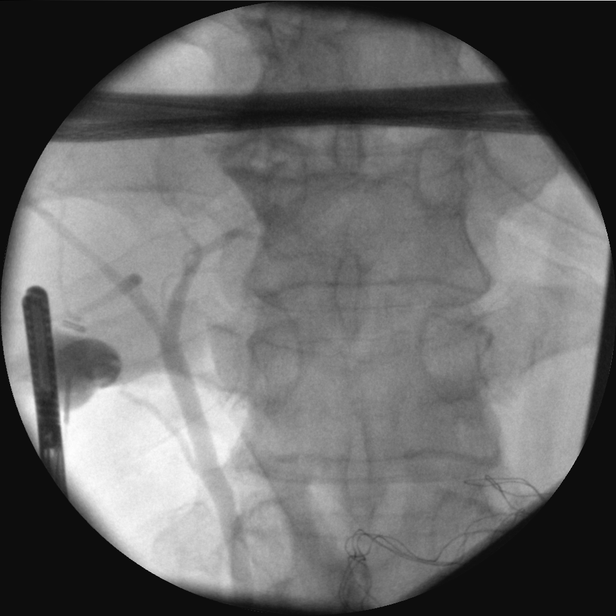

[Series 3: cont. · 2 of 16 frames shown (3 of 10)]
[frame 4/16]
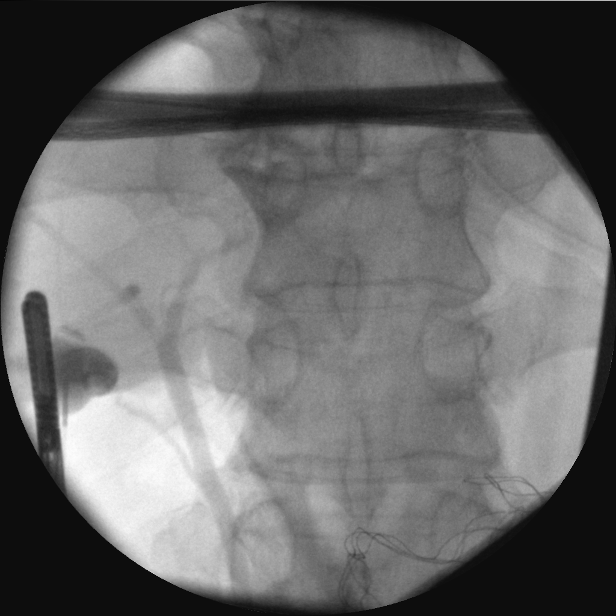
[frame 12/16]
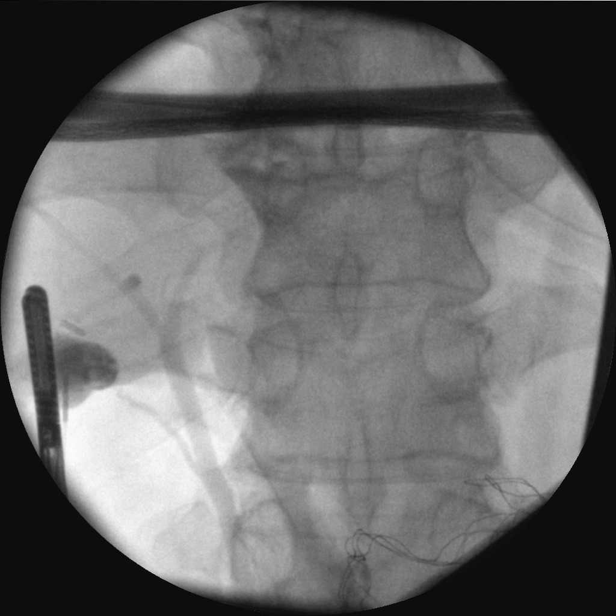

[cont. (4 of 10)]
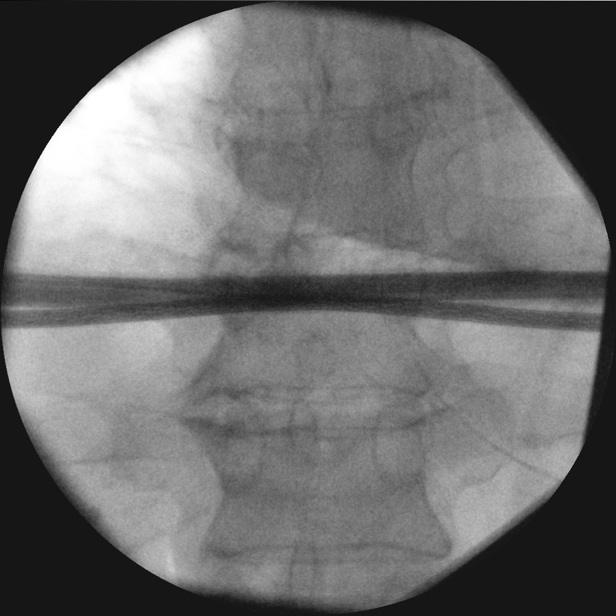

[cont. (5 of 10)]
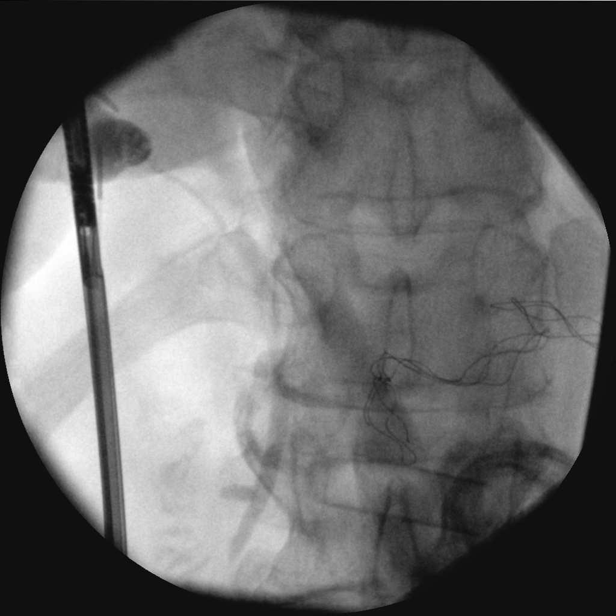

[Series 6: cont. · 2 of 50 frames shown (6 of 10)]
[frame 22/50]
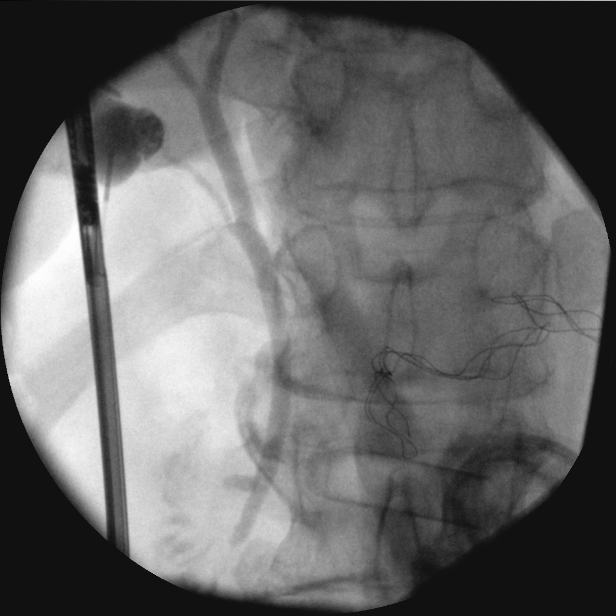
[frame 44/50]
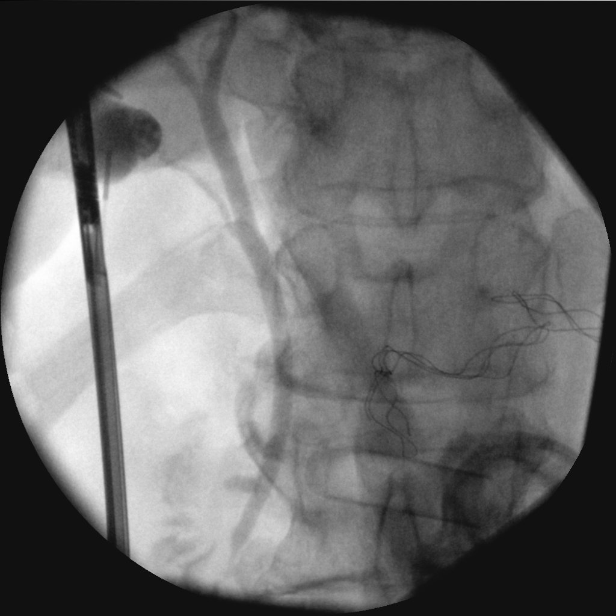

[cont. (7 of 10)]
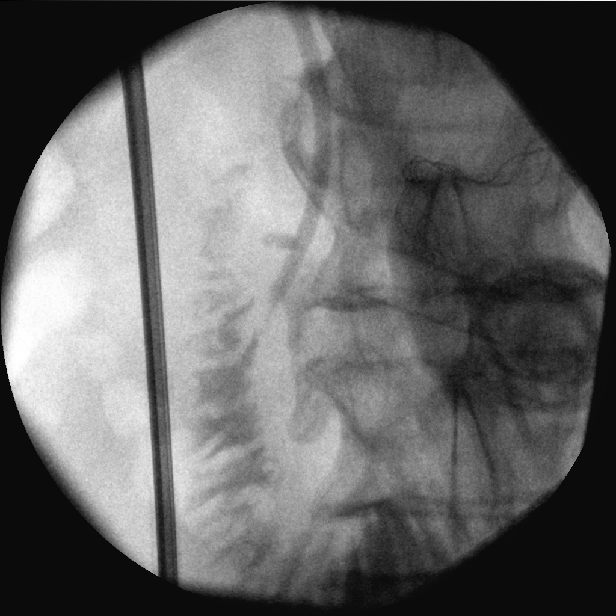

[Series 9: cont. · 2 of 21 frames shown (8 of 10)]
[frame 5/21]
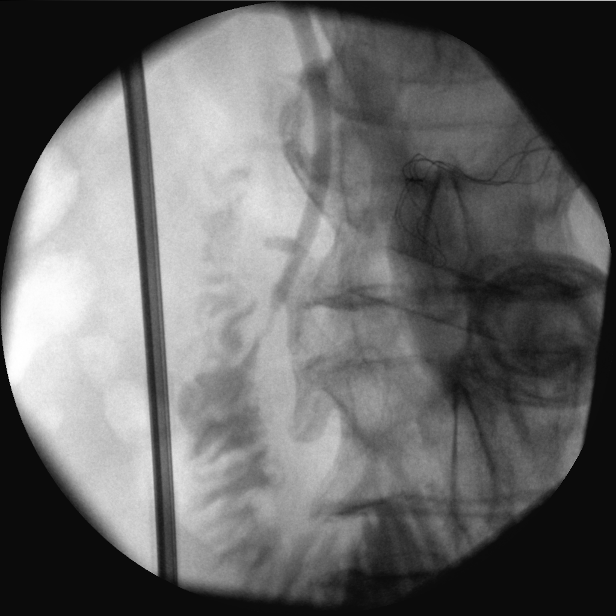
[frame 16/21]
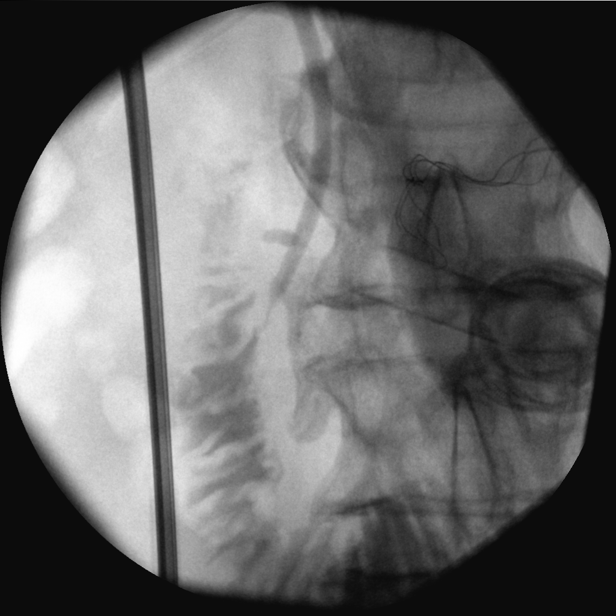

[cont. (9 of 10)]
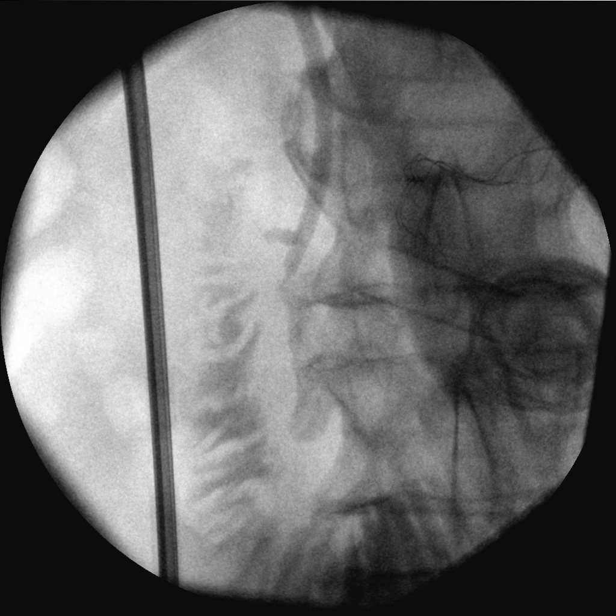

[Series 11: cont. · 2 of 83 frames shown (10 of 10)]
[frame 28/83]
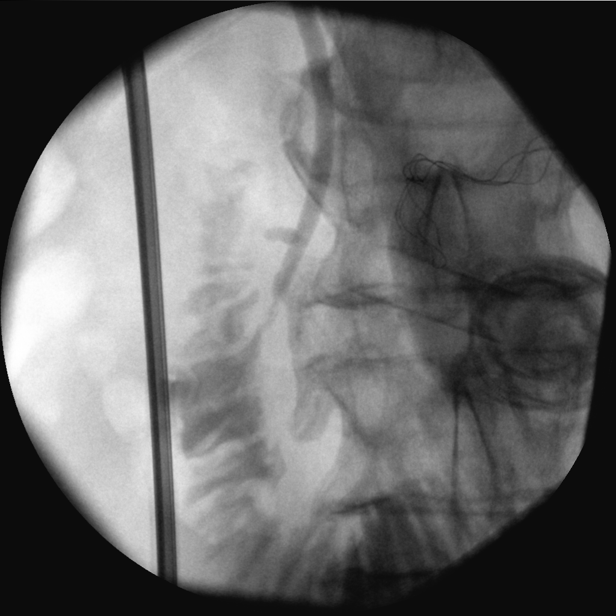
[frame 73/83]
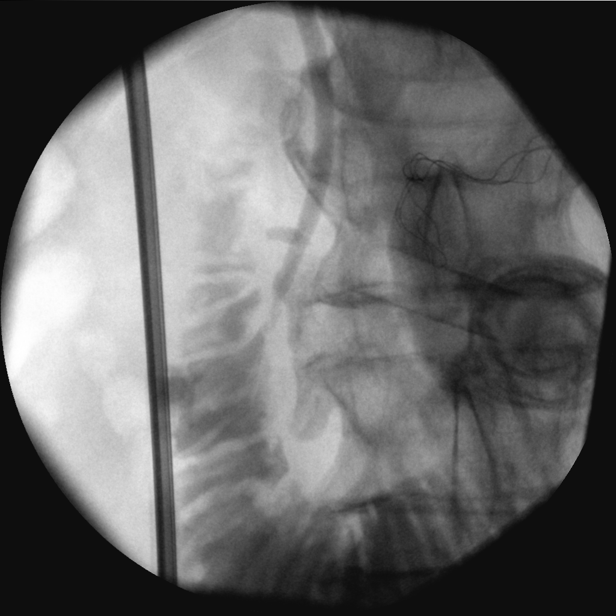

[15 of 54 positions shown; findings below may reference images not displayed]

FINDINGS: Numerous spot images in short cine clips in demonstrate cannulation
of the gallbladder fundus and intraoperative cholangiogram performed
at the time of laparoscopic cholecystectomy. There is no biliary
ductal dilatation, stenosis, stricture or evidence of
choledocholithiasis. Contrast material passes through the ampulla
and into the duodenum.
IMPRESSION: Negative intraoperative cholangiogram.

## 2016-11-12 SURGERY — LAPAROSCOPIC CHOLECYSTECTOMY WITH INTRAOPERATIVE CHOLANGIOGRAM
Anesthesia: General | Wound class: Clean Contaminated

## 2016-11-12 MED ORDER — ONDANSETRON HCL 4 MG/2ML IJ SOLN
INTRAMUSCULAR | Status: DC | PRN
  Administered 2016-11-12: 4 mg via INTRAVENOUS

## 2016-11-12 MED ORDER — ROCURONIUM BROMIDE 10 MG/ML (PF) SYRINGE
PREFILLED_SYRINGE | INTRAVENOUS | Status: AC
  Filled 2016-11-12: qty 10

## 2016-11-12 MED ORDER — ACETAMINOPHEN 10 MG/ML IV SOLN
INTRAVENOUS | Status: AC
  Filled 2016-11-12: qty 100

## 2016-11-12 MED ORDER — SUGAMMADEX SODIUM 200 MG/2ML IV SOLN
INTRAVENOUS | Status: AC
  Filled 2016-11-12: qty 2

## 2016-11-12 MED ORDER — FENTANYL CITRATE (PF) 100 MCG/2ML IJ SOLN: 25.0000 ug | INTRAMUSCULAR | Status: DC | PRN

## 2016-11-12 MED ORDER — FENTANYL CITRATE (PF) 100 MCG/2ML IJ SOLN
INTRAMUSCULAR | Status: AC
  Filled 2016-11-12: qty 2

## 2016-11-12 MED ORDER — FENTANYL CITRATE (PF) 100 MCG/2ML IJ SOLN
INTRAMUSCULAR | Status: AC
Start: 2016-11-12 — End: 2016-11-12
  Filled 2016-11-12: qty 2

## 2016-11-12 MED ORDER — KETOROLAC TROMETHAMINE 30 MG/ML IJ SOLN
INTRAMUSCULAR | Status: DC | PRN
  Administered 2016-11-12: 30 mg via INTRAVENOUS

## 2016-11-12 MED ORDER — LOSARTAN POTASSIUM 50 MG PO TABS
100.0000 mg | ORAL_TABLET | Freq: Every day | ORAL | Status: DC
  Administered 2016-11-12: 100 mg via ORAL
  Filled 2016-11-12: qty 2

## 2016-11-12 MED ORDER — PROPOFOL 10 MG/ML IV BOLUS
INTRAVENOUS | Status: DC | PRN
  Administered 2016-11-12: 180 mg via INTRAVENOUS

## 2016-11-12 MED ORDER — MIDAZOLAM HCL 2 MG/2ML IJ SOLN
INTRAMUSCULAR | Status: DC | PRN
  Administered 2016-11-12: 2 mg via INTRAVENOUS

## 2016-11-12 MED ORDER — DEXAMETHASONE SODIUM PHOSPHATE 10 MG/ML IJ SOLN
INTRAMUSCULAR | Status: AC
  Filled 2016-11-12: qty 1

## 2016-11-12 MED ORDER — LIDOCAINE 2% (20 MG/ML) 5 ML SYRINGE
INTRAMUSCULAR | Status: AC
  Filled 2016-11-12: qty 5

## 2016-11-12 MED ORDER — IOTHALAMATE MEGLUMINE 60 % INJ SOLN
INTRAMUSCULAR | Status: DC | PRN
  Administered 2016-11-12: 7.5 mL

## 2016-11-12 MED ORDER — PROPOFOL 10 MG/ML IV BOLUS
INTRAVENOUS | Status: AC
  Filled 2016-11-12: qty 20

## 2016-11-12 MED ORDER — LIDOCAINE HCL (CARDIAC) 20 MG/ML IV SOLN
INTRAVENOUS | Status: DC | PRN
  Administered 2016-11-12: 100 mg via INTRAVENOUS

## 2016-11-12 MED ORDER — FENTANYL CITRATE (PF) 100 MCG/2ML IJ SOLN
INTRAMUSCULAR | Status: DC | PRN
  Administered 2016-11-12: 50 ug via INTRAVENOUS
  Administered 2016-11-12: 25 ug via INTRAVENOUS
  Administered 2016-11-12: 50 ug via INTRAVENOUS
  Administered 2016-11-12: 25 ug via INTRAVENOUS
  Administered 2016-11-12: 100 ug via INTRAVENOUS

## 2016-11-12 MED ORDER — METOPROLOL SUCCINATE ER 25 MG PO TB24
25.0000 mg | ORAL_TABLET | Freq: Every day | ORAL | Status: DC
  Filled 2016-11-12: qty 1

## 2016-11-12 MED ORDER — SUGAMMADEX SODIUM 200 MG/2ML IV SOLN
INTRAVENOUS | Status: DC | PRN
  Administered 2016-11-12: 200 mg via INTRAVENOUS

## 2016-11-12 MED ORDER — HYDROCHLOROTHIAZIDE 12.5 MG PO CAPS
12.5000 mg | ORAL_CAPSULE | Freq: Every day | ORAL | Status: DC
  Filled 2016-11-12: qty 1

## 2016-11-12 MED ORDER — OXYCODONE HCL 5 MG/5ML PO SOLN: 5.0000 mg | Freq: Once | ORAL | Status: DC | PRN

## 2016-11-12 MED ORDER — LOSARTAN POTASSIUM-HCTZ 100-12.5 MG PO TABS: 1.0000 | ORAL_TABLET | Freq: Every day | ORAL | Status: DC

## 2016-11-12 MED ORDER — OXYCODONE-ACETAMINOPHEN 5-325 MG PO TABS: 1.0000 | ORAL_TABLET | ORAL | 0 refills | Status: DC | PRN

## 2016-11-12 MED ORDER — KETOROLAC TROMETHAMINE 30 MG/ML IJ SOLN
INTRAMUSCULAR | Status: AC
  Filled 2016-11-12: qty 1

## 2016-11-12 MED ORDER — OXYCODONE HCL 5 MG PO TABS: 5.0000 mg | ORAL_TABLET | Freq: Once | ORAL | Status: DC | PRN

## 2016-11-12 MED ORDER — ROCURONIUM BROMIDE 100 MG/10ML IV SOLN
INTRAVENOUS | Status: DC | PRN
  Administered 2016-11-12: 10 mg via INTRAVENOUS
  Administered 2016-11-12: 40 mg via INTRAVENOUS
  Administered 2016-11-12 (×2): 10 mg via INTRAVENOUS

## 2016-11-12 MED ORDER — PANTOPRAZOLE SODIUM 40 MG PO TBEC
40.0000 mg | DELAYED_RELEASE_TABLET | Freq: Every day | ORAL | Status: DC
  Administered 2016-11-12: 40 mg via ORAL
  Filled 2016-11-12: qty 1

## 2016-11-12 MED ORDER — OXYCODONE-ACETAMINOPHEN 5-325 MG PO TABS: 1.0000 | ORAL_TABLET | ORAL | Status: DC | PRN

## 2016-11-12 MED ORDER — LIDOCAINE HCL 1 % IJ SOLN
INTRAMUSCULAR | Status: DC | PRN
  Administered 2016-11-12: 29 mL

## 2016-11-12 MED ORDER — MIDAZOLAM HCL 2 MG/2ML IJ SOLN
INTRAMUSCULAR | Status: AC
Start: 2016-11-12 — End: 2016-11-12
  Filled 2016-11-12: qty 2

## 2016-11-12 MED ORDER — ACETAMINOPHEN 10 MG/ML IV SOLN
INTRAVENOUS | Status: DC | PRN
  Administered 2016-11-12: 1000 mg via INTRAVENOUS

## 2016-11-12 MED ORDER — ONDANSETRON HCL 4 MG/2ML IJ SOLN
INTRAMUSCULAR | Status: AC
  Filled 2016-11-12: qty 2

## 2016-11-12 SURGICAL SUPPLY — 48 items
ADHESIVE MASTISOL STRL (MISCELLANEOUS) ×3 IMPLANT
APPLICATOR COTTON TIP 6IN STRL (MISCELLANEOUS) ×6 IMPLANT
APPLIER CLIP ROT 10 11.4 M/L (STAPLE) ×3
BAG COUNTER SPONGE EZ (MISCELLANEOUS) ×2 IMPLANT
BLADE CLIPPER SURG (BLADE) ×3 IMPLANT
BLADE SURG SZ11 CARB STEEL (BLADE) ×3 IMPLANT
CANISTER SUCT 1200ML W/VALVE (MISCELLANEOUS) ×3 IMPLANT
CATH CHOLANG 76X19 KUMAR (CATHETERS) ×3 IMPLANT
CHLORAPREP W/TINT 26ML (MISCELLANEOUS) ×3 IMPLANT
CLIP APPLIE ROT 10 11.4 M/L (STAPLE) ×1 IMPLANT
CLOSURE WOUND 1/2 X4 (GAUZE/BANDAGES/DRESSINGS) ×1
CONRAY 60ML FOR OR (MISCELLANEOUS) ×3 IMPLANT
COUNTER SPONGE BAG EZ (MISCELLANEOUS) ×1
DRAPE SHEET LG 3/4 BI-LAMINATE (DRAPES) ×3 IMPLANT
DRESSING TELFA 4X3 1S ST N-ADH (GAUZE/BANDAGES/DRESSINGS) ×3 IMPLANT
DRSG TEGADERM 2-3/8X2-3/4 SM (GAUZE/BANDAGES/DRESSINGS) ×6 IMPLANT
DRSG TELFA 3X8 NADH (GAUZE/BANDAGES/DRESSINGS) ×3 IMPLANT
ELECT REM PT RETURN 9FT ADLT (ELECTROSURGICAL) ×3
ELECTRODE REM PT RTRN 9FT ADLT (ELECTROSURGICAL) ×1 IMPLANT
ENDOPOUCH RETRIEVER 10 (MISCELLANEOUS) ×3 IMPLANT
GLOVE BIO SURGEON STRL SZ7.5 (GLOVE) ×3 IMPLANT
GLOVE INDICATOR 8.0 STRL GRN (GLOVE) ×3 IMPLANT
GOWN STRL REUS W/ TWL LRG LVL3 (GOWN DISPOSABLE) ×3 IMPLANT
GOWN STRL REUS W/TWL LRG LVL3 (GOWN DISPOSABLE) ×6
GRASPER SUT TROCAR 14GX15 (MISCELLANEOUS) ×3 IMPLANT
HEMOSTAT SURGICEL 2X3 (HEMOSTASIS) ×3 IMPLANT
IRRIGATION STRYKERFLOW (MISCELLANEOUS) ×1 IMPLANT
IRRIGATOR STRYKERFLOW (MISCELLANEOUS) ×3
IV NS 1000ML (IV SOLUTION) ×2
IV NS 1000ML BAXH (IV SOLUTION) ×1 IMPLANT
L-HOOK LAP DISP 36CM (ELECTROSURGICAL) ×3
LABEL OR SOLS (LABEL) ×3 IMPLANT
LHOOK LAP DISP 36CM (ELECTROSURGICAL) ×1 IMPLANT
NEEDLE HYPO 25X1 1.5 SAFETY (NEEDLE) ×3 IMPLANT
NEEDLE VERESS 14GA 120MM (NEEDLE) ×3 IMPLANT
NS IRRIG 500ML POUR BTL (IV SOLUTION) ×3 IMPLANT
PACK LAP CHOLECYSTECTOMY (MISCELLANEOUS) ×3 IMPLANT
PENCIL ELECTRO HAND CTR (MISCELLANEOUS) ×3 IMPLANT
POUCH ENDO CATCH 10MM SPEC (MISCELLANEOUS) IMPLANT
SCISSORS METZENBAUM CVD 33 (INSTRUMENTS) ×3 IMPLANT
SLEEVE ENDOPATH XCEL 5M (ENDOMECHANICALS) ×9 IMPLANT
STRIP CLOSURE SKIN 1/2X4 (GAUZE/BANDAGES/DRESSINGS) ×2 IMPLANT
SUT MNCRL 4-0 (SUTURE) ×4
SUT MNCRL 4-0 27XMFL (SUTURE) ×2
SUTURE MNCRL 4-0 27XMF (SUTURE) ×2 IMPLANT
TROCAR XCEL 12X100 BLDLESS (ENDOMECHANICALS) ×3 IMPLANT
TROCAR XCEL NON-BLD 5MMX100MML (ENDOMECHANICALS) ×3 IMPLANT
TUBING INSUFFLATOR HI FLOW (MISCELLANEOUS) ×3 IMPLANT

## 2016-11-12 NOTE — Discharge Summary (Signed)
Patient ID: [name] MRN: [id number] DOB/AGE: 1946-02-04 70 y.o.  Admit date: 11/11/2016 Discharge date: 11/12/2016  Discharge Diagnoses:  Acute Cholecystitis  Procedures Performed: Laparoscopic Cholecystectomy  Discharged Condition: good  Hospital Course: Patient admitted from ER with acute cholecystitis. Taken to the OR for an uneventful cholecystectomy. Able to tolerate a diet and had pain controlled with oral medications post operatively and was discharged home.  Discharge Orders: Discharge Home  Disposition: HOME  Discharge Medications: Allergies as of 11/12/2016      Reactions   Sulfamethoxazole-trimethoprim    REACTION: Rash      Medication List    TAKE these medications   aspirin 81 MG tablet Take 81 mg by mouth daily.   insulin NPH-regular Human (70-30) 100 UNIT/ML injection Commonly known as:  NOVOLIN 70/30 140 units with breakfast and 50 units with supper, and syringes 3/day What changed:  how much to take  when to take this  additional instructions   NOVOLIN 70/30 RELION (70-30) 100 UNIT/ML injection Generic drug:  insulin NPH-regular Human INJECT 150 UNITS SUBCUTANEOUSLY WITH BREAKFAST AND 60 UNITS WITH SUPPER What changed:  Another medication with the same name was changed. Make sure you understand how and when to take each.   Insulin Pen Needle 31G X 5 MM Misc Use as directed 2 X daily. BD ULTRAPFINE MINI. Dx: 250.00   Lactulose 20 GM/30ML Soln Take 30 mLs (20 g total) by mouth 2 (two) times daily.   losartan-hydrochlorothiazide 100-12.5 MG tablet Commonly known as:  HYZAAR Take 1 tablet by mouth daily.   MAGNESIUM PO Take 1 tablet by mouth daily.   meloxicam 15 MG tablet Commonly known as:  MOBIC Take 1 tablet (15 mg total) by mouth daily.   metoprolol succinate 25 MG 24 hr tablet Commonly known as:  TOPROL-XL Take 1 tablet (25 mg total) by mouth daily.   omeprazole 40 MG capsule Commonly known as:  PRILOSEC TAKE ONE (1)  CAPSULE EACH DAY   ONE TOUCH ULTRA TEST test strip Generic drug:  glucose blood TWICE DAILY   onetouch ultrasoft lancets Use as instructed 2x daily Dx: 250.00   oxyCODONE-acetaminophen 5-325 MG tablet Commonly known as:  PERCOCET/ROXICET Take 1-2 tablets by mouth every 4 (four) hours as needed for moderate pain or severe pain.   PROSTATE SR PO Take by mouth.   simvastatin 40 MG tablet Commonly known as:  ZOCOR TAKE ONE TABLET BY MOUTH EVERY NIGHT AT BEDTIME   vitamin E 400 UNIT capsule Take 400 Units by mouth daily.        Follwup: Follow-up [place]. Go in 2 week(s).   Specialty:  General Surgery Why:  postop Appointment in [place] [facility] on 11/26/2016 at 10:15. Please arrive 15 minutes before your appointment Contact information: [address] [place] [phone]          Signed: Clayburn Pert 11/12/2016, 5:33 PM

## 2016-11-12 NOTE — Anesthesia Procedure Notes (Signed)
Procedure Name: Intubation Date/Time: 11/12/2016 7:32 AM Performed by: Hedda Slade Pre-anesthesia Checklist: Patient identified, Emergency Drugs available, Suction available and Patient being monitored Patient Re-evaluated:Patient Re-evaluated prior to inductionOxygen Delivery Method: Circle system utilized Preoxygenation: Pre-oxygenation with 100% oxygen Intubation Type: IV induction Ventilation: Mask ventilation without difficulty and Oral airway inserted - appropriate to patient size Grade View: Grade II Tube type: Oral Tube size: 7.5 mm Number of attempts: 1 Airway Equipment and Method: Stylet Placement Confirmation: ETT inserted through vocal cords under direct vision,  positive ETCO2 and breath sounds checked- equal and bilateral Secured at: 22 cm Tube secured with: Tape

## 2016-11-12 NOTE — Anesthesia Preprocedure Evaluation (Signed)
Anesthesia Evaluation  Patient identified by MRN, date of birth, ID band Patient awake    Reviewed: Allergy & Precautions, H&P , NPO status , Patient's Chart, lab work & pertinent test results  History of Anesthesia Complications Negative for: history of anesthetic complications  Airway Mallampati: III  TM Distance: <3 FB Neck ROM: full    Dental no notable dental hx. (+) Poor Dentition, Chipped   Pulmonary shortness of breath and with exertion, COPD,    Pulmonary exam normal breath sounds clear to auscultation       Cardiovascular Exercise Tolerance: Good hypertension, (-) angina(-) Past MI and (-) DOE Normal cardiovascular exam Rhythm:regular Rate:Normal     Neuro/Psych negative neurological ROS  negative psych ROS   GI/Hepatic negative GI ROS, Neg liver ROS, GERD  Controlled,  Endo/Other  diabetes, Type 2, Insulin Dependent  Renal/GU Renal disease     Musculoskeletal  (+) Arthritis ,   Abdominal   Peds  Hematology negative hematology ROS (+)   Anesthesia Other Findings Past Medical History: 06/2001: Adenomatous colon polyp 08/03/2007: ALLERGIC RHINITIS 08/03/2007: BENIGN PROSTATIC HYPERTROPHY 09/29/2008: 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 09/29/2008: DIABETIC RETINOPATHY, BACKGROUND 09/29/2008: DYSPHAGIA UNSPECIFIED 12/28/2008: ESOPHAGEAL STRICTURE 11/28/2008: GERD 08/03/2007: HYPERLIPIDEMIA 09/29/2008: OSTEOARTHRITIS, LUMBAR SPINE 01/07/2008: PAIN IN SOFT TISSUES OF LIMB 12/29/2007: UNSPECIFIED ANEMIA 01/07/2008: Unspecified essential hypertension 09/29/2008: URINARY CALCULUS  Past Surgical History: No date: LITHOTRIPSY No date: SHOULDER SURGERY     Comment: left, cyst and tumor removed  BMI    Body Mass Index:  31.00 kg/m      Reproductive/Obstetrics negative OB ROS                             Anesthesia Physical Anesthesia Plan  ASA: III  Anesthesia Plan: General ETT    Post-op Pain Management:    Induction:   Airway Management Planned:   Additional Equipment:   Intra-op Plan:   Post-operative Plan:   Informed Consent: I have reviewed the patients History and Physical, chart, labs and discussed the procedure including the risks, benefits and alternatives for the proposed anesthesia with the patient or authorized representative who has indicated his/her understanding and acceptance.   Dental Advisory Given  Plan Discussed with: Anesthesiologist, CRNA and Surgeon  Anesthesia Plan Comments:         Anesthesia Quick Evaluation

## 2016-11-12 NOTE — Progress Notes (Signed)
Pt to be discharged per MD order. Iv removed. Instructions reviewed with pt and family. All questions answered. Scripts given to pt. Taken down in wheelchair.

## 2016-11-12 NOTE — Progress Notes (Signed)
To OR via bed with Geanie Kenning, RN;Austyn Seier K, RN 6:53 AM 11/12/2016

## 2016-11-12 NOTE — Anesthesia Postprocedure Evaluation (Signed)
Anesthesia Post Note  Patient: [name]  Procedure(s) Performed: Procedure(s) (LRB): LAPAROSCOPIC CHOLECYSTECTOMY WITH INTRAOPERATIVE CHOLANGIOGRAM (N/A)  Patient location during evaluation: PACU Anesthesia Type: General Level of consciousness: awake and alert Pain management: pain level controlled Vital Signs Assessment: post-procedure vital signs reviewed and stable Respiratory status: spontaneous breathing, nonlabored ventilation, respiratory function stable and patient connected to nasal cannula oxygen Cardiovascular status: blood pressure returned to baseline and stable Postop Assessment: no signs of nausea or vomiting Anesthetic complications: no     Last Vitals:  Vitals:   11/12/16 0934 11/12/16 0949  BP: (!) 159/74 (!) 144/70  Pulse: (!) 121 (!) 105  Resp: 20 15  Temp:  (!) 38 C    Last Pain:  Vitals:   11/12/16 0934  TempSrc:   PainSc: Asleep                 Precious Haws [place]

## 2016-11-12 NOTE — Op Note (Signed)
Laparoscopic Cholecystectomy  Pre-operative Diagnosis: acute cholecystitis  Post-operative Diagnosis: Same  Procedure: Laparoscopic cholecystectomy with intraoperative cholangiogram  Surgeon: Juanda Crumble T. Adonis Huguenin, MD FACS  Anesthesia: Gen. with endotracheal tube  Assistant: None  Procedure Details  The patient was seen again in the Holding Room. The benefits, complications, treatment options, and expected outcomes were discussed with the patient. The risks of bleeding, infection, recurrence of symptoms, failure to resolve symptoms, bile duct damage, bile duct leak, retained common bile duct stone, bowel injury, any of which could require further surgery and/or ERCP, stent, or papillotomy were reviewed with the patient. The likelihood of improving the patient's symptoms with return to their baseline status is good.  The patient and/or family concurred with the proposed plan, giving informed consent.  The patient was taken to Operating Room, identified as [facility] and the procedure verified as Laparoscopic Cholecystectomy.  A Time Out was held and the above information confirmed.  Prior to the induction of general anesthesia, antibiotic prophylaxis was administered. VTE prophylaxis was in place. General endotracheal anesthesia was then administered and tolerated well. After the induction, the abdomen was prepped with Chloraprep and draped in the sterile fashion. The patient was positioned in the supine position.  Local anesthetic  was injected into the skin near the umbilicus and an incision made. The Veress needle was placed. Pneumoperitoneum was then created with CO2 and tolerated well without any adverse changes in the patient's vital signs. A 68mm port was placed in the periumbilical position and the abdominal cavity was explored.  Two 5-mm ports were placed in the right upper quadrant and a 12 mm epigastric port was placed all under direct vision. All skin incisions  were infiltrated with a  local anesthetic agent before making the incision and placing the trocars.   The patient was positioned  in reverse Trendelenburg, tilted slightly to the patient's left.  The gallbladder was identified, the fundus grasped and retracted cephalad. Adhesions were lysed bluntly. At this point the infundibulum was unable to be clearly visualized so a fifth trocar was placed in the midclavicular line at the level of the umbilicus in the same fashion as listed above. The 5 m trocar which allowed a peir retractor to be used to press the bowel down giving the visualization of the infundibulum. The infundibulum was grasped and retracted laterally, exposing the peritoneum overlying the triangle of Calot. This was then divided and exposed in a blunt fashion. A critical view of the cystic duct and cystic artery was obtained.  The cystic duct was clearly identified and bluntly dissected.   At this point a single clip was fired across the cystic artery at the level of the node of: No and a Kumar catheter and clamp was brought up for cholangio-gram. The mid infundibulum was grasped with a Kumar clamp and the catheter was inserted and the gallbladder. Back pressure returned bile and a C-arm was brought to the field for Charlesetta Garibaldi. The glandular Phillip Heal immediately showed the infundibulum, small but long cystic duct, common bile duct with brisk flow into the duodenum. It did reflux into the right and left hepatic ducts as well completing our cholangiogram. There were no obvious filling defects observed during the cholangiogram.  The gallbladder was taken from the gallbladder fossa in a retrograde fashion with the electrocautery. An area of pulsatile bleeding was encountered along the mid body of the gallbladder coming directly from the liver. This was difficult to control. Attempts to control it with clips failed,  Surgicel did not stop bleeding, directed electrocautery then was used that did Eventually control the bleeding.  The gallbladder was removed and placed in an Endocatch bag. The liver bed was irrigated and inspected. Hemostasis was achieved with the electrocautery. Copious irrigation was utilized and was repeatedly aspirated until clear.  The gallbladder and Endocatch sac were then removed through the epigastric port site.   Inspection of the right upper quadrant was performed. No bleeding, bile duct injury or leak, or bowel injury was noted. Pneumoperitoneum was released.  4-0 subcuticular Monocryl was used to close the skin. Steristrips and Mastisol and sterile dressings were  applied.  The patient was then extubated and brought to the recovery room in stable condition. Sponge, lap, and needle counts were correct at closure and at the conclusion of the case.   Findings: Acute Cholecystitis   Estimated Blood Loss: 100 mL         Drains: None         Specimens: Gallbladder           Complications: none               Jenissa Tyrell T. Adonis Huguenin, MD, FACS

## 2016-11-12 NOTE — Transfer of Care (Signed)
Immediate Anesthesia Transfer of Care Note  Patient: [name]  Procedure(s) Performed: Procedure(s): LAPAROSCOPIC CHOLECYSTECTOMY WITH INTRAOPERATIVE CHOLANGIOGRAM (N/A)  Patient Location: PACU  Anesthesia Type:General  Level of Consciousness: sedated  Airway & Oxygen Therapy: Patient Spontanous Breathing and Patient connected to face mask oxygen  Post-op Assessment: Report given to RN and Post -op Vital signs reviewed and stable  Post vital signs: Reviewed and stable  Last Vitals:  Vitals:   11/12/16 0445 11/12/16 0918  BP: (!) 141/72 (!) 169/80  Pulse: (!) 108 (!) 109  Resp: 18 18  Temp: 36.8 C 37.4 C    Last Pain:  Vitals:   11/12/16 0447  TempSrc:   PainSc: 0-No pain         Complications: No apparent anesthesia complications

## 2016-11-12 NOTE — Brief Op Note (Signed)
11/11/2016 - 11/12/2016  9:06 AM  PATIENT:  [name]  70 y.o. male  PRE-OPERATIVE DIAGNOSIS:  Acute cholecystitis with choledocholithiasis  POST-OPERATIVE DIAGNOSIS:  Acute cholecystitis with choledocholithiasis  PROCEDURE:  Procedure(s): LAPAROSCOPIC CHOLECYSTECTOMY WITH INTRAOPERATIVE CHOLANGIOGRAM (N/A)  SURGEON:  Surgeon(s) and Role:    * Clayburn Pert, MD - Primary  PHYSICIAN ASSISTANT:   ASSISTANTS: none   ANESTHESIA:   general  EBL:  Total I/O In: 700 [I.V.:700] Out: 100 [Blood:100]  BLOOD ADMINISTERED:none  DRAINS: none   LOCAL MEDICATIONS USED:  MARCAINE   , XYLOCAINE  and Amount: 29 ml  SPECIMEN:  Source of Specimen:  gallbladder  DISPOSITION OF SPECIMEN:  PATHOLOGY  COUNTS:  YES  TOURNIQUET:  * No tourniquets in log *  DICTATION: .Dragon Dictation  PLAN OF CARE: return to inpatient  PATIENT DISPOSITION:  PACU - hemodynamically stable.   Delay start of Pharmacological VTE agent (>24hrs) due to surgical blood loss or risk of bleeding: no

## 2016-11-12 NOTE — Discharge Instructions (Signed)
Laparoscopic Cholecystectomy, Care After °This sheet gives you information about how to care for yourself after your procedure. Your health care provider may also give you more specific instructions. If you have problems or questions, contact your health care provider. °What can I expect after the procedure? °After the procedure, it is common to have: °· Pain at your incision sites. You will be given medicines to control this pain. °· Mild nausea or vomiting. °· Bloating and possible shoulder pain from the air-like gas that was used during the procedure. °Follow these instructions at home: °Incision care  ° °· Follow instructions from your health care provider about how to take care of your incisions. Make sure you: °¨ Wash your hands with soap and water before you change your bandage (dressing). If soap and water are not available, use hand sanitizer. °¨ Change your dressing as told by your health care provider. °¨ Leave stitches (sutures), skin glue, or adhesive strips in place. These skin closures may need to be in place for 2 weeks or longer. If adhesive strip edges start to loosen and curl up, you may trim the loose edges. Do not remove adhesive strips completely unless your health care provider tells you to do that. °· Do not take baths, swim, or use a hot tub until your health care provider approves. Ask your health care provider if you can take showers. You may only be allowed to take sponge baths for bathing. °· Check your incision area every day for signs of infection. Check for: °¨ More redness, swelling, or pain. °¨ More fluid or blood. °¨ Warmth. °¨ Pus or a bad smell. °Activity  °· Do not drive or use heavy machinery while taking prescription pain medicine. °· Do not lift anything that is heavier than 10 lb (4.5 kg) until your health care provider approves. °· Do not play contact sports until your health care provider approves. °· Do not drive for 24 hours if you were given a medicine to help you relax  (sedative). °· Rest as needed. Do not return to work or school until your health care provider approves. °General instructions  °· Take over-the-counter and prescription medicines only as told by your health care provider. °· To prevent or treat constipation while you are taking prescription pain medicine, your health care provider may recommend that you: °¨ Drink enough fluid to keep your urine clear or pale yellow. °¨ Take over-the-counter or prescription medicines. °¨ Eat foods that are high in fiber, such as fresh fruits and vegetables, whole grains, and beans. °¨ Limit foods that are high in fat and processed sugars, such as fried and sweet foods. °Contact a health care provider if: °· You develop a rash. °· You have more redness, swelling, or pain around your incisions. °· You have more fluid or blood coming from your incisions. °· Your incisions feel warm to the touch. °· You have pus or a bad smell coming from your incisions. °· You have a fever. °· One or more of your incisions breaks open. °Get help right away if: °· You have trouble breathing. °· You have chest pain. °· You have increasing pain in your shoulders. °· You faint or feel dizzy when you stand. °· You have severe pain in your abdomen. °· You have nausea or vomiting that lasts for more than one day. °· You have leg pain. °This information is not intended to replace advice given to you by your health care provider. Make sure you discuss any   questions you have with your health care provider. °Document Released: 11/10/2005 Document Revised: 05/31/2016 Document Reviewed: 04/28/2016 °Elsevier Interactive Patient Education © 2017 [employer] ° °

## 2016-11-13 LAB — SURGICAL PATHOLOGY

## 2016-11-24 HISTORY — PX: URINARY SURGERY: SHX2626

## 2016-11-26 ENCOUNTER — Encounter: Payer: Self-pay | Admitting: Surgery

## 2016-11-26 ENCOUNTER — Ambulatory Visit (INDEPENDENT_AMBULATORY_CARE_PROVIDER_SITE_OTHER): Payer: PPO | Admitting: Surgery

## 2016-11-26 VITALS — BP 154/72 | HR 105 | Temp 98.1°F | Ht 72.0 in | Wt 230.2 lb

## 2016-11-26 DIAGNOSIS — K8000 Calculus of gallbladder with acute cholecystitis without obstruction: Secondary | ICD-10-CM

## 2016-11-26 NOTE — Progress Notes (Signed)
Outpatient postop visit  11/26/2016  [name] is an 71 y.o. male.    Procedure: Laparoscopic cholecystectomy with grams  CC:min drainage  HPI: This a patient underwent a laparoscopic cholecystectomy with cholangiography by Dr. Adonis Huguenin before Christmas. He is here for a postoperative check. His pathology was reviewed.  Patient states that he had not noticed any drainage from his umbilical wound until this morning. Otherwise he is eating well he's having some constipation problems with his common for him.  Medications reviewed.    Physical Exam:  There were no vitals taken for this visit.    PE: Minimal serosanguineous drainage from the 5 mm supraumbilical port site. The port site was opened with a Q-tip and a small hematoma was noted. No purulence. No erythema. Dry dressing placed.  No icterus no jaundice soft nontender abdomen    Assessment/Plan:  This patient underwent a laparoscopic cholecystectomy with cholangiography for choledocholithiasis. His pathology has been reviewed. Currently he is experiencing a very minimal amount of drainage from the 5 mm umbilical wound and I see no sign of infection there seems to be a resorbing hematoma beneath it. I discussed with him the use of a dry dressing I would not recommend opening it any further and placing packing at this point as it is not infected. I reminded him of the signs of infection and to return to see [place] sooner if he notices any of those things otherwise we can see him next week for follow-up. He understood and agreed to proceed  Florene Glen, MD, FACS

## 2016-11-26 NOTE — Patient Instructions (Signed)
Please keep a dressing over the incision area as long as there is drainage. Please see your follow up appointment listed below.

## 2016-12-03 ENCOUNTER — Encounter: Payer: Self-pay | Admitting: General Surgery

## 2016-12-03 ENCOUNTER — Ambulatory Visit (INDEPENDENT_AMBULATORY_CARE_PROVIDER_SITE_OTHER): Payer: PPO | Admitting: General Surgery

## 2016-12-03 ENCOUNTER — Other Ambulatory Visit: Payer: Self-pay | Admitting: Endocrinology

## 2016-12-03 VITALS — BP 128/77 | HR 102 | Temp 98.7°F | Ht 72.0 in | Wt 228.0 lb

## 2016-12-03 DIAGNOSIS — Z4889 Encounter for other specified surgical aftercare: Secondary | ICD-10-CM

## 2016-12-03 NOTE — Patient Instructions (Signed)
Please call our office with any questions or concerns.  Please do not submerge in a tub, hot tub, or pool until incisions are completely sealed.  Use sun block to incision area over the next year if this area will be exposed to sun. This helps decrease scarring.  You may resume your normal activities on 12/23/16. At that time- Listen to your body when lifting, if you have pain when lifting, stop and then try again in a few days. Pain after doing exercises or activities of daily living is normal as you get back in to your normal routine.  If you develop redness, drainage, or pain at incision sites- call our office immediately and speak with a nurse.   If you are still having right sided chest discomfort. We feel it is most likely some inflammation from your surgery. If you continue to have this in 2-3 more weeks, call our office so that we can facilitate further work-up.

## 2016-12-03 NOTE — Progress Notes (Signed)
Outpatient Surgical Follow Up  12/03/2016  [name] is an 71 y.o. male.   Chief Complaint  Patient presents with  . Routine Post Op    Laparoscopic Cholecystectomy (11/11/16)-Dr. Adonis Huguenin    HPI: 71 year old male returns to clinic 3 weeks status post laparoscopic cholecystectomy. At his last visit he had a hematoma evacuated from his umbilical incision site. Patient reports rapid improvement since that time. Denies any pain, fevers, chills, nausea vomiting, diarrhea, constipation. He states his incision sites are much improved and he has not had any additional nausea or vomiting since the time of surgery. He continues to have an atypical right chest wall discomfort. It is not associated with eating but is associated with activity.  Past Medical History:  Diagnosis Date  . Adenomatous colon polyp 06/2001  . ALLERGIC RHINITIS 08/03/2007  . BENIGN PROSTATIC HYPERTROPHY 08/03/2007  . 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 DIABETIC RETINOPATHY, BACKGROUND 09/29/2008  . DYSPHAGIA UNSPECIFIED 09/29/2008  . 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 GERD 11/28/2008  . HYPERLIPIDEMIA 08/03/2007  . 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 PAIN IN SOFT TISSUES OF LIMB 01/07/2008  . UNSPECIFIED ANEMIA 12/29/2007  . Unspecified essential hypertension 01/07/2008  . URINARY CALCULUS 09/29/2008    Past Surgical History:  Procedure Laterality Date  . CHOLECYSTECTOMY N/A 11/12/2016   Procedure: LAPAROSCOPIC CHOLECYSTECTOMY WITH INTRAOPERATIVE CHOLANGIOGRAM;  Surgeon: Clayburn Pert, MD;  Location: [facility] ORS;  Service: General;  Laterality: N/A;  . LITHOTRIPSY    . SHOULDER SURGERY     left, cyst and tumor removed    Family History  Problem Relation Age of Onset  . Diabetes Mother   . Prostate cancer Brother   . Diabetes Brother     x 3 brother  . Heart disease Brother     Social History:  reports that he has never smoked. He has never used smokeless tobacco. He reports that he drinks  alcohol. He reports that he does not use drugs.  Allergies:  Allergies  Allergen Reactions  . Sulfamethoxazole-Trimethoprim Rash    Medications reviewed.    ROS A multipoint review of systems was completed. All pertinent positives and negatives are documented within the history of present illness the remainder are negative.   BP 128/77   Pulse (!) 102   Temp 98.7 F (37.1 C) (Oral)   Ht 6' (1.829 m)   Wt 103.4 kg (228 lb)   BMI 30.92 kg/m   Physical Exam Gen.: No acute distress Chest: Clear to auscultation, nontender Heart: Regular rhythm Abdomen: Soft, nontender, nondistended. Well approximated laparoscopic incision sites that any evidence of erythema or drainage. There are scabs present at all the incision sites.    No results found for this or any previous visit (from the past 48 hour(s)). No results found.  Assessment/Plan:  1. Aftercare following surgery 71 year old male status post laparoscopic cholecystectomy. Doing very well. Provided with standard postoperative precautions including return to activities as well as signs of infection. He'll follow-up in clinic for these on an as-needed basis. Discussed that if his chest discomfort continues after his abdomen is fully healed 2 weeks. He is to follow up with clinic for further evaluation. This is likely unrelated to his gallbladder if it is still continuing.     Clayburn Pert, MD FACS General Surgeon  12/03/2016,10:04 AM

## 2016-12-09 ENCOUNTER — Ambulatory Visit: Payer: PPO | Admitting: Endocrinology

## 2017-01-23 ENCOUNTER — Ambulatory Visit (INDEPENDENT_AMBULATORY_CARE_PROVIDER_SITE_OTHER): Payer: PPO | Admitting: Endocrinology

## 2017-01-23 ENCOUNTER — Encounter: Payer: Self-pay | Admitting: Endocrinology

## 2017-01-23 VITALS — BP 146/84 | HR 75 | Ht 72.0 in | Wt 234.0 lb

## 2017-01-23 DIAGNOSIS — Z794 Long term (current) use of insulin: Secondary | ICD-10-CM | POA: Diagnosis not present

## 2017-01-23 DIAGNOSIS — I10 Essential (primary) hypertension: Secondary | ICD-10-CM | POA: Diagnosis not present

## 2017-01-23 DIAGNOSIS — B351 Tinea unguium: Secondary | ICD-10-CM | POA: Diagnosis not present

## 2017-01-23 DIAGNOSIS — R609 Edema, unspecified: Secondary | ICD-10-CM

## 2017-01-23 DIAGNOSIS — E11319 Type 2 diabetes mellitus with unspecified diabetic retinopathy without macular edema: Secondary | ICD-10-CM | POA: Diagnosis not present

## 2017-01-23 DIAGNOSIS — Z23 Encounter for immunization: Secondary | ICD-10-CM

## 2017-01-23 LAB — POCT GLYCOSYLATED HEMOGLOBIN (HGB A1C): HEMOGLOBIN A1C: 7.6

## 2017-01-23 NOTE — Progress Notes (Signed)
we discussed code status.  pt requests full code, but would not want to be started or maintained on artificial life-support measures if there was not a reasonable chance of recovery 

## 2017-01-23 NOTE — Patient Instructions (Addendum)
Please continue the same insulin. On this type of insulin schedule, you should eat meals on a regular schedule.  If a meal is missed or significantly delayed, your blood sugar could go low.  check your blood sugar twice a day.  vary the time of day when you check, between before the 3 meals, and at bedtime.  also check if you have symptoms of your blood sugar being too high or too low.  please keep a record of the readings and bring it to your next appointment here.  You can write it on any piece of paper.  please call us sooner if your blood sugar goes below 70, or if you have a lot of readings over 200.  good diet and exercise significantly improve the control of your diabetes.  please let me know if you wish to be referred to a dietician.  high blood sugar is very risky to your health.  you should see an eye doctor and dentist every year.  It is very important to get all recommended vaccinations.  Please consider these measures for your health:  minimize alcohol.  Do not use tobacco products.  Have a colonoscopy at least every 10 years from age 75.  Keep firearms safely stored.  Always use seat belts.  have working smoke alarms in your home.  See an eye doctor and dentist regularly.  Never drive under the influence of alcohol or drugs (including prescription drugs).  Those with fair skin should take precautions against the sun, and should carefully examine their skin once per month, for any new or changed moles. It is critically important to prevent falling down (keep floor areas well-lit, dry, and free of loose objects.  If you have a cane, walker, or wheelchair, you should use it, even for short trips around the house.  Wear flat-soled shoes.  Also, try not to rush).   Please come back for a follow-up appointment in 4 months.

## 2017-01-23 NOTE — Progress Notes (Signed)
Subjective:    Patient ID: [name], male    DOB: October 08, 1946, 71 y.o.   MRN: [id number]  HPI Pt returns for f/u of diabetes mellitus: DM type: Insulin-requiring type 2 Dx'ed: [id number] Complications: nephropathy and retinopathy.  Therapy: insulin since soon after dx.   DKA: never Severe hypoglycemia: never.   Pancreatitis: never Other: he is on a BID insulin schedule, due to noncompliance; he takes human insulin, due to cost.  He is retired.  Interval history: no cbg record, but states cbg's are well-controlled.  There is no trend throughout the day.  He seldom has hypoglycemia, and these episodes are mild.   Past Medical History:  Diagnosis Date  . Adenomatous colon polyp 06/2001  . ALLERGIC RHINITIS 08/03/2007  . BENIGN PROSTATIC HYPERTROPHY 08/03/2007  . 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 DIABETIC RETINOPATHY, BACKGROUND 09/29/2008  . DYSPHAGIA UNSPECIFIED 09/29/2008  . 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 GERD 11/28/2008  . HYPERLIPIDEMIA 08/03/2007  . 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 PAIN IN SOFT TISSUES OF LIMB 01/07/2008  . UNSPECIFIED ANEMIA 12/29/2007  . Unspecified essential hypertension 01/07/2008  . URINARY CALCULUS 09/29/2008    Past Surgical History:  Procedure Laterality Date  . CHOLECYSTECTOMY N/A 11/12/2016   Procedure: LAPAROSCOPIC CHOLECYSTECTOMY WITH INTRAOPERATIVE CHOLANGIOGRAM;  Surgeon: Clayburn Pert, MD;  Location: [facility] ORS;  Service: General;  Laterality: N/A;  . LITHOTRIPSY    . SHOULDER SURGERY     left, cyst and tumor removed    Social History   Social History  . Marital status: Married    Spouse name: N/A  . Number of children: 2  . Years of education: N/A   Occupational History  . [employer]   Social History Main Topics  . Smoking status: Never Smoker  . Smokeless tobacco: Never Used  . Alcohol use Yes     Comment: occasional  . Drug use: No  . Sexual activity: Not on file   Other Topics Concern   . Not on file   Social History Narrative   Daily Caffeine Use 1-2 daily    Current Outpatient Prescriptions on File Prior to Visit  Medication Sig Dispense Refill  . aspirin 81 MG tablet Take 81 mg by mouth daily.    . insulin NPH-regular Human (NOVOLIN 70/30) (70-30) 100 UNIT/ML injection 140 units with breakfast and 50 units with supper, and syringes 3/day (Patient taking differently: 50-150 Units 2 (two) times daily with a meal. 140 units with breakfast and 50 units with supper, and syringes 3/day) 70 mL 11  . Insulin Pen Needle 31G X 5 MM MISC Use as directed 2 X daily. BD ULTRAPFINE MINI. Dx: 250.00 100 each 5  . lactulose (CHRONULAC) 10 GM/15ML solution TAKE 30ML  ( 2 TABLESPOONSFUL) BY MOUTH TWICE A DAY 1892 mL 11  . Lancets (ONETOUCH ULTRASOFT) lancets Use as instructed 2x daily Dx: 250.00 100 each 5  . losartan-hydrochlorothiazide (HYZAAR) 100-12.5 MG tablet Take 1 tablet by mouth daily. 30 tablet 11  . MAGNESIUM PO Take 1 tablet by mouth daily.     . meloxicam (MOBIC) 15 MG tablet Take 1 tablet (15 mg total) by mouth daily. 30 tablet 0  . metoprolol succinate (TOPROL-XL) 25 MG 24 hr tablet Take 1 tablet (25 mg total) by mouth daily. 90 tablet 3  . NOVOLIN 70/30 RELION (70-30) 100 UNIT/ML injection INJECT 150 UNITS SUBCUTANEOUSLY WITH BREAKFAST AND 60 UNITS WITH SUPPER 70 mL 13  . omeprazole (  PRILOSEC) 40 MG capsule TAKE ONE (1) CAPSULE EACH DAY 30 capsule 11  . ONE TOUCH ULTRA TEST test strip TWICE DAILY 100 each 11  . Saw Palmetto-Phytosterols (PROSTATE SR PO) Take by mouth.    . simvastatin (ZOCOR) 40 MG tablet TAKE ONE TABLET BY MOUTH EVERY NIGHT AT BEDTIME 90 tablet 3  . vitamin E 400 UNIT capsule Take 400 Units by mouth daily.     No current facility-administered medications on file prior to visit.     Allergies  Allergen Reactions  . Sulfamethoxazole-Trimethoprim Rash    Family History  Problem Relation Age of Onset  . Diabetes Mother   . Prostate cancer Brother     . Diabetes Brother     x 3 brother  . Heart disease Brother     BP (!) 146/84   Pulse 75   Ht 6' (1.829 m)   Wt 234 lb (106.1 kg)   SpO2 96%   BMI 31.74 kg/m    Review of Systems Denies LOC.      Objective:   Physical Exam VITAL SIGNS:  See vs page GENERAL: no distress Pulses: dorsalis pedis intact bilat.   MSK: no deformity of the feet CV: 1+ bilat leg edema Skin:  no ulcer on the feet.  normal color and temp on the feet. Neuro: sensation is intact to touch on the feet.  Ext: There is bilateral onychomycosis of the toenails.   A1c=7.6%    Assessment & Plan:  Insulin-requiring type 2 DM, with retinopathy: this is the best control this pt should aim for, given this regimen, which does match insulin to his changing needs throughout the day HTN: we'll follow for now, on same rx.  Renal failure: this limits HTN rx options.   Edema: this limits HTN rx options.   Subjective:   Patient here for Medicare annual wellness visit and management of other chronic and acute problems.     Risk factors: advanced age    55 of Physicians Providing Medical Care to Patient:  See "snapshot"   Activities of Daily Living: In your present state of health, do you have any difficulty performing the following activities (lives with wife)?:  Preparing food and eating?: No  Bathing yourself: No  Getting dressed: No  Using the toilet:No  Moving around from place to place: No  In the past year have you fallen or had a near fall?:No    Home Safety: Has smoke detector and wears seat belts. Firearms are safely stored. No excess sun exposure.   Diet and Exercise  Current exercise habits: pt says fair Dietary issues discussed: pt reports a healthy diet.   Depression Screen  Q1: Over the past two weeks, have you felt down, depressed or hopeless?no  Q2: Over the past two weeks, have you felt little interest or pleasure in doing things? no   The following portions of the patient's  history were reviewed and updated as appropriate: allergies, current medications, past family history, past medical history, past social history, past surgical history and problem list.   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  no visual loss Objective:   Vision:  [employer], but does not recall name.  Hearing: grossly normal.   Body mass index:  See vs page.  Msk: pt easily and quickly performs "get-up-and-go" from a sitting position.  Cognitive Impairment Assessment: cognition, memory and judgment appear normal.  remembers 2/3 at 5 minutes (? effort).  excellent recall.  can easily read and write a sentence.  alert and oriented x 3.     Assessment:   Medicare wellness utd on preventive parameters    Plan:   During the course of the visit the patient was educated and counseled about appropriate screening and preventive services including:        Fall prevention   Diabetes screening  Nutrition counseling   Vaccines / LABS Prevnar is given today   Patient Instructions (the written plan) was given to the patient.

## 2017-02-10 ENCOUNTER — Other Ambulatory Visit: Payer: Self-pay

## 2017-02-10 MED ORDER — METOPROLOL SUCCINATE ER 25 MG PO TB24: 25.0000 mg | ORAL_TABLET | Freq: Every day | ORAL | 3 refills | Status: DC

## 2017-04-10 ENCOUNTER — Other Ambulatory Visit: Payer: Self-pay | Admitting: Endocrinology

## 2017-05-13 DIAGNOSIS — R3914 Feeling of incomplete bladder emptying: Secondary | ICD-10-CM | POA: Diagnosis not present

## 2017-05-13 DIAGNOSIS — N486 Induration penis plastica: Secondary | ICD-10-CM | POA: Diagnosis not present

## 2017-05-13 DIAGNOSIS — N401 Enlarged prostate with lower urinary tract symptoms: Secondary | ICD-10-CM | POA: Diagnosis not present

## 2017-05-13 DIAGNOSIS — R3915 Urgency of urination: Secondary | ICD-10-CM | POA: Diagnosis not present

## 2017-05-13 DIAGNOSIS — R351 Nocturia: Secondary | ICD-10-CM | POA: Diagnosis not present

## 2017-05-13 DIAGNOSIS — R3916 Straining to void: Secondary | ICD-10-CM | POA: Diagnosis not present

## 2017-05-25 ENCOUNTER — Encounter: Payer: Self-pay | Admitting: Endocrinology

## 2017-05-25 ENCOUNTER — Ambulatory Visit (INDEPENDENT_AMBULATORY_CARE_PROVIDER_SITE_OTHER): Payer: PPO | Admitting: Endocrinology

## 2017-05-25 VITALS — BP 130/87 | HR 67 | Ht 72.0 in | Wt 225.0 lb

## 2017-05-25 DIAGNOSIS — Z794 Long term (current) use of insulin: Secondary | ICD-10-CM | POA: Diagnosis not present

## 2017-05-25 DIAGNOSIS — E11319 Type 2 diabetes mellitus with unspecified diabetic retinopathy without macular edema: Secondary | ICD-10-CM | POA: Diagnosis not present

## 2017-05-25 LAB — POCT GLYCOSYLATED HEMOGLOBIN (HGB A1C): Hemoglobin A1C: 8

## 2017-05-25 MED ORDER — INSULIN NPH ISOPHANE & REGULAR (70-30) 100 UNIT/ML ~~LOC~~ SUSP: SUBCUTANEOUS | 13 refills | Status: DC

## 2017-05-25 NOTE — Patient Instructions (Addendum)
Please increase the insulin to 170 units with breakfast, and 60 units with supper.  For your safety, it is important to take this before meals.   On this type of insulin schedule, you should eat meals on a regular schedule.  If a meal is missed or significantly delayed, your blood sugar could go low.   check your blood sugar twice a day.  vary the time of day when you check, between before the 3 meals, and at bedtime.  also check if you have symptoms of your blood sugar being too high or too low.  please keep a record of the readings and bring it to your next appointment here.  You can write it on any piece of paper.  please call us sooner if your blood sugar goes below 70, or if you have a lot of readings over 200.   Please come back for a regular physical appointment in 3 months.

## 2017-05-25 NOTE — Progress Notes (Signed)
Subjective:    Patient ID: [name], male    DOB: 1945-12-16, 71 y.o.   MRN: [id number]  HPI Pt returns for f/u of diabetes mellitus: DM type: Insulin-requiring type 2 Dx'ed: 5916 Complications: nephropathy and retinopathy.  Therapy: insulin since soon after dx.   DKA: never Severe hypoglycemia: never.   Pancreatitis: never Other: he is on a BID insulin schedule, due to noncompliance; he takes human insulin, due to cost.  He is retired.  Interval history: no cbg record, but states cbg's are well-controlled.  It is in general higher as the day goes on.  He denies hypoglycemia.  He says he sometimes takes insulin 1-2 hrs after eating.  He also sometimes skips meals.  Past Medical History:  Diagnosis Date  . Adenomatous colon polyp 06/2001  . ALLERGIC RHINITIS 08/03/2007  . BENIGN PROSTATIC HYPERTROPHY 08/03/2007  . 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 DIABETIC RETINOPATHY, BACKGROUND 09/29/2008  . DYSPHAGIA UNSPECIFIED 09/29/2008  . 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 GERD 11/28/2008  . HYPERLIPIDEMIA 08/03/2007  . 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 PAIN IN SOFT TISSUES OF LIMB 01/07/2008  . UNSPECIFIED ANEMIA 12/29/2007  . Unspecified essential hypertension 01/07/2008  . URINARY CALCULUS 09/29/2008    Past Surgical History:  Procedure Laterality Date  . CHOLECYSTECTOMY N/A 11/12/2016   Procedure: LAPAROSCOPIC CHOLECYSTECTOMY WITH INTRAOPERATIVE CHOLANGIOGRAM;  Surgeon: Clayburn Pert, MD;  Location: [facility] ORS;  Service: General;  Laterality: N/A;  . LITHOTRIPSY    . SHOULDER SURGERY     left, cyst and tumor removed    Social History   Social History  . Marital status: Married    Spouse name: N/A  . Number of children: 2  . Years of education: N/A   Occupational History  . [employer]   Social History Main Topics  . Smoking status: Never Smoker  . Smokeless tobacco: Never Used  . Alcohol use Yes     Comment: occasional  . Drug  use: No  . Sexual activity: Not on file   Other Topics Concern  . Not on file   Social History Narrative   Daily Caffeine Use 1-2 daily    Current Outpatient Prescriptions on File Prior to Visit  Medication Sig Dispense Refill  . aspirin 81 MG tablet Take 81 mg by mouth daily.    . Insulin Pen Needle 31G X 5 MM MISC Use as directed 2 X daily. BD ULTRAPFINE MINI. Dx: 250.00 100 each 5  . lactulose (CHRONULAC) 10 GM/15ML solution TAKE 30ML  ( 2 TABLESPOONSFUL) BY MOUTH TWICE A DAY 1892 mL 11  . Lancets (ONETOUCH ULTRASOFT) lancets Use as instructed 2x daily Dx: 250.00 100 each 5  . losartan-hydrochlorothiazide (HYZAAR) 100-12.5 MG tablet TAKE ONE (1) TABLET EACH DAY 30 tablet 3  . MAGNESIUM PO Take 1 tablet by mouth daily.     . metoprolol succinate (TOPROL-XL) 25 MG 24 hr tablet Take 1 tablet (25 mg total) by mouth daily. 90 tablet 3  . omeprazole (PRILOSEC) 40 MG capsule TAKE ONE (1) CAPSULE EACH DAY 30 capsule 11  . ONE TOUCH ULTRA TEST test strip TWICE DAILY 100 each 11  . simvastatin (ZOCOR) 40 MG tablet TAKE ONE TABLET BY MOUTH EVERY NIGHT AT BEDTIME 90 tablet 3  . vitamin E 400 UNIT capsule Take 400 Units by mouth daily.    . meloxicam (MOBIC) 15 MG tablet Take 1 tablet (15 mg total) by mouth daily. (Patient not taking:  Reported on 05/25/2017) 30 tablet 0   No current facility-administered medications on file prior to visit.     Allergies  Allergen Reactions  . Sulfamethoxazole-Trimethoprim Rash    Family History  Problem Relation Age of Onset  . Diabetes Mother   . Prostate cancer Brother   . Diabetes Brother        x 3 brother  . Heart disease Brother     BP 130/87   Pulse 67   Ht 6' (1.829 m)   Wt 225 lb (102.1 kg)   SpO2 97%   BMI 30.52 kg/m    Review of Systems He has lost 9 lbs, due to his efforts.      Objective:   Physical Exam VITAL SIGNS:  See vs page GENERAL: no distress Pulses: dorsalis pedis intact bilat.   MSK: no deformity of the feet CV:  trace bilat leg edema.   Skin:  no ulcer on the feet.  normal color and temp on the feet. Neuro: sensation is intact to touch on the feet.  Ext: There is bilateral onychomycosis of the toenails.    A1c=8.0%    Assessment & Plan:  Insulin-requiring type 2 DM, with DR: worse Noncompliance with cbg recording, insulin timing, and meals: I'll work around this as best I can  Patient Instructions  Please increase the insulin to 170 units with breakfast, and 60 units with supper.  For your safety, it is important to take this before meals.   On this type of insulin schedule, you should eat meals on a regular schedule.  If a meal is missed or significantly delayed, your blood sugar could go low.   check your blood sugar twice a day.  vary the time of day when you check, between before the 3 meals, and at bedtime.  also check if you have symptoms of your blood sugar being too high or too low.  please keep a record of the readings and bring it to your next appointment here.  You can write it on any piece of paper.  please call us sooner if your blood sugar goes below 70, or if you have a lot of readings over 200.   Please come back for a regular physical appointment in 3 months.

## 2017-07-15 ENCOUNTER — Other Ambulatory Visit: Payer: Self-pay | Admitting: Endocrinology

## 2017-08-17 ENCOUNTER — Other Ambulatory Visit: Payer: Self-pay | Admitting: Endocrinology

## 2017-08-25 ENCOUNTER — Encounter: Payer: Self-pay | Admitting: Endocrinology

## 2017-08-25 ENCOUNTER — Ambulatory Visit (INDEPENDENT_AMBULATORY_CARE_PROVIDER_SITE_OTHER): Payer: PPO | Admitting: Endocrinology

## 2017-08-25 VITALS — BP 132/72 | HR 73 | Wt 228.2 lb

## 2017-08-25 DIAGNOSIS — G4700 Insomnia, unspecified: Secondary | ICD-10-CM

## 2017-08-25 DIAGNOSIS — Z794 Long term (current) use of insulin: Secondary | ICD-10-CM | POA: Diagnosis not present

## 2017-08-25 DIAGNOSIS — E11319 Type 2 diabetes mellitus with unspecified diabetic retinopathy without macular edema: Secondary | ICD-10-CM | POA: Diagnosis not present

## 2017-08-25 DIAGNOSIS — Z23 Encounter for immunization: Secondary | ICD-10-CM | POA: Diagnosis not present

## 2017-08-25 LAB — CBC WITH DIFFERENTIAL/PLATELET
BASOS PCT: 1.1 % (ref 0.0–3.0)
Basophils Absolute: 0.1 10*3/uL (ref 0.0–0.1)
EOS PCT: 7.1 % — AB (ref 0.0–5.0)
Eosinophils Absolute: 0.4 10*3/uL (ref 0.0–0.7)
HEMATOCRIT: 48.5 % (ref 39.0–52.0)
HEMOGLOBIN: 16.3 g/dL (ref 13.0–17.0)
Lymphocytes Relative: 33.6 % (ref 12.0–46.0)
Lymphs Abs: 2 10*3/uL (ref 0.7–4.0)
MCHC: 33.6 g/dL (ref 30.0–36.0)
MCV: 96.1 fl (ref 78.0–100.0)
Monocytes Absolute: 0.6 10*3/uL (ref 0.1–1.0)
Monocytes Relative: 10.1 % (ref 3.0–12.0)
NEUTROS ABS: 2.9 10*3/uL (ref 1.4–7.7)
Neutrophils Relative %: 48.1 % (ref 43.0–77.0)
PLATELETS: 188 10*3/uL (ref 150.0–400.0)
RBC: 5.05 Mil/uL (ref 4.22–5.81)
RDW: 14.6 % (ref 11.5–15.5)
WBC: 6.1 10*3/uL (ref 4.0–10.5)

## 2017-08-25 LAB — URINALYSIS, ROUTINE W REFLEX MICROSCOPIC
Bilirubin Urine: NEGATIVE
HGB URINE DIPSTICK: NEGATIVE
Leukocytes, UA: NEGATIVE
NITRITE: NEGATIVE
RBC / HPF: NONE SEEN (ref 0–?)
Urine Glucose: NEGATIVE
Urobilinogen, UA: 0.2 (ref 0.0–1.0)
WBC UA: NONE SEEN (ref 0–?)
pH: 5.5 (ref 5.0–8.0)

## 2017-08-25 LAB — HEPATIC FUNCTION PANEL
ALK PHOS: 48 U/L (ref 39–117)
ALT: 31 U/L (ref 0–53)
AST: 28 U/L (ref 0–37)
Albumin: 4 g/dL (ref 3.5–5.2)
BILIRUBIN DIRECT: 0.1 mg/dL (ref 0.0–0.3)
BILIRUBIN TOTAL: 0.7 mg/dL (ref 0.2–1.2)
Total Protein: 7.1 g/dL (ref 6.0–8.3)

## 2017-08-25 LAB — POCT GLYCOSYLATED HEMOGLOBIN (HGB A1C): HEMOGLOBIN A1C: 7.6

## 2017-08-25 LAB — LIPID PANEL
CHOL/HDL RATIO: 3
CHOLESTEROL: 96 mg/dL (ref 0–200)
HDL: 32 mg/dL — ABNORMAL LOW (ref >39.00)
NONHDL: 64.13
Triglycerides: 322 mg/dL — ABNORMAL HIGH (ref 0.0–149.0)
VLDL: 64.4 mg/dL — ABNORMAL HIGH (ref 0.0–40.0)

## 2017-08-25 LAB — MICROALBUMIN / CREATININE URINE RATIO
CREATININE, U: 172.8 mg/dL
MICROALB UR: 11.1 mg/dL — AB (ref 0.0–1.9)
Microalb Creat Ratio: 6.4 mg/g (ref 0.0–30.0)

## 2017-08-25 LAB — LDL CHOLESTEROL, DIRECT: Direct LDL: 43 mg/dL

## 2017-08-25 LAB — BASIC METABOLIC PANEL
BUN: 17 mg/dL (ref 6–23)
CALCIUM: 9.8 mg/dL (ref 8.4–10.5)
CHLORIDE: 101 meq/L (ref 96–112)
CO2: 28 meq/L (ref 19–32)
Creatinine, Ser: 1.29 mg/dL (ref 0.40–1.50)
GFR: 58.65 mL/min — ABNORMAL LOW (ref >60.00)
GLUCOSE: 224 mg/dL — AB (ref 70–99)
Potassium: 4.2 mEq/L (ref 3.5–5.1)
Sodium: 135 mEq/L (ref 135–145)

## 2017-08-25 LAB — TSH: TSH: 1.36 u[IU]/mL (ref 0.35–4.50)

## 2017-08-25 MED ORDER — TRAZODONE HCL 50 MG PO TABS: 50.0000 mg | ORAL_TABLET | Freq: Every day | ORAL | 11 refills | Status: DC

## 2017-08-25 NOTE — Patient Instructions (Addendum)
I have sent a prescription to your pharmacy, for the sleep. Please continue the same insulin. good diet and exercise significantly improve the control of your diabetes.  please let me know if you wish to be referred to a dietician.  high blood sugar is very risky to your health.  you should see an eye doctor and dentist every year.  It is very important to get all recommended vaccinations.  Controlling your blood pressure and cholesterol drastically reduces the damage diabetes does to your body.  Those who smoke should quit.  Please discuss these with your doctor.  check your blood sugar twice a day.  vary the time of day when you check, between before the 3 meals, and at bedtime.  also check if you have symptoms of your blood sugar being too high or too low.  please keep a record of the readings and bring it to your next appointment here (or you can bring the meter itself).  You can write it on any piece of paper.  please call us sooner if your blood sugar goes below 70, or if you have a lot of readings over 200.   Please come back for a follow-up appointment in 4 months       Insomnia Insomnia is a sleep disorder that makes it difficult to fall asleep or to stay asleep. Insomnia can cause tiredness (fatigue), low energy, difficulty concentrating, mood swings, and poor performance at work or school. There are three different ways to classify insomnia:  Difficulty falling asleep.  Difficulty staying asleep.  Waking up too early in the morning.  Any type of insomnia can be long-term (chronic) or short-term (acute). Both are common. Short-term insomnia usually lasts for three months or less. Chronic insomnia occurs at least three times a week for longer than three months. What are the causes? Insomnia may be caused by another condition, situation, or substance, such as:  Anxiety.  Certain medicines.  Gastroesophageal reflux disease (GERD) or other gastrointestinal conditions.  Asthma or  other breathing conditions.  Restless legs syndrome, sleep apnea, or other sleep disorders.  Chronic pain.  Menopause. This may include hot flashes.  Stroke.  Abuse of alcohol, tobacco, or illegal drugs.  Depression.  Caffeine.  Neurological disorders, such as Alzheimer disease.  An overactive thyroid (hyperthyroidism).  The cause of insomnia may not be known. What increases the risk? Risk factors for insomnia include:  Gender. Women are more commonly affected than men.  Age. Insomnia is more common as you get older.  Stress. This may involve your professional or personal life.  Income. Insomnia is more common in people with lower income.  Lack of exercise.  Irregular work schedule or night shifts.  Traveling between different time zones.  What are the signs or symptoms? If you have insomnia, trouble falling asleep or trouble staying asleep is the main symptom. This may lead to other symptoms, such as:  Feeling fatigued.  Feeling nervous about going to sleep.  Not feeling rested in the morning.  Having trouble concentrating.  Feeling irritable, anxious, or depressed.  How is this treated? Treatment for insomnia depends on the cause. If your insomnia is caused by an underlying condition, treatment will focus on addressing the condition. Treatment may also include:  Medicines to help you sleep.  Counseling or therapy.  Lifestyle adjustments.  Follow these instructions at home:  Take medicines only as directed by your health care provider.  Keep regular sleeping and waking hours. Avoid naps.  Keep a sleep diary to help you and your health care provider figure out what could be causing your insomnia. Include: ? When you sleep. ? When you wake up during the night. ? How well you sleep. ? How rested you feel the next day. ? Any side effects of medicines you are taking. ? What you eat and drink.  Make your bedroom a comfortable place where it is easy  to fall asleep: ? Put up shades or special blackout curtains to block light from outside. ? Use a white noise machine to block noise. ? Keep the temperature cool.  Exercise regularly as directed by your health care provider. Avoid exercising right before bedtime.  Use relaxation techniques to manage stress. Ask your health care provider to suggest some techniques that may work well for you. These may include: ? Breathing exercises. ? Routines to release muscle tension. ? Visualizing peaceful scenes.  Cut back on alcohol, caffeinated beverages, and cigarettes, especially close to bedtime. These can disrupt your sleep.  Do not overeat or eat spicy foods right before bedtime. This can lead to digestive discomfort that can make it hard for you to sleep.  Limit screen use before bedtime. This includes: ? Watching TV. ? Using your smartphone, tablet, and computer.  Stick to a routine. This can help you fall asleep faster. Try to do a quiet activity, brush your teeth, and go to bed at the same time each night.  Get out of bed if you are still awake after 15 minutes of trying to sleep. Keep the lights down, but try reading or doing a quiet activity. When you feel sleepy, go back to bed.  Make sure that you drive carefully. Avoid driving if you feel very sleepy.  Keep all follow-up appointments as directed by your health care provider. This is important. Contact a health care provider if:  You are tired throughout the day or have trouble in your daily routine due to sleepiness.  You continue to have sleep problems or your sleep problems get worse. Get help right away if:  You have serious thoughts about hurting yourself or someone else. This information is not intended to replace advice given to you by your health care provider. Make sure you discuss any questions you have with your health care provider. Document Released: 11/07/2000 Document Revised: 04/11/2016 Document Reviewed:  08/11/2014 Elsevier Interactive Patient Education  [employer].

## 2017-08-25 NOTE — Progress Notes (Signed)
Subjective:    Patient ID: [name], male    DOB: 09-May-1946, 71 y.o.   MRN: [id number]  HPI Pt is here for regular wellness examination, and is feeling pretty well in general, and says chronic med probs are stable, except as noted below Past Medical History:  Diagnosis Date  . Adenomatous colon polyp 06/2001  . ALLERGIC RHINITIS 08/03/2007  . BENIGN PROSTATIC HYPERTROPHY 08/03/2007  . 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 DIABETIC RETINOPATHY, BACKGROUND 09/29/2008  . DYSPHAGIA UNSPECIFIED 09/29/2008  . 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 GERD 11/28/2008  . HYPERLIPIDEMIA 08/03/2007  . 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 PAIN IN SOFT TISSUES OF LIMB 01/07/2008  . UNSPECIFIED ANEMIA 12/29/2007  . Unspecified essential hypertension 01/07/2008  . URINARY CALCULUS 09/29/2008    Past Surgical History:  Procedure Laterality Date  . CHOLECYSTECTOMY N/A 11/12/2016   Procedure: LAPAROSCOPIC CHOLECYSTECTOMY WITH INTRAOPERATIVE CHOLANGIOGRAM;  Surgeon: Clayburn Pert, MD;  Location: [facility] ORS;  Service: General;  Laterality: N/A;  . LITHOTRIPSY    . SHOULDER SURGERY     left, cyst and tumor removed    Social History   Social History  . Marital status: Married    Spouse name: N/A  . Number of children: 2  . Years of education: N/A   Occupational History  . [employer]   Social History Main Topics  . Smoking status: Never Smoker  . Smokeless tobacco: Never Used  . Alcohol use Yes     Comment: occasional  . Drug use: No  . Sexual activity: Not on file   Other Topics Concern  . Not on file   Social History Narrative   Daily Caffeine Use 1-2 daily    Current Outpatient Prescriptions on File Prior to Visit  Medication Sig Dispense Refill  . aspirin 81 MG tablet Take 81 mg by mouth daily.    . insulin NPH-regular Human (NOVOLIN 70/30 RELION) (70-30) 100 UNIT/ML injection 170 units with breakfast, and 60 units with supper. 80 mL 13  . Insulin  Pen Needle 31G X 5 MM MISC Use as directed 2 X daily. BD ULTRAPFINE MINI. Dx: 250.00 100 each 5  . lactulose (CHRONULAC) 10 GM/15ML solution TAKE 30ML  ( 2 TABLESPOONSFUL) BY MOUTH TWICE A DAY 1892 mL 11  . Lancets (ONETOUCH ULTRASOFT) lancets Use as instructed 2x daily Dx: 250.00 100 each 5  . losartan-hydrochlorothiazide (HYZAAR) 100-12.5 MG tablet TAKE ONE (1) TABLET BY MOUTH EVERY DAY 30 tablet 2  . MAGNESIUM PO Take 1 tablet by mouth daily.     . meloxicam (MOBIC) 15 MG tablet Take 1 tablet (15 mg total) by mouth daily. (Patient not taking: Reported on 05/25/2017) 30 tablet 0  . metoprolol succinate (TOPROL-XL) 25 MG 24 hr tablet Take 1 tablet (25 mg total) by mouth daily. 90 tablet 3  . NON FORMULARY Prostate Peanut from [place].    Marland Kitchen omeprazole (PRILOSEC) 40 MG capsule TAKE ONE CAPSULE BY MOUTH DAILY 30 capsule 4  . ONE TOUCH ULTRA TEST test strip TWICE DAILY 100 each 11  . simvastatin (ZOCOR) 40 MG tablet TAKE ONE TABLET BY MOUTH EVERY NIGHT AT BEDTIME 90 tablet 3  . vitamin E 400 UNIT capsule Take 400 Units by mouth daily.     No current facility-administered medications on file prior to visit.     Allergies  Allergen Reactions  . Sulfamethoxazole-Trimethoprim Rash    Family History  Problem Relation Age of Onset  .  Diabetes Mother   . Prostate cancer Brother   . Diabetes Brother        x 3 brother  . Heart disease Brother     BP 132/72   Pulse 73   Wt 228 lb 3.2 oz (103.5 kg)   SpO2 96%   BMI 30.95 kg/m     Review of Systems Denies fever, visual loss, chest pain, back pain, depression, cold intolerance, BRBPR, hematuria, numbness, allergy sxs, easy bruising, and rash.  He has fatigue.  No change in chronic hearing loss (declines HA's).   No change in chronic doe     Objective:   Physical Exam VS: see vs page GEN: no distress HEAD: head: no deformity eyes: no periorbital swelling, no proptosis external nose and ears are normal mouth: no lesion  seen NECK: supple, thyroid is not enlarged CHEST WALL: no deformity LUNGS: clear to auscultation BREASTS:  No gynecomastia CV: reg rate and rhythm, no murmur ABD: abdomen is soft, nontender.  no hepatosplenomegaly.  not distended.  Self-reducing ventral hernia GENITALIA/RECTAL/PROSTATE: sees urology MUSCULOSKELETAL: muscle bulk and strength are grossly normal.  no obvious joint swelling.  gait is normal and steady PULSES: no carotid bruit NEURO:  cn 2-12 grossly intact.   readily moves all 4's.   SKIN:  Normal texture and temperature.  No rash or suspicious lesion is visible.   NODES:  None palpable at the neck PSYCH: alert, well-oriented.  Does not appear anxious nor depressed.         Assessment & Plan:  Wellness visit today, with problems stable, except as noted.   SEPARATE EVALUATION FOLLOWS--EACH PROBLEM HERE IS NEW, NOT RESPONDING TO TREATMENT, OR POSES SIGNIFICANT RISK TO THE PATIENT'S HEALTH: HISTORY OF THE PRESENT ILLNESS: Pt returns for f/u of diabetes mellitus: DM type: Insulin-requiring type 2 Dx'ed: 3220 Complications: nephropathy and retinopathy.  Therapy: insulin since soon after dx.   DKA: never Severe hypoglycemia: never.   Pancreatitis: never Other: he is on a BID insulin schedule, due to noncompliance; he takes human insulin, due to cost.  He is retired.  Interval history: no cbg record, but states cbg's are well-controlled.  There is no trend throughout the day. He seldom has hypoglycemia.  This happens when a meals is missed or delayed.   PAST MEDICAL HISTORY Past Medical History:  Diagnosis Date  . Adenomatous colon polyp 06/2001  . ALLERGIC RHINITIS 08/03/2007  . BENIGN PROSTATIC HYPERTROPHY 08/03/2007  . 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 DIABETIC RETINOPATHY, BACKGROUND 09/29/2008  . DYSPHAGIA UNSPECIFIED 09/29/2008  . 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 GERD 11/28/2008  . HYPERLIPIDEMIA 08/03/2007  . OSTEOARTHRITIS, 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  . PAIN IN SOFT TISSUES OF LIMB 01/07/2008  . UNSPECIFIED ANEMIA 12/29/2007  . Unspecified essential hypertension 01/07/2008  . URINARY CALCULUS 09/29/2008    Past Surgical History:  Procedure Laterality Date  . CHOLECYSTECTOMY N/A 11/12/2016   Procedure: LAPAROSCOPIC CHOLECYSTECTOMY WITH INTRAOPERATIVE CHOLANGIOGRAM;  Surgeon: Clayburn Pert, MD;  Location: [facility] ORS;  Service: General;  Laterality: N/A;  . LITHOTRIPSY    . SHOULDER SURGERY     left, cyst and tumor removed    Social History   Social History  . Marital status: Married    Spouse name: N/A  . Number of children: 2  . Years of education: N/A   Occupational History  . [employer]   Social History Main Topics  . Smoking status: Never Smoker  . Smokeless tobacco: Never  Used  . Alcohol use Yes     Comment: occasional  . Drug use: No  . Sexual activity: Not on file   Other Topics Concern  . Not on file   Social History Narrative   Daily Caffeine Use 1-2 daily    Current Outpatient Prescriptions on File Prior to Visit  Medication Sig Dispense Refill  . aspirin 81 MG tablet Take 81 mg by mouth daily.    . insulin NPH-regular Human (NOVOLIN 70/30 RELION) (70-30) 100 UNIT/ML injection 170 units with breakfast, and 60 units with supper. 80 mL 13  . Insulin Pen Needle 31G X 5 MM MISC Use as directed 2 X daily. BD ULTRAPFINE MINI. Dx: 250.00 100 each 5  . lactulose (CHRONULAC) 10 GM/15ML solution TAKE 30ML  ( 2 TABLESPOONSFUL) BY MOUTH TWICE A DAY 1892 mL 11  . Lancets (ONETOUCH ULTRASOFT) lancets Use as instructed 2x daily Dx: 250.00 100 each 5  . losartan-hydrochlorothiazide (HYZAAR) 100-12.5 MG tablet TAKE ONE (1) TABLET BY MOUTH EVERY DAY 30 tablet 2  . MAGNESIUM PO Take 1 tablet by mouth daily.     . meloxicam (MOBIC) 15 MG tablet Take 1 tablet (15 mg total) by mouth daily. (Patient not taking: Reported on 05/25/2017) 30 tablet 0  . metoprolol succinate (TOPROL-XL) 25 MG 24 hr tablet Take 1  tablet (25 mg total) by mouth daily. 90 tablet 3  . NON FORMULARY Prostate Peanut from [place].    Marland Kitchen omeprazole (PRILOSEC) 40 MG capsule TAKE ONE CAPSULE BY MOUTH DAILY 30 capsule 4  . ONE TOUCH ULTRA TEST test strip TWICE DAILY 100 each 11  . simvastatin (ZOCOR) 40 MG tablet TAKE ONE TABLET BY MOUTH EVERY NIGHT AT BEDTIME 90 tablet 3  . vitamin E 400 UNIT capsule Take 400 Units by mouth daily.     No current facility-administered medications on file prior to visit.     Allergies  Allergen Reactions  . Sulfamethoxazole-Trimethoprim Rash    Family History  Problem Relation Age of Onset  . Diabetes Mother   . Prostate cancer Brother   . Diabetes Brother        x 3 brother  . Heart disease Brother     BP 132/72   Pulse 73   Wt 228 lb 3.2 oz (103.5 kg)   SpO2 96%   BMI 30.95 kg/m   REVIEW OF SYSTEMS: Denies LOC.  He has insomnia PHYSICAL EXAMINATION: VITAL SIGNS:  See vs page GENERAL: no distress Pulses: foot pulses are intact bilaterally.   MSK: no deformity of the feet or ankles.  CV: trace bilat edema of the legs Skin:  no ulcer on the feet or ankles.  normal color and temp on the feet and ankles.   Neuro: sensation is intact to touch on the feet and ankles.   Ext: There is bilateral onychomycosis of the toenails.   LAB/XRAY RESULTS: Lab Results  Component Value Date   HGBA1C 7.6 08/25/2017   IMPRESSION: Insomnia, new Insulin-requiring type 2 DM, with DR: this is the best control this pt should aim for, given this regimen, which does match insulin to his changing needs throughout the day PLAN:  I have sent a prescription to your pharmacy, for the sleep. Please continue the same insulin.

## 2017-11-20 ENCOUNTER — Other Ambulatory Visit: Payer: Self-pay

## 2017-11-20 MED ORDER — SIMVASTATIN 40 MG PO TABS: 40.0000 mg | ORAL_TABLET | Freq: Every day | ORAL | 3 refills | Status: DC

## 2017-11-20 MED ORDER — LOSARTAN POTASSIUM-HCTZ 100-12.5 MG PO TABS: ORAL_TABLET | ORAL | 2 refills | Status: DC

## 2017-12-02 DIAGNOSIS — L2489 Irritant contact dermatitis due to other agents: Secondary | ICD-10-CM | POA: Diagnosis not present

## 2017-12-04 ENCOUNTER — Telehealth: Payer: Self-pay | Admitting: Endocrinology

## 2017-12-04 ENCOUNTER — Other Ambulatory Visit: Payer: Self-pay | Admitting: Endocrinology

## 2017-12-04 ENCOUNTER — Other Ambulatory Visit: Payer: Self-pay

## 2017-12-04 NOTE — Telephone Encounter (Signed)
I sent in earlier today.

## 2017-12-04 NOTE — Telephone Encounter (Signed)
Patient needs script for Novolin 70/30 sent to [facility] in [place] on [address] asap- He gets his insulin at [facility] If questions call patient at ph# [phone]

## 2017-12-05 ENCOUNTER — Other Ambulatory Visit: Payer: Self-pay | Admitting: Endocrinology

## 2017-12-10 ENCOUNTER — Other Ambulatory Visit: Payer: Self-pay | Admitting: Endocrinology

## 2017-12-10 DIAGNOSIS — R3916 Straining to void: Secondary | ICD-10-CM | POA: Diagnosis not present

## 2017-12-10 DIAGNOSIS — R972 Elevated prostate specific antigen [PSA]: Secondary | ICD-10-CM | POA: Diagnosis not present

## 2017-12-10 DIAGNOSIS — R3914 Feeling of incomplete bladder emptying: Secondary | ICD-10-CM | POA: Diagnosis not present

## 2017-12-10 DIAGNOSIS — R3915 Urgency of urination: Secondary | ICD-10-CM | POA: Diagnosis not present

## 2017-12-10 DIAGNOSIS — N486 Induration penis plastica: Secondary | ICD-10-CM | POA: Diagnosis not present

## 2017-12-10 DIAGNOSIS — R351 Nocturia: Secondary | ICD-10-CM | POA: Diagnosis not present

## 2017-12-10 DIAGNOSIS — N5201 Erectile dysfunction due to arterial insufficiency: Secondary | ICD-10-CM | POA: Diagnosis not present

## 2017-12-10 DIAGNOSIS — R310 Gross hematuria: Secondary | ICD-10-CM | POA: Diagnosis not present

## 2017-12-10 DIAGNOSIS — N401 Enlarged prostate with lower urinary tract symptoms: Secondary | ICD-10-CM | POA: Diagnosis not present

## 2017-12-15 DIAGNOSIS — N401 Enlarged prostate with lower urinary tract symptoms: Secondary | ICD-10-CM | POA: Diagnosis not present

## 2017-12-15 DIAGNOSIS — R310 Gross hematuria: Secondary | ICD-10-CM | POA: Diagnosis not present

## 2017-12-15 DIAGNOSIS — N5201 Erectile dysfunction due to arterial insufficiency: Secondary | ICD-10-CM | POA: Diagnosis not present

## 2017-12-21 DIAGNOSIS — N401 Enlarged prostate with lower urinary tract symptoms: Secondary | ICD-10-CM | POA: Diagnosis not present

## 2017-12-23 ENCOUNTER — Other Ambulatory Visit: Payer: Self-pay | Admitting: Urology

## 2017-12-23 DIAGNOSIS — R310 Gross hematuria: Secondary | ICD-10-CM

## 2017-12-23 DIAGNOSIS — N5201 Erectile dysfunction due to arterial insufficiency: Secondary | ICD-10-CM | POA: Diagnosis not present

## 2017-12-23 DIAGNOSIS — R7989 Other specified abnormal findings of blood chemistry: Secondary | ICD-10-CM

## 2017-12-23 DIAGNOSIS — N401 Enlarged prostate with lower urinary tract symptoms: Secondary | ICD-10-CM | POA: Diagnosis not present

## 2017-12-23 DIAGNOSIS — R972 Elevated prostate specific antigen [PSA]: Secondary | ICD-10-CM | POA: Diagnosis not present

## 2017-12-24 DIAGNOSIS — R0789 Other chest pain: Secondary | ICD-10-CM | POA: Diagnosis not present

## 2017-12-24 DIAGNOSIS — F458 Other somatoform disorders: Secondary | ICD-10-CM | POA: Diagnosis not present

## 2017-12-24 DIAGNOSIS — R0600 Dyspnea, unspecified: Secondary | ICD-10-CM | POA: Diagnosis not present

## 2017-12-25 ENCOUNTER — Ambulatory Visit (INDEPENDENT_AMBULATORY_CARE_PROVIDER_SITE_OTHER): Payer: PPO | Admitting: Endocrinology

## 2017-12-25 ENCOUNTER — Encounter: Payer: Self-pay | Admitting: Endocrinology

## 2017-12-25 VITALS — BP 134/83 | HR 72 | Wt 234.8 lb

## 2017-12-25 DIAGNOSIS — Z794 Long term (current) use of insulin: Secondary | ICD-10-CM

## 2017-12-25 DIAGNOSIS — E11319 Type 2 diabetes mellitus with unspecified diabetic retinopathy without macular edema: Secondary | ICD-10-CM | POA: Diagnosis not present

## 2017-12-25 LAB — POCT GLYCOSYLATED HEMOGLOBIN (HGB A1C): HEMOGLOBIN A1C: 7.8

## 2017-12-25 MED ORDER — INSULIN NPH ISOPHANE & REGULAR (70-30) 100 UNIT/ML ~~LOC~~ SUSP: SUBCUTANEOUS | 13 refills | Status: DC

## 2017-12-25 NOTE — Patient Instructions (Addendum)
Please continue the same insulin.   check your blood sugar twice a day.  vary the time of day when you check, between before the 3 meals, and at bedtime.  also check if you have symptoms of your blood sugar being too high or too low.  please keep a record of the readings and bring it to your next appointment here (or you can bring the meter itself).  You can write it on any piece of paper.  please call us sooner if your blood sugar goes below 70, or if you have a lot of readings over 200.   Please come back for a follow-up appointment in 4 months.    

## 2017-12-25 NOTE — Progress Notes (Signed)
   Subjective:    Patient ID: [name], male    DOB: [birth year], 72 y.o.   MRN: [id number]  HPI Pt returns for f/u of diabetes mellitus: DM type: Insulin-requiring type 2 Dx'ed: 0630 Complications: nephropathy and retinopathy.  Therapy: insulin since soon after dx.   DKA: never Severe hypoglycemia: never.   Pancreatitis: never.   Other: he is on a BID insulin schedule, due to noncompliance; he takes human insulin, due to cost.  He is retired.   Interval history: no cbg record, but states cbg's are well-controlled.  There is no trend throughout the day. He seldom has hypoglycemia.  This happens when a meals is missed or delayed.     Review of Systems He denies LOC.      Objective:   Physical Exam VITAL SIGNS:  See vs page GENERAL: no distress Pulses: dorsalis pedis intact bilat.   MSK: no deformity of the feet CV: no leg edema Skin:  no ulcer on the feet.  normal color and temp on the feet. Neuro: sensation is intact to touch on the feet Ext: There is bilateral onychomycosis of the toenails  Lab Results  Component Value Date   CREATININE 1.29 08/25/2017   BUN 17 08/25/2017   NA 135 08/25/2017   K 4.2 08/25/2017   CL 101 08/25/2017   CO2 28 08/25/2017   Lab Results  Component Value Date   HGBA1C 7.8 12/25/2017      Assessment & Plan:  Type 2 DM, with DR: worse Hypoglycemia: this is limiting aggressiveness of glycemic control  Patient Instructions  Please continue the same insulin.  check your blood sugar twice a day.  vary the time of day when you check, between before the 3 meals, and at bedtime.  also check if you have symptoms of your blood sugar being too high or too low.  please keep a record of the readings and bring it to your next appointment here (or you can bring the meter itself).  You can write it on any piece of paper.  please call us sooner if your blood sugar goes below 70, or if you have a lot of readings over 200.   Please come back for a  follow-up appointment in 4 months.

## 2017-12-31 ENCOUNTER — Ambulatory Visit
Admission: RE | Admit: 2017-12-31 | Discharge: 2017-12-31 | Disposition: A | Discharge status: Home / Self Care | Payer: PPO | Source: Ambulatory Visit | Attending: Urology | Admitting: Urology

## 2017-12-31 DIAGNOSIS — I700 Atherosclerosis of aorta: Secondary | ICD-10-CM | POA: Diagnosis not present

## 2017-12-31 DIAGNOSIS — R310 Gross hematuria: Secondary | ICD-10-CM | POA: Insufficient documentation

## 2017-12-31 DIAGNOSIS — R7989 Other specified abnormal findings of blood chemistry: Secondary | ICD-10-CM

## 2017-12-31 DIAGNOSIS — K760 Fatty (change of) liver, not elsewhere classified: Secondary | ICD-10-CM | POA: Insufficient documentation

## 2017-12-31 DIAGNOSIS — I2510 Atherosclerotic heart disease of native coronary artery without angina pectoris: Secondary | ICD-10-CM | POA: Diagnosis not present

## 2017-12-31 DIAGNOSIS — N400 Enlarged prostate without lower urinary tract symptoms: Secondary | ICD-10-CM | POA: Insufficient documentation

## 2017-12-31 DIAGNOSIS — N200 Calculus of kidney: Secondary | ICD-10-CM | POA: Insufficient documentation

## 2017-12-31 IMAGING — CT CT ABD-PELV W/O CM
2 of 4 series · 15 of 46 positions shown, 17 images · non-contrast
Comparison: [DATE]

CLINICAL DATA: Elevated creatinine. Gross hematuria 1 month ago
with dysuria. Enlarged prostate. Prior lithotripsy.

EXAM:
CT ABDOMEN AND PELVIS WITHOUT CONTRAST
TECHNIQUE: Multidetector CT imaging of the abdomen and pelvis was performed
following the standard protocol without IV contrast.

[Series 2: routine abdomen pelvis without 5.00 br40 s3 · axial · non-contrast · 0.81mm/px · z∈[-1830,-1325]mm · 12 of 111 slices shown, 14 images]
[im 5/111  soft-tissue]
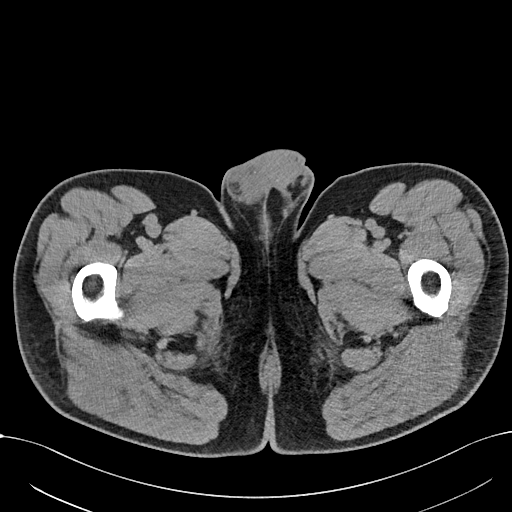
[im 5/111  bone]
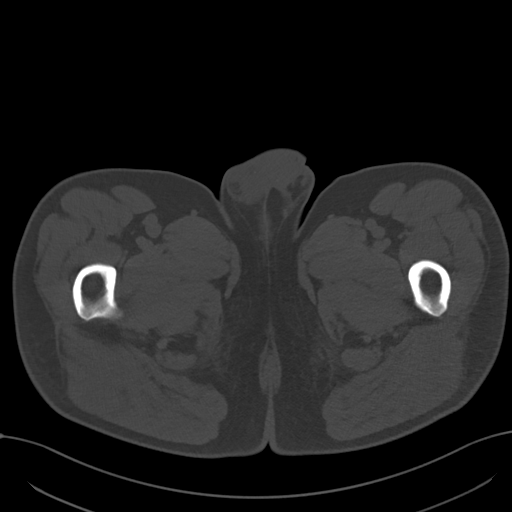
[im 14/111  soft-tissue]
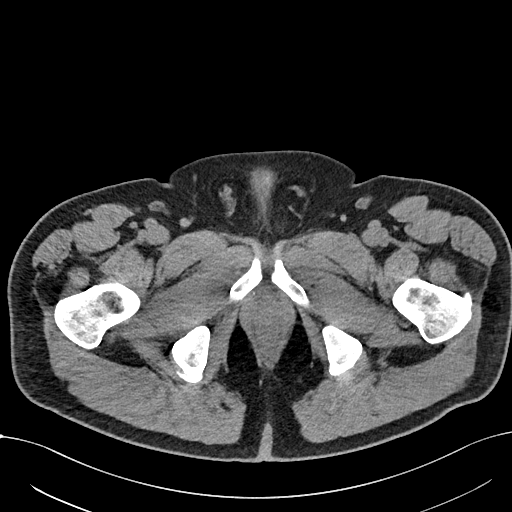
[im 23/111  soft-tissue]
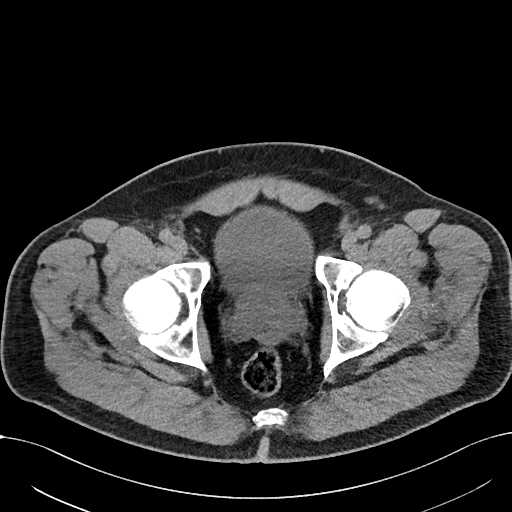
[im 33/111  soft-tissue]
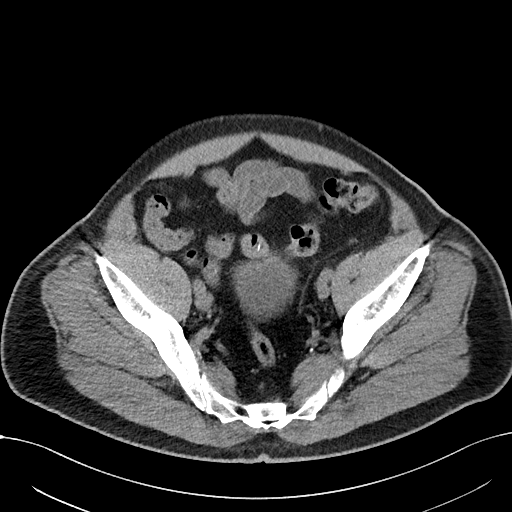
[im 42/111  soft-tissue]
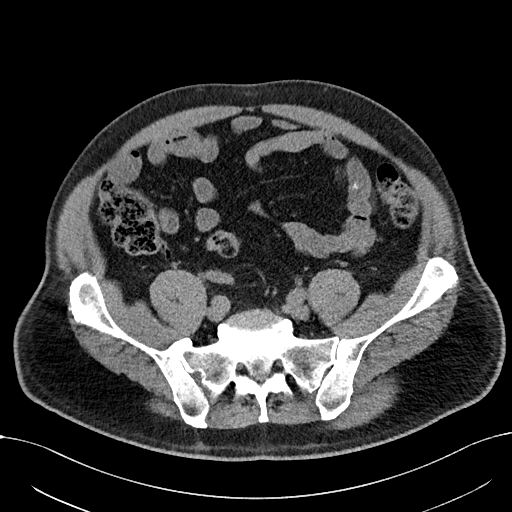
[im 51/111  soft-tissue]
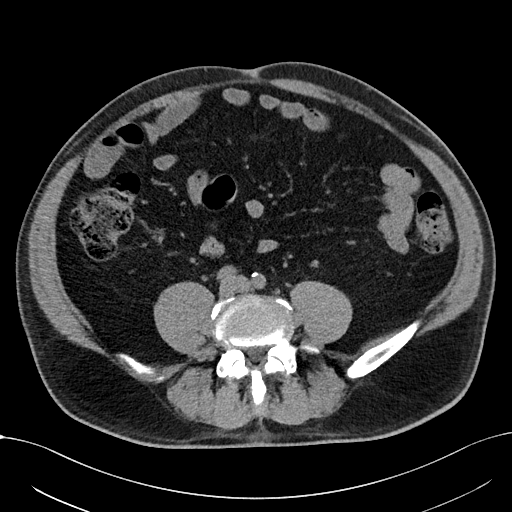
[im 60/111  soft-tissue]
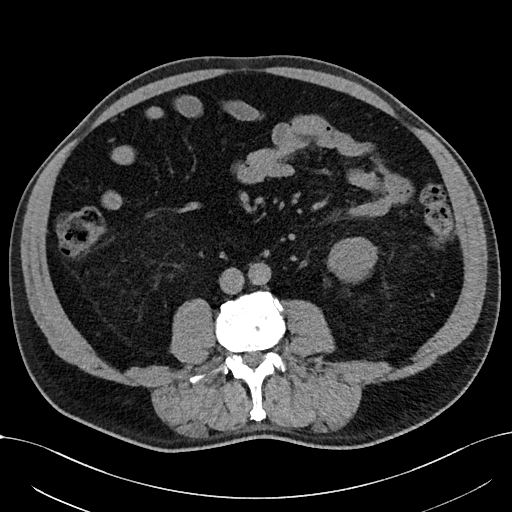
[im 69/111  soft-tissue]
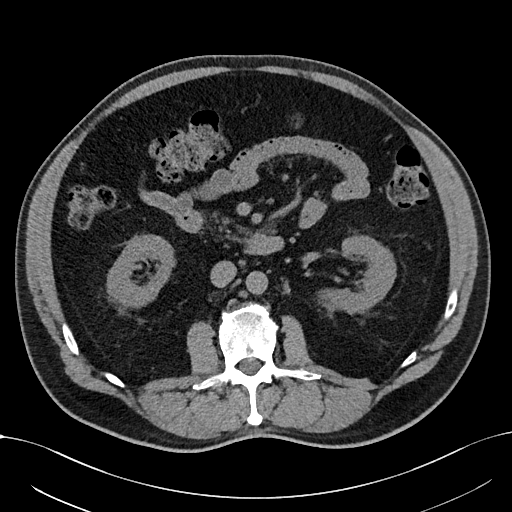
[im 78/111  soft-tissue]
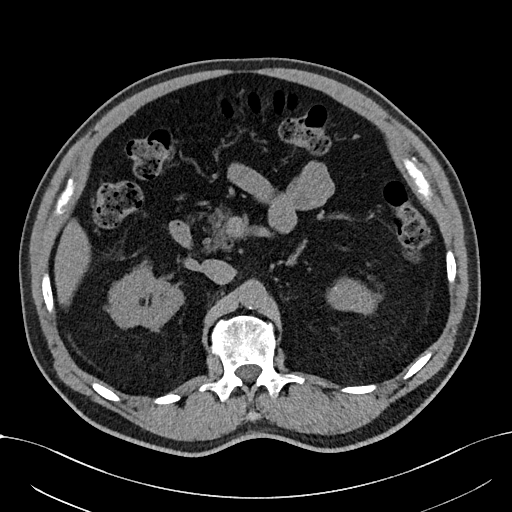
[im 78/111  bone]
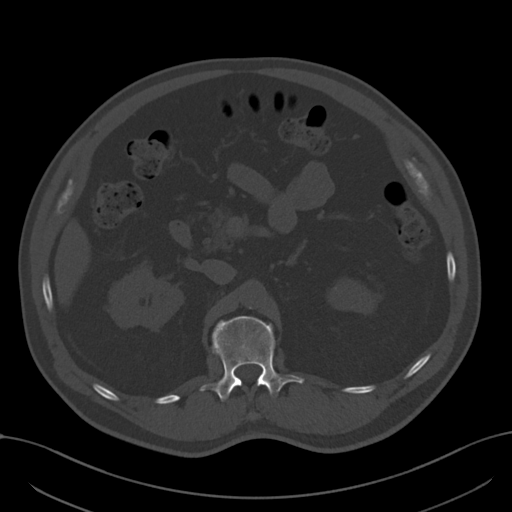
[im 88/111  soft-tissue]
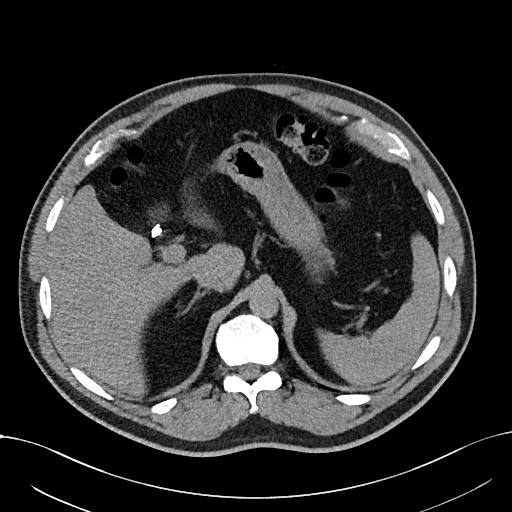
[im 97/111  soft-tissue]
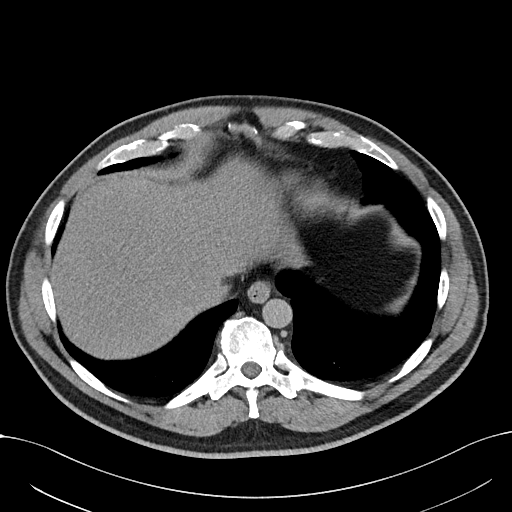
[im 106/111  soft-tissue]
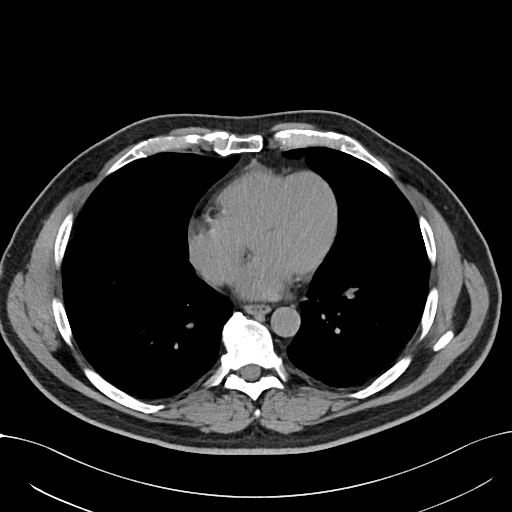

[Series 5: routine abdomen pelvis without 2.00 br40 s3 cor · coronal · non-contrast · 0.80mm/px · 3 of 178 slices shown]
[im 60/178  soft-tissue]
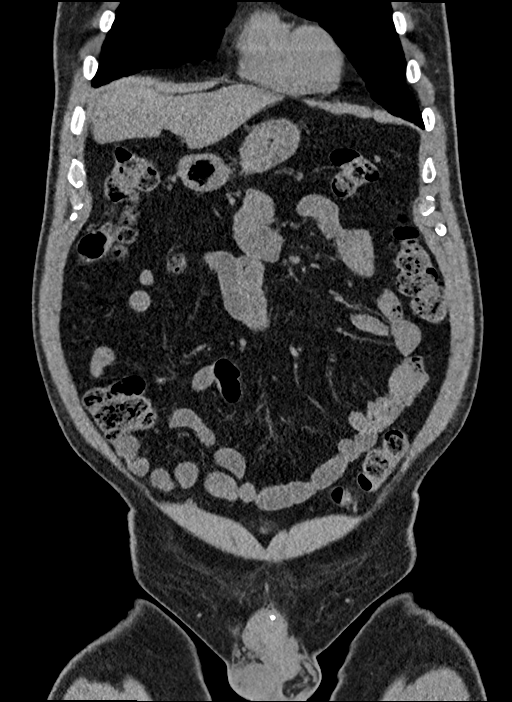
[im 79/178  soft-tissue]
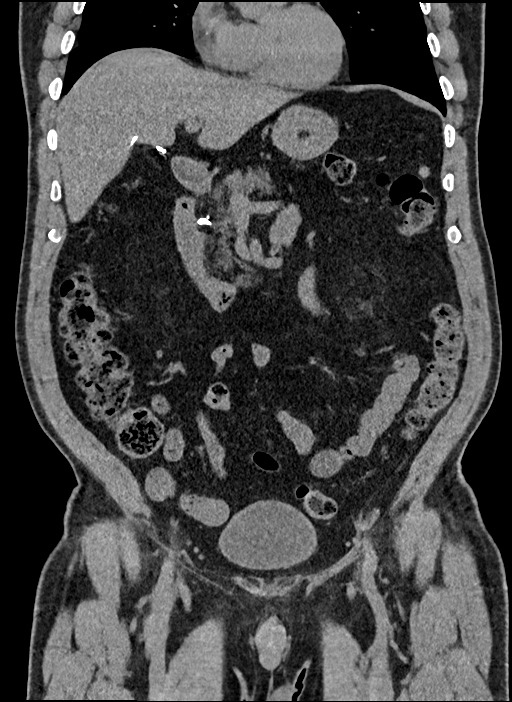
[im 99/178  soft-tissue]
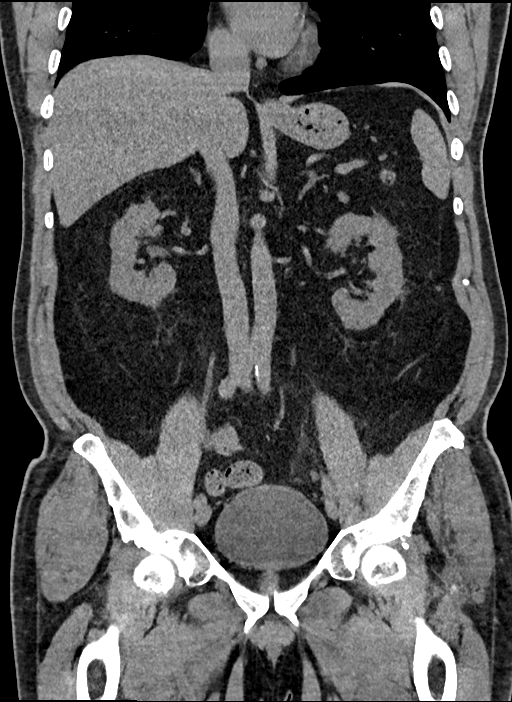

[15 of 46 positions shown; findings below may reference images not displayed]

FINDINGS: Lower chest: Clear lung bases. Normal heart size without pericardial
or pleural effusion. Multivessel coronary artery atherosclerosis.

Hepatobiliary: Mild hepatic steatosis, without focal liver lesion.
Cholecystectomy, without biliary ductal dilatation.

Pancreas: Mild pancreatic atrophy. No duct dilatation or acute
inflammation.

Spleen: Normal in size, without focal abnormality.

Adrenals/Urinary Tract: Normal adrenal glands. 3 mm lower pole left
renal collecting system calculus. Exophytic upper pole left renal
1.6 cm lesion measures greater than fluid density, including on
image 32/series 2. This was similar in size and most consistent with
a complex cyst back on [DATE].

Exophytic lower pole right renal 8 mm soft tissue density lesion
image 50/series 2 was similar in size back in [FD] and most
consistent with a cyst or minimally complex cyst on that exam.

No hydronephrosis. No hydroureter or ureteric calculi. No bladder
calculi.

Stomach/Bowel: Proximal gastric underdistention. Normal colon,
appendix, and terminal ileum. Normal small bowel.

Vascular/Lymphatic: Aortic and branch vessel atherosclerosis. No
abdominopelvic adenopathy.

Reproductive: Moderate prostatomegaly.

Other: No significant free fluid.

Musculoskeletal: Degenerative partial fusion of the right sacroiliac
joint.
IMPRESSION: 1. Left nephrolithiasis, without obstructive uropathy.
2. Exophytic bilateral renal lesions as detailed above. These are
technically indeterminate on the current exam, but given size
stability and appearance on [DATE], favored to represent complex
cysts.
3. Prostatomegaly.
4. Coronary artery atherosclerosis. Aortic Atherosclerosis
([FD]-[FD]).
5. Hepatic steatosis.

## 2018-01-06 DIAGNOSIS — R0789 Other chest pain: Secondary | ICD-10-CM | POA: Diagnosis not present

## 2018-01-06 DIAGNOSIS — E782 Mixed hyperlipidemia: Secondary | ICD-10-CM | POA: Diagnosis not present

## 2018-01-06 DIAGNOSIS — I10 Essential (primary) hypertension: Secondary | ICD-10-CM | POA: Diagnosis not present

## 2018-01-06 DIAGNOSIS — F458 Other somatoform disorders: Secondary | ICD-10-CM | POA: Diagnosis not present

## 2018-01-07 DIAGNOSIS — N5201 Erectile dysfunction due to arterial insufficiency: Secondary | ICD-10-CM | POA: Diagnosis not present

## 2018-01-07 DIAGNOSIS — N401 Enlarged prostate with lower urinary tract symptoms: Secondary | ICD-10-CM | POA: Diagnosis not present

## 2018-01-07 DIAGNOSIS — R310 Gross hematuria: Secondary | ICD-10-CM | POA: Diagnosis not present

## 2018-01-07 DIAGNOSIS — R809 Proteinuria, unspecified: Secondary | ICD-10-CM | POA: Diagnosis not present

## 2018-01-15 ENCOUNTER — Other Ambulatory Visit: Payer: Self-pay | Admitting: *Deleted

## 2018-01-15 ENCOUNTER — Other Ambulatory Visit: Payer: Self-pay

## 2018-01-15 MED ORDER — SIMVASTATIN 40 MG PO TABS: 40.0000 mg | ORAL_TABLET | Freq: Every day | ORAL | 3 refills | Status: DC

## 2018-01-15 MED ORDER — INSULIN NPH ISOPHANE & REGULAR (70-30) 100 UNIT/ML ~~LOC~~ SUSP: SUBCUTANEOUS | 13 refills | Status: DC

## 2018-01-15 MED ORDER — OMEPRAZOLE 40 MG PO CPDR: 40.0000 mg | DELAYED_RELEASE_CAPSULE | Freq: Every day | ORAL | 1 refills | Status: DC

## 2018-01-15 MED ORDER — ONETOUCH ULTRASOFT LANCETS MISC: 5 refills | Status: AC

## 2018-01-15 MED ORDER — METOPROLOL SUCCINATE ER 25 MG PO TB24: 25.0000 mg | ORAL_TABLET | Freq: Every day | ORAL | 3 refills | Status: DC

## 2018-01-15 MED ORDER — LACTULOSE 10 GM/15ML PO SOLN: ORAL | 11 refills | Status: DC

## 2018-01-15 MED ORDER — LOSARTAN POTASSIUM-HCTZ 100-12.5 MG PO TABS: ORAL_TABLET | ORAL | 2 refills | Status: DC

## 2018-01-15 MED ORDER — TRAZODONE HCL 50 MG PO TABS: 50.0000 mg | ORAL_TABLET | Freq: Every day | ORAL | 11 refills | Status: DC

## 2018-02-03 DIAGNOSIS — N401 Enlarged prostate with lower urinary tract symptoms: Secondary | ICD-10-CM | POA: Diagnosis not present

## 2018-02-03 DIAGNOSIS — R972 Elevated prostate specific antigen [PSA]: Secondary | ICD-10-CM | POA: Diagnosis not present

## 2018-02-03 DIAGNOSIS — R3129 Other microscopic hematuria: Secondary | ICD-10-CM | POA: Diagnosis not present

## 2018-02-04 DIAGNOSIS — I10 Essential (primary) hypertension: Secondary | ICD-10-CM | POA: Diagnosis not present

## 2018-02-04 DIAGNOSIS — I2510 Atherosclerotic heart disease of native coronary artery without angina pectoris: Secondary | ICD-10-CM | POA: Diagnosis not present

## 2018-02-04 DIAGNOSIS — E782 Mixed hyperlipidemia: Secondary | ICD-10-CM | POA: Diagnosis not present

## 2018-02-10 DIAGNOSIS — N200 Calculus of kidney: Secondary | ICD-10-CM | POA: Diagnosis not present

## 2018-02-10 DIAGNOSIS — R3914 Feeling of incomplete bladder emptying: Secondary | ICD-10-CM | POA: Diagnosis not present

## 2018-02-10 DIAGNOSIS — N401 Enlarged prostate with lower urinary tract symptoms: Secondary | ICD-10-CM | POA: Diagnosis not present

## 2018-02-10 DIAGNOSIS — R972 Elevated prostate specific antigen [PSA]: Secondary | ICD-10-CM | POA: Diagnosis not present

## 2018-02-11 DIAGNOSIS — N3281 Overactive bladder: Secondary | ICD-10-CM | POA: Diagnosis not present

## 2018-02-11 DIAGNOSIS — R102 Pelvic and perineal pain: Secondary | ICD-10-CM | POA: Diagnosis not present

## 2018-02-11 DIAGNOSIS — R110 Nausea: Secondary | ICD-10-CM | POA: Diagnosis not present

## 2018-02-11 DIAGNOSIS — R112 Nausea with vomiting, unspecified: Secondary | ICD-10-CM | POA: Diagnosis not present

## 2018-03-17 DIAGNOSIS — R972 Elevated prostate specific antigen [PSA]: Secondary | ICD-10-CM | POA: Diagnosis not present

## 2018-03-17 DIAGNOSIS — N401 Enlarged prostate with lower urinary tract symptoms: Secondary | ICD-10-CM | POA: Diagnosis not present

## 2018-03-19 ENCOUNTER — Other Ambulatory Visit: Payer: Self-pay | Admitting: *Deleted

## 2018-03-19 MED ORDER — TRAZODONE HCL 50 MG PO TABS: 50.0000 mg | ORAL_TABLET | Freq: Every day | ORAL | 2 refills | Status: DC

## 2018-03-22 ENCOUNTER — Other Ambulatory Visit: Payer: Self-pay

## 2018-03-24 ENCOUNTER — Other Ambulatory Visit: Payer: Self-pay | Admitting: Endocrinology

## 2018-04-26 ENCOUNTER — Encounter: Payer: Self-pay | Admitting: Endocrinology

## 2018-04-26 ENCOUNTER — Ambulatory Visit: Payer: PPO | Admitting: Endocrinology

## 2018-04-26 VITALS — BP 120/64 | HR 87 | Wt 227.8 lb

## 2018-04-26 DIAGNOSIS — E11319 Type 2 diabetes mellitus with unspecified diabetic retinopathy without macular edema: Secondary | ICD-10-CM

## 2018-04-26 DIAGNOSIS — Z794 Long term (current) use of insulin: Secondary | ICD-10-CM

## 2018-04-26 LAB — POCT GLYCOSYLATED HEMOGLOBIN (HGB A1C): HEMOGLOBIN A1C: 7.3 % — AB (ref 4.0–5.6)

## 2018-04-26 NOTE — Patient Instructions (Addendum)
Please continue the same insulin.  However, if you are going to be active, take just 120 units in the morning.   check your blood sugar twice a day.  vary the time of day when you check, between before the 3 meals, and at bedtime.  also check if you have symptoms of your blood sugar being too high or too low.  please keep a record of the readings and bring it to your next appointment here (or you can bring the meter itself).  You can write it on any piece of paper.  please call us sooner if your blood sugar goes below 70, or if you have a lot of readings over 200.   Please come back for a regular physical appointment in 3 months.

## 2018-04-26 NOTE — Progress Notes (Signed)
Subjective:    Patient ID: [name], male    DOB: 02/25/1946, 72 y.o.   MRN: [id number]  HPI Pt returns for f/u of diabetes mellitus: DM type: Insulin-requiring type 2 Dx'ed: 2505 Complications: renal insuff and retinopathy.  Therapy: insulin since soon after dx.   DKA: never.  Severe hypoglycemia: never.   Pancreatitis: never.   Other: he is on a BID insulin schedule, due to noncompliance; he takes human insulin, due to cost.  He is retired.   Interval history: no cbg record, but states cbg's are well-controlled.  There is no trend throughout the day. He seldom has hypoglycemia, and these episodes are mild.  This happens with activity. Past Medical History:  Diagnosis Date  . Adenomatous colon polyp 06/2001  . ALLERGIC RHINITIS 08/03/2007  . BENIGN PROSTATIC HYPERTROPHY 08/03/2007  . 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 DIABETIC RETINOPATHY, BACKGROUND 09/29/2008  . DYSPHAGIA UNSPECIFIED 09/29/2008  . 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 GERD 11/28/2008  . HYPERLIPIDEMIA 08/03/2007  . 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 PAIN IN SOFT TISSUES OF LIMB 01/07/2008  . UNSPECIFIED ANEMIA 12/29/2007  . Unspecified essential hypertension 01/07/2008  . URINARY CALCULUS 09/29/2008    Past Surgical History:  Procedure Laterality Date  . CHOLECYSTECTOMY N/A 11/12/2016   Procedure: LAPAROSCOPIC CHOLECYSTECTOMY WITH INTRAOPERATIVE CHOLANGIOGRAM;  Surgeon: Clayburn Pert, MD;  Location: [facility] ORS;  Service: General;  Laterality: N/A;  . LITHOTRIPSY    . SHOULDER SURGERY     left, cyst and tumor removed    Social History   Socioeconomic History  . Marital status: Married    Spouse name: Not on file  . Number of children: 2  . Years of education: Not on file  . Highest education level: Not on file  Occupational History  . 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 Financial resource strain: Not on file  . Food insecurity:    Worry: Not on  file    Inability: Not on file  . Transportation needs:    Medical: Not on file    Non-medical: Not on file  Tobacco Use  . Smoking status: Never Smoker  . Smokeless tobacco: Never Used  Substance and Sexual Activity  . Alcohol use: Yes    Comment: occasional  . Drug use: No  . Sexual activity: Not on file  Lifestyle  . Physical activity:    Days per week: Not on file    Minutes per session: Not on file  . Stress: Not on file  Relationships  . Social connections:    Talks on phone: Not on file    Gets together: Not on file    Attends religious service: Not on file    Active member of club or organization: Not on file    Attends meetings of clubs or organizations: Not on file    Relationship status: Not on file  . Intimate partner violence:    Fear of current or ex partner: Not on file    Emotionally abused: Not on file    Physically abused: Not on file    Forced sexual activity: Not on file  Other Topics Concern  . Not on file  Social History Narrative   Daily Caffeine Use 1-2 daily    Current Outpatient Medications on File Prior to Visit  Medication Sig Dispense Refill  . aspirin 81 MG tablet Take 81 mg by mouth daily.    Marland Kitchen  Dutasteride-Tamsulosin HCl 0.5-0.4 MG CAPS Take 0.4 mg by mouth daily.    . insulin NPH-regular Human (NOVOLIN 70/30 RELION) (70-30) 100 UNIT/ML injection 150 units with breakfast, and 60 units with supper. 80 mL 13  . Insulin Pen Needle 31G X 5 MM MISC Use as directed 2 X daily. BD ULTRAPFINE MINI. Dx: 250.00 100 each 5  . lactulose (CHRONULAC) 10 GM/15ML solution TAKE 30 ML  ( 2 TABLESPOONSFUL) BY MOUTHTWICE A DAY 1892 mL 11  . Lancets (ONETOUCH ULTRASOFT) lancets Use as instructed 2x daily Dx: 250.00 100 each 5  . losartan-hydrochlorothiazide (HYZAAR) 100-12.5 MG tablet TAKE 1 TABLET BY MOUTH EVERY DAY 30 tablet 1  . MAGNESIUM PO Take 1 tablet by mouth daily.     . metoprolol succinate (TOPROL-XL) 25 MG 24 hr tablet Take 1 tablet (25 mg total) by  mouth daily. 90 tablet 3  . NON FORMULARY Prostate Peanut from [place].    Marland Kitchen omeprazole (PRILOSEC) 40 MG capsule Take 1 capsule (40 mg total) by mouth daily. 90 capsule 1  . ONE TOUCH ULTRA TEST test strip TEST TWICE DAILY 100 each 11  . simvastatin (ZOCOR) 40 MG tablet Take 1 tablet (40 mg total) by mouth at bedtime. 90 tablet 3  . traZODone (DESYREL) 50 MG tablet Take 1 tablet (50 mg total) by mouth at bedtime. (Patient not taking: Reported on 04/26/2018) 90 tablet 2  . vitamin E 400 UNIT capsule Take 400 Units by mouth daily.     No current facility-administered medications on file prior to visit.     Allergies  Allergen Reactions  . Sulfamethoxazole-Trimethoprim Rash    Family History  Problem Relation Age of Onset  . Diabetes Mother   . Prostate cancer Brother   . Diabetes Brother        x 3 brother  . Heart disease Brother     BP 120/64   Pulse 87   Wt 227 lb 12.8 oz (103.3 kg)   SpO2 93%   BMI 30.90 kg/m    Review of Systems Denies LOC    Objective:   Physical Exam VITAL SIGNS:  See vs page GENERAL: no distress Pulses: foot pulses are intact bilaterally.   MSK: no deformity of the feet or ankles.  CV: no edema of the legs or ankles Skin:  no ulcer on the feet or ankles.  normal color and temp on the feet and ankles Neuro: sensation is intact to touch on the feet and ankles.   Ext: There is bilateral onychomycosis of the toenails  Lab Results  Component Value Date   HGBA1C 7.3 (A) 04/26/2018   Lab Results  Component Value Date   CREATININE 1.29 08/25/2017   BUN 17 08/25/2017   NA 135 08/25/2017   K 4.2 08/25/2017   CL 101 08/25/2017   CO2 28 08/25/2017       Assessment & Plan:  Insulin-requiring type 2 DM, with DR: this is the best control this pt should aim for, given this regimen, which does match insulin to his changing needs throughout the day Hypoglycemia: he needs to adjust insulin for exercise.  Renal insuff: in this setting, he  needs most of his insulin in the morning.   Patient Instructions  Please continue the same insulin.  However, if you are going to be active, take just 120 units in the morning.   check your blood sugar twice a day.  vary the time of day when you check, between before the  3 meals, and at bedtime.  also check if you have symptoms of your blood sugar being too high or too low.  please keep a record of the readings and bring it to your next appointment here (or you can bring the meter itself).  You can write it on any piece of paper.  please call us sooner if your blood sugar goes below 70, or if you have a lot of readings over 200.   Please come back for a regular physical appointment in 3 months.

## 2018-05-23 ENCOUNTER — Other Ambulatory Visit: Payer: Self-pay | Admitting: Endocrinology

## 2018-05-24 ENCOUNTER — Encounter: Payer: Self-pay | Admitting: Family Medicine

## 2018-05-24 ENCOUNTER — Ambulatory Visit (INDEPENDENT_AMBULATORY_CARE_PROVIDER_SITE_OTHER): Payer: PPO | Admitting: Family Medicine

## 2018-05-24 VITALS — BP 132/80 | HR 103 | Temp 98.2°F | Resp 16 | Wt 223.0 lb

## 2018-05-24 DIAGNOSIS — J069 Acute upper respiratory infection, unspecified: Secondary | ICD-10-CM | POA: Diagnosis not present

## 2018-05-24 MED ORDER — HYDROCODONE-HOMATROPINE 5-1.5 MG/5ML PO SYRP: 5.0000 mL | ORAL_SOLUTION | Freq: Four times a day (QID) | ORAL | 0 refills | Status: AC | PRN

## 2018-05-24 NOTE — Patient Instructions (Signed)
Let me know if not continuing to improve over the course of the week.

## 2018-05-24 NOTE — Progress Notes (Signed)
  Subjective:     Patient ID: [name], male   DOB: 1946/09/06, 72 y.o.   MRN: [id number] Chief Complaint  Patient presents with  . Cough    Patient comes in office today with complaints of cough and congestion for one week. Pateint states that symptoms began after a trip to the beach, he has been taking otc Allegra and Nyquil.    HPI No documented fever or chills: "the cough is keeping me up at night."  Review of Systems     Objective:   Physical Exam  Constitutional: He appears well-developed and well-nourished. No distress.  Ears: T.M's intact without inflammation Sinuses: non-tender: drainage is clear Throat: no tonsillar enlargement or exudate Neck: no cervical adenopathy Lungs: clear     Assessment:    1. Viral upper respiratory tract infection - HYDROcodone-homatropine (HYCODAN) 5-1.5 MG/5ML syrup; Take 5 mLs by mouth every 6 (six) hours as needed for up to 5 days. 5 ml 4-6 hours as needed for cough  Dispense: 100 mL; Refill: 0    Plan:    To call if not improving over the course of the week.

## 2018-06-08 DIAGNOSIS — N401 Enlarged prostate with lower urinary tract symptoms: Secondary | ICD-10-CM | POA: Diagnosis not present

## 2018-06-15 DIAGNOSIS — R972 Elevated prostate specific antigen [PSA]: Secondary | ICD-10-CM | POA: Diagnosis not present

## 2018-06-15 DIAGNOSIS — N5201 Erectile dysfunction due to arterial insufficiency: Secondary | ICD-10-CM | POA: Diagnosis not present

## 2018-06-15 DIAGNOSIS — R351 Nocturia: Secondary | ICD-10-CM | POA: Diagnosis not present

## 2018-06-15 DIAGNOSIS — N401 Enlarged prostate with lower urinary tract symptoms: Secondary | ICD-10-CM | POA: Diagnosis not present

## 2018-07-20 ENCOUNTER — Other Ambulatory Visit: Payer: Self-pay | Admitting: Endocrinology

## 2018-07-28 ENCOUNTER — Telehealth: Payer: Self-pay | Admitting: Endocrinology

## 2018-07-28 ENCOUNTER — Ambulatory Visit (INDEPENDENT_AMBULATORY_CARE_PROVIDER_SITE_OTHER): Payer: PPO | Admitting: Endocrinology

## 2018-07-28 ENCOUNTER — Encounter: Payer: Self-pay | Admitting: Endocrinology

## 2018-07-28 VITALS — BP 144/90 | HR 75 | Wt 226.0 lb

## 2018-07-28 DIAGNOSIS — Z794 Long term (current) use of insulin: Secondary | ICD-10-CM

## 2018-07-28 DIAGNOSIS — E11319 Type 2 diabetes mellitus with unspecified diabetic retinopathy without macular edema: Secondary | ICD-10-CM

## 2018-07-28 DIAGNOSIS — H47013 Ischemic optic neuropathy, bilateral: Secondary | ICD-10-CM

## 2018-07-28 LAB — POCT GLYCOSYLATED HEMOGLOBIN (HGB A1C): HEMOGLOBIN A1C: 7.3 % — AB (ref 4.0–5.6)

## 2018-07-28 NOTE — Progress Notes (Signed)
Subjective:    Patient ID: [name], male    DOB: 06-May-1946, 72 y.o.   MRN: [id number]  HPI Pt returns for f/u of diabetes mellitus: DM type: Insulin-requiring type 2 Dx'ed: 4081 Complications: renal insuff and retinopathy.  Therapy: insulin since soon after dx.   DKA: never.  Severe hypoglycemia: never.   Pancreatitis: never.   Other: he is on a BID insulin schedule, due to noncompliance; he takes human insulin, due to cost.  He is retired.   Interval history: no cbg record, but states cbg's are well-controlled.  There is no trend throughout the day. He seldom has hypoglycemia, and these episodes are mild.  This happens with activity.   BP was normal at last ov. We have received a report from optometrist that pt recently had retinal exam c/w carotid stenosis. Past Medical History:  Diagnosis Date  . Adenomatous colon polyp 06/2001  . ALLERGIC RHINITIS 08/03/2007  . BENIGN PROSTATIC HYPERTROPHY 08/03/2007  . 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 DIABETIC RETINOPATHY, BACKGROUND 09/29/2008  . DYSPHAGIA UNSPECIFIED 09/29/2008  . 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 GERD 11/28/2008  . HYPERLIPIDEMIA 08/03/2007  . 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 PAIN IN SOFT TISSUES OF LIMB 01/07/2008  . UNSPECIFIED ANEMIA 12/29/2007  . Unspecified essential hypertension 01/07/2008  . URINARY CALCULUS 09/29/2008    Past Surgical History:  Procedure Laterality Date  . CHOLECYSTECTOMY N/A 11/12/2016   Procedure: LAPAROSCOPIC CHOLECYSTECTOMY WITH INTRAOPERATIVE CHOLANGIOGRAM;  Surgeon: Clayburn Pert, MD;  Location: [facility] ORS;  Service: General;  Laterality: N/A;  . LITHOTRIPSY    . SHOULDER SURGERY     left, cyst and tumor removed    Social History   Socioeconomic History  . Marital status: Married    Spouse name: Not on file  . Number of children: 2  . Years of education: Not on file  . Highest education level: Not on file  Occupational History  . Occupation: Metal  Fabrication    Employer: [employer]  Social Needs  . Financial resource strain: Not on file  . Food insecurity:    Worry: Not on file    Inability: Not on file  . Transportation needs:    Medical: Not on file    Non-medical: Not on file  Tobacco Use  . Smoking status: Never Smoker  . Smokeless tobacco: Never Used  Substance and Sexual Activity  . Alcohol use: Yes    Comment: occasional  . Drug use: No  . Sexual activity: Not on file  Lifestyle  . Physical activity:    Days per week: Not on file    Minutes per session: Not on file  . Stress: Not on file  Relationships  . Social connections:    Talks on phone: Not on file    Gets together: Not on file    Attends religious service: Not on file    Active member of club or organization: Not on file    Attends meetings of clubs or organizations: Not on file    Relationship status: Not on file  . Intimate partner violence:    Fear of current or ex partner: Not on file    Emotionally abused: Not on file    Physically abused: Not on file    Forced sexual activity: Not on file  Other Topics Concern  . Not on file  Social History Narrative   Daily Caffeine Use 1-2 daily    Current Outpatient Medications on File  Prior to Visit  Medication Sig Dispense Refill  . aspirin 81 MG tablet Take 81 mg by mouth daily.    . Dutasteride-Tamsulosin HCl 0.5-0.4 MG CAPS Take 0.4 mg by mouth daily.    . insulin NPH-regular Human (NOVOLIN 70/30 RELION) (70-30) 100 UNIT/ML injection 150 units with breakfast, and 60 units with supper. 80 mL 13  . Insulin Pen Needle 31G X 5 MM MISC Use as directed 2 X daily. BD ULTRAPFINE MINI. Dx: 250.00 100 each 5  . lactulose (CHRONULAC) 10 GM/15ML solution TAKE 30 ML  ( 2 TABLESPOONSFUL) BY MOUTHTWICE A DAY 1892 mL 11  . Lancets (ONETOUCH ULTRASOFT) lancets Use as instructed 2x daily Dx: 250.00 100 each 5  . losartan-hydrochlorothiazide (HYZAAR) 100-12.5 MG tablet TAKE 1 TABLET BY MOUTH EVERY DAY 30 tablet 1  .  MAGNESIUM PO Take 1 tablet by mouth daily.     . metoprolol succinate (TOPROL-XL) 25 MG 24 hr tablet Take 1 tablet (25 mg total) by mouth daily. 90 tablet 3  . NON FORMULARY Prostate Peanut from [place].    Marland Kitchen omeprazole (PRILOSEC) 40 MG capsule Take 1 capsule (40 mg total) by mouth daily. 90 capsule 1  . ONE TOUCH ULTRA TEST test strip TEST TWICE DAILY 100 each 11  . simvastatin (ZOCOR) 40 MG tablet Take 1 tablet (40 mg total) by mouth at bedtime. 90 tablet 3  . traZODone (DESYREL) 50 MG tablet Take 1 tablet (50 mg total) by mouth at bedtime. (Patient not taking: Reported on 05/24/2018) 90 tablet 2  . vitamin E 400 UNIT capsule Take 400 Units by mouth daily.     No current facility-administered medications on file prior to visit.     Allergies  Allergen Reactions  . Sulfamethoxazole-Trimethoprim Rash    Family History  Problem Relation Age of Onset  . Diabetes Mother   . Prostate cancer Brother   . Diabetes Brother        x 3 brother  . Heart disease Brother     BP (!) 144/90 (BP Location: Right Arm, Patient Position: Sitting, Cuff Size: Normal)   Pulse 75   Wt 226 lb (102.5 kg)   SpO2 97%   BMI 30.65 kg/m   Review of Systems He denies LOC    Objective:   Physical Exam VITAL SIGNS:  See vs page GENERAL: no distress Pulses: foot pulses are intact bilaterally.   MSK: no deformity of the feet or ankles.  CV: no edema of the legs or ankles Skin:  no ulcer on the feet or ankles.  normal color and temp on the feet and ankles Neuro: sensation is intact to touch on the feet and ankles.   Ext: There is bilateral onychomycosis of the toenails.    Lab Results  Component Value Date   HGBA1C 7.3 (A) 07/28/2018       Assessment & Plan:  Insulin-requiring type 2 DM: this is the best control this pt should aim for, given this regimen, which does match insulin to his changing needs throughout the day Retinal abnormality, new: uncertain etiology.  Check carotid US HTN:  with probable situational component.   Patient Instructions  Please continue the same insulin.  However, if you are going to be active, take just 120 units in the morning.   Please continue the same medication for blood pressure, for now.   check your blood sugar twice a day.  vary the time of day when you check, between before the 3 meals,  and at bedtime.  also check if you have symptoms of your blood sugar being too high or too low.  please keep a record of the readings and bring it to your next appointment here (or you can bring the meter itself).  You can write it on any piece of paper.  please call us sooner if your blood sugar goes below 70, or if you have a lot of readings over 200.   Please come back for a regular physical appointment in 1 month.

## 2018-07-28 NOTE — Patient Instructions (Addendum)
Please continue the same insulin.  However, if you are going to be active, take just 120 units in the morning.   Please continue the same medication for blood pressure, for now.   check your blood sugar twice a day.  vary the time of day when you check, between before the 3 meals, and at bedtime.  also check if you have symptoms of your blood sugar being too high or too low.  please keep a record of the readings and bring it to your next appointment here (or you can bring the meter itself).  You can write it on any piece of paper.  please call us sooner if your blood sugar goes below 70, or if you have a lot of readings over 200.   Please come back for a regular physical appointment in 1 month.

## 2018-07-28 NOTE — Telephone Encounter (Signed)
please call patient: We received papers from eye dr.  We should check the carotid ultrasound test.  It is easy and painless.  you will receive a phone call, about a day and time for an appointment

## 2018-07-28 NOTE — Telephone Encounter (Signed)
Pt stated that vein and vascular have already called and he's scheduled tomorrow morning

## 2018-07-29 ENCOUNTER — Ambulatory Visit (HOSPITAL_COMMUNITY)
Admission: RE | Admit: 2018-07-29 | Discharge: 2018-07-29 | Disposition: A | Discharge status: Home / Self Care | Payer: PPO | Source: Ambulatory Visit | Attending: Family | Admitting: Family

## 2018-07-29 DIAGNOSIS — E119 Type 2 diabetes mellitus without complications: Secondary | ICD-10-CM | POA: Insufficient documentation

## 2018-07-29 DIAGNOSIS — H47013 Ischemic optic neuropathy, bilateral: Secondary | ICD-10-CM | POA: Insufficient documentation

## 2018-07-29 DIAGNOSIS — I2510 Atherosclerotic heart disease of native coronary artery without angina pectoris: Secondary | ICD-10-CM | POA: Insufficient documentation

## 2018-07-29 DIAGNOSIS — I10 Essential (primary) hypertension: Secondary | ICD-10-CM | POA: Diagnosis not present

## 2018-08-31 ENCOUNTER — Ambulatory Visit (INDEPENDENT_AMBULATORY_CARE_PROVIDER_SITE_OTHER): Payer: PPO | Admitting: Endocrinology

## 2018-08-31 ENCOUNTER — Encounter: Payer: Self-pay | Admitting: Endocrinology

## 2018-08-31 VITALS — BP 124/62 | HR 78 | Ht 72.0 in | Wt 229.2 lb

## 2018-08-31 DIAGNOSIS — Z794 Long term (current) use of insulin: Secondary | ICD-10-CM

## 2018-08-31 DIAGNOSIS — Z0000 Encounter for general adult medical examination without abnormal findings: Secondary | ICD-10-CM | POA: Diagnosis not present

## 2018-08-31 DIAGNOSIS — N201 Calculus of ureter: Secondary | ICD-10-CM

## 2018-08-31 DIAGNOSIS — D649 Anemia, unspecified: Secondary | ICD-10-CM

## 2018-08-31 DIAGNOSIS — E11319 Type 2 diabetes mellitus with unspecified diabetic retinopathy without macular edema: Secondary | ICD-10-CM

## 2018-08-31 DIAGNOSIS — R319 Hematuria, unspecified: Secondary | ICD-10-CM | POA: Diagnosis not present

## 2018-08-31 DIAGNOSIS — Z23 Encounter for immunization: Secondary | ICD-10-CM

## 2018-08-31 LAB — BASIC METABOLIC PANEL
BUN: 21 mg/dL (ref 6–23)
CHLORIDE: 100 meq/L (ref 96–112)
CO2: 29 mEq/L (ref 19–32)
Calcium: 9.8 mg/dL (ref 8.4–10.5)
Creatinine, Ser: 1.53 mg/dL — ABNORMAL HIGH (ref 0.40–1.50)
GFR: 48.02 mL/min — AB (ref >60.00)
Glucose, Bld: 176 mg/dL — ABNORMAL HIGH (ref 70–99)
POTASSIUM: 4.4 meq/L (ref 3.5–5.1)
Sodium: 136 mEq/L (ref 135–145)

## 2018-08-31 LAB — LDL CHOLESTEROL, DIRECT: LDL DIRECT: 46 mg/dL

## 2018-08-31 LAB — CBC WITH DIFFERENTIAL/PLATELET
BASOS PCT: 1 % (ref 0.0–3.0)
Basophils Absolute: 0.1 10*3/uL (ref 0.0–0.1)
EOS PCT: 7.9 % — AB (ref 0.0–5.0)
Eosinophils Absolute: 0.5 10*3/uL (ref 0.0–0.7)
HEMATOCRIT: 48.8 % (ref 39.0–52.0)
HEMOGLOBIN: 16.5 g/dL (ref 13.0–17.0)
LYMPHS PCT: 35 % (ref 12.0–46.0)
Lymphs Abs: 2 10*3/uL (ref 0.7–4.0)
MCHC: 33.7 g/dL (ref 30.0–36.0)
MCV: 95.7 fl (ref 78.0–100.0)
MONO ABS: 0.5 10*3/uL (ref 0.1–1.0)
Monocytes Relative: 9.4 % (ref 3.0–12.0)
NEUTROS ABS: 2.7 10*3/uL (ref 1.4–7.7)
NEUTROS PCT: 46.7 % (ref 43.0–77.0)
PLATELETS: 205 10*3/uL (ref 150.0–400.0)
RBC: 5.1 Mil/uL (ref 4.22–5.81)
RDW: 14.3 % (ref 11.5–15.5)
WBC: 5.8 10*3/uL (ref 4.0–10.5)

## 2018-08-31 LAB — LIPID PANEL
CHOLESTEROL: 103 mg/dL (ref 0–200)
HDL: 31.2 mg/dL — ABNORMAL LOW (ref >39.00)
NonHDL: 71.73
TRIGLYCERIDES: 369 mg/dL — AB (ref 0.0–149.0)
Total CHOL/HDL Ratio: 3
VLDL: 73.8 mg/dL — ABNORMAL HIGH (ref 0.0–40.0)

## 2018-08-31 LAB — MICROALBUMIN / CREATININE URINE RATIO
CREATININE, U: 145 mg/dL
MICROALB/CREAT RATIO: 1.8 mg/g (ref 0.0–30.0)
Microalb, Ur: 2.6 mg/dL — ABNORMAL HIGH (ref 0.0–1.9)

## 2018-08-31 LAB — HEPATIC FUNCTION PANEL
ALT: 28 U/L (ref 0–53)
AST: 25 U/L (ref 0–37)
Albumin: 4.2 g/dL (ref 3.5–5.2)
Alkaline Phosphatase: 53 U/L (ref 39–117)
BILIRUBIN DIRECT: 0.1 mg/dL (ref 0.0–0.3)
BILIRUBIN TOTAL: 0.7 mg/dL (ref 0.2–1.2)
Total Protein: 7.3 g/dL (ref 6.0–8.3)

## 2018-08-31 LAB — IBC PANEL
Iron: 98 ug/dL (ref 42–165)
SATURATION RATIOS: 23 % (ref 20.0–50.0)
TRANSFERRIN: 305 mg/dL (ref 212.0–360.0)

## 2018-08-31 LAB — TSH: TSH: 1.04 u[IU]/mL (ref 0.35–4.50)

## 2018-08-31 NOTE — Patient Instructions (Addendum)
good diet and exercise significantly improve your health.  please let me know if you wish to be referred to a dietician.  high blood sugar is very risky to your health.  you should see an eye doctor and dentist every year.  It is very important to get all recommended vaccinations.  Please consider these measures for your health:  minimize alcohol.  Do not use tobacco products.  Have a colonoscopy at least every 10 years from age 70.  Keep firearms safely stored.  Always use seat belts.  have working smoke alarms in your home.  See an eye doctor and dentist regularly.  Never drive under the influence of alcohol or drugs (including prescription drugs).  Those with fair skin should take precautions against the sun, and should carefully examine their skin once per month, for any new or changed moles.  It is critically important to prevent falling down (keep floor areas well-lit, dry, and free of loose objects.  If you have a cane, walker, or wheelchair, you should use it, even for short trips around the house.  Wear flat-soled shoes.  Also, try not to rush). Please come back for a follow-up appointment in 3 months.

## 2018-08-31 NOTE — Progress Notes (Signed)
Subjective:    Patient ID: [name], male    DOB: 1946/07/29, 72 y.o.   MRN: [id number]  HPI Pt is here for regular wellness examination, and is feeling pretty well in general, and says chronic med probs are stable, except as noted below Past Medical History:  Diagnosis Date  . Adenomatous colon polyp 06/2001  . ALLERGIC RHINITIS 08/03/2007  . BENIGN PROSTATIC HYPERTROPHY 08/03/2007  . 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 DIABETIC RETINOPATHY, BACKGROUND 09/29/2008  . DYSPHAGIA UNSPECIFIED 09/29/2008  . 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 GERD 11/28/2008  . HYPERLIPIDEMIA 08/03/2007  . 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 PAIN IN SOFT TISSUES OF LIMB 01/07/2008  . UNSPECIFIED ANEMIA 12/29/2007  . Unspecified essential hypertension 01/07/2008  . URINARY CALCULUS 09/29/2008    Past Surgical History:  Procedure Laterality Date  . CHOLECYSTECTOMY N/A 11/12/2016   Procedure: LAPAROSCOPIC CHOLECYSTECTOMY WITH INTRAOPERATIVE CHOLANGIOGRAM;  Surgeon: Clayburn Pert, MD;  Location: [facility] ORS;  Service: General;  Laterality: N/A;  . LITHOTRIPSY    . SHOULDER SURGERY     left, cyst and tumor removed    Social History   Socioeconomic History  . Marital status: Married    Spouse name: Not on file  . Number of children: 2  . Years of education: Not on file  . Highest education level: Not on file  Occupational History  . 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 Financial resource strain: Not on file  . Food insecurity:    Worry: Not on file    Inability: Not on file  . Transportation needs:    Medical: Not on file    Non-medical: Not on file  Tobacco Use  . Smoking status: Never Smoker  . Smokeless tobacco: Never Used  Substance and Sexual Activity  . Alcohol use: Yes    Comment: occasional  . Drug use: No  . Sexual activity: Not on file  Lifestyle  . Physical activity:    Days per week: Not on file    Minutes per session: Not  on file  . Stress: Not on file  Relationships  . Social connections:    Talks on phone: Not on file    Gets together: Not on file    Attends religious service: Not on file    Active member of club or organization: Not on file    Attends meetings of clubs or organizations: Not on file    Relationship status: Not on file  . Intimate partner violence:    Fear of current or ex partner: Not on file    Emotionally abused: Not on file    Physically abused: Not on file    Forced sexual activity: Not on file  Other Topics Concern  . Not on file  Social History Narrative   Daily Caffeine Use 1-2 daily    Current Outpatient Medications on File Prior to Visit  Medication Sig Dispense Refill  . aspirin 81 MG tablet Take 81 mg by mouth daily.    . Dutasteride-Tamsulosin HCl 0.5-0.4 MG CAPS Take 0.4 mg by mouth daily.    . insulin NPH-regular Human (NOVOLIN 70/30 RELION) (70-30) 100 UNIT/ML injection 150 units with breakfast, and 60 units with supper. 80 mL 13  . Insulin Pen Needle 31G X 5 MM MISC Use as directed 2 X daily. BD ULTRAPFINE MINI. Dx: 250.00 100 each 5  . lactulose (CHRONULAC) 10 GM/15ML solution TAKE 30 ML  (  2 TABLESPOONSFUL) BY MOUTHTWICE A DAY 1892 mL 11  . Lancets (ONETOUCH ULTRASOFT) lancets Use as instructed 2x daily Dx: 250.00 100 each 5  . losartan-hydrochlorothiazide (HYZAAR) 100-12.5 MG tablet TAKE 1 TABLET BY MOUTH EVERY DAY 30 tablet 1  . MAGNESIUM PO Take 1 tablet by mouth daily.     . metoprolol succinate (TOPROL-XL) 25 MG 24 hr tablet Take 1 tablet (25 mg total) by mouth daily. 90 tablet 3  . NON FORMULARY Prostate Peanut from [place].    Marland Kitchen omeprazole (PRILOSEC) 40 MG capsule Take 1 capsule (40 mg total) by mouth daily. 90 capsule 1  . ONE TOUCH ULTRA TEST test strip TEST TWICE DAILY 100 each 11  . simvastatin (ZOCOR) 40 MG tablet Take 1 tablet (40 mg total) by mouth at bedtime. 90 tablet 3  . traZODone (DESYREL) 50 MG tablet Take 1 tablet (50 mg total) by  mouth at bedtime. 90 tablet 2  . vitamin E 400 UNIT capsule Take 400 Units by mouth daily.     No current facility-administered medications on file prior to visit.     Allergies  Allergen Reactions  . Sulfamethoxazole-Trimethoprim Rash    Family History  Problem Relation Age of Onset  . Diabetes Mother   . Prostate cancer Brother   . Diabetes Brother        x 3 brother  . Heart disease Brother     BP 124/62 (BP Location: Right Arm)   Pulse 78   Ht 6' (1.829 m)   Wt 229 lb 3.2 oz (104 kg)   SpO2 97%   BMI 31.09 kg/m     Review of Systems Denies fever, fatigue, diplopia, earache, chest pain, sob, back pain, cold intolerance, BRBPR, hematuria, syncope, numbness, easy bruising, and rash.  He has occasional insomnia and rhinorrhea     Objective:   Physical Exam VS: see vs page GEN: no distress HEAD: head: no deformity eyes: no periorbital swelling, no proptosis external nose and ears are normal mouth: no lesion seen NECK: supple, thyroid is not enlarged CHEST WALL: no deformity LUNGS: clear to auscultation CV: reg rate and rhythm, no murmur ABD: abdomen is soft, nontender.  no hepatosplenomegaly.  not distended.  Self-reducing ventral hernia MUSCULOSKELETAL: muscle bulk and strength are grossly normal.  no obvious joint swelling.  gait is normal and steady EXTEMITIES: no deformity.  no ulcer on the feet.  feet are of normal color and temp.  Trace bilat leg edema.  There is bilateral onychomycosis of the toenails.  PULSES: dorsalis pedis intact bilat.  no carotid bruit NEURO:  cn 2-12 grossly intact.   readily moves all 4's.  sensation is intact to touch on the feet SKIN:  Normal texture and temperature.  No rash or suspicious lesion is visible.   NODES:  None palpable at the neck PSYCH: alert, well-oriented.  Does not appear anxious nor depressed.    ecg machine is not working     Assessment & Plan:  Wellness visit today, with problems stable, except as  noted.   Subjective:   Patient here for Medicare annual wellness visit and management of other chronic and acute problems.     Risk factors: advanced age    38 of Physicians Providing Medical Care to Patient:  See "snapshot"   Activities of Daily Living: In your present state of health, do you have any difficulty performing the following activities (lives with wife)?:  Preparing food and eating?: No  Bathing yourself: No  Getting dressed: No  Using the toilet:No  Moving around from place to place: No  In the past year have you fallen or had a near fall?: No    Home Safety: Has smoke detector and wears seat belts. Firearms are safely stored. No excess sun exposure.  Opioid Use: none   Diet and Exercise  Current exercise habits: pt says fair. Dietary issues discussed: pt reports a fairly healthy diet   Depression Screen  Q1: Over the past two weeks, have you felt down, depressed or hopeless?no  Q2: Over the past two weeks, have you felt little interest or pleasure in doing things? no   The following portions of the patient's history were reviewed and updated as appropriate: allergies, current medications, past family history, past medical history, past social history, past surgical history and problem list.   Review of Systems  No change in chronic hearing or visual losses.  Glasses help.  Declines HA's Objective:   Vision:  See [facility] done today Hearing: grossly normal Body mass index:  See vs page Msk: pt easily and quickly performs "get-up-and-go" from a sitting position Cognitive Impairment Assessment: cognition, memory and judgment appear normal.  remembers 3/3 at 5 minutes.  excellent recall.  can easily read and write a sentence.  alert and oriented x 3   Assessment:   Medicare wellness utd on preventive parameters    Plan:   During the course of the visit the patient was educated and counseled about appropriate screening and preventive services including:         Fall prevention is advised today  Diabetes screening is UTD Nutrition counseling is offered  advanced directives/end of life addressed today:  see healthcare directives hyperlink  Vaccines are updated as needed  Patient Instructions (the written plan) was given to the patient.

## 2018-08-31 NOTE — Progress Notes (Signed)
we discussed code status.  pt requests full code, but would not want to be started or maintained on artificial life-support measures if there was not a reasonable chance of recovery 

## 2018-09-16 ENCOUNTER — Other Ambulatory Visit: Payer: Self-pay | Admitting: Endocrinology

## 2018-09-16 NOTE — Telephone Encounter (Signed)
Please address refill request 

## 2018-10-17 ENCOUNTER — Other Ambulatory Visit: Payer: Self-pay | Admitting: Endocrinology

## 2018-10-17 NOTE — Telephone Encounter (Signed)
Please refill x 3 months Further refills would have to be considered by new PCP   

## 2018-11-08 DIAGNOSIS — I2510 Atherosclerotic heart disease of native coronary artery without angina pectoris: Secondary | ICD-10-CM | POA: Insufficient documentation

## 2018-11-08 DIAGNOSIS — Z87442 Personal history of urinary calculi: Secondary | ICD-10-CM | POA: Insufficient documentation

## 2018-11-09 ENCOUNTER — Ambulatory Visit (INDEPENDENT_AMBULATORY_CARE_PROVIDER_SITE_OTHER): Payer: PPO | Admitting: Internal Medicine

## 2018-11-09 ENCOUNTER — Encounter: Payer: Self-pay | Admitting: Internal Medicine

## 2018-11-09 ENCOUNTER — Other Ambulatory Visit: Payer: Self-pay | Admitting: Internal Medicine

## 2018-11-09 VITALS — BP 150/70 | HR 81 | Temp 98.3°F | Ht 72.0 in | Wt 233.6 lb

## 2018-11-09 DIAGNOSIS — K760 Fatty (change of) liver, not elsewhere classified: Secondary | ICD-10-CM | POA: Diagnosis not present

## 2018-11-09 DIAGNOSIS — I10 Essential (primary) hypertension: Secondary | ICD-10-CM

## 2018-11-09 DIAGNOSIS — E785 Hyperlipidemia, unspecified: Secondary | ICD-10-CM

## 2018-11-09 DIAGNOSIS — Z794 Long term (current) use of insulin: Secondary | ICD-10-CM

## 2018-11-09 DIAGNOSIS — R5383 Other fatigue: Secondary | ICD-10-CM

## 2018-11-09 DIAGNOSIS — K5900 Constipation, unspecified: Secondary | ICD-10-CM

## 2018-11-09 DIAGNOSIS — I6522 Occlusion and stenosis of left carotid artery: Secondary | ICD-10-CM | POA: Diagnosis not present

## 2018-11-09 DIAGNOSIS — E291 Testicular hypofunction: Secondary | ICD-10-CM

## 2018-11-09 DIAGNOSIS — K5909 Other constipation: Secondary | ICD-10-CM

## 2018-11-09 DIAGNOSIS — Z1211 Encounter for screening for malignant neoplasm of colon: Secondary | ICD-10-CM | POA: Diagnosis not present

## 2018-11-09 DIAGNOSIS — E11319 Type 2 diabetes mellitus with unspecified diabetic retinopathy without macular edema: Secondary | ICD-10-CM

## 2018-11-09 DIAGNOSIS — Z1329 Encounter for screening for other suspected endocrine disorder: Secondary | ICD-10-CM | POA: Diagnosis not present

## 2018-11-09 DIAGNOSIS — E559 Vitamin D deficiency, unspecified: Secondary | ICD-10-CM

## 2018-11-09 DIAGNOSIS — I2510 Atherosclerotic heart disease of native coronary artery without angina pectoris: Secondary | ICD-10-CM | POA: Diagnosis not present

## 2018-11-09 DIAGNOSIS — E538 Deficiency of other specified B group vitamins: Secondary | ICD-10-CM

## 2018-11-09 DIAGNOSIS — R079 Chest pain, unspecified: Secondary | ICD-10-CM | POA: Diagnosis not present

## 2018-11-09 DIAGNOSIS — Z87442 Personal history of urinary calculi: Secondary | ICD-10-CM | POA: Diagnosis not present

## 2018-11-09 DIAGNOSIS — N400 Enlarged prostate without lower urinary tract symptoms: Secondary | ICD-10-CM

## 2018-11-09 DIAGNOSIS — Z1283 Encounter for screening for malignant neoplasm of skin: Secondary | ICD-10-CM

## 2018-11-09 MED ORDER — METOPROLOL SUCCINATE ER 50 MG PO TB24: 50.0000 mg | ORAL_TABLET | Freq: Every day | ORAL | 1 refills | Status: DC

## 2018-11-09 NOTE — Patient Instructions (Addendum)
Miralax over the counter laxative  Colace over the counter stool softner  Combination Senna-colace (laxative and stool softner)    Call me back about cardiology if you want a referral Dr. Rockey Situ, Dr. Saunders Revel, Dr. Fletcher Anon   Consider Shingrix vaccine in the future   Try Linzess for constipation   Recombinant Zoster (Shingles) Vaccine, RZV: What You Need to Know 1. Why get vaccinated? Shingles (also called herpes zoster, or just zoster) is a painful skin rash, often with blisters. Shingles is caused by the varicella zoster virus, the same virus that causes chickenpox. After you have chickenpox, the virus stays in your body and can cause shingles later in life. You can't catch shingles from another person. However, a person who has never had chickenpox (or chickenpox vaccine) could get chickenpox from someone with shingles. A shingles rash usually appears on one side of the face or body and heals within 2 to 4 weeks. Its main symptom is pain, which can be severe. Other symptoms can include fever, headache, chills and upset stomach. Very rarely, a shingles infection can lead to pneumonia, hearing problems, blindness, brain inflammation (encephalitis), or death. For about 1 person in 5, severe pain can continue even long after the rash has cleared up. This long-lasting pain is called post-herpetic neuralgia (PHN). Shingles is far more common in people 45 years of age and older than in younger people, and the risk increases with age. It is also more common in people whose immune system is weakened because of a disease such as cancer, or by drugs such as steroids or chemotherapy. At least 1 million people a year in the [place] get shingles. 2. Shingles vaccine (recombinant) Recombinant shingles vaccine was approved by FDA in 2017 for the prevention of shingles. In clinical trials, it was more than 90% effective in preventing shingles. It can also reduce the likelihood of PHN. Two doses, 2 to 6 months  apart, are recommended for adults 22 and older. This vaccine is also recommended for people who have already gotten the live shingles vaccine (Zostavax). There is no live virus in this vaccine. 3. Some people should not get this vaccine Tell your vaccine provider if you:  Have any severe, life-threatening allergies. A person who has ever had a life-threatening allergic reaction after a dose of recombinant shingles vaccine, or has a severe allergy to any component of this vaccine, may be advised not to be vaccinated. Ask your health care provider if you want information about vaccine components.  Are pregnant or breastfeeding. There is not much information about use of recombinant shingles vaccine in pregnant or nursing women. Your healthcare provider might recommend delaying vaccination.  Are not feeling well. If you have a mild illness, such as a cold, you can probably get the vaccine today. If you are moderately or severely ill, you should probably wait until you recover. Your doctor can advise you.  4. Risks of a vaccine reaction With any medicine, including vaccines, there is a chance of reactions. After recombinant shingles vaccination, a person might experience:  Pain, redness, soreness, or swelling at the site of the injection  Headache, muscle aches, fever, shivering, fatigue  In clinical trials, most people got a sore arm with mild or moderate pain after vaccination, and some also had redness and swelling where they got the shot. Some people felt tired, had muscle pain, a headache, shivering, fever, stomach pain, or nausea. About 1 out of 6 people who got recombinant zoster vaccine experienced side  effects that prevented them from doing regular activities. Symptoms went away on their own in about 2 to 3 days. Side effects were more common in younger people. You should still get the second dose of recombinant zoster vaccine even if you had one of these reactions after the first  dose. Other things that could happen after this vaccine:  People sometimes faint after medical procedures, including vaccination. Sitting or lying down for about 15 minutes can help prevent fainting and injuries caused by a fall. Tell your provider if you feel dizzy or have vision changes or ringing in the ears.  Some people get shoulder pain that can be more severe and longer-lasting than routine soreness that can follow injections. This happens very rarely.  Any medication can cause a severe allergic reaction. Such reactions to a vaccine are estimated at about 1 in a million doses, and would happen within a few minutes to a few hours after the vaccination. As with any medicine, there is a very remote chance of a vaccine causing a serious injury or death. The safety of vaccines is always being monitored. For more information, visit: http://www.aguilar.org/ 5. What if there is a serious problem? What should I look for?  Look for anything that concerns you, such as signs of a severe allergic reaction, very high fever, or unusual behavior. Signs of a severe allergic reaction can include hives, swelling of the face and throat, difficulty breathing, a fast heartbeat, dizziness, and weakness. These would usually start a few minutes to a few hours after the vaccination. What should I do?  If you think it is a severe allergic reaction or other emergency that can't wait, call 9-1-1 and get to the nearest hospital. Otherwise, call your health care provider. Afterward, the reaction should be reported to the Vaccine Adverse Event Reporting System (VAERS). Your doctor should file this report, or you can do it yourself through the VAERS web site atwww.vaers.https://www.bray.com/ by calling 267-002-1998. VAERS does not give medical advice. 6. How can I learn more?  Ask your healthcare provider. He or she can give you the vaccine package insert or suggest other sources of information.  Call your local or state  health department.  Contact the Centers for Disease Control and Prevention (CDC): ? Call (760)151-7978 (1-800-CDC-INFO) or ? Visit the CDC's website at http://hunter.com/ CDC Vaccine Information Statement (VIS) Recombinant Zoster Vaccine (01/05/2017) This information is not intended to replace advice given to you by your health care provider. Make sure you discuss any questions you have with your health care provider. Document Released: 01/20/2017 Document Revised: 01/20/2017 Document Reviewed: 01/20/2017 Elsevier Interactive Patient Education  2018 [employer].   Constipation, Adult Constipation is when a person has fewer bowel movements in a week than normal, has difficulty having a bowel movement, or has stools that are dry, hard, or larger than normal. Constipation may be caused by an underlying condition. It may become worse with age if a person takes certain medicines and does not take in enough fluids. Follow these instructions at home: Eating and drinking   Eat foods that have a lot of fiber, such as fresh fruits and vegetables, whole grains, and beans.  Limit foods that are high in fat, low in fiber, or overly processed, such as french fries, hamburgers, cookies, candies, and soda.  Drink enough fluid to keep your urine clear or pale yellow. General instructions  Exercise regularly or as told by your health care provider.  Go to the restroom when you  have the urge to go. Do not hold it in.  Take over-the-counter and prescription medicines only as told by your health care provider. These include any fiber supplements.  Practice pelvic floor retraining exercises, such as deep breathing while relaxing the lower abdomen and pelvic floor relaxation during bowel movements.  Watch your condition for any changes.  Keep all follow-up visits as told by your health care provider. This is important. Contact a health care provider if:  You have pain that gets worse.  You have  a fever.  You do not have a bowel movement after 4 days.  You vomit.  You are not hungry.  You lose weight.  You are bleeding from the anus.  You have thin, pencil-like stools. Get help right away if:  You have a fever and your symptoms suddenly get worse.  You leak stool or have blood in your stool.  Your abdomen is bloated.  You have severe pain in your abdomen.  You feel dizzy or you faint. This information is not intended to replace advice given to you by your health care provider. Make sure you discuss any questions you have with your health care provider. Document Released: 08/08/2004 Document Revised: 05/30/2016 Document Reviewed: 04/30/2016 Elsevier Interactive Patient Education  2018 [employer].    Coronary Artery Disease, Male Coronary artery disease (CAD) is a condition in which the arteries that lead to the heart (coronary arteries) become narrow or blocked. The narrowing or blockage can lead to decreased blood flow to the heart. Prolonged reduced blood flow can cause a heart attack (myocardial infarction or MI). This condition may also be called coronary heart disease. Because CAD is the leading cause of death in men, it is important to understand what causes this condition and how it is treated. What are the causes? CAD is most often caused by atherosclerosis. This is the buildup of fat and cholesterol (plaque) on the inside of the arteries. Over time, the plaque may narrow or block the artery, reducing blood flow to the heart. Plaque can also become weak and break off within a coronary artery and cause a sudden blockage. Other less common causes of CAD include:  An embolism or blood clot in a coronary artery.  A tearing of the artery (spontaneous coronary artery dissection).  An aneurysm.  Inflammation (vasculitis) in the artery wall.  What increases the risk? The following factors may make you more likely to develop this condition:  Age. Men over age  53 are at a greater risk of CAD.  Family history of CAD.  Gender. Men often develop CAD earlier in life than women.  High blood pressure (hypertension).  Diabetes.  High cholesterol levels.  Tobacco use.  Excessive alcohol use.  Lack of exercise.  A diet high in saturated and trans fats, such as fried food and processed meat.  Other possible risk factors include:  High stress levels.  Depression.  Obesity.  Sleep apnea.  What are the signs or symptoms? Many people do not have any symptoms during the early stages of CAD. As the condition progresses, symptoms may include:  Chest pain (angina). The pain can: ? Feel like a crushing or squeezing, or a tightness, pressure, fullness, or heaviness in the chest. ? Last more than a few minutes or can stop and recur. The pain tends to get worse with exercise or stress and to fade with rest.  Pain in the arms, neck, jaw, or back.  Unexplained heartburn or indigestion.  Shortness  of breath.  Nausea or vomiting.  Sudden light-headedness.  Sudden cold sweats.  Fluttering or fast heartbeat (palpitations).  How is this diagnosed? This condition is diagnosed based on:  Your family and medical history.  A physical exam.  Tests, including: ? A test to check the electrical signals in your heart (electrocardiogram). ? Exercise stress test. This looks for signs of blockage when the heart is stressed with exercise, such as running on a treadmill. ? Pharmacologic stress test. This test looks for signs of blockage when the heart is being stressed with a medicine. ? Blood tests. ? Coronary angiogram. This is a procedure to look at the coronary arteries to see if there is any blockage. During this test, a dye is injected into your arteries so they appear on an X-ray. ? A test that uses sound waves to take a picture of your heart (echocardiogram). ? Chest X-ray.  How is this treated? This condition may be treated by:  Healthy  lifestyle changes to reduce risk factors.  Medicines such as: ? Antiplatelet medicines and blood-thinning medicines, such as aspirin. These help to prevent blood clots. ? Nitroglycerin. ? Blood pressure medicines. ? Cholesterol-lowering medicine.  Coronary angioplasty and stenting. During this procedure, a thin, flexible tube is inserted through a blood vessel and into a blocked artery. A balloon or similar device on the end of the tube is inflated to open up the artery. In some cases, a small, mesh tube (stent) is inserted into the artery to keep it open.  Coronary artery bypass surgery. During this surgery, veins or arteries from other parts of the body are used to create a bypass around the blockage and allow blood to reach your heart.  Follow these instructions at home: Medicines  Take over-the-counter and prescription medicines only as told by your health care provider.  Do not take the following medicines unless your health care provider approves: ? NSAIDs, such as ibuprofen, naproxen, or celecoxib. ? Vitamin supplements that contain vitamin A, vitamin E, or both. Lifestyle  Follow an exercise program approved by your health care provider. Aim for 150 minutes of moderate exercise or 75 minutes of vigorous exercise each week.  Maintain a healthy weight or lose weight as approved by your health care provider.  Rest when you are tired.  Learn to manage stress or try to limit your stress. Ask your health care provider for suggestions if you need help.  Get screened for depression and seek treatment, if needed.  Do not use any products that contain nicotine or tobacco, such as cigarettes and e-cigarettes. If you need help quitting, ask your health care provider.  Do not use illegal drugs. Eating and drinking  Follow a heart-healthy diet. A dietitian can help educate you about healthy food options and changes. In general, eat plenty of fruits and vegetables, lean meats, and whole  grains.  Avoid foods high in: ? Sugar. ? Salt (sodium). ? Saturated fat, such as processed or fatty meat. ? Trans fat, such as fried foods.  Use healthy cooking methods such as roasting, grilling, broiling, baking, poaching, steaming, or stir-frying.  If you drink alcohol, and your health care provider approves, limit your alcohol intake to no more than 2 drinks per day. One drink equals 12 ounces of beer, 5 ounces of wine, or 1 ounces of hard liquor. General instructions  Manage any other health conditions, such as hypertension and diabetes. These conditions affect your heart.  Your health care provider may ask you  to monitor your blood pressure. Ideally, your blood pressure should be below 130/80.  Keep all follow-up visits as told by your health care provider. This is important. Get help right away if:  You have pain in your chest, neck, arm, jaw, stomach, or back that: ? Lasts more than a few minutes. ? Is recurring. ? Is not relieved by taking medicine under your tongue (sublingualnitroglycerin).  You have too much (profuse) sweating without cause.  You have unexplained: ? Heartburn or indigestion. ? Shortness of breath or difficulty breathing. ? Fluttering or fast heartbeat (palpitations). ? Nausea or vomiting. ? Fatigue. ? Feelings of nervousness or anxiety. ? Weakness. ? Diarrhea.  You have sudden light-headedness or dizziness.  You faint.  You feel like hurting yourself or think about taking your own life. These symptoms may represent a serious problem that is an emergency. Do not wait to see if the symptoms will go away. Get medical help right away. Call your local emergency services (911 in the [place] Do not drive yourself to the hospital. Summary  Coronary artery disease (CAD) is a process in which the arteries that lead to the heart (coronary arteries) become narrow or blocked. The narrowing or blockage can lead to a heart attack.  Many people do not have  any symptoms during the early stages of CAD. This is called "silent CAD."  CAD can be treated with lifestyle changes, medicines, surgery, or a combination of these treatments. This information is not intended to replace advice given to you by your health care provider. Make sure you discuss any questions you have with your health care provider. Document Released: 06/07/2014 Document Revised: 10/31/2016 Document Reviewed: 10/31/2016 Elsevier Interactive Patient Education  [employer].

## 2018-11-15 ENCOUNTER — Encounter: Payer: Self-pay | Admitting: Internal Medicine

## 2018-11-15 DIAGNOSIS — E785 Hyperlipidemia, unspecified: Secondary | ICD-10-CM | POA: Insufficient documentation

## 2018-11-15 DIAGNOSIS — E1169 Type 2 diabetes mellitus with other specified complication: Secondary | ICD-10-CM | POA: Insufficient documentation

## 2018-11-15 DIAGNOSIS — I6522 Occlusion and stenosis of left carotid artery: Secondary | ICD-10-CM | POA: Insufficient documentation

## 2018-11-15 DIAGNOSIS — K760 Fatty (change of) liver, not elsewhere classified: Secondary | ICD-10-CM | POA: Insufficient documentation

## 2018-11-15 NOTE — Progress Notes (Signed)
Which Doctor Rogers Blocker?

## 2018-11-15 NOTE — Progress Notes (Signed)
Chief Complaint  Patient presents with  . Establish Care   Establish former PCP Dr. Loanne Drilling  1. HTN uncontrolled on hyzaar 100-12.5, metoprolol XL 25 mg qd  2. Chronic constipation on lactulose x 2 years 2x per day and not helping and wants to try something else  3. DM 2 x 30-40 years on 70/30 per Dr. Loanne Drilling 150 in am and 60 pm  4. CT 12/31/17 with left kidney stone, exophytic renal lesions ? Etiology favored complex cysts and enlarged prostate 5 .CAD/HLD, mild left CAS c/o intermittent chest pain has not f/u with Dr. Nehemiah Massed will rec he do this  6. ? H/o asthma vs COPD per pt h/o smoking cigars not cigarettes no inhalers used  7. 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 C/o fatigue    Review of Systems  Constitutional: Positive for malaise/fatigue. Negative for weight loss.  HENT: Negative for hearing loss.   Eyes: Negative for blurred vision.  Respiratory: Negative for shortness of breath.   Cardiovascular: Positive for chest pain.  Gastrointestinal: Positive for constipation.  Musculoskeletal: Negative for falls.  Skin: Negative for rash.  Neurological: Negative for headaches.  Psychiatric/Behavioral: Negative for depression.   Past Medical History:  Diagnosis Date  . Adenomatous colon polyp 06/2001  . ALLERGIC RHINITIS 08/03/2007  . BENIGN PROSTATIC HYPERTROPHY 08/03/2007  . 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 DIABETIC RETINOPATHY, BACKGROUND 09/29/2008  . DYSPHAGIA UNSPECIFIED 09/29/2008  . 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 GERD 11/28/2008  . HYPERLIPIDEMIA 08/03/2007  . 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 PAIN IN SOFT TISSUES OF LIMB 01/07/2008  . UNSPECIFIED ANEMIA 12/29/2007  . Unspecified essential hypertension 01/07/2008  . URINARY CALCULUS 09/29/2008   Past Surgical History:  Procedure Laterality Date  . CHOLECYSTECTOMY N/A  11/12/2016   Procedure: LAPAROSCOPIC CHOLECYSTECTOMY WITH INTRAOPERATIVE CHOLANGIOGRAM;  Surgeon: Clayburn Pert, MD;  Location: [facility] ORS;  Service: General;  Laterality: N/A;  . LITHOTRIPSY    . SHOULDER SURGERY     left, cyst and tumor removed   Family History  Problem Relation Age of Onset  . Diabetes Mother   . Depression Mother   . Prostate cancer Brother   . Diabetes Brother        x 3 brother  . Heart disease Brother    Social History   Socioeconomic History  . Marital status: Married    Spouse name: Not on file  . Number of children: 2  . Years of education: Not on file  . Highest education level: Not on file  Occupational History  . 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 Financial resource strain: Not on file  . Food insecurity:    Worry: Not on file    Inability: Not on file  . Transportation needs:    Medical: Not on file    Non-medical: Not on file  Tobacco Use  . Smoking status: Never Smoker  . Smokeless tobacco: Never Used  Substance and Sexual Activity  . Alcohol use: Yes    Comment: occasional  . Drug use: No  . Sexual activity: Not on file  Lifestyle  . Physical activity:    Days per week: Not on file    Minutes per session: Not on file  . Stress: Not on file  Relationships  . Social  connections:    Talks on phone: Not on file    Gets together: Not on file    Attends religious service: Not on file    Active member of club or organization: Not on file    Attends meetings of clubs or organizations: Not on file    Relationship status: Not on file  . Intimate partner violence:    Fear of current or ex partner: Not on file    Emotionally abused: Not on file    Physically abused: Not on file    Forced sexual activity: Not on file  Other Topics Concern  . Not on file  Social History Narrative   Daily Caffeine Use 1-2 daily   Current Meds  Medication Sig  . aspirin 81 MG tablet Take 81 mg by mouth daily.  .  Dutasteride-Tamsulosin HCl 0.5-0.4 MG CAPS Take 0.4 mg by mouth daily.  . insulin NPH-regular Human (NOVOLIN 70/30 RELION) (70-30) 100 UNIT/ML injection 150 units with breakfast, and 60 units with supper.  . Insulin Pen Needle 31G X 5 MM MISC Use as directed 2 X daily. BD ULTRAPFINE MINI. Dx: 250.00  . lactulose (CHRONULAC) 10 GM/15ML solution TAKE 30 ML  ( 2 TABLESPOONSFUL) BY MOUTHTWICE A DAY  . Lancets (ONETOUCH ULTRASOFT) lancets Use as instructed 2x daily Dx: 250.00  . losartan-hydrochlorothiazide (HYZAAR) 100-12.5 MG tablet TAKE 1 TABLET BY MOUTH EVERY DAY  . MAGNESIUM PO Take 1 tablet by mouth daily.   . metoprolol succinate (TOPROL-XL) 50 MG 24 hr tablet Take 1 tablet (50 mg total) by mouth daily.  . NON FORMULARY Prostate Peanut from [place].  Marland Kitchen omeprazole (PRILOSEC) 40 MG capsule Take 1 capsule (40 mg total) by mouth daily.  . ONE TOUCH ULTRA TEST test strip TEST TWICE DAILY  . simvastatin (ZOCOR) 40 MG tablet Take 1 tablet (40 mg total) by mouth at bedtime.  . traZODone (DESYREL) 50 MG tablet Take 1 tablet (50 mg total) by mouth at bedtime.  . vitamin E 400 UNIT capsule Take 400 Units by mouth daily.  . [DISCONTINUED] metoprolol succinate (TOPROL-XL) 25 MG 24 hr tablet Take 1 tablet (25 mg total) by mouth daily.   Allergies  Allergen Reactions  . Sulfamethoxazole-Trimethoprim Rash   Recent Results (from the past 2160 hour(s))  IBC panel     Status: None   Collection Time: 08/31/18 11:06 AM  Result Value Ref Range   Iron 98 42 - 165 ug/dL   Transferrin 305.0 212.0 - 360.0 mg/dL   Saturation Ratios 23.0 20.0 - 50.0 %  Lipid panel     Status: Abnormal   Collection Time: 08/31/18 11:06 AM  Result Value Ref Range   Cholesterol 103 0 - 200 mg/dL    Comment: ATP III Classification       Desirable:  < 200 mg/dL               Borderline High:  200 - 239 mg/dL          High:  > = 240 mg/dL   Triglycerides 369.0 (H) 0.0 - 149.0 mg/dL    Comment: Normal:  <150 mg/dLBorderline  High:  150 - 199 mg/dL   HDL 31.20 (L) >39.00 mg/dL   VLDL 73.8 (H) 0.0 - 40.0 mg/dL   Total CHOL/HDL Ratio 3     Comment:                Men          Women1/2 Average  Risk     3.4          3.3Average Risk          5.0          4.42X Average Risk          9.6          7.13X Average Risk          15.0          11.0                       NonHDL 71.73     Comment: NOTE:  Non-HDL goal should be 30 mg/dL higher than patient's LDL goal (i.e. LDL goal of < 70 mg/dL, would have non-HDL goal of < 100 mg/dL)  Microalbumin / creatinine urine ratio     Status: Abnormal   Collection Time: 08/31/18 11:06 AM  Result Value Ref Range   Microalb, Ur 2.6 (H) 0.0 - 1.9 mg/dL   Creatinine,U 145.0 mg/dL   Microalb Creat Ratio 1.8 0.0 - 30.0 mg/g  TSH     Status: None   Collection Time: 08/31/18 11:06 AM  Result Value Ref Range   TSH 1.04 0.35 - 4.50 uIU/mL  Basic metabolic panel     Status: Abnormal   Collection Time: 08/31/18 11:06 AM  Result Value Ref Range   Sodium 136 135 - 145 mEq/L   Potassium 4.4 3.5 - 5.1 mEq/L   Chloride 100 96 - 112 mEq/L   CO2 29 19 - 32 mEq/L   Glucose, Bld 176 (H) 70 - 99 mg/dL   BUN 21 6 - 23 mg/dL   Creatinine, Ser 1.53 (H) 0.40 - 1.50 mg/dL   Calcium 9.8 8.4 - 10.5 mg/dL   GFR 48.02 (L) >60.00 mL/min  Hepatic function panel     Status: None   Collection Time: 08/31/18 11:06 AM  Result Value Ref Range   Total Bilirubin 0.7 0.2 - 1.2 mg/dL   Bilirubin, Direct 0.1 0.0 - 0.3 mg/dL   Alkaline Phosphatase 53 39 - 117 U/L   AST 25 0 - 37 U/L   ALT 28 0 - 53 U/L   Total Protein 7.3 6.0 - 8.3 g/dL   Albumin 4.2 3.5 - 5.2 g/dL  CBC with Differential/Platelet     Status: Abnormal   Collection Time: 08/31/18 11:06 AM  Result Value Ref Range   WBC 5.8 4.0 - 10.5 K/uL   RBC 5.10 4.22 - 5.81 Mil/uL   Hemoglobin 16.5 13.0 - 17.0 g/dL   HCT 48.8 39.0 - 52.0 %   MCV 95.7 78.0 - 100.0 fl   MCHC 33.7 30.0 - 36.0 g/dL   RDW 14.3 11.5 - 15.5 %   Platelets 205.0 150.0 - 400.0  K/uL   Neutrophils Relative % 46.7 43.0 - 77.0 %   Lymphocytes Relative 35.0 12.0 - 46.0 %   Monocytes Relative 9.4 3.0 - 12.0 %   Eosinophils Relative 7.9 (H) 0.0 - 5.0 %   Basophils Relative 1.0 0.0 - 3.0 %   Neutro Abs 2.7 1.4 - 7.7 K/uL   Lymphs Abs 2.0 0.7 - 4.0 K/uL   Monocytes Absolute 0.5 0.1 - 1.0 K/uL   Eosinophils Absolute 0.5 0.0 - 0.7 K/uL   Basophils Absolute 0.1 0.0 - 0.1 K/uL  LDL cholesterol, direct     Status: None   Collection Time: 08/31/18 11:06 AM  Result Value Ref Range   Direct LDL 46.0  mg/dL    Comment: Optimal:  <100 mg/dLNear or Above Optimal:  100-129 mg/dLBorderline High:  130-159 mg/dLHigh:  160-189 mg/dLVery High:  >190 mg/dL   Objective  Body mass index is 31.68 kg/m. Wt Readings from Last 3 Encounters:  11/09/18 233 lb 9.6 oz (106 kg)  08/31/18 229 lb 3.2 oz (104 kg)  07/28/18 226 lb (102.5 kg)   Temp Readings from Last 3 Encounters:  11/09/18 98.3 F (36.8 C) (Oral)  05/24/18 98.2 F (36.8 C) (Oral)  12/03/16 98.7 F (37.1 C) (Oral)   BP Readings from Last 3 Encounters:  11/09/18 (!) 150/70  08/31/18 124/62  07/28/18 (!) 144/90   Pulse Readings from Last 3 Encounters:  11/09/18 81  08/31/18 78  07/28/18 75    Physical Exam Vitals signs and nursing note reviewed.  Constitutional:      Appearance: Normal appearance.  HENT:     Head: Normocephalic and atraumatic.     Nose: Nose normal.     Mouth/Throat:     Mouth: Mucous membranes are moist.     Pharynx: Oropharynx is clear.  Eyes:     Conjunctiva/sclera: Conjunctivae normal.     Pupils: Pupils are equal, round, and reactive to light.  Cardiovascular:     Rate and Rhythm: Normal rate and regular rhythm.     Heart sounds: Normal heart sounds.  Pulmonary:     Effort: Pulmonary effort is normal.     Breath sounds: Normal breath sounds.  Skin:    General: Skin is warm and dry.  Neurological:     General: No focal deficit present.     Mental Status: He is alert and oriented  to person, place, and time.     Gait: Gait normal.  Psychiatric:        Attention and Perception: Attention and perception normal.        Mood and Affect: Mood and affect normal.        Speech: Speech normal.        Behavior: Behavior normal. Behavior is cooperative.        Thought Content: Thought content normal.        Cognition and Memory: Cognition and memory normal.        Judgment: Judgment normal.     Assessment   1.HTN uncontrolled had meds today  2. Chronic constipation and fatty liver  3. 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 CT 12/31/17 with left kidney stones h/o kidney stones s/p lithotripsy, exophytic renal lesions ? Etiology favored complex cysts and enlarged prostate 5 .CAD/HLD, mild left CAS 6. ? H/o asthma vs COPD per pt h/o smoking cigars not cigarettes  7. HM 8. Vision changes 3 years ago never had f/u other than with eye MD and sx's resolved  9. Fatigue will w/u with labs Plan  1. On hyzaar 100-12.5, metoprolol xl 25 mg increase to 50 disc increase vs add CCB low dose could also maximize diuretic in combo pill  2.  Given lactulose not helping, disc otc miralax, colace, given sample linzess 145 mg qd x 1 box 4 pills pt has not tried yet as of 11/15/18  3.  On 70/30 150 units at breakfast and 60 with supper  Eye exam 2019 and foot exam 2019 per pt  F/u endocrine Dr. Loanne Drilling 12/01/2018  4. F/u with Dr. Rogers Blocker urology he is following PSA  5. rec pt f/u with Dr. Nehemiah Massed cards established will send referral likely needs stress test  6. Monitor  7.  Flu shot utd  Tdap utd 08/31/18  prevnar and pna 23 utd  Consider shingrix vaccine in future  sch fasting labs 12/02/18  Hep C negative 07/09/16, check hep A/B with fatty liver    Never smoker except for cigars  GI Dr. Fuller Plan 11/02/13 small IH f/u in 5 years referral sent  Dermatology established with [facility] last seen 12/02/17  8.  Consider CT head vs MRI in future pt wants to hold for now  9. Labs as ordered   Dentist  Dr. Thalia Bloodgood  Provider: Dr. Olivia Mackie McLean-Scocuzza-Internal Medicine

## 2018-11-15 NOTE — Progress Notes (Signed)
Note has been forwarded

## 2018-11-22 ENCOUNTER — Telehealth: Payer: Self-pay | Admitting: Endocrinology

## 2018-11-22 NOTE — Telephone Encounter (Signed)
Please refer request to PCP 

## 2018-11-22 NOTE — Telephone Encounter (Signed)
Patient has called around to many different drug stores non of which are able to get the HCTZ is there a different rx that can be called in for him?

## 2018-11-22 NOTE — Telephone Encounter (Signed)
Please advise 

## 2018-11-22 NOTE — Telephone Encounter (Signed)
Called pt and made him aware of Dr. Cordelia Pen recommendation. Verbalized acceptance and understanding.

## 2018-11-23 NOTE — Telephone Encounter (Signed)
RX name losartan-hydrochlorothiazide (HYZAAR) 100-12.5 MG tablet

## 2018-11-25 ENCOUNTER — Other Ambulatory Visit: Payer: Self-pay | Admitting: Internal Medicine

## 2018-11-25 DIAGNOSIS — I10 Essential (primary) hypertension: Secondary | ICD-10-CM

## 2018-11-25 MED ORDER — LOSARTAN POTASSIUM-HCTZ 100-12.5 MG PO TABS: 1.0000 | ORAL_TABLET | Freq: Every day | ORAL | 3 refills | Status: DC

## 2018-11-25 NOTE — Telephone Encounter (Signed)
Does his [facility] have Hyzaar 100-12.5 call pharmacy to verify?   [place]

## 2018-11-26 ENCOUNTER — Other Ambulatory Visit: Payer: Self-pay | Admitting: Internal Medicine

## 2018-11-26 DIAGNOSIS — I10 Essential (primary) hypertension: Secondary | ICD-10-CM

## 2018-11-26 MED ORDER — LOSARTAN POTASSIUM 100 MG PO TABS: 100.0000 mg | ORAL_TABLET | Freq: Every day | ORAL | 3 refills | Status: DC

## 2018-11-26 MED ORDER — HYDROCHLOROTHIAZIDE 12.5 MG PO TABS: 12.5000 mg | ORAL_TABLET | Freq: Every day | ORAL | 3 refills | Status: DC

## 2018-11-26 NOTE — Telephone Encounter (Signed)
Pharmacy does not have that combination.  The pharmacist recommends two separate pills.

## 2018-11-28 ENCOUNTER — Encounter: Payer: Self-pay | Admitting: Gastroenterology

## 2018-12-01 ENCOUNTER — Ambulatory Visit: Payer: PPO | Admitting: Endocrinology

## 2018-12-01 ENCOUNTER — Encounter: Payer: Self-pay | Admitting: Endocrinology

## 2018-12-01 VITALS — BP 144/74 | HR 88 | Ht 72.0 in | Wt 231.4 lb

## 2018-12-01 DIAGNOSIS — E11319 Type 2 diabetes mellitus with unspecified diabetic retinopathy without macular edema: Secondary | ICD-10-CM

## 2018-12-01 DIAGNOSIS — Z794 Long term (current) use of insulin: Secondary | ICD-10-CM

## 2018-12-01 LAB — POCT GLYCOSYLATED HEMOGLOBIN (HGB A1C): HEMOGLOBIN A1C: 6.9 % — AB (ref 4.0–5.6)

## 2018-12-01 MED ORDER — INSULIN NPH ISOPHANE & REGULAR (70-30) 100 UNIT/ML ~~LOC~~ SUSP: SUBCUTANEOUS | 13 refills | Status: DC

## 2018-12-01 NOTE — Patient Instructions (Addendum)
Your blood pressure is high today.  Please see your primary care provider soon, to have it rechecked Please reduce the insulin to150 units with breakfast, and 50 units with supper However, if you are going to be active, take just 120 units in the morning.     check your blood sugar twice a day.  vary the time of day when you check, between before the 3 meals, and at bedtime.  also check if you have symptoms of your blood sugar being too high or too low.  please keep a record of the readings and bring it to your next appointment here (or you can bring the meter itself).  You can write it on any piece of paper.  please call us sooner if your blood sugar goes below 70, or if you have a lot of readings over 200.   Please come back for a follow-up appointment in 4 months

## 2018-12-01 NOTE — Progress Notes (Signed)
Subjective:    Patient ID: [name], male    DOB: June 18, 1946, 73 y.o.   MRN: [id number]  HPI Pt returns for f/u of diabetes mellitus: DM type: Insulin-requiring type 2 Dx'ed: 0932 Complications: renal insuff and retinopathy.  Therapy: insulin since soon after dx.   DKA: never.  Severe hypoglycemia: never.   Pancreatitis: never.   Other: he is on a BID insulin schedule, due to noncompliance; he takes human insulin, due to cost.  He is retired.   Interval history: no cbg record, but states cbg's vary from 65-200's.  It is in general higher as the day goes on.  He seldom has hypoglycemia, and these episodes are mild.  He sometimes takes more than the rx'ed PM insulin, due to hyperglycemia then.   Past Medical History:  Diagnosis Date  . Adenomatous colon polyp 06/2001  . ALLERGIC RHINITIS 08/03/2007  . Allergy   . Asthma   . BENIGN PROSTATIC HYPERTROPHY 08/03/2007  . Chronic back pain   . COPD (chronic obstructive pulmonary disease) ([place])   . 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 Diabetes mellitus without complication ([place])    x 35-57 years as of 11/08/18   . DIABETIC RETINOPATHY, BACKGROUND 09/29/2008  . DYSPHAGIA UNSPECIFIED 09/29/2008  . 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 GERD 11/28/2008  . HYPERLIPIDEMIA 08/03/2007  . Kidney stones    Dr. Boneta Lucks  . 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 PAIN IN SOFT TISSUES OF LIMB 01/07/2008  . UNSPECIFIED ANEMIA 12/29/2007  . Unspecified essential hypertension 01/07/2008  . URINARY CALCULUS 09/29/2008  . Urine incontinence     Past Surgical History:  Procedure Laterality Date  . CHOLECYSTECTOMY N/A 11/12/2016   Procedure: LAPAROSCOPIC CHOLECYSTECTOMY WITH INTRAOPERATIVE CHOLANGIOGRAM;  Surgeon: Clayburn Pert, MD;  Location: [facility] ORS;  Service: General;  Laterality: N/A;  . HAND SURGERY     2016  . LITHOTRIPSY    . SHOULDER SURGERY     left, cyst and tumor removed    Social History   Socioeconomic History  .  Marital status: Married    Spouse name: Not on file  . Number of children: 2  . Years of education: Not on file  . Highest education level: Not on file  Occupational History  . 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 Financial resource strain: Not on file  . Food insecurity:    Worry: Not on file    Inability: Not on file  . Transportation needs:    Medical: Not on file    Non-medical: Not on file  Tobacco Use  . Smoking status: Never Smoker  . Smokeless tobacco: Never Used  Substance and Sexual Activity  . Alcohol use: Yes    Comment: occasional  . Drug use: No  . Sexual activity: Not on file  Lifestyle  . Physical activity:    Days per week: Not on file    Minutes per session: Not on file  . Stress: Not on file  Relationships  . Social connections:    Talks on phone: Not on file    Gets together: Not on file    Attends religious service: Not on file    Active member of club or organization: Not on file    Attends meetings of clubs or organizations: Not on file    Relationship status: Not on file  . Intimate partner violence:    Fear of current or ex partner: Not  on file    Emotionally abused: Not on file    Physically abused: Not on file    Forced sexual activity: Not on file  Other Topics Concern  . 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 aspirin 81 MG tablet Take 81 mg by mouth daily.    . Dutasteride-Tamsulosin HCl 0.5-0.4 MG CAPS Take 0.4 mg by mouth daily.    . hydrochlorothiazide (HYDRODIURIL) 12.5 MG tablet Take 1 tablet (12.5 mg total) by mouth daily. In am inform pt combination not available 90 tablet 3  . Insulin Pen Needle 31G X 5 MM MISC Use as directed 2 X daily. BD ULTRAPFINE MINI. Dx: 250.00 100 each 5  .  lactulose (CHRONULAC) 10 GM/15ML solution TAKE 30 ML  ( 2 TABLESPOONSFUL) BY MOUTHTWICE A DAY 1892 mL 11  . Lancets (ONETOUCH ULTRASOFT) lancets Use as instructed 2x daily Dx: 250.00 100 each 5  . losartan (COZAAR) 100 MG tablet Take 1 tablet (100 mg total) by mouth daily. In am inform pt combination not available 90 tablet 3  . MAGNESIUM PO Take 1 tablet by mouth daily.     . metoprolol succinate (TOPROL-XL) 50 MG 24 hr tablet Take 1 tablet (50 mg total) by mouth daily. 90 tablet 1  . NON FORMULARY Prostate Peanut from [place].    Marland Kitchen omeprazole (PRILOSEC) 40 MG capsule Take 1 capsule (40 mg total) by mouth daily. 90 capsule 1  . ONE TOUCH ULTRA TEST test strip TEST TWICE DAILY 100 each 11  . simvastatin (ZOCOR) 40 MG tablet Take 1 tablet (40 mg total) by mouth at bedtime. 90 tablet 3  . vitamin E 400 UNIT capsule Take 400 Units by mouth daily.    Marland Kitchen losartan-hydrochlorothiazide (HYZAAR) 100-12.5 MG tablet Take 1 tablet by mouth daily. (Patient not taking: Reported on 12/01/2018) 90 tablet 3  . traZODone (DESYREL) 50 MG tablet Take 1 tablet (50 mg total) by mouth at bedtime. (Patient not taking: Reported on 12/01/2018) 90 tablet 2   No current facility-administered medications on file prior to visit.     Allergies  Allergen Reactions  . Sulfamethoxazole-Trimethoprim Rash    Family History  Problem Relation Age of Onset  . Diabetes Mother   . Depression Mother   . Prostate cancer Brother   . Diabetes Brother        x 3 brother  . Heart disease Brother     BP (!) 144/74 (BP Location: Left Arm, Patient Position: Sitting, Cuff Size: Normal)   Pulse 88   Ht 6' (1.829 m)   Wt 231 lb 6.4 oz (105 kg)   SpO2 97%   BMI 31.38 kg/m    Review of Systems Denies LOC    Objective:   Physical Exam VITAL SIGNS:  See vs page GENERAL: no distress Pulses: dorsalis pedis intact bilat.   MSK: no deformity of the feet CV: trace bilat leg edema Skin:  no ulcer on the feet.  normal color  and temp on the feet. Neuro: sensation is intact to touch on the feet Ext: There is bilateral onychomycosis of the toenails.     Lab Results  Component Value Date   HGBA1C 6.9 (A) 12/01/2018  Lab Results  Component Value Date   CREATININE 1.30 12/02/2018   BUN 17 12/02/2018   NA 140 12/02/2018   K 4.3 12/02/2018   CL 106 12/02/2018   CO2 26 12/02/2018       Assessment & Plan:  HTN: is noted today Insulin-requiring type 2 DM: overcontrolled, given this regimen, which does match insulin to his changing needs throughout the day Hypoglycemia: this limits aggressiveness of glycemic control.   Renal insuff: in this context, he needs most of his insulin in the morning.    Patient Instructions  Your blood pressure is high today.  Please see your primary care provider soon, to have it rechecked Please reduce the insulin to150 units with breakfast, and 50 units with supper However, if you are going to be active, take just 120 units in the morning.     check your blood sugar twice a day.  vary the time of day when you check, between before the 3 meals, and at bedtime.  also check if you have symptoms of your blood sugar being too high or too low.  please keep a record of the readings and bring it to your next appointment here (or you can bring the meter itself).  You can write it on any piece of paper.  please call us sooner if your blood sugar goes below 70, or if you have a lot of readings over 200.   Please come back for a follow-up appointment in 4 months

## 2018-12-02 ENCOUNTER — Other Ambulatory Visit (INDEPENDENT_AMBULATORY_CARE_PROVIDER_SITE_OTHER): Payer: PPO

## 2018-12-02 DIAGNOSIS — E291 Testicular hypofunction: Secondary | ICD-10-CM | POA: Diagnosis not present

## 2018-12-02 DIAGNOSIS — Z794 Long term (current) use of insulin: Secondary | ICD-10-CM | POA: Diagnosis not present

## 2018-12-02 DIAGNOSIS — E538 Deficiency of other specified B group vitamins: Secondary | ICD-10-CM | POA: Diagnosis not present

## 2018-12-02 DIAGNOSIS — Z87442 Personal history of urinary calculi: Secondary | ICD-10-CM | POA: Diagnosis not present

## 2018-12-02 DIAGNOSIS — K760 Fatty (change of) liver, not elsewhere classified: Secondary | ICD-10-CM

## 2018-12-02 DIAGNOSIS — I10 Essential (primary) hypertension: Secondary | ICD-10-CM

## 2018-12-02 DIAGNOSIS — I2510 Atherosclerotic heart disease of native coronary artery without angina pectoris: Secondary | ICD-10-CM | POA: Diagnosis not present

## 2018-12-02 DIAGNOSIS — E559 Vitamin D deficiency, unspecified: Secondary | ICD-10-CM | POA: Diagnosis not present

## 2018-12-02 DIAGNOSIS — R5383 Other fatigue: Secondary | ICD-10-CM | POA: Diagnosis not present

## 2018-12-02 DIAGNOSIS — E11319 Type 2 diabetes mellitus with unspecified diabetic retinopathy without macular edema: Secondary | ICD-10-CM | POA: Diagnosis not present

## 2018-12-02 DIAGNOSIS — Z1329 Encounter for screening for other suspected endocrine disorder: Secondary | ICD-10-CM

## 2018-12-02 LAB — COMPREHENSIVE METABOLIC PANEL
ALT: 25 U/L (ref 0–53)
AST: 21 U/L (ref 0–37)
Albumin: 4 g/dL (ref 3.5–5.2)
Alkaline Phosphatase: 52 U/L (ref 39–117)
BUN: 17 mg/dL (ref 6–23)
CO2: 26 mEq/L (ref 19–32)
Calcium: 9.3 mg/dL (ref 8.4–10.5)
Chloride: 106 mEq/L (ref 96–112)
Creatinine, Ser: 1.3 mg/dL (ref 0.40–1.50)
GFR: 57.91 mL/min — ABNORMAL LOW (ref >60.00)
Glucose, Bld: 137 mg/dL — ABNORMAL HIGH (ref 70–99)
POTASSIUM: 4.3 meq/L (ref 3.5–5.1)
Sodium: 140 mEq/L (ref 135–145)
Total Bilirubin: 0.6 mg/dL (ref 0.2–1.2)
Total Protein: 6.8 g/dL (ref 6.0–8.3)

## 2018-12-02 LAB — CBC WITH DIFFERENTIAL/PLATELET
Basophils Absolute: 0.1 10*3/uL (ref 0.0–0.1)
Basophils Relative: 1.1 % (ref 0.0–3.0)
Eosinophils Absolute: 0.4 10*3/uL (ref 0.0–0.7)
Eosinophils Relative: 6 % — ABNORMAL HIGH (ref 0.0–5.0)
HCT: 48.8 % (ref 39.0–52.0)
Hemoglobin: 16.5 g/dL (ref 13.0–17.0)
LYMPHS PCT: 37.5 % (ref 12.0–46.0)
Lymphs Abs: 2.3 10*3/uL (ref 0.7–4.0)
MCHC: 33.9 g/dL (ref 30.0–36.0)
MCV: 94.6 fl (ref 78.0–100.0)
MONOS PCT: 12.6 % — AB (ref 3.0–12.0)
Monocytes Absolute: 0.8 10*3/uL (ref 0.1–1.0)
Neutro Abs: 2.6 10*3/uL (ref 1.4–7.7)
Neutrophils Relative %: 42.8 % — ABNORMAL LOW (ref 43.0–77.0)
Platelets: 197 10*3/uL (ref 150.0–400.0)
RBC: 5.16 Mil/uL (ref 4.22–5.81)
RDW: 13.5 % (ref 11.5–15.5)
WBC: 6.1 10*3/uL (ref 4.0–10.5)

## 2018-12-02 LAB — LIPID PANEL
Cholesterol: 86 mg/dL (ref 0–200)
HDL: 29.6 mg/dL — ABNORMAL LOW (ref >39.00)
LDL Cholesterol: 17 mg/dL (ref 0–99)
NonHDL: 56.83
Total CHOL/HDL Ratio: 3
Triglycerides: 197 mg/dL — ABNORMAL HIGH (ref 0.0–149.0)
VLDL: 39.4 mg/dL (ref 0.0–40.0)

## 2018-12-02 LAB — HEMOGLOBIN A1C: Hgb A1c MFr Bld: 7.6 % — ABNORMAL HIGH (ref 4.6–6.5)

## 2018-12-02 LAB — VITAMIN B12: Vitamin B-12: 173 pg/mL — ABNORMAL LOW (ref 211–911)

## 2018-12-02 LAB — TSH: TSH: 0.96 u[IU]/mL (ref 0.35–4.50)

## 2018-12-02 LAB — TESTOSTERONE: Testosterone: 336.16 ng/dL (ref 300.00–890.00)

## 2018-12-02 LAB — VITAMIN D 25 HYDROXY (VIT D DEFICIENCY, FRACTURES): VITD: 22.94 ng/mL — ABNORMAL LOW (ref 30.00–100.00)

## 2018-12-02 NOTE — Addendum Note (Signed)
Addended by: Arby Barrette on: 12/02/2018 08:03 AM   Modules accepted: Orders

## 2018-12-03 LAB — URINALYSIS, ROUTINE W REFLEX MICROSCOPIC
Bilirubin, UA: NEGATIVE
Glucose, UA: NEGATIVE
Ketones, UA: NEGATIVE
Leukocytes, UA: NEGATIVE
Nitrite, UA: NEGATIVE
Protein, UA: NEGATIVE
RBC, UA: NEGATIVE
Specific Gravity, UA: 1.018 (ref 1.005–1.030)
Urobilinogen, Ur: 1 mg/dL (ref 0.2–1.0)
pH, UA: 5.5 (ref 5.0–7.5)

## 2018-12-03 LAB — HEPATITIS B SURFACE ANTIBODY, QUANTITATIVE: Hep B S AB Quant (Post): <5 m[IU]/mL — ABNORMAL LOW (ref >=10)

## 2018-12-03 LAB — HEPATITIS A ANTIBODY, TOTAL: Hepatitis A AB,Total: REACTIVE — AB

## 2018-12-03 LAB — HEPATITIS B SURFACE ANTIGEN: Hepatitis B Surface Ag: NONREACTIVE

## 2018-12-07 ENCOUNTER — Telehealth: Payer: Self-pay | Admitting: Endocrinology

## 2018-12-07 NOTE — Telephone Encounter (Signed)
Pt called for lab results, went over results with pt

## 2018-12-07 NOTE — Telephone Encounter (Signed)
Patient states that he is returning a call to the office. Please Advise, thanks

## 2018-12-16 ENCOUNTER — Ambulatory Visit (INDEPENDENT_AMBULATORY_CARE_PROVIDER_SITE_OTHER): Payer: PPO | Admitting: Internal Medicine

## 2018-12-16 ENCOUNTER — Encounter: Payer: Self-pay | Admitting: Internal Medicine

## 2018-12-16 VITALS — BP 134/68 | HR 76 | Temp 98.0°F | Ht 72.0 in | Wt 231.0 lb

## 2018-12-16 DIAGNOSIS — Z794 Long term (current) use of insulin: Secondary | ICD-10-CM | POA: Diagnosis not present

## 2018-12-16 DIAGNOSIS — E559 Vitamin D deficiency, unspecified: Secondary | ICD-10-CM

## 2018-12-16 DIAGNOSIS — K5909 Other constipation: Secondary | ICD-10-CM

## 2018-12-16 DIAGNOSIS — E538 Deficiency of other specified B group vitamins: Secondary | ICD-10-CM | POA: Insufficient documentation

## 2018-12-16 DIAGNOSIS — I10 Essential (primary) hypertension: Secondary | ICD-10-CM

## 2018-12-16 DIAGNOSIS — E11319 Type 2 diabetes mellitus with unspecified diabetic retinopathy without macular edema: Secondary | ICD-10-CM

## 2018-12-16 MED ORDER — CYANOCOBALAMIN 1000 MCG/ML IJ SOLN
1000.0000 ug | Freq: Once | INTRAMUSCULAR | Status: AC
  Administered 2018-12-16: 1000 ug via INTRAMUSCULAR

## 2018-12-16 NOTE — Patient Instructions (Addendum)
Cinnamon capsules can help with blood sugar control 500 mg 1-2 x per day   Whole wheat/grain bread better  Insulin 150 in am, 50 mg at night  May consider glucose tablets   Vitamin D3 5000 IU daily over the counter    Diabetes Mellitus and Nutrition, Adult When you have diabetes (diabetes mellitus), it is very important to have healthy eating habits because your blood sugar (glucose) levels are greatly affected by what you eat and drink. Eating healthy foods in the appropriate amounts, at about the same times every day, can help you:  Control your blood glucose.  Lower your risk of heart disease.  Improve your blood pressure.  Reach or maintain a healthy weight. Every person with diabetes is different, and each person has different needs for a meal plan. Your health care provider may recommend that you work with a diet and nutrition specialist (dietitian) to make a meal plan that is best for you. Your meal plan may vary depending on factors such as:  The calories you need.  The medicines you take.  Your weight.  Your blood glucose, blood pressure, and cholesterol levels.  Your activity level.  Other health conditions you have, such as heart or kidney disease. How do carbohydrates affect me? Carbohydrates, also called carbs, affect your blood glucose level more than any other type of food. Eating carbs naturally raises the amount of glucose in your blood. Carb counting is a method for keeping track of how many carbs you eat. Counting carbs is important to keep your blood glucose at a healthy level, especially if you use insulin or take certain oral diabetes medicines. It is important to know how many carbs you can safely have in each meal. This is different for every person. Your dietitian can help you calculate how many carbs you should have at each meal and for each snack. Foods that contain carbs include:  Bread, cereal, rice, pasta, and crackers.  Potatoes and corn.  Peas,  beans, and lentils.  Milk and yogurt.  Fruit and juice.  Desserts, such as cakes, cookies, ice cream, and candy. How does alcohol affect me? Alcohol can cause a sudden decrease in blood glucose (hypoglycemia), especially if you use insulin or take certain oral diabetes medicines. Hypoglycemia can be a life-threatening condition. Symptoms of hypoglycemia (sleepiness, dizziness, and confusion) are similar to symptoms of having too much alcohol. If your health care provider says that alcohol is safe for you, follow these guidelines:  Limit alcohol intake to no more than 1 drink per day for nonpregnant women and 2 drinks per day for men. One drink equals 12 oz of beer, 5 oz of wine, or 1 oz of hard liquor.  Do not drink on an empty stomach.  Keep yourself hydrated with water, diet soda, or unsweetened iced tea.  Keep in mind that regular soda, juice, and other mixers may contain a lot of sugar and must be counted as carbs. What are tips for following this plan?  Reading food labels  Start by checking the serving size on the "Nutrition Facts" label of packaged foods and drinks. The amount of calories, carbs, fats, and other nutrients listed on the label is based on one serving of the item. Many items contain more than one serving per package.  Check the total grams (g) of carbs in one serving. You can calculate the number of servings of carbs in one serving by dividing the total carbs by 15. For example, if a  food has 30 g of total carbs, it would be equal to 2 servings of carbs.  Check the number of grams (g) of saturated and trans fats in one serving. Choose foods that have low or no amount of these fats.  Check the number of milligrams (mg) of salt (sodium) in one serving. Most people should limit total sodium intake to less than 2,300 mg per day.  Always check the nutrition information of foods labeled as "low-fat" or "nonfat". These foods may be higher in added sugar or refined carbs  and should be avoided.  Talk to your dietitian to identify your daily goals for nutrients listed on the label. Shopping  Avoid buying canned, premade, or processed foods. These foods tend to be high in fat, sodium, and added sugar.  Shop around the outside edge of the grocery store. This includes fresh fruits and vegetables, bulk grains, fresh meats, and fresh dairy. Cooking  Use low-heat cooking methods, such as baking, instead of high-heat cooking methods like deep frying.  Cook using healthy oils, such as olive, canola, or sunflower oil.  Avoid cooking with butter, cream, or high-fat meats. Meal planning  Eat meals and snacks regularly, preferably at the same times every day. Avoid going long periods of time without eating.  Eat foods high in fiber, such as fresh fruits, vegetables, beans, and whole grains. Talk to your dietitian about how many servings of carbs you can eat at each meal.  Eat 4-6 ounces (oz) of lean protein each day, such as lean meat, chicken, fish, eggs, or tofu. One oz of lean protein is equal to: ? 1 oz of meat, chicken, or fish. ? 1 egg. ?  cup of tofu.  Eat some foods each day that contain healthy fats, such as avocado, nuts, seeds, and fish. Lifestyle  Check your blood glucose regularly.  Exercise regularly as told by your health care provider. This may include: ? 150 minutes of moderate-intensity or vigorous-intensity exercise each week. This could be brisk walking, biking, or water aerobics. ? Stretching and doing strength exercises, such as yoga or weightlifting, at least 2 times a week.  Take medicines as told by your health care provider.  Do not use any products that contain nicotine or tobacco, such as cigarettes and e-cigarettes. If you need help quitting, ask your health care provider.  Work with a Social worker or diabetes educator to identify strategies to manage stress and any emotional and social challenges. Questions to ask a health care  provider  Do I need to meet with a diabetes educator?  Do I need to meet with a dietitian?  What number can I call if I have questions?  When are the best times to check my blood glucose? Where to find more information:  American Diabetes Association: diabetes.org  Academy of Nutrition and Dietetics: www.eatright.[employer] of Diabetes and Digestive and Kidney Diseases (NIH): [employer] Summary  A healthy meal plan will help you control your blood glucose and maintain a healthy lifestyle.  Working with a diet and nutrition specialist (dietitian) can help you make a meal plan that is best for you.  Keep in mind that carbohydrates (carbs) and alcohol have immediate effects on your blood glucose levels. It is important to count carbs and to use alcohol carefully. This information is not intended to replace advice given to you by your health care provider. Make sure you discuss any questions you have with your health care provider. Document Released: 08/07/2005 Document Revised:  06/10/2017 Document Reviewed: 12/15/2016 Elsevier Interactive Patient Education  2019 [employer].  Hypoglycemia/low blood sugar  Hypoglycemia occurs when the level of sugar (glucose) in the blood is too low. Hypoglycemia can happen in people who do or do not have diabetes. It can develop quickly, and it can be a medical emergency. For most people with diabetes, a blood glucose level below 70 mg/dL (3.9 mmol/L) is considered hypoglycemia. Glucose is a type of sugar that provides the body's main source of energy. Certain hormones (insulin and glucagon) control the level of glucose in the blood. Insulin lowers blood glucose, and glucagon raises blood glucose. Hypoglycemia can result from having too much insulin in the bloodstream, or from not eating enough food that contains glucose. You may also have reactive hypoglycemia, which happens within 4 hours after eating a meal. What are the  causes? Hypoglycemia occurs most often in people who have diabetes and may be caused by:  Diabetes medicine.  Not eating enough, or not eating often enough.  Increased physical activity.  Drinking alcohol on an empty stomach. If you do not have diabetes, hypoglycemia may be caused by:  A tumor in the pancreas.  Not eating enough, or not eating for long periods at a time (fasting).  A severe infection or illness.  Certain medicines. What increases the risk? Hypoglycemia is more likely to develop in:  People who have diabetes and take medicines to lower blood glucose.  People who abuse alcohol.  People who have a severe illness. What are the signs or symptoms? Mild symptoms Mild hypoglycemia may not cause any symptoms. If you do have symptoms, they may include:  Hunger.  Anxiety.  Sweating and feeling clammy.  Dizziness or feeling light-headed.  Sleepiness.  Nausea.  Increased heart rate.  Headache.  Blurry vision.  Irritability.  Tingling or numbness around the mouth, lips, or tongue.  A change in coordination.  Restless sleep. Moderate symptoms Moderate hypoglycemia can cause:  Mental confusion and poor judgment.  Behavior changes.  Weakness.  Irregular heartbeat. Severe symptoms Severe hypoglycemia is a medical emergency. It can cause:  Fainting.  Seizures.  Loss of consciousness (coma).  Death. How is this diagnosed? Hypoglycemia is diagnosed with a blood test to measure your blood glucose level. This blood test is done while you are having symptoms. Your health care provider may also do a physical exam and review your medical history. How is this treated? This condition can often be treated by immediately eating or drinking something that contains sugar, such as:  Fruit juice, 4-6 oz (120-150 mL).  Regular soda (not diet soda), 4-6 oz (120-150 mL).  Low-fat milk, 4 oz (120 mL).  Several pieces of hard candy.  Sugar or honey,  1 Tbsp (15 mL). Treating hypoglycemia if you have diabetes If you are alert and able to swallow safely, follow the 15:15 rule:  Take 15 grams of a rapid-acting carbohydrate. Talk with your health care provider about how much you should take.  Rapid-acting options include: ? Glucose pills (take 15 grams). ? 6-8 pieces of hard candy. ? 4-6 oz (120-150 mL) of fruit juice. ? 4-6 oz (120-150 mL) of regular (not diet) soda. ? 1 Tbsp (15 mL) honey or sugar.  Check your blood glucose 15 minutes after you take the carbohydrate.  If the repeat blood glucose level is still at or below 70 mg/dL (3.9 mmol/L), take 15 grams of a carbohydrate again.  If your blood glucose level does not increase above 70  mg/dL (3.9 mmol/L) after 3 tries, seek emergency medical care.  After your blood glucose level returns to normal, eat a meal or a snack within 1 hour.  Treating severe hypoglycemia Severe hypoglycemia is when your blood glucose level is at or below 54 mg/dL (3 mmol/L). Severe hypoglycemia is a medical emergency. Get medical help right away. If you have severe hypoglycemia and you cannot eat or drink, you may need an injection of glucagon. A family member or close friend should learn how to check your blood glucose and how to give you a glucagon injection. Ask your health care provider if you need to have an emergency glucagon injection kit available. Severe hypoglycemia may need to be treated in a hospital. The treatment may include getting glucose through an IV. You may also need treatment for the cause of your hypoglycemia. Follow these instructions at home:  General instructions  Take over-the-counter and prescription medicines only as told by your health care provider.  Monitor your blood glucose as told by your health care provider.  Limit alcohol intake to no more than 1 drink a day for nonpregnant women and 2 drinks a day for men. One drink equals 12 oz of beer (355 mL), 5 oz of wine (148  mL), or 1 oz of hard liquor (44 mL).  Keep all follow-up visits as told by your health care provider. This is important. If you have diabetes:  Always have a rapid-acting carbohydrate snack with you to treat low blood glucose.  Follow your diabetes management plan as directed. Make sure you: ? Know the symptoms of hypoglycemia. It is important to treat it right away to prevent it from becoming severe. ? Take your medicines as directed. ? Follow your exercise plan. ? Follow your meal plan. Eat on time, and do not skip meals. ? Check your blood glucose as often as directed. Always check before and after exercise. ? Follow your sick day plan whenever you cannot eat or drink normally. Make this plan in advance with your health care provider.  Share your diabetes management plan with people in your workplace, school, and household.  Check your urine for ketones when you are ill and as told by your health care provider.  Carry a medical alert card or wear medical alert jewelry. Contact a health care provider if:  You have problems keeping your blood glucose in your target range.  You have frequent episodes of hypoglycemia. Get help right away if:  You continue to have hypoglycemia symptoms after eating or drinking something containing glucose.  Your blood glucose is at or below 54 mg/dL (3 mmol/L).  You have a seizure.  You faint. These symptoms may represent a serious problem that is an emergency. Do not wait to see if the symptoms will go away. Get medical help right away. Call your local emergency services (911 in the [place] Summary  Hypoglycemia occurs when the level of sugar (glucose) in the blood is too low.  Hypoglycemia can happen in people who do or do not have diabetes. It can develop quickly, and it can be a medical emergency.  Make sure you know the symptoms of hypoglycemia and how to treat it.  Always have a rapid-acting carbohydrate snack with you to treat low  blood sugar. This information is not intended to replace advice given to you by your health care provider. Make sure you discuss any questions you have with your health care provider. Document Released: 11/10/2005 Document Revised: 05/04/2018 Document Reviewed:  12/14/2015 Elsevier Interactive Patient Education  2019 [employer] Inc[employer]   Nonalcoholic Fatty Liver Disease Diet Nonalcoholic fatty liver disease is a condition that causes fat to accumulate in and around the liver. The disease makes it harder for the liver to work the way that it should. Following a healthy diet can help to keep nonalcoholic fatty liver disease under control. It can also help to prevent or improve conditions that are associated with the disease, such as heart disease, diabetes, high blood pressure, and abnormal cholesterol levels. Along with regular exercise, this diet:  Promotes weight loss.  Helps to control blood sugar levels.  Helps to improve the way that the body uses insulin. What do I need to know about this diet?  Use the glycemic index (GI) to plan your meals. The index tells you how quickly a food will raise your blood sugar. Choose low-GI foods. These foods take a longer time to raise blood sugar.  Keep track of how many calories you take in. Eating the right amount of calories will help you to achieve a healthy weight.  You may want to follow a Mediterranean diet. This diet includes a lot of vegetables, lean meats or fish, whole grains, fruits, and healthy oils and fats. What foods can I eat? Grains Whole grains, such as whole-wheat or whole-grain breads, crackers, tortillas, cereals, and pasta. Stone-ground whole wheat. Pumpernickel bread. Unsweetened oatmeal. Bulgur. Barley. Quinoa. Brown or wild rice. Corn or whole-wheat flour tortillas. Vegetables Lettuce. Spinach. Peas. Beets. Cauliflower. Cabbage. Broccoli. Carrots. Tomatoes. Squash. Eggplant. Herbs. Peppers. Onions. Cucumbers. Brussels sprouts. Yams  and sweet potatoes. Beans. Lentils. Fruits Bananas. Apples. Oranges. Grapes. Papaya. Mango. Pomegranate. Kiwi. Grapefruit. Cherries. Meats and Other Protein Sources Seafood and shellfish. Lean meats. Poultry. Tofu. Dairy Low-fat or fat-free dairy products, such as yogurt, cottage cheese, and cheese. Beverages Water. Sugar-free drinks. Tea. Coffee. Low-fat or skim milk. Milk alternatives, such as soy or almond milk. Real fruit juice. Condiments Mustard. Relish. Low-fat, low-sugar ketchup and barbecue sauce. Low-fat or fat-free mayonnaise. Sweets and Desserts Sugar-free sweets. Fats and Oils Avocado. Canola or olive oil. Nuts and nut butters. Seeds. The items listed above may not be a complete list of recommended foods or beverages. Contact your dietitian for more options. What foods are not recommended? Palm oil and coconut oil. Processed foods. Fried foods. Sweetened drinks, such as sweet tea, milkshakes, snow cones, iced sweet drinks, and sodas. Alcohol. Sweets. Foods that contain a lot of salt or sodium. The items listed above may not be a complete list of foods and beverages to avoid. Contact your dietitian for more information. This information is not intended to replace advice given to you by your health care provider. Make sure you discuss any questions you have with your health care provider. Document Released: 03/27/2015 Document Revised: 04/17/2016 Document Reviewed: 12/05/2014 Elsevier Interactive Patient Education  2019 Else[employer] Inc.  Fatty Liver Disease  Fatty liver disease occurs when too much fat has built up in your liver cells. Fatty liver disease is also called hepatic steatosis or steatohepatitis. The liver removes harmful substances from your bloodstream and produces fluids that your body needs. It also helps your body use and store energy from the food you eat. In many cases, fatty liver disease does not cause symptoms or problems. It is often diagnosed when tests are  being done for other reasons. However, over time, fatty liver can cause inflammation that may lead to more serious liver problems, such as scarring of the liver (cirrhosis)  and liver failure. Fatty liver is associated with insulin resistance, increased body fat, high blood pressure (hypertension), and high cholesterol. These are features of metabolic syndrome and increase your risk for stroke, diabetes, and heart disease. What are the causes? This condition may be caused by:  Drinking too much alcohol.  Poor nutrition.  Obesity.  Cushing's syndrome.  Diabetes.  High cholesterol.  Certain drugs.  Poisons.  Some viral infections.  Pregnancy. What increases the risk? You are more likely to develop this condition if you:  Abuse alcohol.  Are overweight.  Have diabetes.  Have hepatitis.  Have a high triglyceride level.  Are pregnant. What are the signs or symptoms? Fatty liver disease often does not cause symptoms. If symptoms do develop, they can include:  Fatigue.  Weakness.  Weight loss.  Confusion.  Abdominal pain.  Nausea and vomiting.  Yellowing of your skin and the white parts of your eyes (jaundice).  Itchy skin. How is this diagnosed? This condition may be diagnosed by:  A physical exam and medical history.  Blood tests.  Imaging tests, such as an ultrasound, CT scan, or MRI.  A liver biopsy. A small sample of liver tissue is removed using a needle. The sample is then looked at under a microscope. How is this treated? Fatty liver disease is often caused by other health conditions. Treatment for fatty liver may involve medicines and lifestyle changes to manage conditions such as:  Alcoholism.  High cholesterol.  Diabetes.  Being overweight or obese. Follow these instructions at home:   Do not drink alcohol. If you have trouble quitting, ask your health care provider how to safely quit with the help of medicine or a supervised program.  This is important to keep your condition from getting worse.  Eat a healthy diet as told by your health care provider. Ask your health care provider about working with a diet and nutrition specialist (dietitian) to develop an eating plan.  Exercise regularly. This can help you lose weight and control your cholesterol and diabetes. Talk to your health care provider about an exercise plan and which activities are best for you.  Take over-the-counter and prescription medicines only as told by your health care provider.  Keep all follow-up visits as told by your health care provider. This is important. Contact a health care provider if: You have trouble controlling your:  Blood sugar. This is especially important if you have diabetes.  Cholesterol.  Drinking of alcohol. Get help right away if:  You have abdominal pain.  You have jaundice.  You have nausea and vomiting.  You vomit blood or material that looks like coffee grounds.  You have stools that are black, tar-like, or bloody. Summary  Fatty liver disease develops when too much fat builds up in the cells of your liver.  Fatty liver disease often causes no symptoms or problems. However, over time, fatty liver can cause inflammation that may lead to more serious liver problems, such as scarring of the liver (cirrhosis).  You are more likely to develop this condition if you abuse alcohol, are pregnant, are overweight, have diabetes, have hepatitis, or have high triglyceride levels.  Contact your health care provider if you have trouble controlling your weight, blood sugar, cholesterol, or drinking of alcohol. This information is not intended to replace advice given to you by your health care provider. Make sure you discuss any questions you have with your health care provider. Document Released: 12/26/2005 Document Revised: 08/19/2017 Document Reviewed: 08/19/2017  Elsevier Interactive Patient Education  2019 [employer].  Hepatitis B Vaccine, Recombinant injection What is this medicine? HEPATITIS B VACCINE (hep uh TAHY tis B VAK seen) is a vaccine. It is used to prevent an infection with the hepatitis B virus. This medicine may be used for other purposes; ask your health care provider or pharmacist if you have questions. COMMON BRAND NAME(S): Engerix-B, Recombivax HB What should I tell my health care provider before I take this medicine? They need to know if you have any of these conditions: -fever, infection -heart disease -hepatitis B infection -immune system problems -kidney disease -an unusual or allergic reaction to vaccines, yeast, other medicines, foods, dyes, or preservatives -pregnant or trying to get pregnant -breast-feeding How should I use this medicine? This vaccine is for injection into a muscle. It is given by a health care professional. A copy of Vaccine Information Statements will be given before each vaccination. Read this sheet carefully each time. The sheet may change frequently. Talk to your pediatrician regarding the use of this medicine in children. While this drug may be prescribed for children as young as newborn for selected conditions, precautions do apply. Overdosage: If you think you have taken too much of this medicine contact a poison control center or emergency room at once. NOTE: This medicine is only for you. Do not share this medicine with others. What if I miss a dose? It is important not to miss your dose. Call your doctor or health care professional if you are unable to keep an appointment. What may interact with this medicine? -medicines that suppress your immune function like adalimumab, anakinra, infliximab -medicines to treat cancer -steroid medicines like prednisone or cortisone This list may not describe all possible interactions. Give your health care provider a list of all the medicines, herbs, non-prescription drugs, or dietary supplements you use. Also  tell them if you smoke, drink alcohol, or use illegal drugs. Some items may interact with your medicine. What should I watch for while using this medicine? See your health care provider for all shots of this vaccine as directed. You must have 3 shots of this vaccine for protection from hepatitis B infection. Tell your doctor right away if you have any serious or unusual side effects after getting this vaccine. What side effects may I notice from receiving this medicine? Side effects that you should report to your doctor or health care professional as soon as possible: -allergic reactions like skin rash, itching or hives, swelling of the face, lips, or tongue -breathing problems -confused, irritated -fast, irregular heartbeat -flu-like syndrome -numb, tingling pain -seizures -unusually weak or tired Side effects that usually do not require medical attention (report to your doctor or health care professional if they continue or are bothersome): -diarrhea -fever -headache -loss of appetite -muscle pain -nausea -pain, redness, swelling, or irritation at site where injected -tiredness This list may not describe all possible side effects. Call your doctor for medical advice about side effects. You may report side effects to FDA at 1-800-FDA-1088. Where should I keep my medicine? This drug is given in a hospital or clinic and will not be stored at home. NOTE: This sheet is a summary. It may not cover all possible information. If you have questions about this medicine, talk to your doctor, pharmacist, or health care provider.  2019 Elsevier/Gold Standard (2014-03-13 13:26:01)    Vitamin B12 Deficiency Vitamin B12 deficiency occurs when the body does not have enough vitamin B12. Vitamin B12 is  an important vitamin. The body needs vitamin B12:  To make red blood cells.  To make DNA. This is the genetic material inside cells.  To help the nerves work properly so they can carry messages  from the brain to the body. Vitamin B12 deficiency can cause various health problems, such as a low red blood cell count (anemia) or nerve damage. What are the causes? This condition may be caused by:  Not eating enough foods that contain vitamin B12.  Not having enough stomach acid and digestive fluids to properly absorb vitamin B12 from the food that you eat.  Certain digestive system diseases that make it hard to absorb vitamin B12. These diseases include Crohn disease, chronic pancreatitis, and cystic fibrosis.  Pernicious anemia. This is a condition in which the body does not make enough of a protein (intrinsic factor), resulting in too few red blood cells.  Having a surgery in which part of the stomach or small intestine is removed.  Taking certain medicines that make it hard for the body to absorb vitamin B12. These medicines include: ? Heartburn medicine (antacids and proton pump inhibitors). ? An antibiotic medicine called neomycin. ? Some medicines that are used to treat diabetes, tuberculosis, gout, or high cholesterol. What increases the risk? The following factors may make you more likely to develop a B12 deficiency:  Being older than age 18.  Eating a vegetarian or vegan diet, especially while you are pregnant.  Eating a poor diet while you are pregnant.  Taking certain drugs.  Having alcoholism. What are the signs or symptoms? In some cases, there are no symptoms of this condition. If the condition leads to anemia or nerve damage, various symptoms can occur, such as:  Weakness.  Fatigue.  Loss of appetite.  Weight loss.  Numbness or tingling in your hands and feet.  Redness and burning of the tongue.  Confusion or memory problems.  Depression.  Sensory problems, such as color blindness, ringing in the ears, or loss of taste.  Diarrhea or constipation.  Trouble walking. If anemia is severe, symptoms can include:  Shortness of  breath.  Dizziness.  Rapid heart rate (tachycardia).  How is this diagnosed? This condition may be diagnosed with a blood test to measure the level of vitamin B12 in your blood. You may have other tests to help find the cause of your vitamin B12 deficiency. These tests may include:  A complete blood count (CBC). This is a group of tests that measure certain characteristics of blood cells.  A blood test to measure intrinsic factor.  An endoscopy. In this procedure, a thin tube with a camera on the end is used to look into your stomach or intestines. How is this treated? Treatment for this condition depends on the cause. Common treatment options include:  Changing your eating and drinking habits, such as: ? Eating more foods that contain vitamin B12. ? Drinking less alcohol or no alcohol.  Taking vitamin B12 supplements. Your health care provider will tell you which dosage is best for you.  Getting vitamin B12 injections. Follow these instructions at home:  Take supplements only as told by your health care provider. Follow the directions carefully.  Get any injections that are prescribed by your health care provider.  Do not miss your appointments.  Eat lots of healthy foods that contain vitamin B12. Ask your health care provider if you should work with a dietitian. Foods that contain vitamin B12 include: ? Meat. ? Meat from birds (  poultry). ? Fish. ? Eggs. ? Cereal and dairy products that are fortified. This means that vitamin B12 has been added to the food. Check the label on the package to see if the food is fortified.  Do not abuse alcohol.  Keep all follow-up visits as told by your health care provider. This is important. Contact a health care provider if:  Your symptoms come back. Get help right away if:  You develop shortness of breath.  You have chest pain.  You become dizzy or you lose consciousness. This information is not intended to replace advice given to  you by your health care provider. Make sure you discuss any questions you have with your health care provider. Document Released: 02/02/2012 Document Revised: 04/23/2016 Document Reviewed: 03/28/2015 Elsevier Interactive Patient Education  2019 [employer].

## 2018-12-16 NOTE — Progress Notes (Signed)
Chief Complaint  Patient presents with  . 2 Week Follow-up   F/u  1. DM 2 A1C 7.6 likely dietary noncompliance at times he is on 70/30 150/60 but per endocrine change to 150/50 due to low cbgs in the 60s in the am  2. Constipation did not like linzess 3. HTN controlled losartan 100 mg hctz 12.5 mg qd and toprol 50 mg xl  4. Reviewed labs B12 and vitamin D low   Review of Systems  Constitutional: Negative for weight loss.  HENT: Negative for hearing loss.   Eyes: Negative for blurred vision.  Respiratory: Negative for shortness of breath.   Cardiovascular: Negative for chest pain.  Gastrointestinal: Negative for abdominal pain.  Musculoskeletal: Negative for falls.  Skin: Negative for rash.  Neurological: Negative for headaches.   Past Medical History:  Diagnosis Date  . Adenomatous colon polyp 06/2001  . ALLERGIC RHINITIS 08/03/2007  . Allergy   . Asthma   . BENIGN PROSTATIC HYPERTROPHY 08/03/2007  . Chronic back pain   . COPD (chronic obstructive pulmonary disease) ([place])   . 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 Diabetes mellitus without complication ([place])    x 16-60 years as of 11/08/18   . DIABETIC RETINOPATHY, BACKGROUND 09/29/2008  . DYSPHAGIA UNSPECIFIED 09/29/2008  . 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 GERD 11/28/2008  . HYPERLIPIDEMIA 08/03/2007  . Kidney stones    Dr. Boneta Lucks  . 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 PAIN IN SOFT TISSUES OF LIMB 01/07/2008  . UNSPECIFIED ANEMIA 12/29/2007  . Unspecified essential hypertension 01/07/2008  . URINARY CALCULUS 09/29/2008  . Urine incontinence    Past Surgical History:  Procedure Laterality Date  . CHOLECYSTECTOMY N/A 11/12/2016   Procedure: LAPAROSCOPIC CHOLECYSTECTOMY WITH INTRAOPERATIVE CHOLANGIOGRAM;  Surgeon: Clayburn Pert, MD;  Location: [facility] ORS;  Service: General;  Laterality: N/A;  . HAND SURGERY     2016  . LITHOTRIPSY    . SHOULDER SURGERY     left, cyst and tumor removed   Family History   Problem Relation Age of Onset  . Diabetes Mother   . Depression Mother   . Prostate cancer Brother   . Diabetes Brother        x 3 brother  . Heart disease Brother    Social History   Socioeconomic History  . Marital status: Married    Spouse name: Not on file  . Number of children: 2  . Years of education: Not on file  . Highest education level: Not on file  Occupational History  . 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 Financial resource strain: Not on file  . Food insecurity:    Worry: Not on file    Inability: Not on file  . Transportation needs:    Medical: Not on file    Non-medical: Not on file  Tobacco Use  . Smoking status: Never Smoker  . Smokeless tobacco: Never Used  Substance and Sexual Activity  . Alcohol use: Yes    Comment: occasional  . Drug use: No  . Sexual activity: Not on file  Lifestyle  . Physical activity:    Days per week: Not on file    Minutes per session: Not on file  . Stress: Not on file  Relationships  . Social connections:    Talks on phone: Not on file    Gets together: Not on file    Attends religious service: Not on file  Active member of club or organization: Not on file    Attends meetings of clubs or organizations: Not on file    Relationship status: Not on file  . Intimate partner violence:    Fear of current or ex partner: Not on file    Emotionally abused: Not on file    Physically abused: Not on file    Forced sexual activity: Not on file  Other Topics Concern  . 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 aspirin 81 MG tablet Take 81 mg by mouth daily.  . Dutasteride-Tamsulosin HCl 0.5-0.4 MG CAPS Take 0.4 mg by mouth daily.  . hydrochlorothiazide (HYDRODIURIL) 12.5 MG tablet Take 1 tablet (12.5 mg total) by mouth daily. In am  inform pt combination not available  . insulin NPH-regular Human (NOVOLIN 70/30 RELION) (70-30) 100 UNIT/ML injection 150 units with breakfast, and 50 units with supper.  . Insulin Pen Needle 31G X 5 MM MISC Use as directed 2 X daily. BD ULTRAPFINE MINI. Dx: 250.00  . lactulose (CHRONULAC) 10 GM/15ML solution TAKE 30 ML  ( 2 TABLESPOONSFUL) BY MOUTHTWICE A DAY  . Lancets (ONETOUCH ULTRASOFT) lancets Use as instructed 2x daily Dx: 250.00  . losartan (COZAAR) 100 MG tablet Take 1 tablet (100 mg total) by mouth daily. In am inform pt combination not available  . losartan-hydrochlorothiazide (HYZAAR) 100-12.5 MG tablet Take 1 tablet by mouth daily.  Marland Kitchen MAGNESIUM PO Take 1 tablet by mouth daily.   . metoprolol succinate (TOPROL-XL) 50 MG 24 hr tablet Take 1 tablet (50 mg total) by mouth daily.  . NON FORMULARY Prostate Peanut from [place].  Marland Kitchen omeprazole (PRILOSEC) 40 MG capsule Take 1 capsule (40 mg total) by mouth daily.  . ONE TOUCH ULTRA TEST test strip TEST TWICE DAILY  . simvastatin (ZOCOR) 40 MG tablet Take 1 tablet (40 mg total) by mouth at bedtime.  . traZODone (DESYREL) 50 MG tablet Take 1 tablet (50 mg total) by mouth at bedtime.  . vitamin E 400 UNIT capsule Take 400 Units by mouth daily.   Allergies  Allergen Reactions  . Sulfamethoxazole-Trimethoprim Rash   Recent Results (from the past 2160 hour(s))  POCT HgB A1C     Status: Abnormal   Collection Time: 12/01/18  9:34 AM  Result Value Ref Range   Hemoglobin A1C 6.9 (A) 4.0 - 5.6 %   HbA1c POC (<> result, manual entry)     HbA1c, POC (prediabetic range)     HbA1c, POC (controlled diabetic range)    Comprehensive metabolic panel     Status: Abnormal   Collection Time: 12/02/18  8:03 AM  Result Value Ref Range   Sodium 140 135 - 145 mEq/L   Potassium 4.3 3.5 - 5.1 mEq/L   Chloride 106 96 - 112 mEq/L   CO2 26 19 - 32 mEq/L   Glucose, Bld 137 (H) 70 - 99 mg/dL   BUN 17 6 - 23 mg/dL   Creatinine, Ser 1.30 0.40 - 1.50  mg/dL   Total Bilirubin 0.6 0.2 - 1.2 mg/dL   Alkaline Phosphatase 52 39 - 117 U/L   AST 21 0 - 37 U/L   ALT 25 0 - 53 U/L   Total Protein 6.8 6.0 - 8.3 g/dL  Albumin 4.0 3.5 - 5.2 g/dL   Calcium 9.3 8.4 - 10.5 mg/dL   GFR 57.91 (L) >60.00 mL/min  CBC with Differential/Platelet     Status: Abnormal   Collection Time: 12/02/18  8:03 AM  Result Value Ref Range   WBC 6.1 4.0 - 10.5 K/uL   RBC 5.16 4.22 - 5.81 Mil/uL   Hemoglobin 16.5 13.0 - 17.0 g/dL   HCT 48.8 39.0 - 52.0 %   MCV 94.6 78.0 - 100.0 fl   MCHC 33.9 30.0 - 36.0 g/dL   RDW 13.5 11.5 - 15.5 %   Platelets 197.0 150.0 - 400.0 K/uL   Neutrophils Relative % 42.8 (L) 43.0 - 77.0 %   Lymphocytes Relative 37.5 12.0 - 46.0 %   Monocytes Relative 12.6 (H) 3.0 - 12.0 %   Eosinophils Relative 6.0 (H) 0.0 - 5.0 %   Basophils Relative 1.1 0.0 - 3.0 %   Neutro Abs 2.6 1.4 - 7.7 K/uL   Lymphs Abs 2.3 0.7 - 4.0 K/uL   Monocytes Absolute 0.8 0.1 - 1.0 K/uL   Eosinophils Absolute 0.4 0.0 - 0.7 K/uL   Basophils Absolute 0.1 0.0 - 0.1 K/uL  Hemoglobin A1c     Status: Abnormal   Collection Time: 12/02/18  8:03 AM  Result Value Ref Range   Hgb A1c MFr Bld 7.6 (H) 4.6 - 6.5 %    Comment: Glycemic Control Guidelines for People with Diabetes:Non Diabetic:  <6%Goal of Therapy: <7%Additional Action Suggested:  >8%   Lipid panel     Status: Abnormal   Collection Time: 12/02/18  8:03 AM  Result Value Ref Range   Cholesterol 86 0 - 200 mg/dL    Comment: ATP III Classification       Desirable:  < 200 mg/dL               Borderline High:  200 - 239 mg/dL          High:  > = 240 mg/dL   Triglycerides 197.0 (H) 0.0 - 149.0 mg/dL    Comment: Normal:  <150 mg/dLBorderline High:  150 - 199 mg/dL   HDL 29.60 (L) >39.00 mg/dL   VLDL 39.4 0.0 - 40.0 mg/dL   LDL Cholesterol 17 0 - 99 mg/dL   Total CHOL/HDL Ratio 3     Comment:                Men          Women1/2 Average Risk     3.4          3.3Average Risk          5.0          4.42X Average Risk           9.6          7.13X Average Risk          15.0          11.0                       NonHDL 56.83     Comment: NOTE:  Non-HDL goal should be 30 mg/dL higher than patient's LDL goal (i.e. LDL goal of < 70 mg/dL, would have non-HDL goal of < 100 mg/dL)  TSH     Status: None   Collection Time: 12/02/18  8:03 AM  Result Value Ref Range   TSH 0.96 0.35 - 4.50 uIU/mL  B12  Status: Abnormal   Collection Time: 12/02/18  8:03 AM  Result Value Ref Range   Vitamin B-12 173 (L) 211 - 911 pg/mL  Hepatitis B surface antibody,quantitative     Status: Abnormal   Collection Time: 12/02/18  8:03 AM  Result Value Ref Range   Hepatitis B-Post <5 (L) > OR = 10 mIU/mL    Comment: . Patient does not have immunity to hepatitis B virus. . For additional information, please refer to http://education.[facility].com/faq/FAQ105 (This link is being provided for informational/ educational purposes only).   VITAMIN D 25 Hydroxy (Vit-D Deficiency, Fractures)     Status: Abnormal   Collection Time: 12/02/18  8:03 AM  Result Value Ref Range   VITD 22.94 (L) 30.00 - 100.00 ng/mL  Hepatitis B surface antigen     Status: None   Collection Time: 12/02/18  8:03 AM  Result Value Ref Range   Hepatitis B Surface Ag NON-REACTIVE NON-REACTI  Hepatitis A Ab, Total     Status: Abnormal   Collection Time: 12/02/18  8:03 AM  Result Value Ref Range   Hepatitis A AB,Total REACTIVE (A) NON-REACTI    Comment: For additional information, please refer to  http://education.[facility].com/faq/FAQ202  (This link is being provided for informational/ educational purposes only.)   Testosterone     Status: None   Collection Time: 12/02/18  8:03 AM  Result Value Ref Range   Testosterone 336.16 300.00 - 890.00 ng/dL  Urinalysis, Routine w reflex microscopic     Status: None   Collection Time: 12/02/18  8:03 AM  Result Value Ref Range   Specific Gravity, UA 1.018 1.005 - 1.030   pH, UA 5.5 5.0 - 7.5   Color, UA  Yellow Yellow   Appearance Ur Clear Clear   Leukocytes, UA Negative Negative   Protein, UA Negative Negative/Trace   Glucose, UA Negative Negative   Ketones, UA Negative Negative   RBC, UA Negative Negative   Bilirubin, UA Negative Negative   Urobilinogen, Ur 1.0 0.2 - 1.0 mg/dL   Nitrite, UA Negative Negative   Microscopic Examination Comment     Comment: Microscopic not indicated and not performed.   Objective  Body mass index is 31.33 kg/m. Wt Readings from Last 3 Encounters:  12/16/18 231 lb (104.8 kg)  12/01/18 231 lb 6.4 oz (105 kg)  11/09/18 233 lb 9.6 oz (106 kg)   Temp Readings from Last 3 Encounters:  12/16/18 98 F (36.7 C) (Oral)  11/09/18 98.3 F (36.8 C) (Oral)  05/24/18 98.2 F (36.8 C) (Oral)   BP Readings from Last 3 Encounters:  12/16/18 134/68  12/01/18 (!) 144/74  11/09/18 (!) 150/70   Pulse Readings from Last 3 Encounters:  12/16/18 76  12/01/18 88  11/09/18 81    Physical Exam Vitals signs and nursing note reviewed.  Constitutional:      Appearance: Normal appearance. He is well-developed. He is obese.  HENT:     Head: Normocephalic and atraumatic.     Mouth/Throat:     Mouth: Mucous membranes are moist.     Pharynx: Oropharynx is clear.  Eyes:     Conjunctiva/sclera: Conjunctivae normal.     Pupils: Pupils are equal, round, and reactive to light.  Cardiovascular:     Rate and Rhythm: Normal rate and regular rhythm.     Pulses: Normal pulses.     Heart sounds: Normal heart sounds. No murmur.  Pulmonary:     Effort: Pulmonary effort is normal.     Breath  sounds: Normal breath sounds.  Skin:    General: Skin is warm and dry.  Neurological:     General: No focal deficit present.     Mental Status: He is alert and oriented to person, place, and time. Mental status is at baseline.     Gait: Gait normal.  Psychiatric:        Attention and Perception: Attention and perception normal.        Mood and Affect: Mood and affect normal.         Speech: Speech normal.        Behavior: Behavior normal. Behavior is cooperative.        Thought Content: Thought content normal.        Cognition and Memory: Cognition and memory normal.        Judgment: Judgment normal.     Assessment   1. DM 2 A1C 7.6  2. HTN controlled today  3. b12 def  4. Vit D def  5. Chronic constipation  6. HM Plan   1. Cont meds advised 70/30 change to 150/50 due to lows  Given low info  Disc cinnamon capsules  F/u me or endocrine recheck labs at f/u 2. Cont meds 3. B12 shot today  4. D3 5000 IU daily otc  5. Likes lactolose more than linzess  6.  Flu shot utd  Tdap utd 08/31/18  prevnar and pna 23 utd  Consider shingrix vaccine in future  PSA-pt due to f/u with Dr. Rogers Blocker   Hep C negative 07/09/16, check hep A/B with fatty liver  rec hep B vaccine   Never smoker except for cigars  GI Dr. Fuller Plan 11/02/13 small IH f/u in 5 years referral sent  Dermatology established with [facility] last seen 12/02/17  Dr. Raliegh Ip cards appt 12/2018  Provider: Dr. Olivia Mackie McLean-Scocuzza-[facility] Medicine

## 2018-12-17 ENCOUNTER — Encounter: Payer: Self-pay | Admitting: Internal Medicine

## 2018-12-17 DIAGNOSIS — E559 Vitamin D deficiency, unspecified: Secondary | ICD-10-CM | POA: Insufficient documentation

## 2019-01-01 ENCOUNTER — Other Ambulatory Visit: Payer: Self-pay | Admitting: Endocrinology

## 2019-01-03 ENCOUNTER — Encounter: Payer: Self-pay | Admitting: Gastroenterology

## 2019-01-03 DIAGNOSIS — I10 Essential (primary) hypertension: Secondary | ICD-10-CM | POA: Diagnosis not present

## 2019-01-03 DIAGNOSIS — E782 Mixed hyperlipidemia: Secondary | ICD-10-CM | POA: Diagnosis not present

## 2019-01-03 DIAGNOSIS — Z01818 Encounter for other preprocedural examination: Secondary | ICD-10-CM | POA: Diagnosis not present

## 2019-01-03 DIAGNOSIS — I2510 Atherosclerotic heart disease of native coronary artery without angina pectoris: Secondary | ICD-10-CM | POA: Diagnosis not present

## 2019-01-11 ENCOUNTER — Other Ambulatory Visit: Payer: Self-pay | Admitting: Endocrinology

## 2019-01-18 ENCOUNTER — Ambulatory Visit (INDEPENDENT_AMBULATORY_CARE_PROVIDER_SITE_OTHER): Payer: PPO

## 2019-01-18 DIAGNOSIS — E538 Deficiency of other specified B group vitamins: Secondary | ICD-10-CM | POA: Diagnosis not present

## 2019-01-18 MED ORDER — CYANOCOBALAMIN 1000 MCG/ML IJ SOLN
1000.0000 ug | Freq: Once | INTRAMUSCULAR | Status: AC
  Administered 2019-01-18: 1000 ug via INTRAMUSCULAR

## 2019-01-18 NOTE — Progress Notes (Signed)
Patient came in today for B-12 injection in right deltoid. Patient tolerated well.  

## 2019-01-20 ENCOUNTER — Ambulatory Visit: Payer: PPO | Admitting: Gastroenterology

## 2019-01-20 ENCOUNTER — Encounter: Payer: Self-pay | Admitting: Gastroenterology

## 2019-01-20 VITALS — BP 120/56 | HR 68 | Ht 71.25 in | Wt 231.2 lb

## 2019-01-20 DIAGNOSIS — K2100 Gastro-esophageal reflux disease with esophagitis, without bleeding: Secondary | ICD-10-CM

## 2019-01-20 DIAGNOSIS — Z86010 Personal history of colonic polyps: Secondary | ICD-10-CM

## 2019-01-20 DIAGNOSIS — K5904 Chronic idiopathic constipation: Secondary | ICD-10-CM | POA: Diagnosis not present

## 2019-01-20 MED ORDER — NA SULFATE-K SULFATE-MG SULF 17.5-3.13-1.6 GM/177ML PO SOLN: 1.0000 | Freq: Once | ORAL | 0 refills | Status: AC

## 2019-01-20 NOTE — Progress Notes (Signed)
History of Present Illness: This is a 73 year old male referred by McLean-Scocuzza, Olivia Mackie * MD for the evaluation of personal history of adenomatous colon polyps and ongoing difficulties with constipation.  He states he tried samples of Linzess and they were ineffective.  He was prescribed lactulose twice daily which was effective in relieving constipation but led to increased intestinal gas so he has reduced dosing to once daily.  With this regimen he has a bowel movement about every other day.  He is unsure whether he is getting a complete evacuation.  No other gastrointestinal complaints. Denies weight loss, abdominal pain, diarrhea, change in stool caliber, melena, hematochezia, nausea, vomiting, dysphagia, reflux symptoms, chest pain.   Allergies  Allergen Reactions  . Sulfamethoxazole-Trimethoprim Rash   Outpatient Medications Prior to Visit  Medication Sig Dispense Refill  . aspirin 81 MG tablet Take 81 mg by mouth daily.    . Dutasteride-Tamsulosin HCl 0.5-0.4 MG CAPS Take 0.4 mg by mouth daily.    . hydrochlorothiazide (HYDRODIURIL) 12.5 MG tablet Take 1 tablet (12.5 mg total) by mouth daily. In am inform pt combination not available 90 tablet 3  . Insulin Pen Needle 31G X 5 MM MISC Use as directed 2 X daily. BD ULTRAPFINE MINI. Dx: 250.00 100 each 5  . lactulose (CHRONULAC) 10 GM/15ML solution TAKE 30 ML  ( 2 TABLESPOONSFUL) BY MOUTHTWICE A DAY 1892 mL 11  . Lancets (ONETOUCH ULTRASOFT) lancets Use as instructed 2x daily Dx: 250.00 100 each 5  . losartan (COZAAR) 100 MG tablet Take 1 tablet (100 mg total) by mouth daily. In am inform pt combination not available 90 tablet 3  . losartan-hydrochlorothiazide (HYZAAR) 100-12.5 MG tablet Take 1 tablet by mouth daily. 90 tablet 3  . MAGNESIUM PO Take 1 tablet by mouth daily.     . metoprolol succinate (TOPROL-XL) 50 MG 24 hr tablet Take 1 tablet (50 mg total) by mouth daily. 90 tablet 1  . NON FORMULARY Prostate Peanut from [place].    Marland Kitchen NOVOLIN 70/30 RELION (70-30) 100 UNIT/ML injection INJECT 150 UNITS SUBCUTANEOUSLY WITH BREAKFAST AND 60 UNITS WITH SUPPER. 80 mL 0  . omeprazole (PRILOSEC) 40 MG capsule Take 1 capsule (40 mg total) by mouth daily. 90 capsule 1  . ONE TOUCH ULTRA TEST test strip TEST TWICE DAILY 100 each 9  . simvastatin (ZOCOR) 40 MG tablet Take 1 tablet (40 mg total) by mouth at bedtime. 90 tablet 3  . tamsulosin (FLOMAX) 0.4 MG CAPS capsule Take 1 capsule by mouth daily.    . traZODone (DESYREL) 50 MG tablet Take 1 tablet (50 mg total) by mouth at bedtime. 90 tablet 2  . vitamin E 400 UNIT capsule Take 400 Units by mouth daily.     No facility-administered medications prior to visit.    Past Medical History:  Diagnosis Date  . Adenomatous colon polyp 06/2001  . ALLERGIC RHINITIS 08/03/2007  . Allergy   . Asthma   . BENIGN PROSTATIC HYPERTROPHY 08/03/2007  . Chronic back pain   . COPD (chronic obstructive pulmonary disease) ([place])   . 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 Diabetes mellitus without complication ([place])    x 63-84 years as of 11/08/18   . DIABETIC RETINOPATHY, BACKGROUND 09/29/2008  . DYSPHAGIA UNSPECIFIED 09/29/2008  . 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 GERD 11/28/2008  . HYPERLIPIDEMIA 08/03/2007  . Kidney stones    Dr. Boneta Lucks  . 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  PAIN IN SOFT TISSUES OF LIMB 01/07/2008  . UNSPECIFIED ANEMIA 12/29/2007  . Unspecified essential hypertension 01/07/2008  . URINARY CALCULUS 09/29/2008  . Urine incontinence    Past Surgical History:  Procedure Laterality Date  . CHOLECYSTECTOMY N/A 11/12/2016   Procedure: LAPAROSCOPIC CHOLECYSTECTOMY WITH INTRAOPERATIVE CHOLANGIOGRAM;  Surgeon: Clayburn Pert, MD;  Location: [facility] ORS;  Service: General;  Laterality: N/A;  . HAND SURGERY Right    2016  . LITHOTRIPSY    . SHOULDER SURGERY Left    left, cyst and tumor removed   Social History   Socioeconomic History  . Marital  status: Married    Spouse name: Not on file  . Number of children: 2  . Years of education: Not on file  . Highest education level: Not on file  Occupational History  . 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 Financial resource strain: Not on file  . Food insecurity:    Worry: Not on file    Inability: Not on file  . Transportation needs:    Medical: Not on file    Non-medical: Not on file  Tobacco Use  . Smoking status: Never Smoker  . Smokeless tobacco: Never Used  Substance and Sexual Activity  . Alcohol use: Yes    Comment: occasional beer  . Drug use: No  . Sexual activity: Not on file  Lifestyle  . Physical activity:    Days per week: Not on file    Minutes per session: Not on file  . Stress: Not on file  Relationships  . Social connections:    Talks on phone: Not on file    Gets together: Not on file    Attends religious service: Not on file    Active member of club or organization: Not on file    Attends meetings of clubs or organizations: Not on file    Relationship status: Not on file  Other Topics Concern  . 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 Diabetes Mother   . Depression Mother   . Prostate cancer Brother   . Diabetes Brother   . Heart disease Brother   . Diabetes Brother        Review of Systems: Pertinent positive and negative review of systems were noted in the above HPI section. All other review of systems were otherwise negative.    Physical Exam: General: Well developed, well nourished, no acute distress Head: Normocephalic and atraumatic Eyes:  sclerae anicteric, EOMI Ears: Normal auditory acuity Mouth: No deformity or lesions Neck: Supple, no masses or thyromegaly Lungs: Clear throughout to auscultation Heart: Regular rate  and rhythm; no murmurs, rubs or bruits Abdomen: Soft, non tender and non distended. No masses, hepatosplenomegaly or hernias noted. Normal Bowel sounds Rectal: Deferred to colonosocpy Musculoskeletal: Symmetrical with no gross deformities  Skin: No lesions on visible extremities Pulses:  Normal pulses noted Extremities: No clubbing, cyanosis, edema or deformities noted Neurological: Alert oriented x 4, grossly nonfocal Cervical Nodes:  No significant cervical adenopathy Inguinal Nodes: No significant inguinal adenopathy Psychological:  Alert and cooperative. Normal mood and affect   Assessment and Recommendations:  1. Personal history of adenomatous colon polyps.  He is due for 5-year interval  surveillance colonoscopy.  Schedule colonoscopy. The risks (including bleeding, perforation, infection, missed lesions, medication reactions and possible hospitalization or surgery if complications occur), benefits, and alternatives to colonoscopy with possible biopsy and possible polypectomy were discussed with the patient and they consent to proceed.   2. Constipation.  Discontinue lactulose given intestinal gas side effects.  Begin MiraLAX 2-3 times daily titrated for adequate bowel movements.  If this is not effective consider Trulance, Zelmorm, etc.   3. GERD with history of esophagitis and esophageal stricture.  Follow standard antireflux measures and continue omeprazole 40 mg daily.  4. DM.  Standard adjustments for insulin for bowel prep, colonoscopy.    cc: McLean-Scocuzza, Nino Glow, MD [place], [place] [address]

## 2019-01-20 NOTE — Addendum Note (Signed)
Addended by: Marzella Schlein on: 01/20/2019 01:26 PM   Modules accepted: Orders

## 2019-01-20 NOTE — Patient Instructions (Signed)
Stop taking lactulose.   Start over the counter Miralax mixing 17 grams in 8 oz of water 2-3 x daily. Also start Benefiber daily.   Increase your water intake daily.   You have been scheduled for a colonoscopy. Please follow written instructions given to you at your visit today.  Please pick up your prep supplies at the pharmacy within the next 1-3 days. If you use inhalers (even only as needed), please bring them with you on the day of your procedure. Your physician has requested that you go to www.startemmi.com and enter the access code given to you at your visit today. This web site gives a general overview about your procedure. However, you should still follow specific instructions given to you by our office regarding your preparation for the procedure.  Normal BMI (Body Mass Index- based on height and weight) is between 23 and 30. Your BMI today is Body mass index is 32.03 kg/m. Marland Kitchen Please consider follow up  regarding your BMI with your Primary Care Provider.  Thank you for choosing me and [facility].  Pricilla Riffle. Dagoberto Ligas., MD., Marval Regal

## 2019-01-24 ENCOUNTER — Ambulatory Visit (AMBULATORY_SURGERY_CENTER): Payer: PPO | Admitting: Gastroenterology

## 2019-01-24 ENCOUNTER — Encounter: Payer: Self-pay | Admitting: Gastroenterology

## 2019-01-24 VITALS — BP 114/68 | HR 64 | Temp 98.0°F | Resp 16 | Ht 71.0 in | Wt 231.0 lb

## 2019-01-24 DIAGNOSIS — E119 Type 2 diabetes mellitus without complications: Secondary | ICD-10-CM | POA: Diagnosis not present

## 2019-01-24 DIAGNOSIS — D120 Benign neoplasm of cecum: Secondary | ICD-10-CM | POA: Diagnosis not present

## 2019-01-24 DIAGNOSIS — E785 Hyperlipidemia, unspecified: Secondary | ICD-10-CM | POA: Diagnosis not present

## 2019-01-24 DIAGNOSIS — D123 Benign neoplasm of transverse colon: Secondary | ICD-10-CM

## 2019-01-24 DIAGNOSIS — Z86010 Personal history of colonic polyps: Secondary | ICD-10-CM

## 2019-01-24 DIAGNOSIS — K219 Gastro-esophageal reflux disease without esophagitis: Secondary | ICD-10-CM | POA: Diagnosis not present

## 2019-01-24 DIAGNOSIS — I739 Peripheral vascular disease, unspecified: Secondary | ICD-10-CM | POA: Diagnosis not present

## 2019-01-24 DIAGNOSIS — I10 Essential (primary) hypertension: Secondary | ICD-10-CM | POA: Diagnosis not present

## 2019-01-24 DIAGNOSIS — J449 Chronic obstructive pulmonary disease, unspecified: Secondary | ICD-10-CM | POA: Diagnosis not present

## 2019-01-24 MED ORDER — SODIUM CHLORIDE 0.9 % IV SOLN: 500.0000 mL | Freq: Once | INTRAVENOUS | Status: DC

## 2019-01-24 NOTE — Op Note (Signed)
[facility] Patient Name: [name] Procedure Date: 01/24/2019 8:35 AM MRN: [id number] Endoscopist: Ladene Artist , MD Age: 73 Referring MD:  Date of Birth: 1946-09-12 Gender: Male Account #: [id number] Procedure:                Colonoscopy Indications:              Surveillance: Personal history of adenomatous                            polyps on last colonoscopy 5 years ago Medicines:                Monitored Anesthesia Care Procedure:                Pre-Anesthesia Assessment:                           - Prior to the procedure, a History and Physical                            was performed, and patient medications and                            allergies were reviewed. The patient's tolerance of                            previous anesthesia was also reviewed. The risks                            and benefits of the procedure and the sedation                            options and risks were discussed with the patient.                            All questions were answered, and informed consent                            was obtained. Prior Anticoagulants: The patient has                            taken no previous anticoagulant or antiplatelet                            agents. ASA Grade Assessment: III - A patient with                            severe systemic disease. After reviewing the risks                            and benefits, the patient was deemed in                            satisfactory condition to undergo the procedure.  After obtaining informed consent, the colonoscope                            was passed under direct vision. Throughout the                            procedure, the patient's blood pressure, pulse, and                            oxygen saturations were monitored continuously. The                            Colonoscope was introduced through the anus and                            advanced to the the cecum,  identified by                            appendiceal orifice and ileocecal valve. The                            ileocecal valve, appendiceal orifice, and rectum                            were photographed. The quality of the bowel                            preparation was good. The colonoscopy was performed                            without difficulty. The patient tolerated the                            procedure well. Scope In: 8:38:01 AM Scope Out: 8:54:20 AM Scope Withdrawal Time: 0 hours 12 minutes 38 seconds  Total Procedure Duration: 0 hours 16 minutes 19 seconds  Findings:                 The perianal and digital rectal examinations were                            normal.                           Five sessile polyps were found in the transverse                            colon and cecum. The polyps were 6 to 8 mm in size.                            These polyps were removed with a cold snare.                            Resection and retrieval were complete.  The exam was otherwise without abnormality on                            direct and retroflexion views. Complications:            No immediate complications. Estimated blood loss:                            None. Estimated Blood Loss:     Estimated blood loss: none. Impression:               - Five 6 to 8 mm polyps in the transverse colon and                            in the cecum, removed with a cold snare. Resected                            and retrieved.                           - Otherwise normal appearing colonoscopy. Recommendation:           - Repeat colonoscopy in 3 - 5 years for                            surveillance pending pathology review.                           - Patient has a contact number available for                            emergencies. The signs and symptoms of potential                            delayed complications were discussed with the                             patient. Return to normal activities tomorrow.                            Written discharge instructions were provided to the                            patient.                           - Resume previous diet.                           - Continue present medications.                           - Await pathology results. Ladene Artist, MD 01/24/2019 8:57:52 AM This report has been signed electronically.

## 2019-01-24 NOTE — Progress Notes (Signed)
Called to room to assist during endoscopic procedure.  Patient ID and intended procedure confirmed with present staff. Received instructions for my participation in the procedure from the performing physician.  

## 2019-01-24 NOTE — Progress Notes (Signed)
Report to PACU, RN, vss, BBS= Clear.  

## 2019-01-24 NOTE — Patient Instructions (Signed)
  Information on polyps given to you today  Await pathology results on polyps removed     YOU HAD AN ENDOSCOPIC PROCEDURE TODAY AT [facility]:   Refer to the procedure report that was given to you for any specific questions about what was found during the examination.  If the procedure report does not answer your questions, please call your gastroenterologist to clarify.  If you requested that your care partner not be given the details of your procedure findings, then the procedure report has been included in a sealed envelope for you to review at your convenience later.  YOU SHOULD EXPECT: Some feelings of bloating in the abdomen. Passage of more gas than usual.  Walking can help get rid of the air that was put into your GI tract during the procedure and reduce the bloating. If you had a lower endoscopy (such as a colonoscopy or flexible sigmoidoscopy) you may notice spotting of blood in your stool or on the toilet paper. If you underwent a bowel prep for your procedure, you may not have a normal bowel movement for a few days.  Please Note:  You might notice some irritation and congestion in your nose or some drainage.  This is from the oxygen used during your procedure.  There is no need for concern and it should clear up in a day or so.  SYMPTOMS TO REPORT IMMEDIATELY:   Following lower endoscopy (colonoscopy or flexible sigmoidoscopy):  Excessive amounts of blood in the stool  Significant tenderness or worsening of abdominal pains  Swelling of the abdomen that is new, acute  Fever of 100F or higher    For urgent or emergent issues, a gastroenterologist can be reached at any hour by calling [phone].   DIET:  We do recommend a small meal at first, but then you may proceed to your regular diet.  Drink plenty of fluids but you should avoid alcoholic beverages for 24 hours.  ACTIVITY:  You should plan to take it easy for the rest of today and you should NOT DRIVE  or use heavy machinery until tomorrow (because of the sedation medicines used during the test).    FOLLOW UP: Our staff will call the number listed on your records the next business day following your procedure to check on you and address any questions or concerns that you may have regarding the information given to you following your procedure. If we do not reach you, we will leave a message.  However, if you are feeling well and you are not experiencing any problems, there is no need to return our call.  We will assume that you have returned to your regular daily activities without incident.  If any biopsies were taken you will be contacted by phone or by letter within the next 1-3 weeks.  Please call us at [phone] if you have not heard about the biopsies in 3 weeks.    SIGNATURES/CONFIDENTIALITY: You and/or your care partner have signed paperwork which will be entered into your electronic medical record.  These signatures attest to the fact that that the information above on your After Visit Summary has been reviewed and is understood.  Full responsibility of the confidentiality of this discharge information lies with you and/or your care-partner.

## 2019-01-25 ENCOUNTER — Telehealth: Payer: Self-pay | Admitting: *Deleted

## 2019-01-25 NOTE — Telephone Encounter (Signed)
  Follow up Call-  Call back number 01/24/2019  Post procedure Call Back phone  # [phone]  Permission to leave phone message Yes  Some recent data might be hidden     Patient questions:  Do you have a fever, pain , or abdominal swelling? No. Pain Score  0 *  Have you tolerated food without any problems? Yes.    Have you been able to return to your normal activities? Yes.    Do you have any questions about your discharge instructions: Diet   No. Medications  No. Follow up visit  No.  Do you have questions or concerns about your Care? No.  Actions: * If pain score is 4 or above: No action needed, pain <4.

## 2019-01-27 ENCOUNTER — Encounter: Payer: Self-pay | Admitting: Gastroenterology

## 2019-02-03 DIAGNOSIS — H16002 Unspecified corneal ulcer, left eye: Secondary | ICD-10-CM | POA: Diagnosis not present

## 2019-02-04 ENCOUNTER — Other Ambulatory Visit: Payer: Self-pay | Admitting: Endocrinology

## 2019-02-07 DIAGNOSIS — H16002 Unspecified corneal ulcer, left eye: Secondary | ICD-10-CM | POA: Diagnosis not present

## 2019-02-09 DIAGNOSIS — H16002 Unspecified corneal ulcer, left eye: Secondary | ICD-10-CM | POA: Diagnosis not present

## 2019-02-10 ENCOUNTER — Ambulatory Visit (INDEPENDENT_AMBULATORY_CARE_PROVIDER_SITE_OTHER): Payer: PPO

## 2019-02-10 ENCOUNTER — Other Ambulatory Visit: Payer: Self-pay | Admitting: Endocrinology

## 2019-02-10 ENCOUNTER — Other Ambulatory Visit: Payer: Self-pay

## 2019-02-10 DIAGNOSIS — E538 Deficiency of other specified B group vitamins: Secondary | ICD-10-CM

## 2019-02-10 MED ORDER — CYANOCOBALAMIN 1000 MCG/ML IJ SOLN
1000.0000 ug | Freq: Once | INTRAMUSCULAR | Status: AC
  Administered 2019-02-10: 1000 ug via INTRAMUSCULAR

## 2019-02-10 NOTE — Progress Notes (Signed)
[name] presents today for injection per MD orders. B12 injection administered IM in left Upper Arm. Administration without incident. Patient tolerated well.  Kadynce Bonds,cma

## 2019-02-24 ENCOUNTER — Telehealth: Payer: Self-pay | Admitting: Internal Medicine

## 2019-02-24 MED ORDER — SIMVASTATIN 40 MG PO TABS: 40.0000 mg | ORAL_TABLET | Freq: Every day | ORAL | 3 refills | Status: DC

## 2019-02-24 MED ORDER — OMEPRAZOLE 40 MG PO CPDR: 40.0000 mg | DELAYED_RELEASE_CAPSULE | Freq: Every day | ORAL | 1 refills | Status: DC

## 2019-02-24 NOTE — Telephone Encounter (Signed)
Medication has been refilled.

## 2019-02-24 NOTE — Telephone Encounter (Signed)
Copied from [place] [phone]. Topic: Quick Communication - Rx Refill/Question >> Feb 24, 2019  9:58 AM Sheppard Coil, [employer] wrote: Medication:  simvastatin (ZOCOR) 40 MG tablet omeprazole (PRILOSEC) 40 MG capsule  Has the patient contacted their pharmacy? Yes - this will be the first time PCP will refill (Agent: If no, request that the patient contact the pharmacy for the refill.) (Agent: If yes, when and what did the pharmacy advise?)  Preferred Pharmacy (with phone number or street name): [id number] - [place], [address] [phone] (Phone) [phone] (Fax)  Agent: Please be advised that RX refills may take up to 3 business days. We ask that you follow-up with your pharmacy.

## 2019-03-30 ENCOUNTER — Ambulatory Visit (INDEPENDENT_AMBULATORY_CARE_PROVIDER_SITE_OTHER): Payer: PPO | Admitting: Internal Medicine

## 2019-03-30 DIAGNOSIS — Z794 Long term (current) use of insulin: Secondary | ICD-10-CM

## 2019-03-30 DIAGNOSIS — I2510 Atherosclerotic heart disease of native coronary artery without angina pectoris: Secondary | ICD-10-CM

## 2019-03-30 DIAGNOSIS — E538 Deficiency of other specified B group vitamins: Secondary | ICD-10-CM

## 2019-03-30 DIAGNOSIS — Z125 Encounter for screening for malignant neoplasm of prostate: Secondary | ICD-10-CM

## 2019-03-30 DIAGNOSIS — I10 Essential (primary) hypertension: Secondary | ICD-10-CM

## 2019-03-30 DIAGNOSIS — E11319 Type 2 diabetes mellitus with unspecified diabetic retinopathy without macular edema: Secondary | ICD-10-CM

## 2019-03-30 DIAGNOSIS — E559 Vitamin D deficiency, unspecified: Secondary | ICD-10-CM

## 2019-03-30 MED ORDER — METOPROLOL SUCCINATE ER 25 MG PO TB24: 25.0000 mg | ORAL_TABLET | Freq: Every day | ORAL | 3 refills | Status: DC

## 2019-03-30 MED ORDER — CYANOCOBALAMIN 1000 MCG/ML IJ SOLN: 1000.0000 ug | INTRAMUSCULAR | 12 refills | Status: DC

## 2019-03-30 MED ORDER — "NEEDLE (DISP) 25G X 1"" MISC": 4.0000 | 12 refills | Status: DC

## 2019-03-30 NOTE — Progress Notes (Signed)
telephone Note  I connected with [name]  on 03/30/19 at  2:07 PM EDT by telephone and verified that I am speaking with the correct person using two identifiers.  Location patient: home Location provider:work  Persons participating in the virtual visit: patient, provider  I discussed the limitations of evaluation and management by telemedicine and the availability of in person appointments. The patient expressed understanding and agreed to proceed.   HPI: 1. B12 def last dose 02/10/2019 will send Rx B12 to do at home  2. DM 2 A1C 7.6 12/02/2018 upcoming appt Dr. Loanne Drilling will see if he can Rx freestyle Elenor Legato he is doing 70/30 150 in am and 50-60 in pm and having low 1x of <70 cbgs in am fasting 112 no issues  3. HTN BP 110/51 on toprol xl 50 mg qd hyzaar 100-25 mg qd  4. Sob with exertion per pt has h/o COPD no CxR in >3 years echo 07/2018 normal EF mild valve regurg. He denies Albuterol inhaler for now    ROS: See pertinent positives and negatives per HPI.  Past Medical History:  Diagnosis Date  . Adenomatous colon polyp 06/2001  . ALLERGIC RHINITIS 08/03/2007  . Allergy   . Asthma   . BENIGN PROSTATIC HYPERTROPHY 08/03/2007  . Chronic back pain   . COPD (chronic obstructive pulmonary disease) ([place])   . 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 Diabetes mellitus without complication ([place])    x 68-03 years as of 11/08/18   . DIABETIC RETINOPATHY, BACKGROUND 09/29/2008  . DYSPHAGIA UNSPECIFIED 09/29/2008  . 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 GERD 11/28/2008  . HYPERLIPIDEMIA 08/03/2007  . Kidney stones    Dr. Boneta Lucks  . 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 PAIN IN SOFT TISSUES OF LIMB 01/07/2008  . UNSPECIFIED ANEMIA 12/29/2007  . Unspecified essential hypertension 01/07/2008  . URINARY CALCULUS 09/29/2008  . Urine incontinence     Past Surgical History:  Procedure Laterality Date  . CHOLECYSTECTOMY N/A 11/12/2016   Procedure: LAPAROSCOPIC CHOLECYSTECTOMY WITH  INTRAOPERATIVE CHOLANGIOGRAM;  Surgeon: Clayburn Pert, MD;  Location: [facility] ORS;  Service: General;  Laterality: N/A;  . COLONOSCOPY     polyp  . HAND SURGERY Right    2016  . LITHOTRIPSY    . SHOULDER SURGERY Left    left, cyst and tumor removed  . URINARY SURGERY  2018   Dr Daiva Eves "stretched" urethra around prostate to increase urine flow    Family History  Problem Relation Age of Onset  . Diabetes Mother   . Depression Mother   . Prostate cancer Brother   . Diabetes Brother   . Heart disease Brother   . Diabetes Brother   . Colon cancer Neg Hx   . Esophageal cancer Neg Hx   . Rectal cancer Neg Hx   . Stomach cancer Neg Hx     SOCIAL HX: married    Current Outpatient Medications:  .  aspirin 81 MG tablet, Take 81 mg by mouth daily., Disp: , Rfl:  .  Dutasteride-Tamsulosin HCl 0.5-0.4 MG CAPS, Take 0.4 mg by mouth daily., Disp: , Rfl:  .  hydrochlorothiazide (HYDRODIURIL) 12.5 MG tablet, Take 1 tablet (12.5 mg total) by mouth daily. In am inform pt combination not available, Disp: 90 tablet, Rfl: 3 .  Insulin Pen Needle 31G X 5 MM MISC, Use as directed 2 X daily. BD ULTRAPFINE MINI. Dx: 250.00, Disp: 100 each, Rfl: 5 .  Lancets (ONETOUCH ULTRASOFT) lancets, Use  as instructed 2x daily Dx: 250.00, Disp: 100 each, Rfl: 5 .  losartan (COZAAR) 100 MG tablet, Take 1 tablet (100 mg total) by mouth daily. In am inform pt combination not available, Disp: 90 tablet, Rfl: 3 .  losartan-hydrochlorothiazide (HYZAAR) 100-12.5 MG tablet, Take 1 tablet by mouth daily., Disp: 90 tablet, Rfl: 3 .  MAGNESIUM PO, Take 1 tablet by mouth daily. , Disp: , Rfl:  .  metoprolol succinate (TOPROL-XL) 25 MG 24 hr tablet, Take 1 tablet (25 mg total) by mouth daily., Disp: 90 tablet, Rfl: 3 .  NON FORMULARY, Prostate Peanut from [place]., Disp: , Rfl:  .  NOVOLIN 70/30 RELION (70-30) 100 UNIT/ML injection, INJECT 150 UNITS SUBCUTANEOUSLY WITH BREAKFAST AND 60 UNITS WITH SUPPER., Disp: 80  mL, Rfl: 0 .  omeprazole (PRILOSEC) 40 MG capsule, Take 1 capsule (40 mg total) by mouth daily., Disp: 90 capsule, Rfl: 1 .  ONE TOUCH ULTRA TEST test strip, TEST TWICE DAILY, Disp: 100 each, Rfl: 9 .  simvastatin (ZOCOR) 40 MG tablet, Take 1 tablet (40 mg total) by mouth at bedtime., Disp: 90 tablet, Rfl: 3 .  tamsulosin (FLOMAX) 0.4 MG CAPS capsule, Take 1 capsule by mouth daily., Disp: , Rfl:  .  traZODone (DESYREL) 50 MG tablet, Take 1 tablet (50 mg total) by mouth at bedtime., Disp: 90 tablet, Rfl: 2 .  vitamin E 400 UNIT capsule, Take 400 Units by mouth daily., Disp: , Rfl:  .  cyanocobalamin (,VITAMIN B-12,) 1000 MCG/ML injection, Inject 1 mL (1,000 mcg total) into the muscle every 30 (thirty) days., Disp: 4 mL, Rfl: 12 .  NEEDLE, DISP, 25 G (B-D DISP NEEDLE 25GX1") 25G X 1" MISC, 4 Devices by Does not apply route every 30 (thirty) days., Disp: 10 each, Rfl: 12  EXAM:  VITALS per patient if applicable:  GENERAL: alert, oriented, appears well and in no acute distress  HEENT: atraumatic, conjunttiva clear, no obvious abnormalities on inspection of external nose and ears  NECK: normal movements of the head and neck  LUNGS: on inspection no signs of respiratory distress, breathing rate appears normal, no obvious gross SOB, gasping or wheezing  CV: no obvious cyanosis  MS: moves all visible extremities without noticeable abnormality  PSYCH/NEURO: pleasant and cooperative, no obvious depression or anxiety, speech and thought processing grossly intact  ASSESSMENT AND PLAN:  Discussed the following assessment and plan:  B12 deficiency - Plan: cyanocobalamin (,VITAMIN B-12,) 1000 MCG/ML injection, NEEDLE, DISP, 25 G (B-D DISP NEEDLE 25GX1") 25G X 1" MISC to do at home monthly  Coronary artery disease involving native coronary artery of native heart without angina pectoris - Plan: metoprolol succinate (TOPROL-XL) 25 MG 24 hr tablet  Essential hypertension - Plan: metoprolol succinate  (TOPROL-XL) 25 MG 24 hr tablet reduce dose from 50 mg qd cont hyzaar 100-25   Type 2 diabetes mellitus with retinopathy, with long-term current use of insulin, macular edema presence unspecified, unspecified laterality, unspecified retinopathy severity ([place]) -appt 04/04/2019 endocrine will see if they can approve freestyle libre]  Sob with exertion  Had echo 07/2018, rec CXR and CT chest and pfts to consider in future  Prn albuterol pt declines for now   HM Flu shot utd  Tdap utd 08/31/18  prevnar and pna 23 utd  Consider shingrix vaccinein future PSA-pt due to f/u with Dr. Rogers Blocker no f/u sch'ed been >1 year as of 03/30/2019   Hep C negative 07/09/16, check hep A/B with fatty liverrec hep B vaccine  Never smoker except for cigars  GI Dr. Stark3/12/2018 5 polyps tubular repeat in 3 years  Dermatology established with [facility] last seen 12/02/17 Dr. Raliegh Ip cards appt sch again 07/25/2019    I discussed the assessment and treatment plan with the patient. The patient was provided an opportunity to ask questions and all were answered. The patient agreed with the plan and demonstrated an understanding of the instructions.   The patient was advised to call back or seek an in-person evaluation if the symptoms worsen or if the condition fails to improve as anticipated.  Time spent 25 minutes Delorise Jackson, MD

## 2019-03-30 NOTE — Patient Instructions (Signed)
Vitamin B12 Deficiency Vitamin B12 deficiency occurs when the body does not have enough vitamin B12. Vitamin B12 is an important vitamin. The body needs vitamin B12:  To make red blood cells.  To make DNA. This is the genetic material inside cells.  To help the nerves work properly so they can carry messages from the brain to the body. Vitamin B12 deficiency can cause various health problems, such as a low red blood cell count (anemia) or nerve damage. What are the causes? This condition may be caused by:  Not eating enough foods that contain vitamin B12.  Not having enough stomach acid and digestive fluids to properly absorb vitamin B12 from the food that you eat.  Certain digestive system diseases that make it hard to absorb vitamin B12. These diseases include Crohn disease, chronic pancreatitis, and cystic fibrosis.  Pernicious anemia. This is a condition in which the body does not make enough of a protein (intrinsic factor), resulting in too few red blood cells.  Having a surgery in which part of the stomach or small intestine is removed.  Taking certain medicines that make it hard for the body to absorb vitamin B12. These medicines include: ? Heartburn medicine (antacids and proton pump inhibitors). ? An antibiotic medicine called neomycin. ? Some medicines that are used to treat diabetes, tuberculosis, gout, or high cholesterol. What increases the risk? The following factors may make you more likely to develop a B12 deficiency:  Being older than age 69.  Eating a vegetarian or vegan diet, especially while you are pregnant.  Eating a poor diet while you are pregnant.  Taking certain drugs.  Having alcoholism. What are the signs or symptoms? In some cases, there are no symptoms of this condition. If the condition leads to anemia or nerve damage, various symptoms can occur, such as:  Weakness.  Fatigue.  Loss of appetite.  Weight loss.  Numbness or tingling in your  hands and feet.  Redness and burning of the tongue.  Confusion or memory problems.  Depression.  Sensory problems, such as color blindness, ringing in the ears, or loss of taste.  Diarrhea or constipation.  Trouble walking. If anemia is severe, symptoms can include:  Shortness of breath.  Dizziness.  Rapid heart rate (tachycardia).  How is this diagnosed? This condition may be diagnosed with a blood test to measure the level of vitamin B12 in your blood. You may have other tests to help find the cause of your vitamin B12 deficiency. These tests may include:  A complete blood count (CBC). This is a group of tests that measure certain characteristics of blood cells.  A blood test to measure intrinsic factor.  An endoscopy. In this procedure, a thin tube with a camera on the end is used to look into your stomach or intestines. How is this treated? Treatment for this condition depends on the cause. Common treatment options include:  Changing your eating and drinking habits, such as: ? Eating more foods that contain vitamin B12. ? Drinking less alcohol or no alcohol.  Taking vitamin B12 supplements. Your health care provider will tell you which dosage is best for you.  Getting vitamin B12 injections. Follow these instructions at home:  Take supplements only as told by your health care provider. Follow the directions carefully.  Get any injections that are prescribed by your health care provider.  Do not miss your appointments.  Eat lots of healthy foods that contain vitamin B12. Ask your health care provider if  Follow these instructions at home:   Take supplements only as told by your health care provider. Follow the directions carefully.   Get any injections that are prescribed by your health care provider.  Do not miss your appointments.   Eat lots of healthy foods that contain vitamin B12. Ask your health care provider if you should work with a dietitian. Foods that contain vitamin B12 include:  ? Meat.  ? Meat from birds (poultry).  ? Fish.  ? Eggs.  ? Cereal and dairy products that are fortified. This means that vitamin B12 has been added to the food. Check the label on the package to see if the food is fortified.   Do not abuse alcohol.   Keep all follow-up visits as told by your  health care provider. This is important.  Contact a health care provider if:   Your symptoms come back.  Get help right away if:   You develop shortness of breath.   You have chest pain.   You become dizzy or you lose consciousness.  This information is not intended to replace advice given to you by your health care provider. Make sure you discuss any questions you have with your health care provider.  Document Released: 02/02/2012 Document Revised: 04/23/2016 Document Reviewed: 03/28/2015  Elsevier Interactive Patient Education  2019 Elsevier Inc.

## 2019-04-01 ENCOUNTER — Telehealth: Payer: Self-pay | Admitting: Internal Medicine

## 2019-04-01 NOTE — Telephone Encounter (Signed)
Pt states the B12 needle is too large to inject himself and wanted to know if he can come into the ofc for someone to give? Also pt wanted to know if he can use his insulin needle?  Call pt @ [phone]. Thank you!

## 2019-04-01 NOTE — Telephone Encounter (Signed)
Patient has been informed.

## 2019-04-01 NOTE — Telephone Encounter (Signed)
Just set monthly B12 injections here wear a mask please   [place]

## 2019-04-04 ENCOUNTER — Ambulatory Visit (INDEPENDENT_AMBULATORY_CARE_PROVIDER_SITE_OTHER): Payer: PPO | Admitting: Endocrinology

## 2019-04-04 ENCOUNTER — Encounter: Payer: Self-pay | Admitting: Endocrinology

## 2019-04-04 ENCOUNTER — Other Ambulatory Visit: Payer: Self-pay

## 2019-04-04 DIAGNOSIS — E11649 Type 2 diabetes mellitus with hypoglycemia without coma: Secondary | ICD-10-CM | POA: Diagnosis not present

## 2019-04-04 DIAGNOSIS — Z794 Long term (current) use of insulin: Secondary | ICD-10-CM

## 2019-04-04 DIAGNOSIS — E11319 Type 2 diabetes mellitus with unspecified diabetic retinopathy without macular edema: Secondary | ICD-10-CM

## 2019-04-04 NOTE — Progress Notes (Addendum)
Subjective:    Patient ID: [name], male    DOB: 04-16-1946, 73 y.o.   MRN: [id number]  HPI  telehealth visit today via phone x 6 minutes Alternatives to telehealth are presented to this patient, and the patient agrees to the telehealth visit. Pt is advised of the cost of the visit, and agrees to this, also.   Patient is at home, and I am at the office.   Persons attending the telehealth visit: the patient and I Pt returns for f/u of diabetes mellitus: DM type: Insulin-requiring type 2 Dx'ed: 9449 Complications: renal insuff and retinopathy.  Therapy: insulin since soon after dx.   DKA: never.  Severe hypoglycemia: never.   Pancreatitis: never.   Other: he is on a BID insulin schedule, due to noncompliance; he takes human insulin, due to cost.  He is retired.   Interval history: pt states cbg's vary from 66-200's, but most are in the 100's.  It is in general higher as the day goes on.  He seldom has hypoglycemia, and these episodes are mild.  pt states he feels well in general.   Past Medical History:  Diagnosis Date  . Adenomatous colon polyp 06/2001  . ALLERGIC RHINITIS 08/03/2007  . Allergy   . Asthma   . BENIGN PROSTATIC HYPERTROPHY 08/03/2007  . Chronic back pain   . COPD (chronic obstructive pulmonary disease) ([place])   . 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 Diabetes mellitus without complication ([place])    x 67-59 years as of 11/08/18   . DIABETIC RETINOPATHY, BACKGROUND 09/29/2008  . DYSPHAGIA UNSPECIFIED 09/29/2008  . 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 GERD 11/28/2008  . HYPERLIPIDEMIA 08/03/2007  . Kidney stones    Dr. Boneta Lucks  . 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 PAIN IN SOFT TISSUES OF LIMB 01/07/2008  . UNSPECIFIED ANEMIA 12/29/2007  . Unspecified essential hypertension 01/07/2008  . URINARY CALCULUS 09/29/2008  . Urine incontinence     Past Surgical History:  Procedure Laterality Date  . CHOLECYSTECTOMY N/A 11/12/2016   Procedure:  LAPAROSCOPIC CHOLECYSTECTOMY WITH INTRAOPERATIVE CHOLANGIOGRAM;  Surgeon: Clayburn Pert, MD;  Location: [facility] ORS;  Service: General;  Laterality: N/A;  . COLONOSCOPY     polyp  . HAND SURGERY Right    2016  . LITHOTRIPSY    . SHOULDER SURGERY Left    left, cyst and tumor removed  . URINARY SURGERY  2018   Dr Daiva Eves "stretched" urethra around prostate to increase urine flow    Social History   Socioeconomic History  . Marital status: Married    Spouse name: Not on file  . Number of children: 2  . Years of education: Not on file  . Highest education level: Not on file  Occupational History  . 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 Financial resource strain: Not on file  . Food insecurity:    Worry: Not on file    Inability: Not on file  . Transportation needs:    Medical: Not on file    Non-medical: Not on file  Tobacco Use  . Smoking status: Never Smoker  . Smokeless tobacco: Never Used  Substance and Sexual Activity  . Alcohol use: Yes    Comment: occasional beer  . Drug use: No  . Sexual activity: Not on file  Lifestyle  . Physical activity:    Days per week: Not on file    Minutes per session: Not  on file  . Stress: Not on file  Relationships  . Social connections:    Talks on phone: Not on file    Gets together: Not on file    Attends religious service: Not on file    Active member of club or organization: Not on file    Attends meetings of clubs or organizations: Not on file    Relationship status: Not on file  . Intimate partner violence:    Fear of current or ex partner: Not on file    Emotionally abused: Not on file    Physically abused: Not on file    Forced sexual activity: Not on file  Other Topics Concern  . 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 aspirin 81 MG tablet Take 81 mg by mouth daily.    . cyanocobalamin (,VITAMIN B-12,) 1000 MCG/ML injection Inject 1 mL (1,000 mcg total) into the muscle every 30 (thirty) days. 4 mL 12  . Dutasteride-Tamsulosin HCl 0.5-0.4 MG CAPS Take 0.4 mg by mouth daily.    . hydrochlorothiazide (HYDRODIURIL) 12.5 MG tablet Take 1 tablet (12.5 mg total) by mouth daily. In am inform pt combination not available 90 tablet 3  . Insulin Pen Needle 31G X 5 MM MISC Use as directed 2 X daily. BD ULTRAPFINE MINI. Dx: 250.00 100 each 5  . Lancets (ONETOUCH ULTRASOFT) lancets Use as instructed 2x daily Dx: 250.00 100 each 5  . losartan (COZAAR) 100 MG tablet Take 1 tablet (100 mg total) by mouth daily. In am inform pt combination not available 90 tablet 3  . losartan-hydrochlorothiazide (HYZAAR) 100-12.5 MG tablet Take 1 tablet by mouth daily. 90 tablet 3  . MAGNESIUM PO Take 1 tablet by mouth daily.     . metoprolol succinate (TOPROL-XL) 25 MG 24 hr tablet Take 1 tablet (25 mg total) by mouth daily. 90 tablet 3  . NEEDLE, DISP, 25 G (B-D DISP NEEDLE 25GX1") 25G X 1" MISC 4 Devices by Does not apply route every 30 (thirty) days. 10 each 12  . NON FORMULARY Prostate Peanut from [place].    Marland Kitchen NOVOLIN 70/30 RELION (70-30) 100 UNIT/ML injection INJECT 150 UNITS SUBCUTANEOUSLY WITH BREAKFAST AND 60 UNITS WITH SUPPER. 80 mL 0  . omeprazole (PRILOSEC) 40 MG capsule Take 1 capsule (40 mg total) by mouth daily. 90 capsule 1  . ONE TOUCH ULTRA TEST test strip TEST TWICE DAILY 100 each 9  . simvastatin (ZOCOR) 40 MG tablet Take 1 tablet (40 mg total) by mouth at bedtime. 90 tablet 3  . tamsulosin (FLOMAX) 0.4 MG CAPS capsule Take 1 capsule by mouth daily.    . traZODone (DESYREL) 50 MG tablet Take 1 tablet (50 mg total) by mouth at bedtime. 90 tablet 2  . vitamin E 400 UNIT capsule Take 400 Units by mouth daily.     No current facility-administered medications on  file prior to visit.     Allergies  Allergen Reactions  . Sulfamethoxazole-Trimethoprim Rash    Family History  Problem Relation Age of Onset  . Diabetes Mother   . Depression Mother   . Prostate cancer Brother   . Diabetes Brother   . Heart disease Brother   .  Diabetes Brother   . Colon cancer Neg Hx   . Esophageal cancer Neg Hx   . Rectal cancer Neg Hx   . Stomach cancer Neg Hx      Review of Systems Denies LOC    Objective:   Physical Exam    Lab Results  Component Value Date   CREATININE 1.30 12/02/2018   BUN 17 12/02/2018   NA 140 12/02/2018   K 4.3 12/02/2018   CL 106 12/02/2018   CO2 26 12/02/2018  ;    Assessment & Plan:  Insulin-requiring type 2 DM, with DR: uncertain glycemic control.  Pt will come in for a1c Renal insuff: in this setting, he needs most of his daily insulin in the morning.  Hypoglycemia: This limits aggressiveness of glycemic control.

## 2019-04-05 ENCOUNTER — Other Ambulatory Visit: Payer: Self-pay

## 2019-04-05 ENCOUNTER — Ambulatory Visit (INDEPENDENT_AMBULATORY_CARE_PROVIDER_SITE_OTHER): Payer: PPO

## 2019-04-05 DIAGNOSIS — E538 Deficiency of other specified B group vitamins: Secondary | ICD-10-CM

## 2019-04-05 MED ORDER — CYANOCOBALAMIN 1000 MCG/ML IJ SOLN
1000.0000 ug | Freq: Once | INTRAMUSCULAR | Status: AC
  Administered 2019-04-05: 1000 ug via INTRAMUSCULAR

## 2019-04-05 NOTE — Addendum Note (Signed)
Addended by: Renato Shin on: 04/05/2019 07:25 AM   Modules accepted: Level of Service

## 2019-04-05 NOTE — Progress Notes (Signed)
[name] presents today for injection per MD orders. B12 injection  administered IM in right Upper Arm. Administration without incident. Patient tolerated well.  Nina,cma

## 2019-04-06 ENCOUNTER — Ambulatory Visit (INDEPENDENT_AMBULATORY_CARE_PROVIDER_SITE_OTHER): Payer: PPO

## 2019-04-06 ENCOUNTER — Other Ambulatory Visit: Payer: Self-pay | Admitting: Endocrinology

## 2019-04-06 DIAGNOSIS — H16002 Unspecified corneal ulcer, left eye: Secondary | ICD-10-CM | POA: Diagnosis not present

## 2019-04-06 DIAGNOSIS — E11319 Type 2 diabetes mellitus with unspecified diabetic retinopathy without macular edema: Secondary | ICD-10-CM

## 2019-04-06 DIAGNOSIS — Z794 Long term (current) use of insulin: Secondary | ICD-10-CM | POA: Diagnosis not present

## 2019-04-06 LAB — POCT GLYCOSYLATED HEMOGLOBIN (HGB A1C): Hemoglobin A1C: 7.6 % — AB (ref 4.0–5.6)

## 2019-04-06 MED ORDER — INSULIN NPH ISOPHANE & REGULAR (70-30) 100 UNIT/ML ~~LOC~~ SUSP: SUBCUTANEOUS | 0 refills | Status: DC

## 2019-04-06 NOTE — Progress Notes (Signed)
Pt presents today for nurse visit for completion of A1C. 

## 2019-04-12 ENCOUNTER — Telehealth: Payer: Self-pay | Admitting: Endocrinology

## 2019-04-12 NOTE — Telephone Encounter (Signed)
please contact patient: In order to qualify for continuous glucose monitor, he would need to check cbg qid.  Does he want to do that?

## 2019-04-12 NOTE — Telephone Encounter (Signed)
Declined to check CBG's QID. Does not wish to proceed with attempts to qualify for CGM at this time.

## 2019-04-12 NOTE — Telephone Encounter (Signed)
-----   Message from Ocie Doyne, RN sent at 04/12/2019 11:44 AM EDT ----- Your note must state he is testing 4X/day to qualify

## 2019-05-12 ENCOUNTER — Other Ambulatory Visit: Payer: Self-pay

## 2019-05-12 ENCOUNTER — Ambulatory Visit (INDEPENDENT_AMBULATORY_CARE_PROVIDER_SITE_OTHER): Payer: PPO

## 2019-05-12 DIAGNOSIS — E538 Deficiency of other specified B group vitamins: Secondary | ICD-10-CM | POA: Diagnosis not present

## 2019-05-12 MED ORDER — CYANOCOBALAMIN 1000 MCG/ML IJ SOLN
1000.0000 ug | Freq: Once | INTRAMUSCULAR | Status: AC
  Administered 2019-05-12: 1000 ug via INTRAMUSCULAR

## 2019-05-12 NOTE — Progress Notes (Signed)
Patient presented today for B12 injection per MD order.  Administered IM in left deltoid without incident.  Patient tolerated well with no signs of distress.

## 2019-06-01 ENCOUNTER — Other Ambulatory Visit: Payer: Self-pay

## 2019-06-01 ENCOUNTER — Other Ambulatory Visit (INDEPENDENT_AMBULATORY_CARE_PROVIDER_SITE_OTHER): Payer: PPO

## 2019-06-01 DIAGNOSIS — Z794 Long term (current) use of insulin: Secondary | ICD-10-CM

## 2019-06-01 DIAGNOSIS — E538 Deficiency of other specified B group vitamins: Secondary | ICD-10-CM

## 2019-06-01 DIAGNOSIS — Z125 Encounter for screening for malignant neoplasm of prostate: Secondary | ICD-10-CM | POA: Diagnosis not present

## 2019-06-01 DIAGNOSIS — E559 Vitamin D deficiency, unspecified: Secondary | ICD-10-CM | POA: Diagnosis not present

## 2019-06-01 DIAGNOSIS — I10 Essential (primary) hypertension: Secondary | ICD-10-CM | POA: Diagnosis not present

## 2019-06-01 DIAGNOSIS — E11319 Type 2 diabetes mellitus with unspecified diabetic retinopathy without macular edema: Secondary | ICD-10-CM | POA: Diagnosis not present

## 2019-06-01 LAB — CBC WITH DIFFERENTIAL/PLATELET
Basophils Absolute: 0.1 10*3/uL (ref 0.0–0.1)
Basophils Relative: 1.1 % (ref 0.0–3.0)
Eosinophils Absolute: 0.4 10*3/uL (ref 0.0–0.7)
Eosinophils Relative: 7 % — ABNORMAL HIGH (ref 0.0–5.0)
HCT: 49.8 % (ref 39.0–52.0)
Hemoglobin: 17.3 g/dL — ABNORMAL HIGH (ref 13.0–17.0)
Lymphocytes Relative: 33.6 % (ref 12.0–46.0)
Lymphs Abs: 2.1 10*3/uL (ref 0.7–4.0)
MCHC: 34.6 g/dL (ref 30.0–36.0)
MCV: 96.2 fl (ref 78.0–100.0)
Monocytes Absolute: 0.7 10*3/uL (ref 0.1–1.0)
Monocytes Relative: 10.4 % (ref 3.0–12.0)
Neutro Abs: 3 10*3/uL (ref 1.4–7.7)
Neutrophils Relative %: 47.9 % (ref 43.0–77.0)
Platelets: 168 10*3/uL (ref 150.0–400.0)
RBC: 5.18 Mil/uL (ref 4.22–5.81)
RDW: 14 % (ref 11.5–15.5)
WBC: 6.3 10*3/uL (ref 4.0–10.5)

## 2019-06-01 LAB — LIPID PANEL
Cholesterol: 94 mg/dL (ref 0–200)
HDL: 35.1 mg/dL — ABNORMAL LOW (ref >39.00)
LDL Cholesterol: 30 mg/dL (ref 0–99)
NonHDL: 58.61
Total CHOL/HDL Ratio: 3
Triglycerides: 144 mg/dL (ref 0.0–149.0)
VLDL: 28.8 mg/dL (ref 0.0–40.0)

## 2019-06-01 LAB — COMPREHENSIVE METABOLIC PANEL WITH GFR
ALT: 19 U/L (ref 0–53)
AST: 18 U/L (ref 0–37)
Albumin: 4.3 g/dL (ref 3.5–5.2)
Alkaline Phosphatase: 50 U/L (ref 39–117)
BUN: 17 mg/dL (ref 6–23)
CO2: 28 meq/L (ref 19–32)
Calcium: 9.9 mg/dL (ref 8.4–10.5)
Chloride: 101 meq/L (ref 96–112)
Creatinine, Ser: 1.35 mg/dL (ref 0.40–1.50)
GFR: 52.09 mL/min — ABNORMAL LOW
Glucose, Bld: 114 mg/dL — ABNORMAL HIGH (ref 70–99)
Potassium: 4.3 meq/L (ref 3.5–5.1)
Sodium: 139 meq/L (ref 135–145)
Total Bilirubin: 0.8 mg/dL (ref 0.2–1.2)
Total Protein: 7.3 g/dL (ref 6.0–8.3)

## 2019-06-01 LAB — PSA, MEDICARE: PSA: 3.1 ng/ml (ref 0.10–4.00)

## 2019-06-01 LAB — HEMOGLOBIN A1C: Hgb A1c MFr Bld: 7.1 % — ABNORMAL HIGH (ref 4.6–6.5)

## 2019-06-01 LAB — VITAMIN D 25 HYDROXY (VIT D DEFICIENCY, FRACTURES): VITD: 51.09 ng/mL (ref 30.00–100.00)

## 2019-06-01 LAB — VITAMIN B12: Vitamin B-12: 398 pg/mL (ref 211–911)

## 2019-06-06 ENCOUNTER — Other Ambulatory Visit: Payer: Self-pay

## 2019-06-06 DIAGNOSIS — E11319 Type 2 diabetes mellitus with unspecified diabetic retinopathy without macular edema: Secondary | ICD-10-CM

## 2019-06-06 DIAGNOSIS — E113393 Type 2 diabetes mellitus with moderate nonproliferative diabetic retinopathy without macular edema, bilateral: Secondary | ICD-10-CM | POA: Diagnosis not present

## 2019-06-06 MED ORDER — NOVOLIN 70/30 RELION (70-30) 100 UNIT/ML ~~LOC~~ SUSP: SUBCUTANEOUS | 0 refills | Status: DC

## 2019-06-06 MED ORDER — "INSULIN SYRINGE-NEEDLE U-100 30G X 5/16"" 1 ML MISC": 1.0000 | Freq: Two times a day (BID) | 2 refills | Status: DC

## 2019-06-08 ENCOUNTER — Encounter: Payer: Self-pay | Admitting: Internal Medicine

## 2019-06-08 LAB — HM DIABETES EYE EXAM

## 2019-06-22 ENCOUNTER — Other Ambulatory Visit: Payer: Self-pay

## 2019-06-22 ENCOUNTER — Ambulatory Visit (INDEPENDENT_AMBULATORY_CARE_PROVIDER_SITE_OTHER): Payer: PPO

## 2019-06-22 DIAGNOSIS — E538 Deficiency of other specified B group vitamins: Secondary | ICD-10-CM

## 2019-06-22 MED ORDER — CYANOCOBALAMIN 1000 MCG/ML IJ SOLN
1000.0000 ug | Freq: Once | INTRAMUSCULAR | Status: AC
  Administered 2019-06-22: 1000 ug via INTRAMUSCULAR

## 2019-06-22 NOTE — Progress Notes (Signed)
Patient presented today for B12 injection.  Administered IM in right deltoid.  Patient tolerated well with no signs of distress.   

## 2019-06-27 ENCOUNTER — Ambulatory Visit (INDEPENDENT_AMBULATORY_CARE_PROVIDER_SITE_OTHER): Payer: PPO | Admitting: Family Medicine

## 2019-06-27 ENCOUNTER — Other Ambulatory Visit: Payer: Self-pay

## 2019-06-27 ENCOUNTER — Encounter: Payer: Self-pay | Admitting: Family Medicine

## 2019-06-27 VITALS — BP 142/64 | HR 74 | Temp 98.0°F | Resp 18 | Ht 72.0 in | Wt 226.8 lb

## 2019-06-27 DIAGNOSIS — E11319 Type 2 diabetes mellitus with unspecified diabetic retinopathy without macular edema: Secondary | ICD-10-CM

## 2019-06-27 DIAGNOSIS — S90411A Abrasion, right great toe, initial encounter: Secondary | ICD-10-CM | POA: Diagnosis not present

## 2019-06-27 DIAGNOSIS — Z794 Long term (current) use of insulin: Secondary | ICD-10-CM | POA: Diagnosis not present

## 2019-06-27 MED ORDER — MUPIROCIN 2 % EX OINT: 1.0000 "application " | TOPICAL_OINTMENT | Freq: Every day | CUTANEOUS | 0 refills | Status: DC

## 2019-06-27 MED ORDER — CEPHALEXIN 500 MG PO CAPS: 500.0000 mg | ORAL_CAPSULE | Freq: Two times a day (BID) | ORAL | 0 refills | Status: DC

## 2019-06-27 NOTE — Progress Notes (Signed)
Subjective:    Patient ID: [name], male    DOB: [birth year], 73 y.o.   MRN: [id number]  HPI   Patient presents to clinic due to a cut on right great toe.  Patient states he was outdoors yesterday and today working in his yard and believes he must have stepped on something to cut his toe.  Does not remember stepping on anything, but upon returning into the house noticed that his right great toe was bleeding and had a loose area of skin.  Patient was able to cut off a piece of a loose area of skin, cleaned the skin with soap and water and put a Band-Aid.  Due to being a diabetic, he was concerned that he might need more medication or antibiotics to avoid infection.  Tetanus vaccine is up-to-date, was given in 2019.  Patient Active Problem List   Diagnosis Date Noted  . Vitamin D deficiency 12/17/2018  . B12 deficiency 12/16/2018  . Left carotid stenosis 11/15/2018  . Fatty liver 11/15/2018  . Hyperlipidemia 11/15/2018  . History of kidney stones 11/08/2018  . CAD (coronary artery disease) 11/08/2018  . 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 Cholecystitis 11/11/2016  . Chest pain 09/26/2016  . Diplopia 08/21/2016  . Renal failure 08/21/2016  . Dyspnea on exertion 07/09/2016  . Diabetes ([place]) 03/29/2016  . Hematuria 08/07/2014  . Routine general medical examination at a health care facility 09/08/2013  . Hearing loss of aging 09/07/2013  . Screening for prostate cancer 05/04/2013  . Encounter for long-term (current) use of other medications 05/04/2013  . Contusion of ankle or foot, right 05/07/2012  . KNEE PAIN, RIGHT 12/20/2010  . PROTEINURIA, MILD 05/14/2010  . HIP PAIN, LEFT 11/13/2009  . 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 GERD 11/28/2008  . Chronic constipation 11/28/2008  . OTHER DYSPHAGIA 11/28/2008  . 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[place]) 09/29/2008  . URINARY CALCULUS 09/29/2008  . 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  DYSPHAGIA UNSPECIFIED 09/29/2008  . HTN (hypertension) 01/07/2008  . PAIN IN SOFT TISSUES OF LIMB 01/07/2008  . UNSPECIFIED ANEMIA 12/29/2007  . Dyslipidemia 08/03/2007  . ALLERGIC RHINITIS 08/03/2007  . BPH (benign prostatic hyperplasia) 08/03/2007  . COLONIC POLYPS, HX OF 08/03/2007   Social History   Tobacco Use  . Smoking status: Never Smoker  . Smokeless tobacco: Never Used  Substance Use Topics  . Alcohol use: Yes    Comment: occasional beer    Review of Systems   Constitutional: Negative for chills, fatigue and fever.  HENT: Negative for congestion, ear pain, sinus pain and sore throat.   Eyes: Negative.   Respiratory: Negative for cough, shortness of breath and wheezing.   Cardiovascular: Negative for chest pain, palpitations and leg swelling.  Gastrointestinal: Negative for abdominal pain, diarrhea, nausea and vomiting.  Genitourinary: Negative for dysuria, frequency and urgency.  Musculoskeletal: Negative for arthralgias and myalgias.  Skin: +cut on right big toe Neurological: Negative for syncope, light-headedness and headaches.  Psychiatric/Behavioral: The patient is not nervous/anxious.       Objective:   Physical Exam Vitals signs and nursing note reviewed.  Constitutional:      General: He is not in acute distress.    Appearance: He is not ill-appearing, toxic-appearing or diaphoretic.  Cardiovascular:     Rate and Rhythm: Normal rate and regular rhythm.  Pulmonary:     Effort: Pulmonary effort is normal. No respiratory distress.     Breath sounds: Normal breath  sounds.  Musculoskeletal:     Right lower leg: No edema.     Left lower leg: No edema.       Feet:     Comments: Able to wiggle toes on both feet.  Range of motion of ankles and feet intact.  Feet:     Comments: Surface tear of skin on base of right toe, location indicated by red mark on diagram.  I was able to remove loose skin with sterile scissors.  Skin cleansed with Betadine, normal  saline and alcohol prep.  Thin layer of triple antibiotic ointment applied and covered with Band-Aid. Skin:    General: Skin is warm and dry.     Capillary Refill: Capillary refill takes less than 2 seconds.     Coloration: Skin is not jaundiced or pale.  Neurological:     Mental Status: He is alert and oriented to person, place, and time.  Psychiatric:        Mood and Affect: Mood normal.        Behavior: Behavior normal.     Today's Vitals   06/27/19 1528  BP: (!) 142/64  Pulse: 74  Resp: 18  Temp: 98 F (36.7 C)  TempSrc: Oral  SpO2: 97%  Weight: 226 lb 12.8 oz (102.9 kg)  Height: 6' (1.829 m)   Body mass index is 30.76 kg/m.    Assessment & Plan:    Abrasion right great toe, type 2 diabetes - due to patient being a type II diabetic we will cover him with Keflex twice daily for 10 days and he will also use mupirocin ointment daily after cleansing skin with soap and water prior to applying Band-Aid.  Diligently for any worsening of redness, development of drainage, increased pain,, chills, streaking redness of skin -- if any of these symptoms occur to call office right away.   Patient will follow-up in clinic in 1 week for recheck on wound.  He also has family members who can help him monitor and dress the wound.

## 2019-06-27 NOTE — Patient Instructions (Signed)
Wash skin daily with soap and water. Pat dry. Apply thin layer of ointment and cover with bandaid

## 2019-07-07 ENCOUNTER — Encounter: Payer: Self-pay | Admitting: Family Medicine

## 2019-07-07 ENCOUNTER — Other Ambulatory Visit: Payer: Self-pay

## 2019-07-07 ENCOUNTER — Ambulatory Visit (INDEPENDENT_AMBULATORY_CARE_PROVIDER_SITE_OTHER): Payer: PPO | Admitting: Family Medicine

## 2019-07-07 VITALS — BP 138/76 | HR 71 | Temp 98.5°F | Resp 18 | Ht 72.0 in | Wt 225.2 lb

## 2019-07-07 DIAGNOSIS — S90421A Blister (nonthermal), right great toe, initial encounter: Secondary | ICD-10-CM | POA: Diagnosis not present

## 2019-07-07 DIAGNOSIS — S90411A Abrasion, right great toe, initial encounter: Secondary | ICD-10-CM | POA: Diagnosis not present

## 2019-07-07 NOTE — Patient Instructions (Signed)
Keep skin clean and dry  Apply thin layer of ointment daily and cover with bandaid  Leave blister alone, if it pops on own that is OK, just clean/use ointment and cover with bandaid. If it does not pop on own, body will re-absorb

## 2019-07-07 NOTE — Progress Notes (Signed)
Subjective:    Patient ID: [name], male    DOB: [birth year], 73 y.o.   MRN: [id number]  HPI   Presents to clinic for follow up on abrasion of right great toe and healing. He was treated with keflex course and topical mupirocin. He is a diabetic so we wanted to be hypervigilant with treatment and recheck to ensure healing.   Area on bottom of right great toe has almost completely dried out.   Did get blister on side of right great toe after mowing the lawn.    Patient Active Problem List   Diagnosis Date Noted  . Vitamin D deficiency 12/17/2018  . B12 deficiency 12/16/2018  . Left carotid stenosis 11/15/2018  . Fatty liver 11/15/2018  . Hyperlipidemia 11/15/2018  . History of kidney stones 11/08/2018  . CAD (coronary artery disease) 11/08/2018  . 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 Cholecystitis 11/11/2016  . Chest pain 09/26/2016  . Diplopia 08/21/2016  . Renal failure 08/21/2016  . Dyspnea on exertion 07/09/2016  . Diabetes ([place]) 03/29/2016  . Hematuria 08/07/2014  . Routine general medical examination at a health care facility 09/08/2013  . Hearing loss of aging 09/07/2013  . Screening for prostate cancer 05/04/2013  . Encounter for long-term (current) use of other medications 05/04/2013  . Contusion of ankle or foot, right 05/07/2012  . KNEE PAIN, RIGHT 12/20/2010  . PROTEINURIA, MILD 05/14/2010  . HIP PAIN, LEFT 11/13/2009  . 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 GERD 11/28/2008  . Chronic constipation 11/28/2008  . OTHER DYSPHAGIA 11/28/2008  . 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[place]) 09/29/2008  . URINARY CALCULUS 09/29/2008  . 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 DYSPHAGIA UNSPECIFIED 09/29/2008  . HTN (hypertension) 01/07/2008  . PAIN IN SOFT TISSUES OF LIMB 01/07/2008  . UNSPECIFIED ANEMIA 12/29/2007  . Dyslipidemia 08/03/2007  . ALLERGIC RHINITIS 08/03/2007  . BPH (benign prostatic hyperplasia) 08/03/2007   . COLONIC POLYPS, HX OF 08/03/2007   Social History   Tobacco Use  . Smoking status: Never Smoker  . Smokeless tobacco: Never Used  Substance Use Topics  . Alcohol use: Yes    Comment: occasional beer   Review of Systems  Constitutional: Negative for chills, fatigue and fever.  HENT: Negative for congestion, ear pain, sinus pain and sore throat.   Eyes: Negative.   Respiratory: Negative for cough, shortness of breath and wheezing.   Cardiovascular: Negative for chest pain, palpitations and leg swelling.  Gastrointestinal: Negative for abdominal pain, diarrhea, nausea and vomiting.  Genitourinary: Negative for dysuria, frequency and urgency.  Musculoskeletal: Negative for arthralgias and myalgias.  Skin: Abrasion & blister right great toe Neurological: Negative for syncope, light-headedness and headaches.  Psychiatric/Behavioral: The patient is not nervous/anxious.       Objective:   Physical Exam Vitals signs and nursing note reviewed.  Constitutional:      General: He is not in acute distress.    Appearance: He is not ill-appearing, toxic-appearing or diaphoretic.  HENT:     Head: Normocephalic and atraumatic.  Cardiovascular:     Rate and Rhythm: Normal rate and regular rhythm.     Comments: +DP pulses bilat Pulmonary:     Effort: Pulmonary effort is normal. No respiratory distress.     Breath sounds: Normal breath sounds.  Musculoskeletal:       Feet:  Feet:     Comments: Abrasion on bottom of right great toe improved in size and mainly is dried up.  Only small open area in center of abrasion still present.  No surrounding redness or abnormal drainage.  Abrasion area represented by red mark on diagram.  Blister approximately size of a blueberry present on side of right great toe.  Blister is not open or draining.  Blister area is represented by blue mark on diagram. Neurological:     Mental Status: He is alert and oriented to person, place, and time.     Gait:  Gait normal.  Psychiatric:        Mood and Affect: Mood normal.        Behavior: Behavior normal.        Thought Content: Thought content normal.     Vitals:   07/07/19 0809  BP: 138/76  Pulse: 71  Resp: 18  Temp: 98.5 F (36.9 C)  SpO2: 91%       Assessment & Plan:    Abrasion of right great toe-patient will continue treatments as already prescribed.  He will continue to wash feet daily with soap and water, pat dry and apply thin layer.  And ointment cover with Band-Aid.  He will finish remaining days of Keflex course.  Blister of right great toe-patient advised to leave blister area alone and allow the body to reabsorb.  Advised that if blister area does pop on its own that is okay, to keep skin clean and dry and cover with Band-Aid.  I did offer multiple times to refer patient to podiatrist for continued foot care and due to him being a diabetic.  He declines at this time but states he will reconsider.  He will be sure to check his feet every day for continued healing and even after these 2 foot issues have resolved he will continue to diligently check feet daily.  States he will reconsider seeing podiatrist in the future.  He will follow-up in approximately 7 to 10 days for recheck of feet.

## 2019-07-11 ENCOUNTER — Ambulatory Visit: Payer: PPO | Admitting: Endocrinology

## 2019-07-11 ENCOUNTER — Other Ambulatory Visit: Payer: Self-pay

## 2019-07-11 ENCOUNTER — Encounter: Payer: Self-pay | Admitting: Endocrinology

## 2019-07-11 VITALS — BP 144/70 | HR 64 | Ht 72.0 in | Wt 222.2 lb

## 2019-07-11 DIAGNOSIS — E11319 Type 2 diabetes mellitus with unspecified diabetic retinopathy without macular edema: Secondary | ICD-10-CM

## 2019-07-11 DIAGNOSIS — Z794 Long term (current) use of insulin: Secondary | ICD-10-CM | POA: Diagnosis not present

## 2019-07-11 LAB — POCT GLYCOSYLATED HEMOGLOBIN (HGB A1C): Hemoglobin A1C: 6.9 % — AB (ref 4.0–5.6)

## 2019-07-11 MED ORDER — NOVOLIN 70/30 RELION (70-30) 100 UNIT/ML ~~LOC~~ SUSP: SUBCUTANEOUS | 0 refills | Status: DC

## 2019-07-11 NOTE — Progress Notes (Signed)
Subjective:    Patient ID: [name], male    DOB: May 14, 1946, 73 y.o.   MRN: [id number]  HPI Pt returns for f/u of diabetes mellitus: DM type: Insulin-requiring type 2 Dx'ed: 4854 Complications: renal insuff and retinopathy.  Therapy: insulin since soon after dx.   DKA: never.  Severe hypoglycemia: never.   Pancreatitis: never.   Other: he is on a BID insulin schedule, due to noncompliance; he takes human insulin, due to cost.  He is retired.   Interval history: pt states cbg's vary from 57-200's, but most are in the 100's.  It is in general higher as the day goes on.  He seldom has hypoglycemia, and these episodes are mild.  pt states he feels well in general.  He takes 160 units qam and 60-70 units with supper (because cbg is higher then).   Past Medical History:  Diagnosis Date   Adenomatous colon polyp 06/2001   ALLERGIC RHINITIS 08/03/2007   Allergy    Asthma    BENIGN PROSTATIC HYPERTROPHY 08/03/2007   Chronic back pain    COPD (chronic obstructive pulmonary disease) ([place])    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 09/29/2008   Diabetes mellitus without complication ([place])    x 62-70 years as of 11/08/18    DIABETIC RETINOPATHY, BACKGROUND 09/29/2008   DYSPHAGIA UNSPECIFIED 09/29/2008   ESOPHAGEAL STRICTURE 12/28/2008   GERD 11/28/2008   HYPERLIPIDEMIA 08/03/2007   Kidney stones    Dr. 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   PAIN IN SOFT TISSUES OF LIMB 01/07/2008   UNSPECIFIED ANEMIA 12/29/2007   Unspecified essential hypertension 01/07/2008   URINARY CALCULUS 09/29/2008   Urine incontinence     Past Surgical History:  Procedure Laterality Date   CHOLECYSTECTOMY N/A 11/12/2016   Procedure: LAPAROSCOPIC CHOLECYSTECTOMY WITH INTRAOPERATIVE CHOLANGIOGRAM;  Surgeon: Clayburn Pert, MD;  Location: [facility] ORS;  Service: General;  Laterality: N/A;   COLONOSCOPY     polyp   HAND SURGERY Right    2016   LITHOTRIPSY     SHOULDER SURGERY Left     left, cyst and tumor removed   URINARY SURGERY  2018   Dr Daiva Eves "stretched" urethra around prostate to increase urine flow    Social History   Socioeconomic History   Marital status: Married    Spouse name: Not on file   Number of children: 2   Years of education: Not on file   Highest education level: Not on file  Occupational History   Occupation: [employer]    Employer: [employer] resource strain: Not on file   Food insecurity    Worry: Not on file    Inability: Not on file   Transportation needs    Medical: Not on file    Non-medical: Not on file  Tobacco Use   Smoking status: Never Smoker   Smokeless tobacco: Never Used  Substance and Sexual Activity   Alcohol use: Yes    Comment: occasional beer   Drug use: No   Sexual activity: Not on file  Lifestyle   Physical activity    Days per week: Not on file    Minutes per session: Not on file   Stress: Not on file  Relationships   Social connections    Talks on phone: Not on file    Gets together: Not on file    Attends religious service: Not on file    Active member of  club or organization: Not on file    Attends meetings of clubs or organizations: Not on file    Relationship status: Not on file   Intimate partner violence    Fear of current or ex partner: Not on file    Emotionally abused: Not on file    Physically abused: Not on file    Forced sexual activity: Not on file  Other Topics Concern   Not on file  Social History Narrative   Daily Caffeine Use 1-2 daily   Married    Never smoker    Wears seat belt, safe in relationship    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     cephALEXin (KEFLEX) 500 MG capsule Take 1 capsule (500 mg total) by mouth 2 (two) times daily. 20 capsule 0   cyanocobalamin (,VITAMIN B-12,) 1000  MCG/ML injection Inject 1 mL (1,000 mcg total) into the muscle every 30 (thirty) days. 4 mL 12   Dutasteride-Tamsulosin HCl 0.5-0.4 MG CAPS Take 0.4 mg by mouth daily.     hydrochlorothiazide (HYDRODIURIL) 12.5 MG tablet Take 1 tablet (12.5 mg total) by mouth daily. In am inform pt combination not available 90 tablet 3   Insulin Syringe-Needle U-100 (RELION INSULIN SYR 1CC/30G) 30G X 5/16" 1 ML MISC 1 each by Does not apply route 2 (two) times a day. Use to inject insulin BID; E11.9 100 each 2   Lancets (ONETOUCH ULTRASOFT) lancets Use as instructed 2x daily Dx: 250.00 100 each 5   losartan-hydrochlorothiazide (HYZAAR) 100-12.5 MG tablet Take 1 tablet by mouth daily. 90 tablet 3   MAGNESIUM PO Take 1 tablet by mouth daily.      metoprolol succinate (TOPROL-XL) 25 MG 24 hr tablet Take 1 tablet (25 mg total) by mouth daily. 90 tablet 3   mupirocin ointment (BACTROBAN) 2 % Apply 1 application topically daily. Apply thin layer to affected area on right big toe 22 g 0   NON FORMULARY Prostate Peanut from [place].     omeprazole (PRILOSEC) 40 MG capsule Take 1 capsule (40 mg total) by mouth daily. 90 capsule 1   ONE TOUCH ULTRA TEST test strip TEST TWICE DAILY 100 each 9   simvastatin (ZOCOR) 40 MG tablet Take 1 tablet (40 mg total) by mouth at bedtime. 90 tablet 3   tamsulosin (FLOMAX) 0.4 MG CAPS capsule Take 1 capsule by mouth daily.     traZODone (DESYREL) 50 MG tablet Take 1 tablet (50 mg total) by mouth at bedtime. 90 tablet 2   vitamin E 400 UNIT capsule Take 400 Units by mouth daily.     No current facility-administered medications on file prior to visit.     Allergies  Allergen Reactions   Sulfamethoxazole-Trimethoprim Rash    Family History  Problem Relation Age of Onset   Diabetes Mother    Depression Mother    Prostate cancer Brother    Diabetes Brother    Heart disease Brother    Diabetes Brother    Colon cancer Neg Hx    Esophageal cancer Neg  Hx    Rectal cancer Neg Hx    Stomach cancer Neg Hx     BP (!) 144/70 (BP Location: Left Arm, Patient Position: Sitting, Cuff Size: Large)    Pulse 64    Ht 6' (1.829 m)  Wt 222 lb 3.2 oz (100.8 kg)    SpO2 96%    BMI 30.14 kg/m    Review of Systems Denies LOC    Objective:   Physical Exam VITAL SIGNS:  See vs page GENERAL: no distress  Pulses: dorsalis pedis intact bilat.   MSK: no deformity of the feet CV: trace bilat leg edema Skin:  Shallow 1 cm ulcer on the plantar aspect of the right great toe (he was rx'ed abx).  normal color and temp on the feet. Neuro: sensation is intact to touch on the feet Ext: There is bilateral onychomycosis of the toenails.    Lab Results  Component Value Date   HGBA1C 6.9 (A) 07/11/2019        Assessment & Plan:  HTN: is noted today Insulin-requiring type 2 DM, with DR: The pattern of his cbg's indicates he needs some adjustment in his therapy Foot ulcer: new to me  Patient Instructions  Your blood pressure is high today.  Please see your primary care provider soon, to have it rechecked Please change the insulin to170 units with breakfast, and 40 units with supper However, if you are going to be active, take just 120 units that morning.    Keep the ulcer covered with antibiotic ointment and a bandaid, until it heals.  Please call your primary care provider next week if it is not much better by then.  check your blood sugar twice a day.  vary the time of day when you check, between before the 3 meals, and at bedtime.  also check if you have symptoms of your blood sugar being too high or too low.  please keep a record of the readings and bring it to your next appointment here (or you can bring the meter itself).  You can write it on any piece of paper.  please call us sooner if your blood sugar goes below 70, or if you have a lot of readings over 200.   Please come back for a follow-up appointment in 4 months.

## 2019-07-11 NOTE — Patient Instructions (Addendum)
Your blood pressure is high today.  Please see your primary care provider soon, to have it rechecked Please change the insulin to170 units with breakfast, and 40 units with supper However, if you are going to be active, take just 120 units that morning.    Keep the ulcer covered with antibiotic ointment and a bandaid, until it heals.  Please call your primary care provider next week if it is not much better by then.  check your blood sugar twice a day.  vary the time of day when you check, between before the 3 meals, and at bedtime.  also check if you have symptoms of your blood sugar being too high or too low.  please keep a record of the readings and bring it to your next appointment here (or you can bring the meter itself).  You can write it on any piece of paper.  please call us sooner if your blood sugar goes below 70, or if you have a lot of readings over 200.   Please come back for a follow-up appointment in 4 months.

## 2019-07-14 ENCOUNTER — Telehealth: Payer: Self-pay | Admitting: *Deleted

## 2019-07-14 ENCOUNTER — Other Ambulatory Visit: Payer: Self-pay | Admitting: Internal Medicine

## 2019-07-14 ENCOUNTER — Ambulatory Visit: Payer: PPO | Admitting: Family Medicine

## 2019-07-14 ENCOUNTER — Telehealth: Payer: Self-pay | Admitting: Internal Medicine

## 2019-07-14 NOTE — Telephone Encounter (Signed)
Pt returned call and message given to him regarding his b/p medications. He would like to try the losartan-hydrochlorothiazide 100-25 mg as suggested. He is requesting prescription called into his pharmacy. Will continue toprol XL 25 mg daily and has an appointment with Dr. Loanne Drilling.

## 2019-07-14 NOTE — Telephone Encounter (Signed)
Left message for patient to return call back. [facility] may give and obtain information.  

## 2019-07-14 NOTE — Telephone Encounter (Signed)
For blood pressure  Is patient agreeable to take losartan hctz 100-25 tablet  Stop hctz 12.5 alone and losartan hctz 100-12.5 mg dose  Continue toprol xl 25 mg daily   And  Add norvasc 2.5 mg ? BP elevated Dr. Loanne Drilling appt  Thanks [place]

## 2019-07-15 ENCOUNTER — Other Ambulatory Visit: Payer: Self-pay | Admitting: Internal Medicine

## 2019-07-15 DIAGNOSIS — I10 Essential (primary) hypertension: Secondary | ICD-10-CM

## 2019-07-15 MED ORDER — LOSARTAN POTASSIUM-HCTZ 100-25 MG PO TABS: 1.0000 | ORAL_TABLET | Freq: Every day | ORAL | 3 refills | Status: DC

## 2019-07-15 MED ORDER — AMLODIPINE BESYLATE 2.5 MG PO TABS: 2.5000 mg | ORAL_TABLET | Freq: Every day | ORAL | 3 refills | Status: DC

## 2019-07-15 NOTE — Telephone Encounter (Signed)
Pt returned call and message given to him regarding his b/p medications. He would like to try the losartan-hydrochlorothiazide 100-25 mg as suggested. He is requesting prescription called into his pharmacy. Will continue toprol XL 25 mg daily and has an appointment with Dr. Loanne Drilling.       Documentation    Is he also willing to add norvasc 2.5 mg daily ?

## 2019-07-15 NOTE — Telephone Encounter (Signed)
If PCP suggests it he will do it.

## 2019-07-21 ENCOUNTER — Telehealth: Payer: Self-pay | Admitting: *Deleted

## 2019-07-21 NOTE — Telephone Encounter (Signed)
Yes flu added.

## 2019-07-21 NOTE — Telephone Encounter (Signed)
Copied from [place] [id number]. Topic: General - Inquiry >> Jul 21, 2019 10:59 AM Richardo Priest, NT wrote: Reason for CRM: Patient scheduled for B-12 injection. Wondering if possible to also get flu shot at same time. Please advise

## 2019-07-25 DIAGNOSIS — R0600 Dyspnea, unspecified: Secondary | ICD-10-CM | POA: Diagnosis not present

## 2019-07-25 DIAGNOSIS — E782 Mixed hyperlipidemia: Secondary | ICD-10-CM | POA: Diagnosis not present

## 2019-07-25 DIAGNOSIS — I10 Essential (primary) hypertension: Secondary | ICD-10-CM | POA: Diagnosis not present

## 2019-07-25 DIAGNOSIS — I2510 Atherosclerotic heart disease of native coronary artery without angina pectoris: Secondary | ICD-10-CM | POA: Diagnosis not present

## 2019-07-25 DIAGNOSIS — R0602 Shortness of breath: Secondary | ICD-10-CM | POA: Diagnosis not present

## 2019-07-27 ENCOUNTER — Ambulatory Visit (INDEPENDENT_AMBULATORY_CARE_PROVIDER_SITE_OTHER): Payer: PPO | Admitting: *Deleted

## 2019-07-27 ENCOUNTER — Other Ambulatory Visit: Payer: Self-pay

## 2019-07-27 DIAGNOSIS — Z23 Encounter for immunization: Secondary | ICD-10-CM

## 2019-07-27 DIAGNOSIS — E538 Deficiency of other specified B group vitamins: Secondary | ICD-10-CM | POA: Diagnosis not present

## 2019-07-27 MED ORDER — CYANOCOBALAMIN 1000 MCG/ML IJ SOLN
1000.0000 ug | Freq: Once | INTRAMUSCULAR | Status: AC
  Administered 2019-07-27: 1000 ug via INTRAMUSCULAR

## 2019-07-27 NOTE — Progress Notes (Signed)
Patient presented for B 12 injection to right deltoid, patient voiced no concerns nor showed any signs of distress during injection.  Flu given in left arm.

## 2019-07-28 ENCOUNTER — Ambulatory Visit (INDEPENDENT_AMBULATORY_CARE_PROVIDER_SITE_OTHER): Payer: PPO | Admitting: Internal Medicine

## 2019-07-28 VITALS — Ht 72.0 in | Wt 222.0 lb

## 2019-07-28 DIAGNOSIS — I10 Essential (primary) hypertension: Secondary | ICD-10-CM | POA: Diagnosis not present

## 2019-07-28 MED ORDER — HYDROCHLOROTHIAZIDE 12.5 MG PO TABS: 12.5000 mg | ORAL_TABLET | Freq: Every day | ORAL | 3 refills | Status: DC

## 2019-07-28 MED ORDER — LOSARTAN POTASSIUM 100 MG PO TABS: 100.0000 mg | ORAL_TABLET | Freq: Every day | ORAL | 3 refills | Status: DC

## 2019-07-28 NOTE — Progress Notes (Signed)
Telephone Note  I connected with Joanna Puff  on 07/28/19 at 10:30 AM EDT telephone and verified that I am speaking with the correct person using two identifiers.  Location patient: home Location provider:work or home office Persons participating in the virtual visit: patient, provider  I discussed the limitations of evaluation and management by telemedicine and the availability of in person appointments. The patient expressed understanding and agreed to proceed.   HPI: 1. HTN variable 07/25/19 cards appt 110/80 but at night 134-178/67-84 on metoprolol 25 xl qhs and losartan 100 mg hctz 12.5 mg qd. He is waking up 2-3 x per night to urinate after taking hctz 12.5 qhs and does not want to increase the dose for now   Of note stress test schedule [facility] cards pending    ROS: See pertinent positives and negatives per HPI.  Past Medical History:  Diagnosis Date  . Adenomatous colon polyp 06/2001  . ALLERGIC RHINITIS 08/03/2007  . Allergy   . Asthma   . BENIGN PROSTATIC HYPERTROPHY 08/03/2007  . Chronic back pain   . COPD (chronic obstructive pulmonary disease) ([place])   . 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 Diabetes mellitus without complication ([place])    x [id number] years as of 11/08/18   . DIABETIC RETINOPATHY, BACKGROUND 09/29/2008  . DYSPHAGIA UNSPECIFIED 09/29/2008  . 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 GERD 11/28/2008  . HYPERLIPIDEMIA 08/03/2007  . Kidney stones    Dr. Boneta Lucks  . 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 PAIN IN SOFT TISSUES OF LIMB 01/07/2008  . UNSPECIFIED ANEMIA 12/29/2007  . Unspecified essential hypertension 01/07/2008  . URINARY CALCULUS 09/29/2008  . Urine incontinence     Past Surgical History:  Procedure Laterality Date  . CHOLECYSTECTOMY N/A 11/12/2016   Procedure: LAPAROSCOPIC CHOLECYSTECTOMY WITH INTRAOPERATIVE CHOLANGIOGRAM;  Surgeon: Clayburn Pert, MD;  Location: [facility] ORS;  Service: General;  Laterality: N/A;  . COLONOSCOPY     polyp  .  HAND SURGERY Right    2016  . LITHOTRIPSY    . SHOULDER SURGERY Left    left, cyst and tumor removed  . URINARY SURGERY  2018   Dr Daiva Eves "stretched" urethra around prostate to increase urine flow    Family History  Problem Relation Age of Onset  . Diabetes Mother   . Depression Mother   . Prostate cancer Brother   . Diabetes Brother   . Heart disease Brother   . Diabetes Brother   . Colon cancer Neg Hx   . Esophageal cancer Neg Hx   . Rectal cancer Neg Hx   . 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  amLODipine (NORVASC) 2.5 MG tablet, Take 1 tablet (2.5 mg total) by mouth daily., Disp: 90 tablet, Rfl: 3 .  aspirin 81 MG tablet, Take 81 mg by mouth daily., Disp: , Rfl:  .  cephALEXin (KEFLEX) 500 MG capsule, Take 1 capsule (500 mg total) by mouth 2 (two) times daily., Disp: 20 capsule, Rfl: 0 .  cyanocobalamin (,VITAMIN B-12,) 1000 MCG/ML injection, Inject 1 mL (1,000 mcg total) into the muscle every 30 (thirty) days., Disp: 4 mL, Rfl: 12 .  Dutasteride-Tamsulosin HCl 0.5-0.4 MG CAPS, Take 0.4 mg by mouth daily., Disp: , Rfl:  .  insulin NPH-regular Human (NOVOLIN 70/30 RELION) (70-30) 100  UNIT/ML injection, 170 units with breakfast, and 40 units with supper., Disp: 60 mL, Rfl: 0 .  Insulin Syringe-Needle U-100 (RELION INSULIN SYR 1CC/30G) 30G X 5/16" 1 ML MISC, 1 each by Does not apply route 2 (two) times a day. Use to inject insulin BID; E11.9, Disp: 100 each, Rfl: 2 .  Lancets (ONETOUCH ULTRASOFT) lancets, Use as instructed 2x daily Dx: 250.00, Disp: 100 each, Rfl: 5 .  MAGNESIUM PO, Take 1 tablet by mouth daily. , Disp: , Rfl:  .  metoprolol succinate (TOPROL-XL) 25 MG 24 hr tablet, Take 1 tablet (25 mg total) by mouth daily., Disp: 90 tablet, Rfl: 3 .  mupirocin ointment (BACTROBAN) 2 %, Apply 1 application topically  daily. Apply thin layer to affected area on right big toe, Disp: 22 g, Rfl: 0 .  NON FORMULARY, Prostate Peanut from [place]., Disp: , Rfl:  .  omeprazole (PRILOSEC) 40 MG capsule, Take 1 capsule (40 mg total) by mouth daily., Disp: 90 capsule, Rfl: 1 .  ONE TOUCH ULTRA TEST test strip, TEST TWICE DAILY, Disp: 100 each, Rfl: 9 .  simvastatin (ZOCOR) 40 MG tablet, Take 1 tablet (40 mg total) by mouth at bedtime., Disp: 90 tablet, Rfl: 3 .  tamsulosin (FLOMAX) 0.4 MG CAPS capsule, Take 1 capsule by mouth daily., Disp: , Rfl:  .  traZODone (DESYREL) 50 MG tablet, Take 1 tablet (50 mg total) by mouth at bedtime., Disp: 90 tablet, Rfl: 2 .  vitamin E 400 UNIT capsule, Take 400 Units by mouth daily., Disp: , Rfl:  .  hydrochlorothiazide (HYDRODIURIL) 12.5 MG tablet, Take 1 tablet (12.5 mg total) by mouth daily. In am, Disp: 90 tablet, Rfl: 3 .  losartan (COZAAR) 100 MG tablet, Take 1 tablet (100 mg total) by mouth daily. At night, Disp: 90 tablet, Rfl: 3  EXAM:  VITALS per patient if applicable:  GENERAL: alert, oriented, appears well and in no acute distress  PSYCH/NEURO: pleasant and cooperative, no obvious depression or anxiety, speech and thought processing grossly intact  ASSESSMENT AND PLAN:  Discussed the following assessment and plan:  Essential hypertension - Plan: hydrochlorothiazide (HYDRODIURIL) 12.5 MG tablet in am  losartan (COZAAR) 100 MG tablet qhs with toprol xl 25 mg qd  Log BP at home goal <130/<80  -if BP continues to be high at night consider increase Toprol to 50 mg qhs he reports prev pharmacy could not get this dose but his heart rate does go to higher at night so likely he could tolerate this increase   HM Flu shot utd had 07/27/19 Tdap utd 08/31/18  prevnar and pna 23 utd  Consider shingrix vaccinein future  PSA-pt due to f/u with Dr. Margot Ables f/u sch'ed been >1 year as of 03/30/2019   Hep C negative 07/09/16, check hep A/B with fatty liverrec hep B  vaccineconsider   Never smoker except for cigars   GI Dr. Stark3/12/2018 5 polyps tubular repeat in 3 years   Dermatology established with [facility] last seen 12/02/17  Dr. Raliegh Ip cards appt 07/25/2019 with pending stress test      I discussed the assessment and treatment plan with the patient. The patient was provided an opportunity to ask questions and all were answered. The patient agreed with the plan and demonstrated an understanding of the instructions.   The patient was advised to call back or seek an in-person evaluation if the symptoms worsen or if the condition fails to improve as anticipated.  Time spent 15 minutes Olivia Mackie  Delane Ginger McLean-Scocuzza, MD

## 2019-08-29 ENCOUNTER — Ambulatory Visit: Payer: PPO

## 2019-08-30 ENCOUNTER — Ambulatory Visit (INDEPENDENT_AMBULATORY_CARE_PROVIDER_SITE_OTHER): Payer: PPO

## 2019-08-30 ENCOUNTER — Other Ambulatory Visit: Payer: Self-pay

## 2019-08-30 DIAGNOSIS — E538 Deficiency of other specified B group vitamins: Secondary | ICD-10-CM

## 2019-08-30 MED ORDER — CYANOCOBALAMIN 1000 MCG/ML IJ SOLN
1000.0000 ug | Freq: Once | INTRAMUSCULAR | Status: AC
  Administered 2019-08-30: 1000 ug via INTRAMUSCULAR

## 2019-08-30 NOTE — Progress Notes (Signed)
[name] presents today for injection per MD orders. B12 injection  administered IM in left Upper Arm. Administration without incident. Patient tolerated well. Nina,cma

## 2019-08-31 DIAGNOSIS — R0600 Dyspnea, unspecified: Secondary | ICD-10-CM | POA: Diagnosis not present

## 2019-08-31 DIAGNOSIS — R0602 Shortness of breath: Secondary | ICD-10-CM | POA: Diagnosis not present

## 2019-09-02 ENCOUNTER — Other Ambulatory Visit: Payer: Self-pay

## 2019-09-02 ENCOUNTER — Ambulatory Visit (INDEPENDENT_AMBULATORY_CARE_PROVIDER_SITE_OTHER): Payer: PPO

## 2019-09-02 ENCOUNTER — Other Ambulatory Visit: Payer: Self-pay | Admitting: Internal Medicine

## 2019-09-02 DIAGNOSIS — Z0000 Encounter for general adult medical examination without abnormal findings: Secondary | ICD-10-CM | POA: Diagnosis not present

## 2019-09-02 DIAGNOSIS — I10 Essential (primary) hypertension: Secondary | ICD-10-CM

## 2019-09-02 MED ORDER — LOSARTAN POTASSIUM 100 MG PO TABS: 50.0000 mg | ORAL_TABLET | Freq: Every day | ORAL | Status: DC

## 2019-09-02 NOTE — Progress Notes (Signed)
Noted losartan 50 mg and norvasc 2.5  Continue to watch BP and HR Inform patient   Thanks  [name]

## 2019-09-02 NOTE — Progress Notes (Signed)
Subjective:   [name] is a 73 y.o. male who presents for Medicare Annual/Subsequent preventive examination.  Review of Systems:  No ROS.  Medicare Wellness Virtual Visit.  Visual/audio telehealth visit, UTA vital signs.   See social history for additional risk factors.   Cardiac Risk Factors include: advanced age (>17men, >6 women);hypertension;male gender;diabetes mellitus     Objective:    Vitals: There were no vitals taken for this visit.  There is no height or weight on file to calculate BMI.  Advanced Directives 09/02/2019 08/31/2018 01/23/2017 11/11/2016 11/11/2016 07/09/2016 03/27/2015  Does Patient Have a Medical Advance Directive? No No No No No No No  Would patient like information on creating a medical advance directive? No - Patient declined - - No - Patient declined No - Patient declined - -    Tobacco Social History   Tobacco Use  Smoking Status Never Smoker  Smokeless Tobacco Never Used     Counseling given: Not Answered   Clinical Intake:  Pre-visit preparation completed: Yes        Diabetes: Yes(Followed by pcp)  How often do you need to have someone help you when you read instructions, pamphlets, or other written materials from your doctor or pharmacy?: 1 - Never  Interpreter Needed?: No     Past Medical History:  Diagnosis Date  . Adenomatous colon polyp 06/2001  . ALLERGIC RHINITIS 08/03/2007  . Allergy   . Asthma   . BENIGN PROSTATIC HYPERTROPHY 08/03/2007  . Chronic back pain   . COPD (chronic obstructive pulmonary disease) ([place])   . 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 Diabetes mellitus without complication ([place])    x Q000111Q years as of 11/08/18   . DIABETIC RETINOPATHY, BACKGROUND 09/29/2008  . DYSPHAGIA UNSPECIFIED 09/29/2008  . 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 GERD 11/28/2008  . HYPERLIPIDEMIA 08/03/2007  . Kidney stones    Dr. Boneta Lucks  . 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 PAIN IN SOFT TISSUES OF LIMB  01/07/2008  . UNSPECIFIED ANEMIA 12/29/2007  . Unspecified essential hypertension 01/07/2008  . URINARY CALCULUS 09/29/2008  . Urine incontinence    Past Surgical History:  Procedure Laterality Date  . CHOLECYSTECTOMY N/A 11/12/2016   Procedure: LAPAROSCOPIC CHOLECYSTECTOMY WITH INTRAOPERATIVE CHOLANGIOGRAM;  Surgeon: Clayburn Pert, MD;  Location: [facility] ORS;  Service: General;  Laterality: N/A;  . COLONOSCOPY     polyp  . HAND SURGERY Right    2016  . LITHOTRIPSY    . SHOULDER SURGERY Left    left, cyst and tumor removed  . URINARY SURGERY  2018   Dr Daiva Eves "stretched" urethra around prostate to increase urine flow   Family History  Problem Relation Age of Onset  . Diabetes Mother   . Depression Mother   . Prostate cancer Brother   . Diabetes Brother   . Heart disease Brother   . Diabetes Brother   . Colon cancer Neg Hx   . Esophageal cancer Neg Hx   . Rectal cancer Neg Hx   . Stomach cancer Neg Hx    Social History   Socioeconomic History  . Marital status: Married    Spouse name: Not on file  . Number of children: 2  . Years of education: Not on file  . Highest education level: Not on file  Occupational History  . 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 Financial resource strain: Not hard at all  .  Food insecurity    Worry: Never true    Inability: Never true  . Transportation needs    Medical: No    Non-medical: No  Tobacco Use  . Smoking status: Never Smoker  . Smokeless tobacco: Never Used  Substance and Sexual Activity  . Alcohol use: Yes    Comment: occasional beer  . Drug use: No  . Sexual activity: Not on file  Lifestyle  . Physical activity    Days per week: 1 day    Minutes per session: 60 min  . Stress: Not at all  Relationships  . Social Herbalist on phone: Not on file    Gets together: Not on file    Attends religious service: Not on file    Active member of club or organization: Not on file     Attends meetings of clubs or organizations: Not on file    Relationship status: Not on file  Other Topics Concern  . 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 amLODipine (NORVASC) 2.5 MG tablet Take 1 tablet (2.5 mg total) by mouth daily.  Marland Kitchen aspirin 81 MG tablet Take 81 mg by mouth daily.  . cyanocobalamin (,VITAMIN B-12,) 1000 MCG/ML injection Inject 1 mL (1,000 mcg total) into the muscle every 30 (thirty) days.  . Dutasteride-Tamsulosin HCl 0.5-0.4 MG CAPS Take 0.4 mg by mouth daily.  . hydrochlorothiazide (HYDRODIURIL) 12.5 MG tablet Take 1 tablet (12.5 mg total) by mouth daily. In am  . insulin NPH-regular Human (NOVOLIN 70/30 RELION) (70-30) 100 UNIT/ML injection 170 units with breakfast, and 40 units with supper.  . Insulin Syringe-Needle U-100 (RELION INSULIN SYR 1CC/30G) 30G X 5/16" 1 ML MISC 1 each by Does not apply route 2 (two) times a day. Use to inject insulin BID; E11.9  . Lancets (ONETOUCH ULTRASOFT) lancets Use as instructed 2x daily Dx: 250.00  . losartan (COZAAR) 100 MG tablet Take 1 tablet (100 mg total) by mouth daily. At night (Patient taking differently: Take 50 mg by mouth daily. At night)  . MAGNESIUM PO Take 1 tablet by mouth daily.   . metoprolol succinate (TOPROL-XL) 25 MG 24 hr tablet Take 1 tablet (25 mg total) by mouth daily.  . mupirocin ointment (BACTROBAN) 2 % Apply 1 application topically daily. Apply thin layer to affected area on right big toe  . NON FORMULARY Prostate Peanut from [place].  Marland Kitchen omeprazole (PRILOSEC) 40 MG capsule Take 1 capsule (40 mg total) by mouth daily.  . ONE TOUCH ULTRA TEST test strip TEST TWICE DAILY  . simvastatin (ZOCOR) 40 MG tablet Take 1 tablet (40 mg total) by mouth at bedtime.  . tamsulosin (FLOMAX) 0.4 MG CAPS capsule Take  1 capsule by mouth daily.  . traZODone (DESYREL) 50 MG tablet Take 1 tablet (50 mg total) by mouth at bedtime.  . vitamin E 400 UNIT capsule Take 400 Units by mouth daily.  . [DISCONTINUED] cephALEXin (KEFLEX) 500 MG capsule Take 1 capsule (500 mg total) by mouth 2 (two) times daily.   No facility-administered encounter medications on file as of 09/02/2019.     Activities of Daily Living In your present state of health, do you have any difficulty performing the following  activities: 09/02/2019  Hearing? Y  Vision? N  Difficulty concentrating or making decisions? N  Walking or climbing stairs? N  Dressing or bathing? N  Doing errands, shopping? N  Preparing Food and eating ? N  Using the Toilet? N  In the past six months, have you accidently leaked urine? N  Do you have problems with loss of bowel control? N  Managing your Medications? N  Managing your Finances? N  Housekeeping or managing your Housekeeping? N  Some recent data might be hidden    Patient Care Team: McLean-Scocuzza, Nino Glow, MD as PCP - General (Internal Medicine)   Assessment:   This is a routine wellness examination for Shakeem.  I connected with patient 09/02/19 at  9:30 AM EDT by an audio enabled telemedicine application and verified that I am speaking with the correct person using two identifiers. Patient stated full name and DOB. Patient gave permission to continue with virtual visit. Patient's location was at home and Nurse's location was at [place] office.   Health Maintenance Due: -Hgb A1c- 07/11/19 (6.9)  Update all pending maintenance due as appropriate.   See completed HM at the end of note.   Eye: Visual acuity not assessed. Virtual visit. Wears corrective lenses. Followed by their ophthalmologist every 12 months.  Retinopathy- yes  Dental: Visits every 6 months.    Hearing: Difficulty hearing conversational tones.  Followed by [facility]. Hearing aids- refused.  Safety:  Patient feels safe at home-  yes Patient does have smoke detectors at home- yes Patient does wear sunscreen or protective clothing when in direct sunlight - yes Patient does wear seat belt when in a moving vehicle - yes Patient drives- yes Adequate lighting in walkways free from debris- yes Grab bars and handrails used as appropriate- yes Ambulates with no assistive device Cell phone on person when ambulating outside of the home- yes  Social: Alcohol intake - yes      Smoking history- never  Smokers in home? none Illicit drug use? none  Depression: PHQ 2 &9 complete. See screening below. Denies irritability, anhedonia, sadness/tearfullness.  Stable.   Falls: See screening below.    Medication: Taking losartan as 50mg  in the morning and amlodipine 2.5mg  daily. Patient states, " When I take losartan 100mg  as written and amlodipine 2.5 as written it drops my BP too low and raises my HR over 100. Since changing losartan dose and time my BP is averaging 120s/60s and HR60s." I encouraged patient to always consult with his doctor before making medication changes. Continue to monitor and notify if fluctuating before upcoming appointment 10/11/19. Agreed.   Covid-19: Precautions and sickness symptoms discussed. Wears mask, social distancing, hand hygiene as appropriate.   Activities of Daily Living Patient denies needing assistance with: household chores, feeding themselves, getting from bed to chair, getting to the toilet, bathing/showering, dressing, managing money, or preparing meals.   Memory: Patient is alert. Patient denies difficulty focusing or concentrating. Correctly identified the president of the [place], season and recall.  BMI- discussed the importance of a healthy diet, water intake and the benefits of aerobic exercise.  Educational material provided.  Physical activity- yard work  Diet: regular Water: good intake Caffeine: 3 cups of coffee  Other Providers Patient Care Team: McLean-Scocuzza, Nino Glow, MD as PCP - General (Internal Medicine) Exercise Activities and Dietary recommendations Current Exercise Habits: Home exercise routine, Time (Minutes): 60, Frequency (Times/Week): 1, Weekly Exercise (Minutes/Week): 60, Intensity: Mild  Goals  Patient Stated   . DIET - INCREASE WATER INTAKE (pt-stated)     I need to drink 24 more ounces        Fall Risk Fall Risk  09/02/2019 07/28/2019 03/30/2019 07/09/2016  Falls in the past year? 0 0 0 No   Timed Get Up and Go Performed: no, virtual visit  Depression Screen PHQ 2/9 Scores 09/02/2019 07/28/2019 03/30/2019 07/09/2016  PHQ - 2 Score 0 0 1 0    Cognitive Function     6CIT Screen 09/02/2019  What Year? 0 points  What month? 0 points  What time? 0 points  Count back from 20 0 points  Months in reverse 0 points  Repeat phrase 0 points  Total Score 0    Immunization History  Administered Date(s) Administered  . Fluad Quad(high Dose 65+) 07/27/2019  . Influenza Whole 08/24/2009, 08/25/2011  . Influenza, High Dose Seasonal PF 08/21/2016, 08/25/2017, 08/31/2018  . Influenza,inj,Quad PF,6+ Mos 10/29/2015  . Pneumococcal Conjugate-13 01/23/2017  . Pneumococcal Polysaccharide-23 09/25/2003, 03/27/2015  . Td 09/25/2003  . Tdap 08/31/2018   Screening Tests Health Maintenance  Topic Date Due  . FOOT EXAM  12/02/2019  . HEMOGLOBIN A1C  01/11/2020  . OPHTHALMOLOGY EXAM  06/07/2020  . COLONOSCOPY  01/23/2022  . TETANUS/TDAP  08/31/2028  . INFLUENZA VACCINE  Completed  . Hepatitis C Screening  Completed  . PNA vac Low Risk Adult  Completed       Plan:   Keep all routine maintenance appointments.   Follow up 10/11/19 @ 2:00  Medicare Attestation I have personally reviewed: The patient's medical and social history Their use of alcohol, tobacco or illicit drugs Their current medications and supplements The patient's functional ability including ADLs,fall risks, home safety risks, cognitive, and hearing and visual impairment  Diet and physical activities Evidence for depression   In addition, I have reviewed and discussed with patient certain preventive protocols, quality metrics, and best practice recommendations. A written personalized care plan for preventive services as well as general preventive health recommendations were provided to patient via mail.     Varney Biles, LPN  [phone]

## 2019-09-02 NOTE — Patient Instructions (Addendum)
  [name] , Thank you for taking time to come for your Medicare Wellness Visit. I appreciate your ongoing commitment to your health goals. Please review the following plan we discussed and let me know if I can assist you in the future.   These are the goals we discussed: Goals      Patient Stated   . DIET - INCREASE WATER INTAKE (pt-stated)     I need to drink 24 more ounces        This is a list of the screening recommended for you and due dates:  Health Maintenance  Topic Date Due  . Complete foot exam   12/02/2019  . Hemoglobin A1C  01/11/2020  . Eye exam for diabetics  06/07/2020  . Colon Cancer Screening  01/23/2022  . Tetanus Vaccine  08/31/2028  . Flu Shot  Completed  .  Hepatitis C: One time screening is recommended by Center for Disease Control  (CDC) for  adults born from [birth year] through 1963.   Completed  . Pneumonia vaccines  Completed

## 2019-09-13 ENCOUNTER — Other Ambulatory Visit: Payer: Self-pay | Admitting: Internal Medicine

## 2019-09-13 MED ORDER — OMEPRAZOLE 40 MG PO CPDR: 40.0000 mg | DELAYED_RELEASE_CAPSULE | Freq: Every day | ORAL | 3 refills | Status: DC

## 2019-09-14 DIAGNOSIS — M5489 Other dorsalgia: Secondary | ICD-10-CM | POA: Diagnosis not present

## 2019-09-14 DIAGNOSIS — E6609 Other obesity due to excess calories: Secondary | ICD-10-CM | POA: Diagnosis not present

## 2019-09-14 DIAGNOSIS — R972 Elevated prostate specific antigen [PSA]: Secondary | ICD-10-CM | POA: Diagnosis not present

## 2019-09-14 DIAGNOSIS — E669 Obesity, unspecified: Secondary | ICD-10-CM | POA: Diagnosis not present

## 2019-09-14 DIAGNOSIS — N401 Enlarged prostate with lower urinary tract symptoms: Secondary | ICD-10-CM | POA: Diagnosis not present

## 2019-09-15 ENCOUNTER — Other Ambulatory Visit: Payer: Self-pay | Admitting: Urology

## 2019-09-15 DIAGNOSIS — N200 Calculus of kidney: Secondary | ICD-10-CM

## 2019-09-15 DIAGNOSIS — E782 Mixed hyperlipidemia: Secondary | ICD-10-CM | POA: Diagnosis not present

## 2019-09-15 DIAGNOSIS — I10 Essential (primary) hypertension: Secondary | ICD-10-CM | POA: Diagnosis not present

## 2019-09-15 DIAGNOSIS — I2510 Atherosclerotic heart disease of native coronary artery without angina pectoris: Secondary | ICD-10-CM | POA: Diagnosis not present

## 2019-09-15 DIAGNOSIS — E119 Type 2 diabetes mellitus without complications: Secondary | ICD-10-CM | POA: Diagnosis not present

## 2019-09-15 DIAGNOSIS — R0600 Dyspnea, unspecified: Secondary | ICD-10-CM | POA: Diagnosis not present

## 2019-09-23 ENCOUNTER — Ambulatory Visit: Payer: PPO

## 2019-09-26 ENCOUNTER — Other Ambulatory Visit: Payer: Self-pay

## 2019-09-26 ENCOUNTER — Ambulatory Visit
Admission: RE | Admit: 2019-09-26 | Discharge: 2019-09-26 | Disposition: A | Discharge status: Home / Self Care | Payer: PPO | Source: Ambulatory Visit | Attending: Urology | Admitting: Urology

## 2019-09-26 DIAGNOSIS — N200 Calculus of kidney: Secondary | ICD-10-CM | POA: Diagnosis not present

## 2019-09-26 DIAGNOSIS — R109 Unspecified abdominal pain: Secondary | ICD-10-CM | POA: Diagnosis not present

## 2019-09-26 IMAGING — CT CT ABD-PELV W/O CM
2 of 4 series · 16 of 46 positions shown, 18 images · non-contrast
Comparison: [DATE]

CLINICAL DATA: Right flank pain x2 weeks, history of kidney stones
status post lithotripsy

EXAM:
CT ABDOMEN AND PELVIS WITHOUT CONTRAST
TECHNIQUE: Multidetector CT imaging of the abdomen and pelvis was performed
following the standard protocol without IV contrast.

[Series 2: axials routine abdomen pelvis without 5.00 · axial · non-contrast · 0.75mm/px · z∈[-1574,-1119]mm · 13 of 101 slices shown, 15 images]
[im 5/101  soft-tissue]
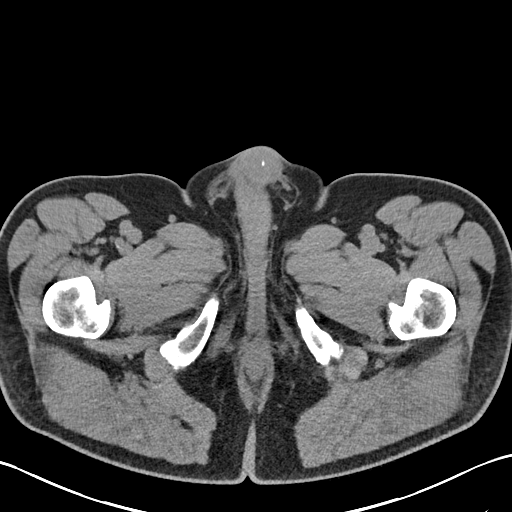
[im 5/101  bone]
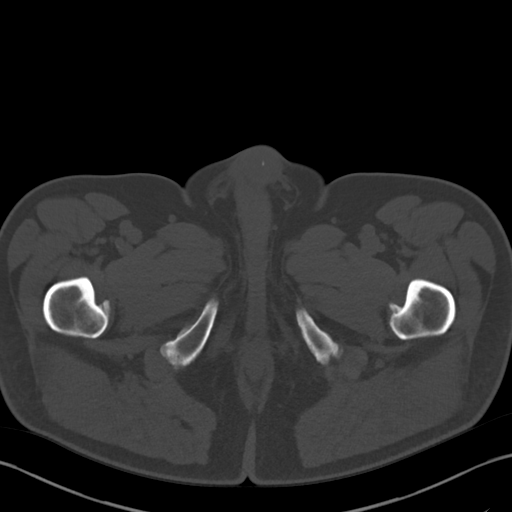
[im 13/101  soft-tissue]
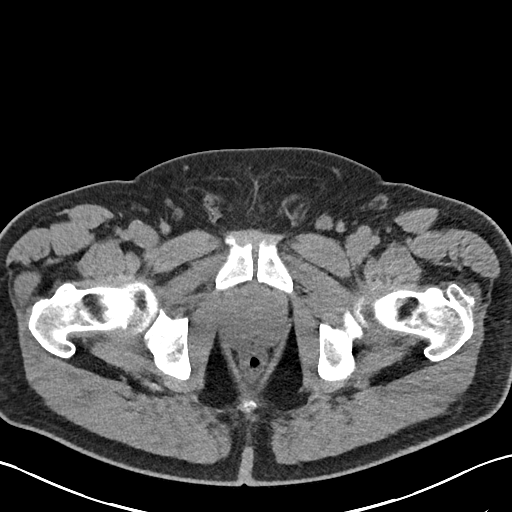
[im 21/101  soft-tissue]
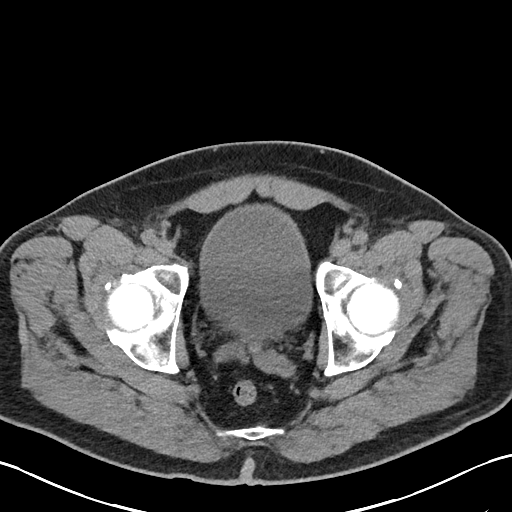
[im 30/101  soft-tissue]
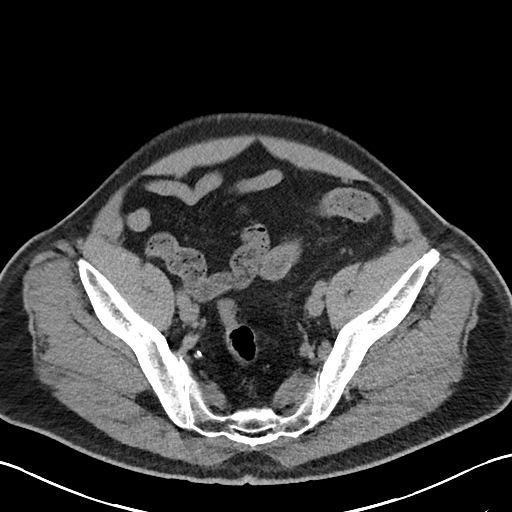
[im 34/101  soft-tissue]
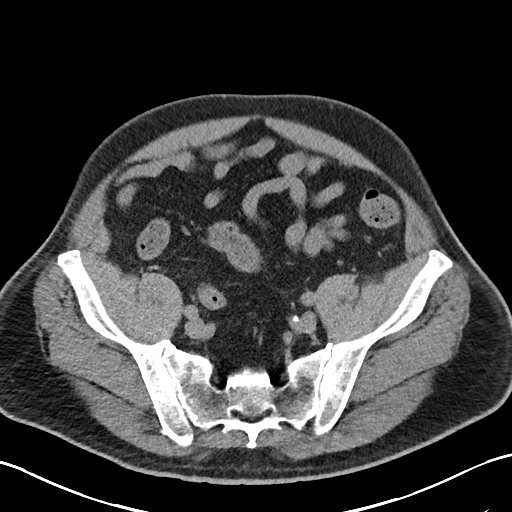
[im 42/101  soft-tissue]
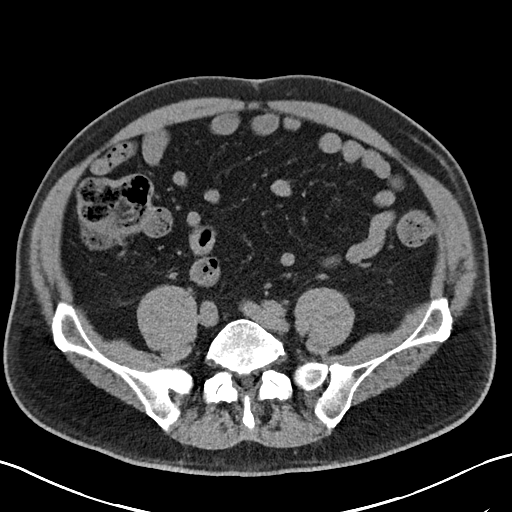
[im 51/101  soft-tissue]
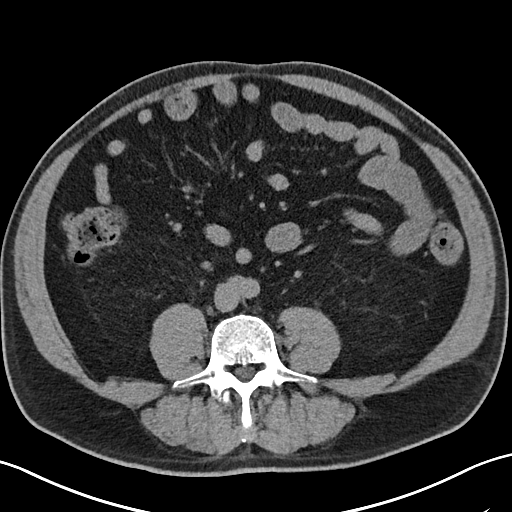
[im 59/101  soft-tissue]
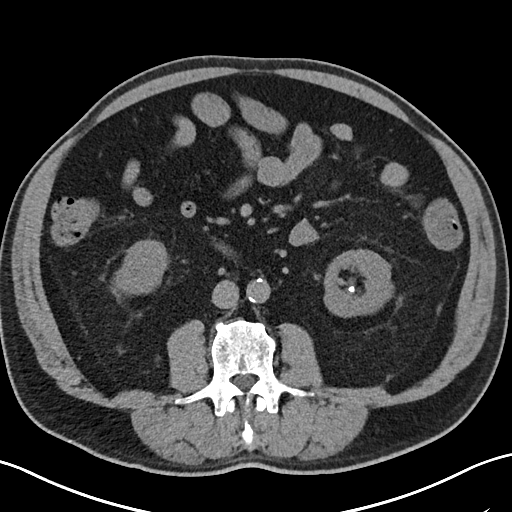
[im 67/101  soft-tissue]
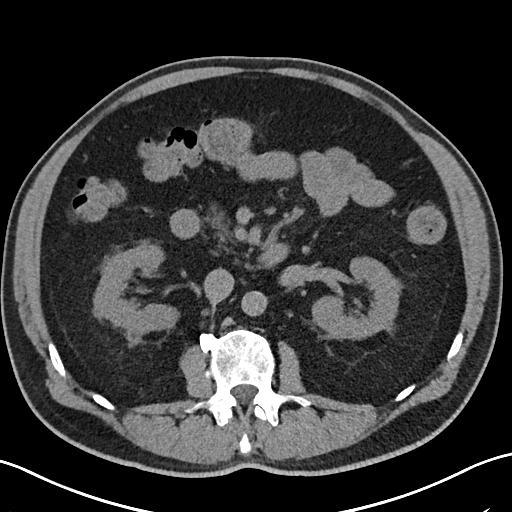
[im 67/101  bone]
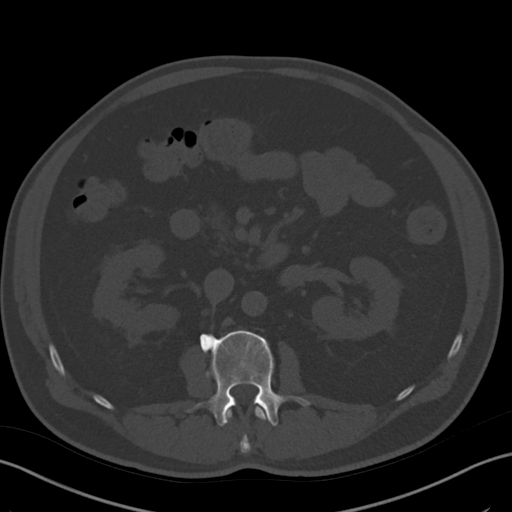
[im 71/101  soft-tissue]
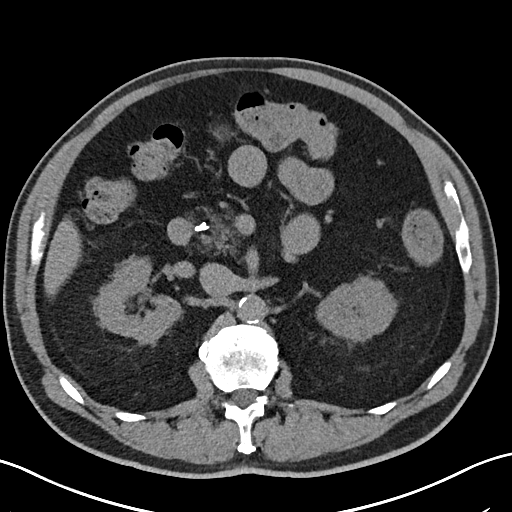
[im 80/101  soft-tissue]
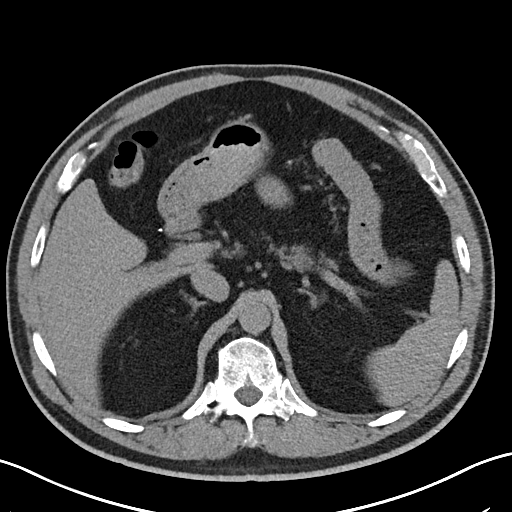
[im 88/101  soft-tissue]
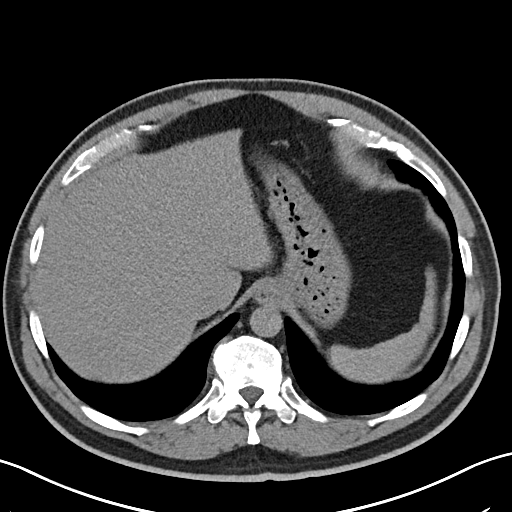
[im 96/101  soft-tissue]
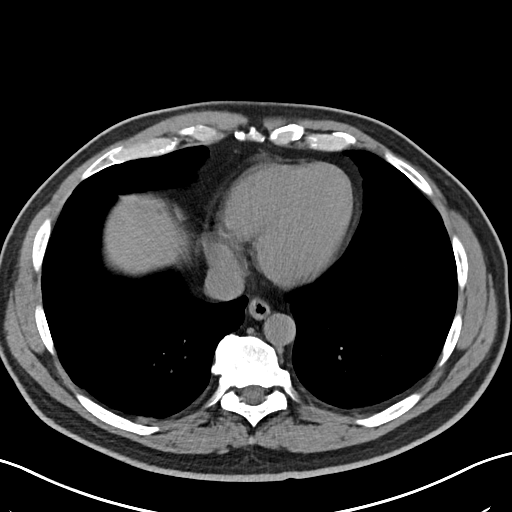

[Series 4: coronals routine abdomen pelvis without 2.00 cor · coronal · non-contrast · 0.75mm/px · 3 of 163 slices shown]
[im 55/163  soft-tissue]
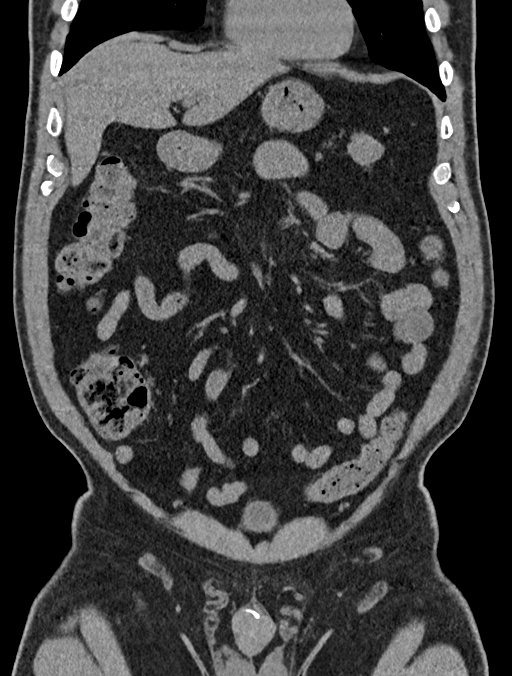
[im 73/163  soft-tissue]
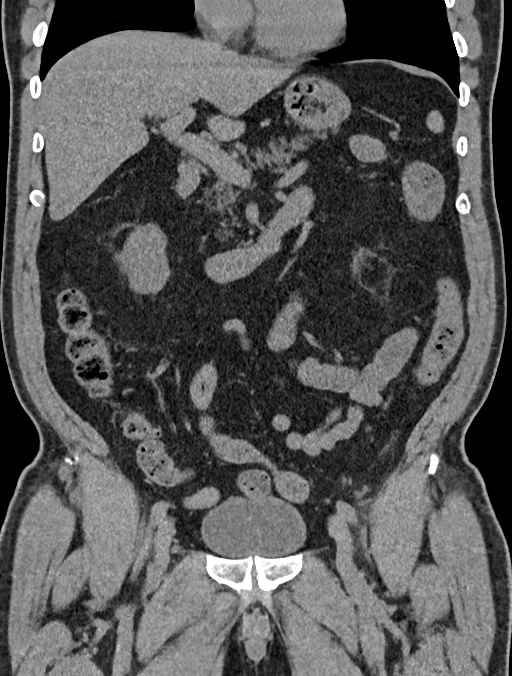
[im 91/163  soft-tissue]
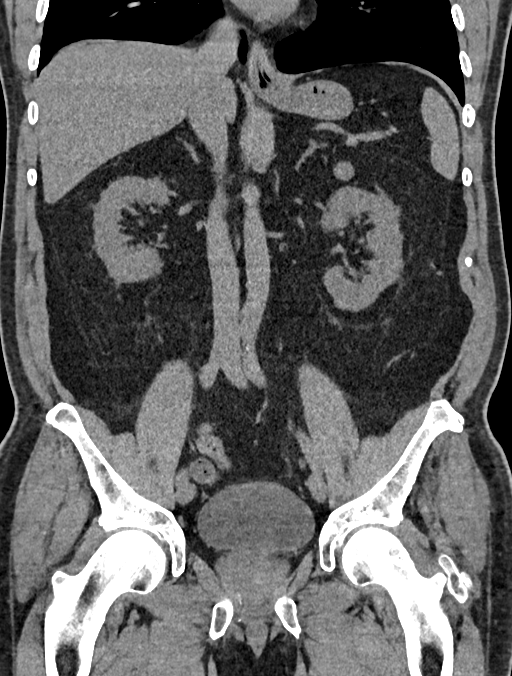

[16 of 46 positions shown; findings below may reference images not displayed]

FINDINGS: Lower chest: Lung bases are clear.

Hepatobiliary: Unenhanced liver is unremarkable.

Status post cholecystectomy. No intrahepatic or extrahepatic ductal
dilatation.

Pancreas: Within normal limits.

Spleen: Within normal limits.

Adrenals/Urinary Tract: Adrenal glands are within normal limits.

4 mm nonobstructing left lower pole renal calculus (series 2/image
43).

7 mm exophytic right lower pole renal lesion (series 2/image 46),
possibly reflecting a hyperdense cyst, indeterminate but unchanged.
However, there is a 2.1 x 1.7 cm hyperdense exophytic left upper
pole renal lesion (series 2/image 26), previously 1.7 x 1.1 cm,
progressive.

No ureteral or bladder calculi.  No hydronephrosis.

Bladder is within normal limits.

Stomach/Bowel: Stomach is within normal limits.

No evidence of bowel obstruction.

Normal appendix (series 2/image 64).

No colonic wall thickening or inflammatory changes.

Vascular/Lymphatic: No evidence of abdominal aortic aneurysm.

Atherosclerotic calcifications of the abdominal aorta and branch
vessels.

No suspicious abdominopelvic lymphadenopathy.

Reproductive: Prostatomegaly.

Other: No abdominopelvic ascites.

Musculoskeletal: Degenerative changes of the visualized
thoracolumbar spine.
IMPRESSION: 4 mm nonobstructing left lower pole renal calculus. No ureteral or
bladder calculi. No hydronephrosis.

Bilateral renal lesions, including a 2.1 cm exophytic left upper
pole renal lesion which is hyperdense and progressive from the
prior. Consider MRI abdomen with/without contrast for further
evaluation.

Prostatomegaly.

Additional ancillary findings as above.

## 2019-09-28 DIAGNOSIS — N200 Calculus of kidney: Secondary | ICD-10-CM | POA: Diagnosis not present

## 2019-09-28 DIAGNOSIS — N281 Cyst of kidney, acquired: Secondary | ICD-10-CM | POA: Diagnosis not present

## 2019-09-28 DIAGNOSIS — M5489 Other dorsalgia: Secondary | ICD-10-CM | POA: Diagnosis not present

## 2019-09-29 ENCOUNTER — Other Ambulatory Visit: Payer: Self-pay | Admitting: Urology

## 2019-09-29 DIAGNOSIS — N281 Cyst of kidney, acquired: Secondary | ICD-10-CM

## 2019-10-10 ENCOUNTER — Telehealth: Payer: Self-pay | Admitting: Internal Medicine

## 2019-10-10 NOTE — Telephone Encounter (Signed)
I called pt twice and left vm to call ofc. °

## 2019-10-11 ENCOUNTER — Ambulatory Visit: Admission: RE | Admit: 2019-10-11 | Payer: PPO | Source: Ambulatory Visit

## 2019-10-11 ENCOUNTER — Ambulatory Visit: Payer: PPO | Admitting: Internal Medicine

## 2019-10-11 ENCOUNTER — Telehealth: Payer: Self-pay | Admitting: *Deleted

## 2019-10-11 NOTE — Telephone Encounter (Signed)
Copied from [place] [id number]. Topic: Clinical - COVID Pre-Screen >> Oct 11, 2019 11:31 AM Alease Frame wrote: 1. To the best of your knowledge, have you been in close contact with anyone with a confirmed diagnosis of COVID 19?  NO  If no - Proceed to next question; If yes - Schedule patient for a virtual visit  2. Have you had any one or more of the following: fever, chills, cough, shortness of breath or any flu-like symptoms?  NO  If no - Proceed to next question; If yes - Schedule patient for a virtual visit  3. Have you been diagnosed with or have a previous diagnosis of COVID 19?  NO  If no - Proceed to next question; If yes - Schedule patient for a virtual visit  4. I am going to go over a few other symptoms with you. Please let me know if you are experiencing any of the following: No  Ear, nose or throat discomfort  A sore throat  Headache  Muscle pain  Diarrhea  Loss of taste or smell  If no - Continue with scheduling process; If yes - Document in scheduling notes   Thank you for answering these questions. Please know we will ask you these questions or similar questions when you arrive for your appointment and again it's how we are keeping everyone safe. Also, to keep you safe, please use the provided hand sanitizer when you enter the building. [name], we are asking everyone in the building to wear a mask because they help [place] prevent the spread of germs.   Do you have a mask of your own, if not, we are happy to provide one for you. The last thing I want to go over with you is the no visitor guidelines. This means no one can attend the appointment with you unless you need physical assistance. I understand this may be different from your past appointments and I know this may be difficult but please know if someone is driving you we are happy to call them for you once your appointment is over.

## 2019-10-12 ENCOUNTER — Other Ambulatory Visit: Payer: Self-pay

## 2019-10-13 ENCOUNTER — Ambulatory Visit (INDEPENDENT_AMBULATORY_CARE_PROVIDER_SITE_OTHER): Payer: PPO

## 2019-10-13 DIAGNOSIS — E538 Deficiency of other specified B group vitamins: Secondary | ICD-10-CM

## 2019-10-13 MED ORDER — CYANOCOBALAMIN 1000 MCG/ML IJ SOLN
1000.0000 ug | Freq: Once | INTRAMUSCULAR | Status: AC
  Administered 2019-10-13: 1000 ug via INTRAMUSCULAR

## 2019-10-13 NOTE — Progress Notes (Signed)
[name] presents today for injection per MD orders. B12 injection  administered IM in left Upper Arm. Administration without incident. Patient tolerated well.  Patient stated he is cancelling his B12 medication at the pharmacy because his wife cannot give him the injections properly and he wants to come here and continue his therapy, no longer needs the prescription to go to the pharmacy.  Nina,cma

## 2019-10-14 ENCOUNTER — Other Ambulatory Visit: Payer: Self-pay

## 2019-10-14 ENCOUNTER — Ambulatory Visit (INDEPENDENT_AMBULATORY_CARE_PROVIDER_SITE_OTHER): Payer: PPO | Admitting: Internal Medicine

## 2019-10-14 ENCOUNTER — Encounter: Payer: Self-pay | Admitting: Internal Medicine

## 2019-10-14 VITALS — BP 142/68 | Ht 72.0 in | Wt 220.0 lb

## 2019-10-14 DIAGNOSIS — R0600 Dyspnea, unspecified: Secondary | ICD-10-CM | POA: Diagnosis not present

## 2019-10-14 DIAGNOSIS — I10 Essential (primary) hypertension: Secondary | ICD-10-CM | POA: Diagnosis not present

## 2019-10-14 DIAGNOSIS — E785 Hyperlipidemia, unspecified: Secondary | ICD-10-CM | POA: Diagnosis not present

## 2019-10-14 DIAGNOSIS — N400 Enlarged prostate without lower urinary tract symptoms: Secondary | ICD-10-CM

## 2019-10-14 DIAGNOSIS — R0609 Other forms of dyspnea: Secondary | ICD-10-CM

## 2019-10-14 NOTE — Progress Notes (Signed)
Telephone Note  I connected with [name]   on 10/14/19 at  1:30 PM EST by telehpone and verified that I am speaking with the correct person using two identifiers.  Location patient: home Location provider:work or home office Persons participating in the virtual visit: patient, provider  I discussed the limitations of evaluation and management by telemedicine and the availability of in person appointments. The patient expressed understanding and agreed to proceed.   HPI: 1. HTN f/u BP 132/67 today 142/68 at times low 100s/60s and he feels bad taking toprol xl 25 mg qd, los-hctz 100-25 and norvasc 2.5 and was taking extra hctz 12.5 mg qd advised him to stop extra hctz 12.5 mg dose and reviewed his meds as he looked at bottles today  2. Has not had pfts per cards to w/u copd nor MRI ab per urology but will reschedule  3. HLD cards changed from zocor 40 to lipitor 20 mg qhs   ROS: See pertinent positives and negatives per HPI.  Past Medical History:  Diagnosis Date  . Adenomatous colon polyp 06/2001  . ALLERGIC RHINITIS 08/03/2007  . Allergy   . Asthma   . BENIGN PROSTATIC HYPERTROPHY 08/03/2007  . Chronic back pain   . COPD (chronic obstructive pulmonary disease) ([name])   . 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 Diabetes mellitus without complication ([name])    x Q000111Q years as of 11/08/18   . DIABETIC RETINOPATHY, BACKGROUND 09/29/2008  . DYSPHAGIA UNSPECIFIED 09/29/2008  . 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 GERD 11/28/2008  . HYPERLIPIDEMIA 08/03/2007  . Kidney stones    Dr. Boneta Lucks  . 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 PAIN IN SOFT TISSUES OF LIMB 01/07/2008  . UNSPECIFIED ANEMIA 12/29/2007  . Unspecified essential hypertension 01/07/2008  . URINARY CALCULUS 09/29/2008  . Urine incontinence     Past Surgical History:  Procedure Laterality Date  . CHOLECYSTECTOMY N/A 11/12/2016   Procedure: LAPAROSCOPIC CHOLECYSTECTOMY WITH INTRAOPERATIVE CHOLANGIOGRAM;  Surgeon:  Joshua Pert, MD;  Location: [facility] ORS;  Service: General;  Laterality: N/A;  . COLONOSCOPY     polyp  . HAND SURGERY Right    2016  . LITHOTRIPSY    . SHOULDER SURGERY Left    left, cyst and tumor removed  . URINARY SURGERY  2018   Dr [name] "stretched" urethra around prostate to increase urine flow    Family History  Problem Relation Age of Onset  . Diabetes Mother   . Depression Mother   . Prostate cancer Brother   . Diabetes Brother   . Heart disease Brother   . Diabetes Brother   . Colon cancer Neg Hx   . Esophageal cancer Neg Hx   . Rectal cancer Neg Hx   . Stomach cancer Neg Hx     SOCIAL HX: married    Current Outpatient Medications:  .  amLODipine (NORVASC) 2.5 MG tablet, Take 1 tablet (2.5 mg total) by mouth daily., Disp: 90 tablet, Rfl: 3 .  aspirin 81 MG tablet, Take 81 mg by mouth daily., Disp: , Rfl:  .  atorvastatin (LIPITOR) 20 MG tablet, Take 20 mg by mouth daily at 6 PM., Disp: , Rfl:  .  cyanocobalamin (,VITAMIN B-12,) 1000 MCG/ML injection, Inject 1 mL (1,000 mcg total) into the muscle every 30 (thirty) days., Disp: 4 mL, Rfl: 12 .  Dutasteride-Tamsulosin HCl 0.5-0.4 MG CAPS, Take 0.4 mg by mouth daily., Disp: , Rfl:  .  insulin NPH-regular Human (NOVOLIN 70/30  RELION) (70-30) 100 UNIT/ML injection, 170 units with breakfast, and 40 units with supper., Disp: 60 mL, Rfl: 0 .  Insulin Syringe-Needle U-100 (RELION INSULIN SYR 1CC/30G) 30G X 5/16" 1 ML MISC, 1 each by Does not apply route 2 (two) times a day. Use to inject insulin BID; E11.9, Disp: 100 each, Rfl: 2 .  Lancets (ONETOUCH ULTRASOFT) lancets, Use as instructed 2x daily Dx: 250.00, Disp: 100 each, Rfl: 5 .  losartan-hydrochlorothiazide (HYZAAR) 100-25 MG tablet, Take 1 tablet by mouth daily., Disp: , Rfl:  .  MAGNESIUM PO, Take 1 tablet by mouth daily. , Disp: , Rfl:  .  metoprolol succinate (TOPROL-XL) 25 MG 24 hr tablet, Take 1 tablet (25 mg total) by mouth daily., Disp: 90 tablet, Rfl:  3 .  mupirocin ointment (BACTROBAN) 2 %, Apply 1 application topically daily. Apply thin layer to affected area on right big toe, Disp: 22 g, Rfl: 0 .  NON FORMULARY, Prostate Peanut from [place]., Disp: , Rfl:  .  omeprazole (PRILOSEC) 40 MG capsule, Take 1 capsule (40 mg total) by mouth daily., Disp: 90 capsule, Rfl: 3 .  ONE TOUCH ULTRA TEST test strip, TEST TWICE DAILY, Disp: 100 each, Rfl: 9 .  tamsulosin (FLOMAX) 0.4 MG CAPS capsule, Take 1 capsule by mouth daily., Disp: , Rfl:  .  traZODone (DESYREL) 50 MG tablet, Take 1 tablet (50 mg total) by mouth at bedtime., Disp: 90 tablet, Rfl: 2 .  vitamin E 400 UNIT capsule, Take 400 Units by mouth daily., Disp: , Rfl:   EXAM:  VITALS per patient if applicable:  GENERAL: alert, oriented, appears well and in no acute distress  PSYCH/NEURO: pleasant and cooperative, no obvious depression or anxiety, speech and thought processing grossly intact  ASSESSMENT AND PLAN:  Discussed the following assessment and plan:  Essential hypertension Corrected meds in chart asked he stop extra hctz 12.5  Monitor bp my chart in 2 weeks   Hyperlipidemia, unspecified hyperlipidemia type Now on lipitor 20 mg qhs   Dyspnea on exertion Needs pfts will resch   Benign prostatic hyperplasia, unspecified whether lower urinary tract symptoms present Will resch MRI ab  HM Flu shot utd had 07/27/19 Tdap utd 08/31/18  prevnar and pna 23 utd  Consider shingrix vaccinein future  PSA-pt due to f/u with Dr. Margot Figueroa f/u sch'ed been >1 year as of 10/14/2019 -MRI ab w/w/o canceled resch 11/01/2019  Hep C negative 07/09/16, check hep A/B with fatty liverrec hep B vaccineconsider   Never smoker except for cigars   GI Dr. Stark3/12/2018 5 polyps tubular repeat in 3 years  Dermatology [facility] with [facility] last seen 12/02/17  Dr. Raliegh Ip cardsappt 10/2020had stress test not pfts yet    -we discussed possible serious and likely etiologies,  options for evaluation and workup, limitations of telemedicine visit vs in person visit, treatment, treatment risks and precautions. Pt prefers to treat via telemedicine empirically rather then risking or undertaking an in person visit at this moment. Patient agrees to seek prompt in person care if worsening, new symptoms arise, or if is not improving with treatment.   I discussed the assessment and treatment plan with the patient. The patient was provided an opportunity to ask questions and all were answered. The patient agreed with the plan and demonstrated an understanding of the instructions.   The patient was advised to call back or seek an in-person evaluation if the symptoms worsen or if the condition fails to improve as anticipated.  Time spent  20 minutes   Delorise Jackson, MD

## 2019-10-24 ENCOUNTER — Telehealth: Payer: Self-pay | Admitting: Internal Medicine

## 2019-10-24 NOTE — Telephone Encounter (Signed)
Staff message received from Dr. Terese Door to waive no show fee.   I emailed charge corrections to waive the $50 NS Fee out of courtesy for the following date of service 10/14/2019.  No need to route note for documentation purposes only.

## 2019-11-01 ENCOUNTER — Ambulatory Visit
Admission: RE | Admit: 2019-11-01 | Discharge: 2019-11-01 | Disposition: A | Discharge status: Home / Self Care | Payer: PPO | Source: Ambulatory Visit | Attending: Urology | Admitting: Urology

## 2019-11-01 ENCOUNTER — Other Ambulatory Visit: Payer: Self-pay

## 2019-11-01 DIAGNOSIS — N281 Cyst of kidney, acquired: Secondary | ICD-10-CM | POA: Diagnosis not present

## 2019-11-01 IMAGING — MR MR ABDOMEN WO/W CM
18 series · 48 of 48 positions shown · IV contrast (gadavist)
Comparison: CT [DATE]

CLINICAL DATA: Renal cysts. Complex cyst of the upper pole LEFT
kidney identified on CT. MRI recommended for further
characterization.

EXAM:
MRI ABDOMEN WITHOUT AND WITH CONTRAST
TECHNIQUE: Multiplanar multisequence MR imaging of the abdomen was performed
both before and after the administration of intravenous contrast.
CONTRAST:  10mL GADAVIST GADOBUTROL 1 MMOL/ML IV SOLN

[Series 2: cor haste · coronal · 6.0mm · 1.19mm/px · 2 of 32 slices shown]
[im 1/32]
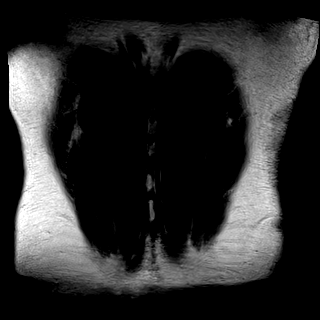
[im 32/32]
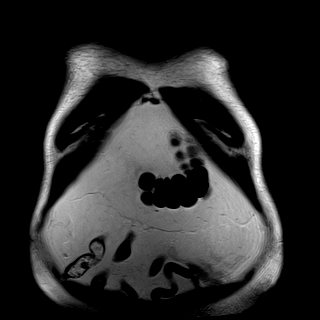

[Series 4: T2 fat-sat · axial · 6.0mm · 1.19mm/px · z∈[-156,+89]mm · 2 of 35 slices shown]
[im 1/35]
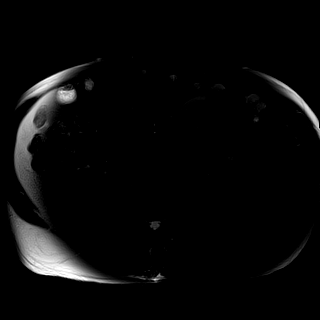
[im 35/35]
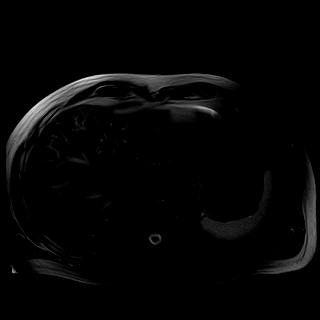

[Series 6: DWI · axial · 6.0mm · 1.42mm/px · z∈[-156,+89]mm · 5 of 105 slices shown (1 of 2)]
[im 1/105]
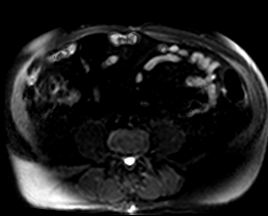
[im 27/105]
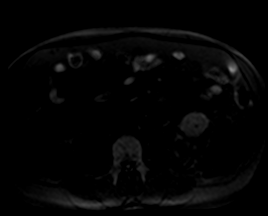
[im 53/105]
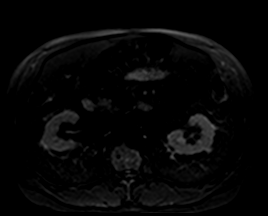
[im 79/105]
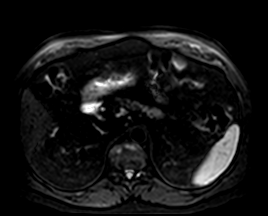
[im 105/105]
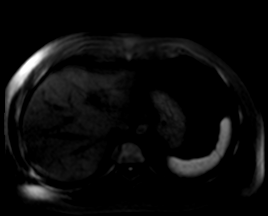

[Series 7: DWI · axial · 6.0mm · 1.42mm/px · 1 of 35 slices shown (2 of 2)]
[im 1/35]
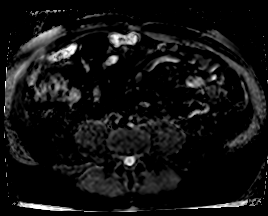

[Series 8: ax in & · axial · 3.0mm · 1.19mm/px · z∈[-140,+73]mm · 3 of 72 slices shown (1 of 2)]
[im 1/72]
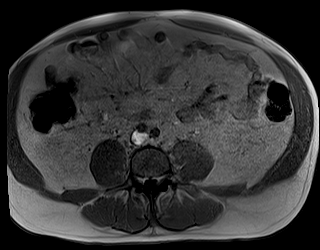
[im 36/72]
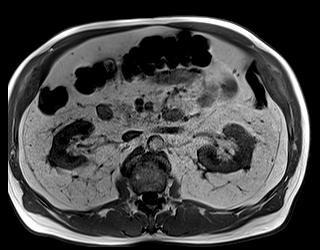
[im 72/72]
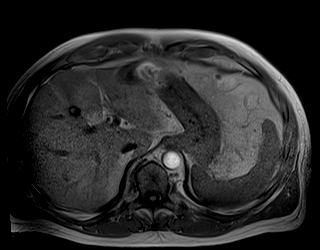

[Series 8: ax in & · axial · 3.0mm · 1.19mm/px · z∈[-140,+73]mm · 3 of 72 slices shown (2 of 2)]
[im 1/72]
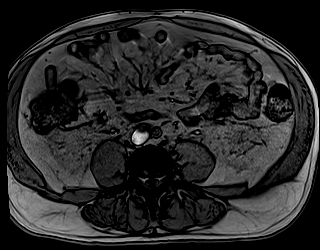
[im 36/72]
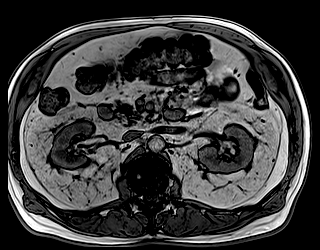
[im 72/72]
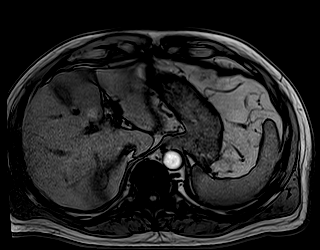

[Series 9: bSSFP · axial · 6.0mm · 0.74mm/px · 1 of 35 slices shown]
[im 1/35]
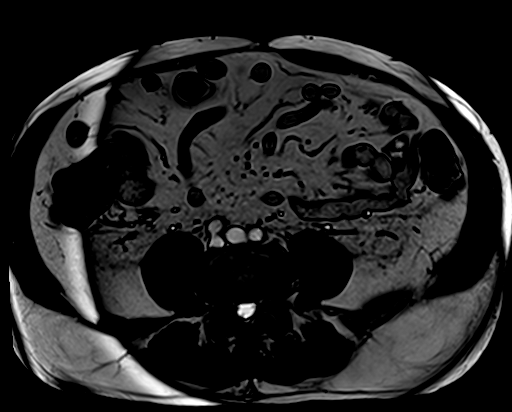

[Series 10: T1 dynamic · axial · non-contrast · 3.0mm · 1.19mm/px · z∈[-152,+85]mm · 3 of 80 slices shown (1 of 9)]
[im 1/80]
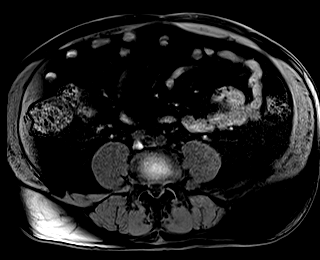
[im 40/80]
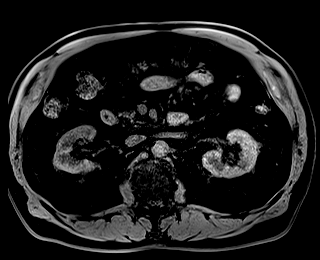
[im 80/80]
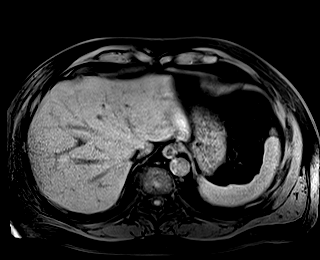

[Series 11: T1 dynamic · axial · 3.0mm · 1.19mm/px · z∈[-152,+85]mm · 3 of 80 slices shown (2 of 9)]
[im 1/80]
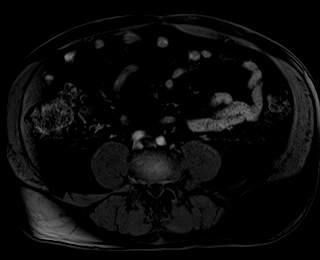
[im 40/80]
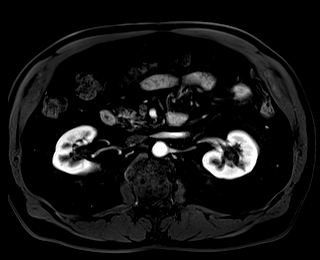
[im 80/80]
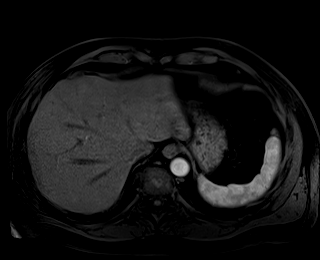

[Series 12: T1 dynamic · axial · 3.0mm · 1.19mm/px · z∈[-152,+85]mm · 3 of 80 slices shown (3 of 9)]
[im 1/80]
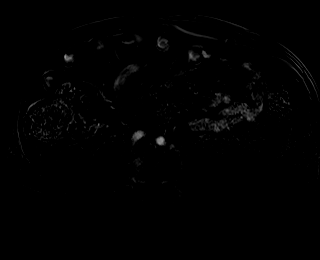
[im 40/80]
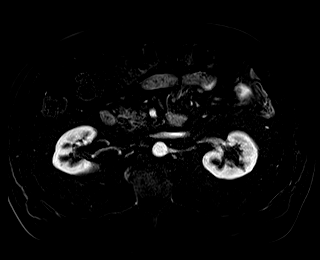
[im 80/80]
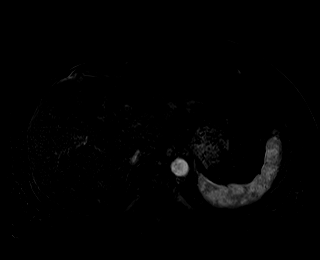

[Series 13: T1 dynamic · axial · 3.0mm · 1.19mm/px · z∈[-152,+85]mm · 3 of 80 slices shown (4 of 9)]
[im 1/80]
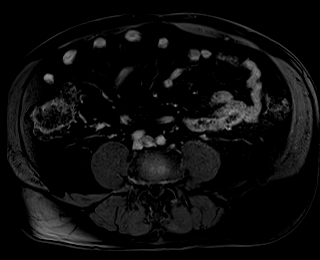
[im 40/80]
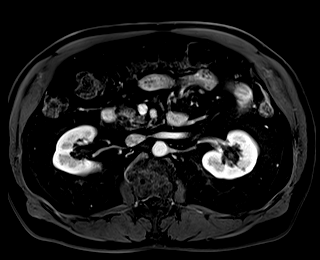
[im 80/80]
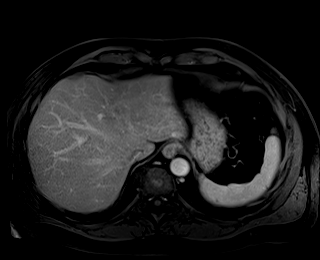

[Series 14: T1 dynamic · axial · 3.0mm · 1.19mm/px · z∈[-152,+85]mm · 3 of 80 slices shown (5 of 9)]
[im 1/80]
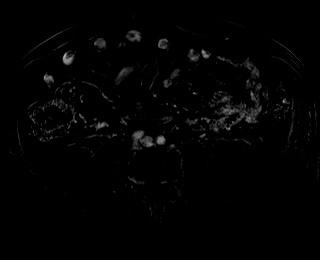
[im 40/80]
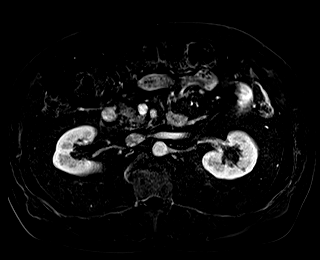
[im 80/80]
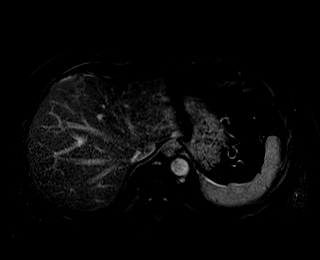

[Series 15: T1 dynamic · axial · 3.0mm · 1.19mm/px · z∈[-152,+85]mm · 3 of 80 slices shown (6 of 9)]
[im 1/80]
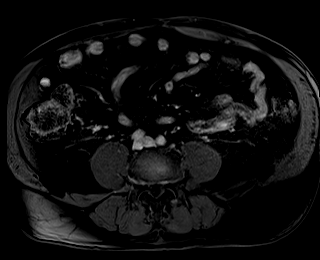
[im 40/80]
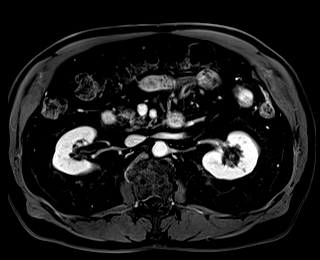
[im 80/80]
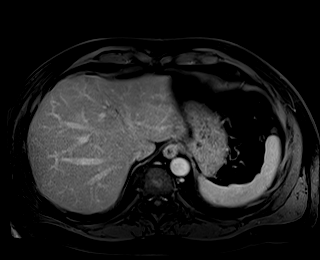

[Series 16: T1 dynamic · axial · 3.0mm · 1.19mm/px · z∈[-152,+85]mm · 3 of 80 slices shown (7 of 9)]
[im 1/80]
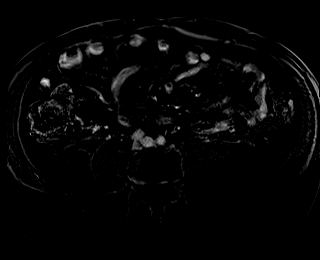
[im 40/80]
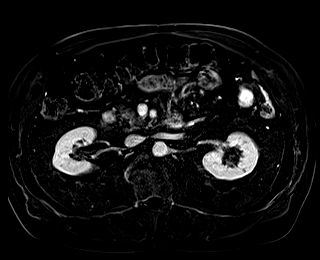
[im 80/80]
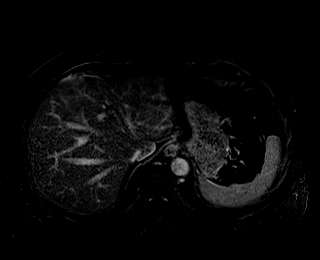

[Series 17: T1 dynamic post-contrast · coronal · 3.0mm · 1.31mm/px · 3 of 80 slices shown]
[im 1/80]
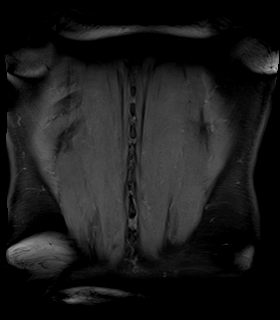
[im 40/80]
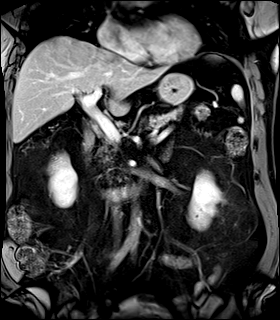
[im 80/80]
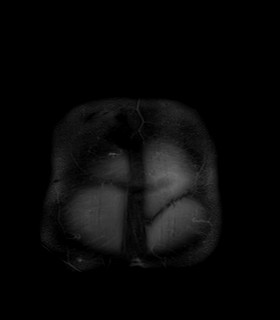

[Series 18: T2 · axial · 6.0mm · 1.19mm/px · 1 of 35 slices shown]
[im 1/35]
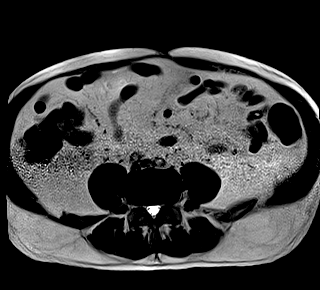

[Series 19: T1 dynamic · axial · 3.0mm · 1.19mm/px · z∈[-152,+85]mm · 3 of 80 slices shown (8 of 9)]
[im 1/80]
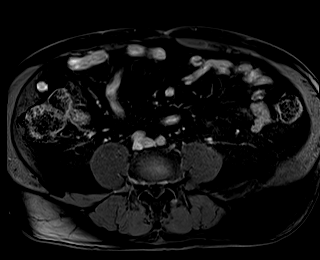
[im 40/80]
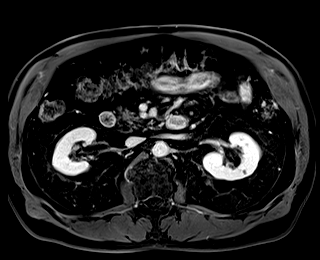
[im 80/80]
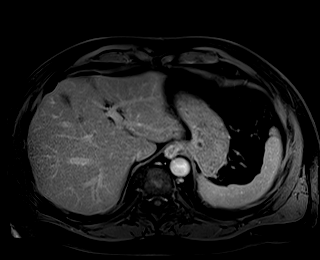

[Series 20: T1 dynamic · axial · 3.0mm · 1.19mm/px · z∈[-152,+85]mm · 3 of 80 slices shown (9 of 9)]
[im 1/80]
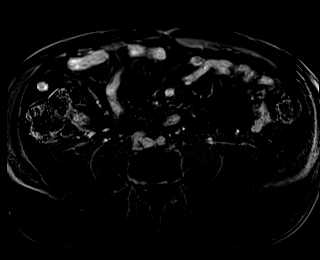
[im 40/80]
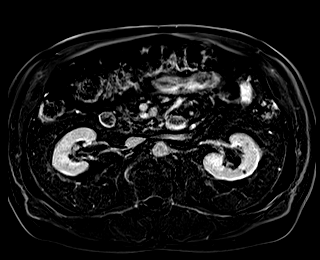
[im 80/80]
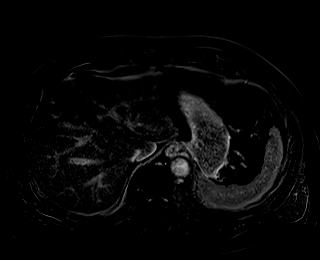

[48 of 48 positions shown; findings below may reference images not displayed]

FINDINGS: Lower chest:  Lung bases are clear.

Hepatobiliary: No focal hepatic lesion. No biliary duct dilatation.
Common bile duct normal caliber. Postcholecystectomy.

Pancreas: Normal pancreatic parenchymal intensity. No ductal
dilatation or inflammation.

Spleen: Normal spleen.

Adrenals/urinary tract: Adrenal glands normal.

Exophytic from the upper pole of the LEFT kidney is a rounded 2.2 cm
lesion which is hyperintense on precontrast T1 weighted imaging
(image [DATE]) which indicates internal blood product. This lesion is
hyperintense on T2 weighted imaging (image [DATE]). This is lesion of
concern on comparison CT. On postcontrast imaging this lesion
demonstrates no appreciable enhancement by direct intensity
measurement and by visual inspection of the subtraction images
(series 11 and series 12). Lesion does have a thin enhancing rim
without nodularity

There are additional nonenhancing cystic lesions within LEFT and
RIGHT kidneys which are 10 mm or less and are hyperintense on T2
weighted imaging consistent with benign cysts.

One additional hemorrhagic cyst is noted exophytic from the lower
pole of the RIGHT kidney measuring 5 mm (image [DATE]).

Stomach/Bowel: Stomach and limited of the small bowel is
unremarkable

Vascular/Lymphatic: Abdominal aortic normal caliber. No
retroperitoneal periportal lymphadenopathy.

Musculoskeletal: Rounded lesion in the L1 vertebral body is
hyperintense on T2 weighted imaging and demonstrates post-contrast
enhancement. Similar lesion in the L3 vertebral body. These endplate
lesions correlate to small Schmorl's nodes on comparison CT in
favored benign
IMPRESSION: 1. Bosniak 2 hemorrhagic renal cyst of the upper pole of the LEFT
kidney corresponds to indeterminate lesion on comparison CT.
Probable benign lesion.
2. Additional Bosniak 2 renal cyst of the lower pole of the RIGHT
kidney.
3. Multiple small Bosniak 1 renal cyst both kidneys.

## 2019-11-01 MED ORDER — GADOBUTROL 1 MMOL/ML IV SOLN
10.0000 mL | Freq: Once | INTRAVENOUS | Status: AC | PRN
  Administered 2019-11-01: 10 mL via INTRAVENOUS

## 2019-11-03 ENCOUNTER — Other Ambulatory Visit: Payer: Self-pay

## 2019-11-03 DIAGNOSIS — N401 Enlarged prostate with lower urinary tract symptoms: Secondary | ICD-10-CM | POA: Diagnosis not present

## 2019-11-03 DIAGNOSIS — N200 Calculus of kidney: Secondary | ICD-10-CM | POA: Diagnosis not present

## 2019-11-03 DIAGNOSIS — N281 Cyst of kidney, acquired: Secondary | ICD-10-CM | POA: Diagnosis not present

## 2019-11-07 ENCOUNTER — Ambulatory Visit: Payer: PPO | Admitting: Endocrinology

## 2019-11-09 DIAGNOSIS — Z20828 Contact with and (suspected) exposure to other viral communicable diseases: Secondary | ICD-10-CM | POA: Diagnosis not present

## 2019-11-14 ENCOUNTER — Ambulatory Visit: Payer: PPO

## 2019-12-12 ENCOUNTER — Other Ambulatory Visit: Payer: Self-pay

## 2019-12-14 ENCOUNTER — Ambulatory Visit (INDEPENDENT_AMBULATORY_CARE_PROVIDER_SITE_OTHER): Payer: PPO | Admitting: Endocrinology

## 2019-12-14 ENCOUNTER — Encounter: Payer: Self-pay | Admitting: Endocrinology

## 2019-12-14 ENCOUNTER — Other Ambulatory Visit: Payer: Self-pay

## 2019-12-14 VITALS — BP 170/82 | HR 70 | Ht 72.0 in | Wt 231.0 lb

## 2019-12-14 DIAGNOSIS — E11319 Type 2 diabetes mellitus with unspecified diabetic retinopathy without macular edema: Secondary | ICD-10-CM | POA: Diagnosis not present

## 2019-12-14 DIAGNOSIS — Z794 Long term (current) use of insulin: Secondary | ICD-10-CM | POA: Diagnosis not present

## 2019-12-14 LAB — POCT GLYCOSYLATED HEMOGLOBIN (HGB A1C): Hemoglobin A1C: 7.1 % — AB (ref 4.0–5.6)

## 2019-12-14 MED ORDER — NOVOLIN 70/30 RELION (70-30) 100 UNIT/ML ~~LOC~~ SUSP: SUBCUTANEOUS | 0 refills | Status: DC

## 2019-12-14 NOTE — Patient Instructions (Addendum)
Your blood pressure is high today.  Please see your primary care provider soon, to have it rechecked Please change the insulin to170 units with breakfast, and 40 units with supper However, if you are going to be active, take just 120 units that morning.    check your blood sugar twice a day.  vary the time of day when you check, between before the 3 meals, and at bedtime.  also check if you have symptoms of your blood sugar being too high or too low.  please keep a record of the readings and bring it to your next appointment here (or you can bring the meter itself).  You can write it on any piece of paper.  please call us sooner if your blood sugar goes below 70, or if you have a lot of readings over 200.   Please come back for a follow-up appointment in 2 months.

## 2019-12-14 NOTE — Progress Notes (Signed)
Subjective:    Patient ID: [name], male    DOB: 1946/04/27, 74 y.o.   MRN: [id number]  HPI Pt returns for f/u of diabetes mellitus: DM type: Insulin-requiring type 2 Dx'ed: Q000111Q Complications: renal insuff and retinopathy.  Therapy: insulin since soon after dx.   DKA: never.  Severe hypoglycemia: never.   Pancreatitis: never.   SDOH: he cannot afford brand name meds Other: he is on a BID insulin schedule, due to noncompliance; he is retired.   Interval history: pt states cbg's vary from 60-300's, but most are in the 100's.  It is in general higher as the day goes on.  He seldom has hypoglycemia, and these episodes are mild.  pt states he feels well in general.  He takes 150 units qam and 50-70 units after the evening meal (depending on cbg).   Past Medical History:  Diagnosis Date  . Adenomatous colon polyp 06/2001  . ALLERGIC RHINITIS 08/03/2007  . Allergy   . Asthma   . BENIGN PROSTATIC HYPERTROPHY 08/03/2007  . Chronic back pain   . COPD (chronic obstructive pulmonary disease) ([place])   . 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 Diabetes mellitus without complication ([place])    x Q000111Q years as of 11/08/18   . DIABETIC RETINOPATHY, BACKGROUND 09/29/2008  . DYSPHAGIA UNSPECIFIED 09/29/2008  . 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 GERD 11/28/2008  . HYPERLIPIDEMIA 08/03/2007  . Kidney stones    Dr. Boneta Lucks  . 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 PAIN IN SOFT TISSUES OF LIMB 01/07/2008  . UNSPECIFIED ANEMIA 12/29/2007  . Unspecified essential hypertension 01/07/2008  . URINARY CALCULUS 09/29/2008  . Urine incontinence     Past Surgical History:  Procedure Laterality Date  . CHOLECYSTECTOMY N/A 11/12/2016   Procedure: LAPAROSCOPIC CHOLECYSTECTOMY WITH INTRAOPERATIVE CHOLANGIOGRAM;  Surgeon: Clayburn Pert, MD;  Location: [facility] ORS;  Service: General;  Laterality: N/A;  . COLONOSCOPY     polyp  . HAND SURGERY Right    2016  . LITHOTRIPSY    . SHOULDER SURGERY  Left    left, cyst and tumor removed  . URINARY SURGERY  2018   Dr Daiva Eves "stretched" urethra around prostate to increase urine flow    Social History   Socioeconomic History  . Marital status: Married    Spouse name: Not on file  . Number of children: 2  . Years of education: Not on file  . Highest education level: Not on file  Occupational History  . Occupation: Metal Fabrication    Employer: [employer]  Tobacco Use  . Smoking status: Never Smoker  . Smokeless tobacco: Never Used  Substance and Sexual Activity  . Alcohol use: Yes    Comment: occasional beer  . Drug use: No  . Sexual activity: Not on file  Other Topics Concern  . 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 Difficulty of Paying Living Expenses: Not hard at all  Food Insecurity: No Food Insecurity  . Worried About Charity fundraiser in the Last Year: Never true  . Ran Out of Food in the Last Year: Never true  Transportation Needs: No Transportation Needs  . Lack of Transportation (Medical): No  .  Lack of Transportation (Non-Medical): No  Physical Activity: Insufficiently Active  . Days of Exercise per Week: 1 day  . Minutes of Exercise per Session: 60 min  Stress: No Stress Concern Present  . Feeling of Stress : Not at all  Social Connections:   . Frequency of Communication with Friends and Family: Not on file  . Frequency of Social Gatherings with Friends and Family: Not on file  . Attends Religious Services: Not on file  . Active Member of Clubs or Organizations: Not on file  . Attends Archivist Meetings: Not on file  . Marital Status: Not on file  Intimate Partner Violence: Not At Risk  . Fear of Current or Ex-Partner: No  . Emotionally Abused: No  . Physically Abused: No  . Sexually  Abused: No    Current Outpatient Medications on File Prior to Visit  Medication Sig Dispense Refill  . amLODipine (NORVASC) 2.5 MG tablet Take 1 tablet (2.5 mg total) by mouth daily. 90 tablet 3  . aspirin 81 MG tablet Take 81 mg by mouth daily.    Marland Kitchen atorvastatin (LIPITOR) 20 MG tablet Take 20 mg by mouth daily at 6 PM.    . cyanocobalamin (,VITAMIN B-12,) 1000 MCG/ML injection Inject 1 mL (1,000 mcg total) into the muscle every 30 (thirty) days. 4 mL 12  . Dutasteride-Tamsulosin HCl 0.5-0.4 MG CAPS Take 0.4 mg by mouth daily.    . Insulin Syringe-Needle U-100 (RELION INSULIN SYR 1CC/30G) 30G X 5/16" 1 ML MISC 1 each by Does not apply route 2 (two) times a day. Use to inject insulin BID; E11.9 100 each 2  . Lancets (ONETOUCH ULTRASOFT) lancets Use as instructed 2x daily Dx: 250.00 100 each 5  . losartan-hydrochlorothiazide (HYZAAR) 100-25 MG tablet Take 1 tablet by mouth daily.    Marland Kitchen MAGNESIUM PO Take 1 tablet by mouth daily.     . metoprolol succinate (TOPROL-XL) 25 MG 24 hr tablet Take 1 tablet (25 mg total) by mouth daily. 90 tablet 3  . mupirocin ointment (BACTROBAN) 2 % Apply 1 application topically daily. Apply thin layer to affected area on right big toe 22 g 0  . NON FORMULARY Prostate Peanut from [place].    Marland Kitchen omeprazole (PRILOSEC) 40 MG capsule Take 1 capsule (40 mg total) by mouth daily. 90 capsule 3  . ONE TOUCH ULTRA TEST test strip TEST TWICE DAILY 100 each 9  . tamsulosin (FLOMAX) 0.4 MG CAPS capsule Take 1 capsule by mouth daily.    . traZODone (DESYREL) 50 MG tablet Take 1 tablet (50 mg total) by mouth at bedtime. 90 tablet 2  . vitamin E 400 UNIT capsule Take 400 Units by mouth daily.     No current facility-administered medications on file prior to visit.    Allergies  Allergen Reactions  . Sulfamethoxazole-Trimethoprim Rash    Family History  Problem Relation Age of Onset  . Diabetes Mother   . Depression Mother   . Prostate cancer Brother   . Diabetes  Brother   . Heart disease Brother   . Diabetes Brother   . Colon cancer Neg Hx   . Esophageal cancer Neg Hx   . Rectal cancer Neg Hx   . Stomach cancer Neg Hx     BP (!) 170/82 (BP Location: Left Arm, Patient Position: Sitting, Cuff Size: Normal)   Pulse 70   Ht 6' (1.829 m)   Wt 231 lb (104.8 kg)   SpO2 98%  BMI 31.33 kg/m   Review of Systems Denies LOC    Objective:   Physical Exam VITAL SIGNS:  See vs page GENERAL: no distress Pulses: dorsalis pedis intact bilat.   MSK: no deformity of the feet CV: no leg edema Skin:  no ulcer on the feet.  normal color and temp on the feet. Neuro: sensation is intact to touch on the feet, but decreased from normal.    Lab Results  Component Value Date   HGBA1C 7.1 (A) 12/14/2019       Assessment & Plan:  Insulin-requiring type 2 DM, with DR: well-controlled Hypoglycemia: this limits aggressiveness of glycemic control Noncompliance with insulin timing: We discussed anticipatory dosing HTN: is noted today   Patient Instructions  Your blood pressure is high today.  Please see your primary care provider soon, to have it rechecked Please change the insulin to170 units with breakfast, and 40 units with supper However, if you are going to be active, take just 120 units that morning.    check your blood sugar twice a day.  vary the time of day when you check, between before the 3 meals, and at bedtime.  also check if you have symptoms of your blood sugar being too high or too low.  please keep a record of the readings and bring it to your next appointment here (or you can bring the meter itself).  You can write it on any piece of paper.  please call us sooner if your blood sugar goes below 70, or if you have a lot of readings over 200.   Please come back for a follow-up appointment in 2 months.

## 2019-12-19 ENCOUNTER — Telehealth: Payer: Self-pay | Admitting: Internal Medicine

## 2019-12-19 NOTE — Telephone Encounter (Signed)
Blood pressure is high  -is he agreeable to increase amlodipine/norvasc from 2.5 to 5 mg daily and keep f/u with me in 12/2019?  Call back if blood pressure >130/>80 2 hours after taking medication before next appt?    [name]

## 2019-12-19 NOTE — Telephone Encounter (Signed)
Patients wife was informed.  Patients wife understood and no questions, comments, or concerns at this time. Husband will call tomorrow.

## 2020-01-01 ENCOUNTER — Other Ambulatory Visit: Payer: Self-pay | Admitting: Endocrinology

## 2020-01-01 DIAGNOSIS — Z794 Long term (current) use of insulin: Secondary | ICD-10-CM

## 2020-01-01 DIAGNOSIS — E11319 Type 2 diabetes mellitus with unspecified diabetic retinopathy without macular edema: Secondary | ICD-10-CM

## 2020-01-05 DIAGNOSIS — Z23 Encounter for immunization: Secondary | ICD-10-CM | POA: Diagnosis not present

## 2020-01-18 ENCOUNTER — Other Ambulatory Visit: Payer: Self-pay | Admitting: Endocrinology

## 2020-01-19 ENCOUNTER — Other Ambulatory Visit: Payer: Self-pay

## 2020-01-20 ENCOUNTER — Other Ambulatory Visit: Payer: Self-pay

## 2020-01-20 ENCOUNTER — Ambulatory Visit (INDEPENDENT_AMBULATORY_CARE_PROVIDER_SITE_OTHER): Payer: PPO | Admitting: Internal Medicine

## 2020-01-20 ENCOUNTER — Ambulatory Visit (INDEPENDENT_AMBULATORY_CARE_PROVIDER_SITE_OTHER): Payer: PPO

## 2020-01-20 ENCOUNTER — Encounter: Payer: Self-pay | Admitting: Internal Medicine

## 2020-01-20 VITALS — BP 118/70 | HR 75 | Temp 97.5°F | Ht 72.0 in | Wt 229.0 lb

## 2020-01-20 DIAGNOSIS — I10 Essential (primary) hypertension: Secondary | ICD-10-CM

## 2020-01-20 DIAGNOSIS — M25511 Pain in right shoulder: Secondary | ICD-10-CM

## 2020-01-20 DIAGNOSIS — E538 Deficiency of other specified B group vitamins: Secondary | ICD-10-CM | POA: Diagnosis not present

## 2020-01-20 DIAGNOSIS — J439 Emphysema, unspecified: Secondary | ICD-10-CM

## 2020-01-20 DIAGNOSIS — E119 Type 2 diabetes mellitus without complications: Secondary | ICD-10-CM | POA: Diagnosis not present

## 2020-01-20 DIAGNOSIS — M19011 Primary osteoarthritis, right shoulder: Secondary | ICD-10-CM | POA: Diagnosis not present

## 2020-01-20 DIAGNOSIS — Z1329 Encounter for screening for other suspected endocrine disorder: Secondary | ICD-10-CM

## 2020-01-20 DIAGNOSIS — E559 Vitamin D deficiency, unspecified: Secondary | ICD-10-CM | POA: Diagnosis not present

## 2020-01-20 DIAGNOSIS — J309 Allergic rhinitis, unspecified: Secondary | ICD-10-CM | POA: Diagnosis not present

## 2020-01-20 DIAGNOSIS — Z794 Long term (current) use of insulin: Secondary | ICD-10-CM

## 2020-01-20 DIAGNOSIS — N281 Cyst of kidney, acquired: Secondary | ICD-10-CM

## 2020-01-20 IMAGING — DX DG SHOULDER 2+V*R*
3 series · 3 of 3 positions shown · non-contrast
Comparison: None.

CLINICAL DATA: Right shoulder pain

EXAM:
RIGHT SHOULDER - 2+ VIEW

[shoulder (grashey) ap]
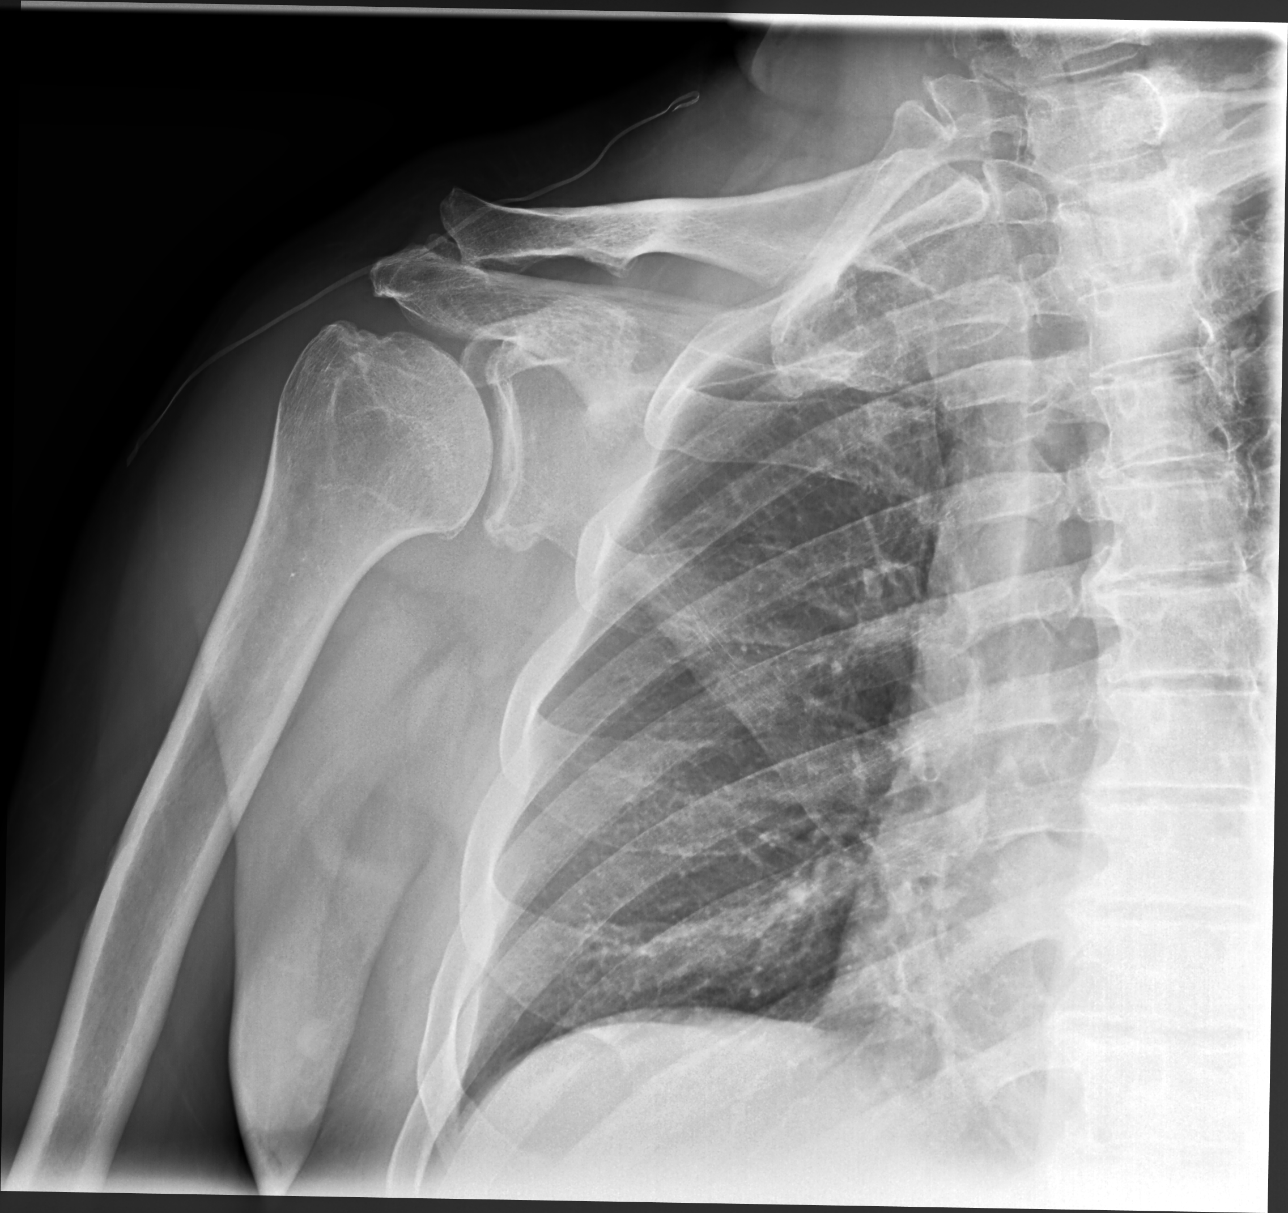

[shoulder y view]
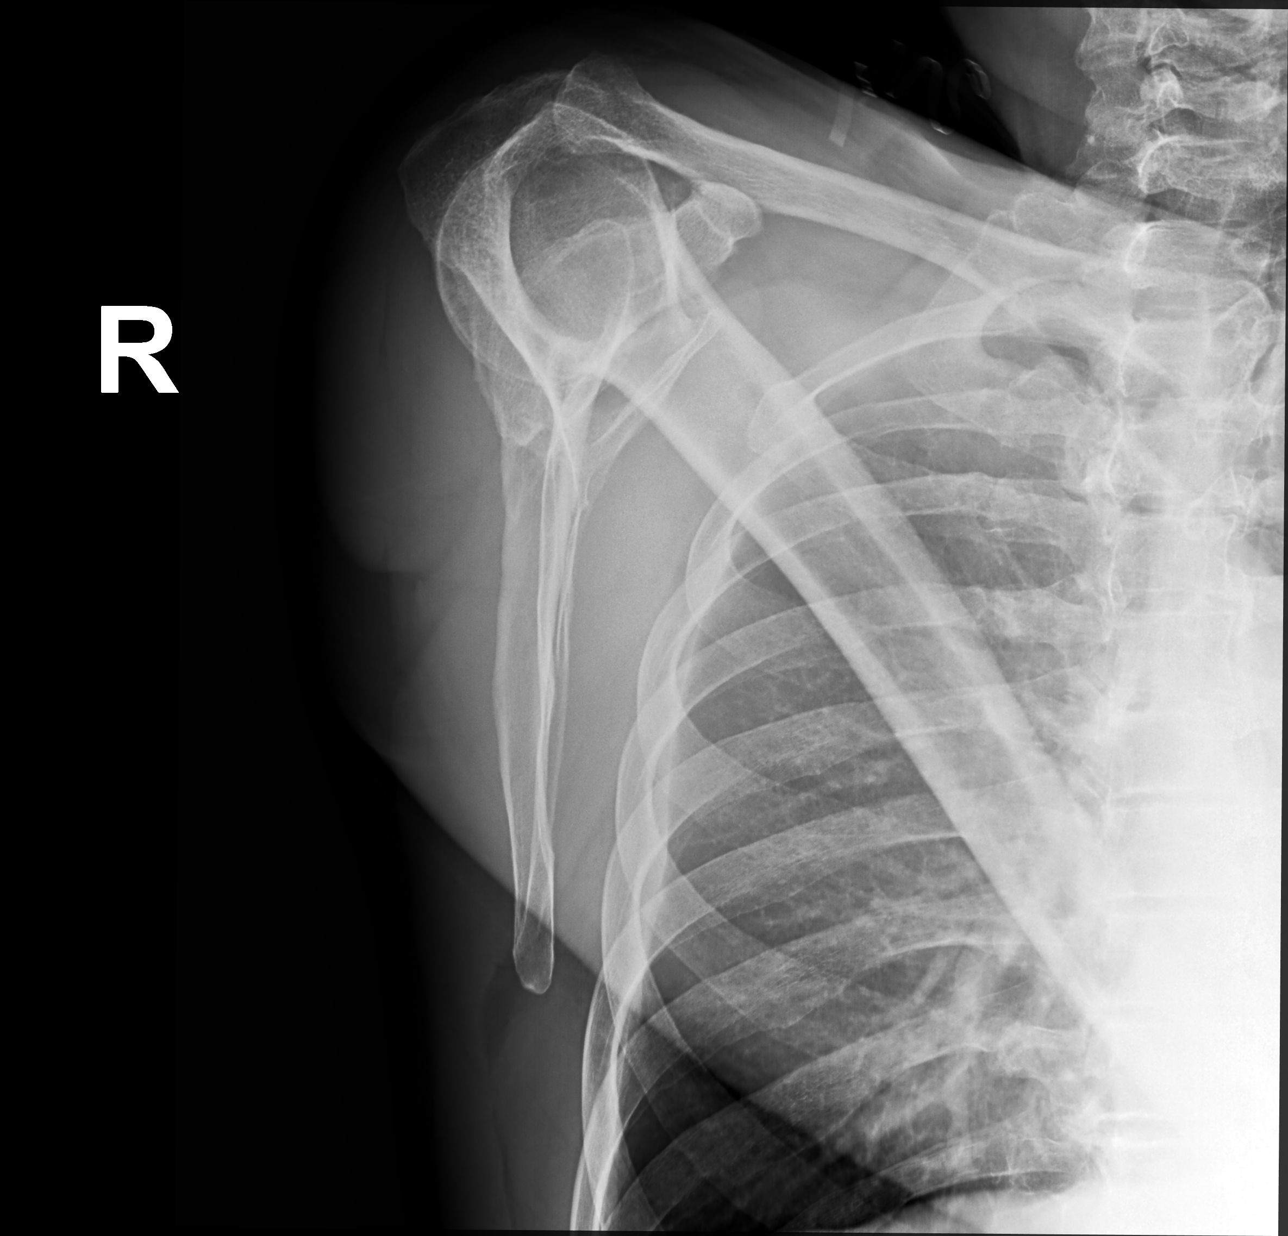

[shoulder internal rotation ap]
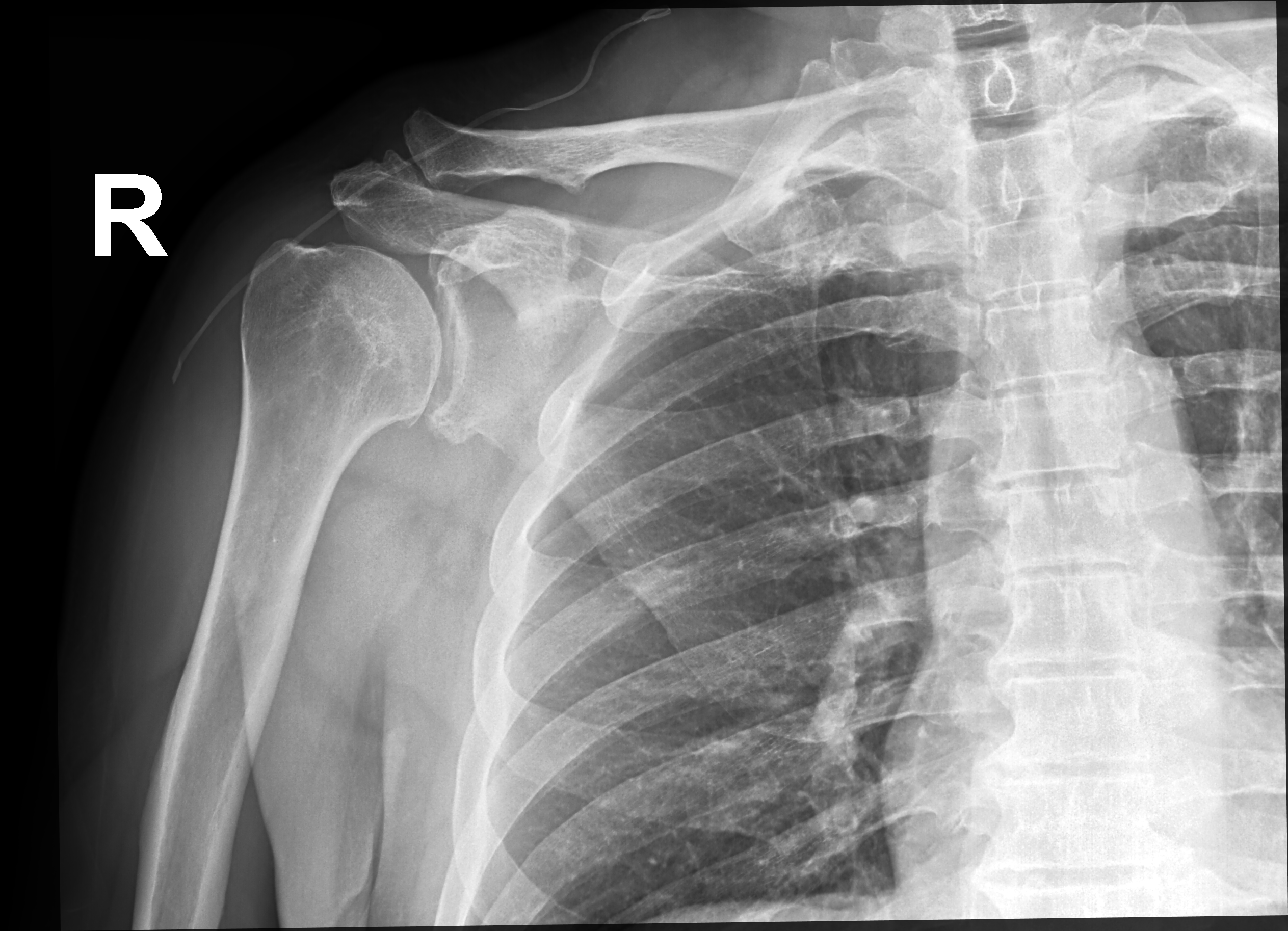

[3 of 3 positions shown; findings below may reference images not displayed]

FINDINGS: No fracture or dislocation of the right shoulder. There is moderate
acromioclavicular and glenohumeral arthrosis. The partially imaged
right chest is unremarkable.
IMPRESSION: No fracture or dislocation of the right shoulder. There is moderate
acromioclavicular and glenohumeral arthrosis.

## 2020-01-20 MED ORDER — DICLOFENAC SODIUM 1 % EX GEL: 2.0000 g | Freq: Four times a day (QID) | CUTANEOUS | 11 refills | Status: DC

## 2020-01-20 MED ORDER — ALBUTEROL SULFATE HFA 108 (90 BASE) MCG/ACT IN AERS: 1.0000 | INHALATION_SPRAY | Freq: Four times a day (QID) | RESPIRATORY_TRACT | 11 refills | Status: DC | PRN

## 2020-01-20 MED ORDER — FLUTICASONE PROPIONATE 50 MCG/ACT NA SUSP: 2.0000 | Freq: Every day | NASAL | 11 refills | Status: DC

## 2020-01-20 MED ORDER — SALINE SPRAY 0.65 % NA SOLN: 1.0000 | Freq: Every day | NASAL | 11 refills | Status: DC | PRN

## 2020-01-20 NOTE — Patient Instructions (Addendum)
Claritin, allegra, zyrtec, xyzal over the counter  Nasal saline, flonase  Albuterol inhaler   Dr. Mack Guise [facility]  Dr. Onnie [facility] in [place]  Dr. Maureen Ralphs  Dr. Roland Rack [facility])   COPD and Physical Activity Chronic obstructive pulmonary disease (COPD) is a long-term (chronic) condition that affects the lungs. COPD is a general term that can be used to describe many different lung problems that cause lung swelling (inflammation) and limit airflow, including chronic bronchitis and emphysema. The main symptom of COPD is shortness of breath, which makes it harder to do even simple tasks. This can also make it harder to exercise and be active. Talk with your health care provider about treatments to help you breathe better and actions you can take to prevent breathing problems during physical activity. What are the benefits of exercising with COPD? Exercising regularly is an important part of a healthy lifestyle. You can still exercise and do physical activities even though you have COPD. Exercise and physical activity improve your shortness of breath by increasing blood flow (circulation). This causes your heart to pump more oxygen through your body. Moderate exercise can improve your:  Oxygen use.  Energy level.  Shortness of breath.  Strength in your breathing muscles.  Heart health.  Sleep.  Self-esteem and feelings of self-worth.  Depression, stress, and anxiety levels. Exercise can benefit everyone with COPD. The severity of your disease may affect how hard you can exercise, especially at first, but everyone can benefit. Talk with your health care provider about how much exercise is safe for you, and which activities and exercises are safe for you. What actions can I take to prevent breathing problems during physical activity?  Sign up for a pulmonary rehabilitation program. This type of program may include: ? Education about lung diseases. ? Exercise classes that  teach you how to exercise and be more active while improving your breathing. This usually involves:  Exercise using your lower extremities, such as a stationary bicycle.  About 30 minutes of exercise, 2 to 5 times per week, for 6 to 12 weeks  Strength training, such as push ups or leg lifts. ? Nutrition education. ? Group classes in which you can talk with others who also have COPD and learn ways to manage stress.  If you use an oxygen tank, you should use it while you exercise. Work with your health care provider to adjust your oxygen for your physical activity. Your resting flow rate is different from your flow rate during physical activity.  While you are exercising: ? Take slow breaths. ? Pace yourself and do not try to go too fast. ? Purse your lips while breathing out. Pursing your lips is similar to a kissing or whistling position. ? If doing exercise that uses a quick burst of effort, such as weight lifting:  Breathe in before starting the exercise.  Breathe out during the hardest part of the exercise (such as raising the weights). Where to find support You can find support for exercising with COPD from:  Your health care provider.  A pulmonary rehabilitation program.  Your local health department or community health programs.  Support groups, online or in-person. Your health care provider may be able to recommend support groups. Where to find more information You can find more information about exercising with COPD from:  American Lung Association: [employer]Ins[employer].se.  COPD Foundation: https://www.rivera.net/. Contact a health care provider if:  Your symptoms get worse.  You have chest pain.  You have  nausea.  You have a fever.  You have trouble talking or catching your breath.  You want to start a new exercise program or a new activity. Summary  COPD is a general term that can be used to describe many different lung problems that cause lung swelling (inflammation) and  limit airflow. This includes chronic bronchitis and emphysema.  Exercise and physical activity improve your shortness of breath by increasing blood flow (circulation). This causes your heart to provide more oxygen to your body.  Contact your health care provider before starting any exercise program or new activity. Ask your health care provider what exercises and activities are safe for you. This information is not intended to replace advice given to you by your health care provider. Make sure you discuss any questions you have with your health care provider. Document Revised: 03/02/2019 Document Reviewed: 12/03/2017 Elsevier Patient Education  2020 [employer].  Allergic Rhinitis, Adult Allergic rhinitis is an allergic reaction that affects the mucous membrane inside the nose. It causes sneezing, a runny or stuffy nose, and the feeling of mucus going down the back of the throat (postnasal drip). Allergic rhinitis can be mild to severe. There are two types of allergic rhinitis:  Seasonal. This type is also called hay fever. It happens only during certain seasons.  Perennial. This type can happen at any time of the year. What are the causes? This condition happens when the body's defense system (immune system) responds to certain harmless substances called allergens as though they were germs.  Seasonal allergic rhinitis is triggered by pollen, which can come from grasses, trees, and weeds. Perennial allergic rhinitis may be caused by:  House dust mites.  Pet dander.  Mold spores. What are the signs or symptoms? Symptoms of this condition include:  Sneezing.  Runny or stuffy nose (nasal congestion).  Postnasal drip.  Itchy nose.  Tearing of the eyes.  Trouble sleeping.  Daytime sleepiness. How is this diagnosed? This condition may be diagnosed based on:  Your medical history.  A physical exam.  Tests to check for related conditions, such as: ? Asthma. ? Pink  eye. ? Ear infection. ? Upper respiratory infection.  Tests to find out which allergens trigger your symptoms. These may include skin or blood tests. How is this treated? There is no cure for this condition, but treatment can help control symptoms. Treatment may include:  Taking medicines that block allergy symptoms, such as antihistamines. Medicine may be given as a shot, nasal spray, or pill.  Avoiding the allergen.  Desensitization. This treatment involves getting ongoing shots until your body becomes less sensitive to the allergen. This treatment may be done if other treatments do not help.  If taking medicine and avoiding the allergen does not work, new, stronger medicines may be prescribed. Follow these instructions at home:  Find out what you are allergic to. Common allergens include smoke, dust, and pollen.  Avoid the things you are allergic to. These are some things you can do to help avoid allergens: ? Replace carpet with wood, tile, or vinyl flooring. Carpet can trap dander and dust. ? Do not smoke. Do not allow smoking in your home. ? Change your heating and air conditioning filter at least once a month. ? During allergy season:  Keep windows closed as much as possible.  Plan outdoor activities when pollen counts are lowest. This is usually during the evening hours.  When coming indoors, change clothing and shower before sitting on furniture or bedding.  Take over-the-counter and prescription medicines only as told by your health care provider.  Keep all follow-up visits as told by your health care provider. This is important. Contact a health care provider if:  You have a fever.  You develop a persistent cough.  You make whistling sounds when you breathe (you wheeze).  Your symptoms interfere with your normal daily activities. Get help right away if:  You have shortness of breath. Summary  This condition can be managed by taking medicines as directed and  avoiding allergens.  Contact your health care provider if you develop a persistent cough or fever.  During allergy season, keep windows closed as much as possible. This information is not intended to replace advice given to you by your health care provider. Make sure you discuss any questions you have with your health care provider. Document Revised: 10/23/2017 Document Reviewed: 12/18/2016 Elsevier Patient Education  [place].  Shoulder Range of Motion Exercises Shoulder range of motion (ROM) exercises are done to keep the shoulder moving freely or to increase movement. They are often recommended for people who have shoulder pain or stiffness or who are recovering from a shoulder surgery. Phase 1 exercises When you are able, do this exercise 1-2 times per day for 30-60 seconds in each direction, or as directed by your health care provider. Pendulum exercise To do this exercise while sitting: 1. Sit in a chair or at the edge of your bed with your feet flat on the floor. 2. Let your affected arm hang down in front of you over the edge of the bed or chair. 3. Relax your shoulder, arm, and hand. [place] your body so your arm gently swings in small circles. You can also use your unaffected arm to start the motion. 5. Repeat changing the direction of the circles, swinging your arm left and right, and swinging your arm forward and back. To do this exercise while standing: 1. Stand next to a sturdy chair or table, and hold on to it with your hand on your unaffected side. 2. Bend forward at the waist. 3. Bend your knees slightly. 4. Relax your shoulder, arm, and hand. 5. While keeping your shoulder relaxed, use body motion to swing your arm in small circles. 6. Repeat changing the direction of the circles, swinging your arm left and right, and swinging your arm forward and back. 7. Between exercises, stand up tall and take a short break to relax your lower back.  Phase 2 exercises Do  these exercises 1-2 times per day or as told by your health care provider. Hold each stretch for 30 seconds, and repeat 3 times. Do the exercises with one or both arms as instructed by your health care provider. For these exercises, sit at a table with your hand and arm supported by the table. A chair that slides easily or has wheels can be helpful. External rotation 1. Turn your chair so that your affected side is nearest to the table. 2. Place your forearm on the table to your side. Bend your elbow about 90 at the elbow (right angle) and place your hand palm facing down on the table. Your elbow should be about 6 inches away from your side. 3. Keeping your arm on the table, lean your body forward. Abduction 1. Turn your chair so that your affected side is nearest to the table. 2. Place your forearm and hand on the table so that your thumb points toward the ceiling and your arm  is straight out to your side. 3. Slide your hand out to the side and away from you, using your unaffected arm to do the work. 4. To increase the stretch, you can slide your chair away from the table. Flexion: forward stretch 1. Sit facing the table. Place your hand and elbow on the table in front of you. 2. Slide your hand forward and away from you, using your unaffected arm to do the work. 3. To increase the stretch, you can slide your chair backward. Phase 3 exercises Do these exercises 1-2 times per day or as told by your health care provider. Hold each stretch for 30 seconds, and repeat 3 times. Do the exercises with one or both arms as instructed by your health care provider. Cross-body stretch: posterior capsule stretch 1. Lift your arm straight out in front of you. 2. Bend your arm 90 at the elbow (right angle) so your forearm moves across your body. 3. Use your other arm to gently pull the elbow across your body, toward your other shoulder. Wall climbs 1. Stand with your affected arm extended out to the side  with your hand resting on a door frame. 2. Slide your hand slowly up the door frame. 3. To increase the stretch, step through the door frame. Keep your body upright and do not lean. Wand exercises You will need a cane, a piece of PVC pipe, or a sturdy wooden dowel for wand exercises. Flexion To do this exercise while standing: 1. Hold the wand with both of your hands, palms down. 2. Using the other arm to help, lift your arms up and over your head, if able. 3. Push upward with your other arm to gently increase the stretch. To do this exercise while lying down: 1. Lie on your back with your elbows resting on the floor and the wand in both your hands. Your hands will be palm down, or pointing toward your feet. 2. Lift your hands toward the ceiling, using your unaffected arm to help if needed. 3. Bring your arms overhead as able, using your unaffected arm to help if needed. Internal rotation 1. Stand while holding the wand behind you with both hands. Your unaffected arm should be extended above your head with the arm of the affected side extended behind you at the level of your waist. The wand should be pointing straight up and down as you hold it. 2. Slowly pull the wand up behind your back by straightening the elbow of your unaffected arm and bending the elbow of your affected arm. External rotation 1. Lie on your back with your affected upper arm supported on a small pillow or rolled towel. When you first do this exercise, keep your upper arm close to your body. Over time, bring your arm up to a 90 angle out to the side. 2. Hold the wand across your stomach and with both hands palm up. Your elbow on your affected side should be bent at a 90 angle. 3. Use your unaffected side to help push your forearm away from you and toward the floor. Keep your elbow on your affected side bent at a 90 angle. Contact a health care provider if you have:  New or increasing pain.  New numbness, tingling,  weakness, or discoloration in your arm or hand. This information is not intended to replace advice given to you by your health care provider. Make sure you discuss any questions you have with your health care provider. Document Revised: 12/23/2017  Document Reviewed: 12/23/2017 Elsevier Patient Education  [place].    Shoulder Exercises Ask your health care provider which exercises are safe for you. Do exercises exactly as told by your health care provider and adjust them as directed. It is normal to feel mild stretching, pulling, tightness, or discomfort as you do these exercises. Stop right away if you feel sudden pain or your pain gets worse. Do not begin these exercises until told by your health care provider. Stretching exercises External rotation and abduction This exercise is sometimes called corner stretch. This exercise rotates your arm outward (external rotation) and moves your arm out from your body (abduction). 1. Stand in a doorway with one of your feet slightly in front of the other. This is called a staggered stance. If you cannot reach your forearms to the door frame, stand facing a corner of a room. 2. Choose one of the following positions as told by your health care provider: ? Place your hands and forearms on the door frame above your head. ? Place your hands and forearms on the door frame at the height of your head. ? Place your hands on the door frame at the height of your elbows. 3. Slowly move your weight onto your front foot until you feel a stretch across your chest and in the front of your shoulders. Keep your head and chest upright and keep your abdominal muscles tight. 4. Hold for __________ seconds. 5. To release the stretch, shift your weight to your back foot. Repeat __________ times. Complete this exercise __________ times a day. Extension, standing 1. Stand and hold a broomstick, a cane, or a similar object behind your back. ? Your hands should be a  little wider than shoulder width apart. ? Your palms should face away from your back. 2. Keeping your elbows straight and your shoulder muscles relaxed, move the stick away from your body until you feel a stretch in your shoulders (extension). ? Avoid shrugging your shoulders while you move the stick. Keep your shoulder blades tucked down toward the middle of your back. 3. Hold for __________ seconds. 4. Slowly return to the starting position. Repeat __________ times. Complete this exercise __________ times a day. Range-of-motion exercises Pendulum  1. Stand near a wall or a surface that you can hold onto for balance. 2. Bend at the waist and let your left / right arm hang straight down. Use your other arm to support you. Keep your back straight and do not lock your knees. 3. Relax your left / right arm and shoulder muscles, and move your hips and your trunk so your left / right arm swings freely. Your arm should swing because of the motion of your body, not because you are using your arm or shoulder muscles. 4. Keep moving your hips and trunk so your arm swings in the following directions, as told by your health care provider: ? Side to side. ? Forward and backward. ? In clockwise and counterclockwise circles. 5. Continue each motion for __________ seconds, or for as long as told by your health care provider. 6. Slowly return to the starting position. Repeat __________ times. Complete this exercise __________ times a day. Shoulder flexion, standing  1. Stand and hold a broomstick, a cane, or a similar object. Place your hands a little more than shoulder width apart on the object. Your left / right hand should be palm up, and your other hand should be palm down. 2. Keep your elbow straight and  your shoulder muscles relaxed. Push the stick up with your healthy arm to raise your left / right arm in front of your body, and then over your head until you feel a stretch in your shoulder  (flexion). ? Avoid shrugging your shoulder while you raise your arm. Keep your shoulder blade tucked down toward the middle of your back. 3. Hold for __________ seconds. 4. Slowly return to the starting position. Repeat __________ times. Complete this exercise __________ times a day. Shoulder abduction, standing 1. Stand and hold a broomstick, a cane, or a similar object. Place your hands a little more than shoulder width apart on the object. Your left / right hand should be palm up, and your other hand should be palm down. 2. Keep your elbow straight and your shoulder muscles relaxed. Push the object across your body toward your left / right side. Raise your left / right arm to the side of your body (abduction) until you feel a stretch in your shoulder. ? Do not raise your arm above shoulder height unless your health care provider tells you to do that. ? If directed, raise your arm over your head. ? Avoid shrugging your shoulder while you raise your arm. Keep your shoulder blade tucked down toward the middle of your back. 3. Hold for __________ seconds. 4. Slowly return to the starting position. Repeat __________ times. Complete this exercise __________ times a day. Internal rotation  1. Place your left / right hand behind your back, palm up. 2. Use your other hand to dangle an exercise band, a towel, or a similar object over your shoulder. Grasp the band with your left / right hand so you are holding on to both ends. 3. Gently pull up on the band until you feel a stretch in the front of your left / right shoulder. The movement of your arm toward the center of your body is called internal rotation. ? Avoid shrugging your shoulder while you raise your arm. Keep your shoulder blade tucked down toward the middle of your back. 4. Hold for __________ seconds. 5. Release the stretch by letting go of the band and lowering your hands. Repeat __________ times. Complete this exercise __________ times a  day. Strengthening exercises External rotation  1. Sit in a stable chair without armrests. 2. Secure an exercise band to a stable object at elbow height on your left / right side. 3. Place a soft object, such as a folded towel or a small pillow, between your left / right upper arm and your body to move your elbow about 4 inches (10 cm) away from your side. 4. Hold the end of the exercise band so it is tight and there is no slack. 5. Keeping your elbow pressed against the soft object, slowly move your forearm out, away from your abdomen (external rotation). Keep your body steady so only your forearm moves. 6. Hold for __________ seconds. 7. Slowly return to the starting position. Repeat __________ times. Complete this exercise __________ times a day. Shoulder abduction  1. Sit in a stable chair without armrests, or stand up. 2. Hold a __________ weight in your left / right hand, or hold an exercise band with both hands. 3. Start with your arms straight down and your left / right palm facing in, toward your body. 4. Slowly lift your left / right hand out to your side (abduction). Do not lift your hand above shoulder height unless your health care provider tells you that this is  safe. ? Keep your arms straight. ? Avoid shrugging your shoulder while you do this movement. Keep your shoulder blade tucked down toward the middle of your back. 5. Hold for __________ seconds. 6. Slowly lower your arm, and return to the starting position. Repeat __________ times. Complete this exercise __________ times a day. Shoulder extension 1. Sit in a stable chair without armrests, or stand up. 2. Secure an exercise band to a stable object in front of you so it is at shoulder height. 3. Hold one end of the exercise band in each hand. Your palms should face each other. 4. Straighten your elbows and lift your hands up to shoulder height. 5. Step back, away from the secured end of the exercise band, until the band  is tight and there is no slack. 6. Squeeze your shoulder blades together as you pull your hands down to the sides of your thighs (extension). Stop when your hands are straight down by your sides. Do not let your hands go behind your body. 7. Hold for __________ seconds. 8. Slowly return to the starting position. Repeat __________ times. Complete this exercise __________ times a day. Shoulder row 1. Sit in a stable chair without armrests, or stand up. 2. Secure an exercise band to a stable object in front of you so it is at waist height. 3. Hold one end of the exercise band in each hand. Position your palms so that your thumbs are facing the ceiling (neutral position). 4. Bend each of your elbows to a 90-degree angle (right angle) and keep your upper arms at your sides. 5. Step back until the band is tight and there is no slack. 6. Slowly pull your elbows back behind you. 7. Hold for __________ seconds. 8. Slowly return to the starting position. Repeat __________ times. Complete this exercise __________ times a day. Shoulder press-ups  1. Sit in a stable chair that has armrests. Sit upright, with your feet flat on the floor. 2. Put your hands on the armrests so your elbows are bent and your fingers are pointing forward. Your hands should be about even with the sides of your body. 3. Push down on the armrests and use your arms to lift yourself off the chair. Straighten your elbows and lift yourself up as much as you comfortably can. ? Move your shoulder blades down, and avoid letting your shoulders move up toward your ears. ? Keep your feet on the ground. As you get stronger, your feet should support less of your body weight as you lift yourself up. 4. Hold for __________ seconds. 5. Slowly lower yourself back into the chair. Repeat __________ times. Complete this exercise __________ times a day. Wall push-ups  1. Stand so you are facing a stable wall. Your feet should be about one  arm-length away from the wall. 2. Lean forward and place your palms on the wall at shoulder height. 3. Keep your feet flat on the floor as you bend your elbows and lean forward toward the wall. 4. Hold for __________ seconds. 5. Straighten your elbows to push yourself back to the starting position. Repeat __________ times. Complete this exercise __________ times a day. This information is not intended to replace advice given to you by your health care provider. Make sure you discuss any questions you have with your health care provider. Document Revised: 03/04/2019 Document Reviewed: 12/10/2018 Elsevier Patient Education  [place].

## 2020-01-20 NOTE — Progress Notes (Signed)
Chief Complaint  Patient presents with  . Shoulder Pain    5/10 in right shoulder. Has a history of cyst and bone spurs in the left shoulder. Thinks it may be the same thing. NKI   . Nasal Congestion    only at night time   F/u  1. Right shoulder pain h/o cyst/spur in left shoulder and this feels the same 5/10 pain worse with overhead movements or reaching outward and back pain x months and he had surgery with a surgeon with [facility] years ago agreeable to do Xray today He does have limited ROM  2. HTN BP more controlled on norvasc 2.5 mg qd, hyzaar 100-25 mg qd, toprol 25 mg qd BP running 120s/60s  3 .B12 def no injection during the pandemic has not been in 4. Allergies h/o allergies shots in the psat c/o nasal congestoin at night  5. DM 2 f/u endocrine Dr. Loanne Drilling 12/14/19 A1C 7.1 on novolin 70/30 which he gets at [facility] 6. COPD previously noted no albuterol at times he feels with exertion has sob will try albuterol    Review of Systems  Constitutional: Negative for weight loss.  HENT:       +nasal congestion   Eyes: Negative for blurred vision.  Respiratory: Negative for shortness of breath.   Cardiovascular: Negative for chest pain.  Gastrointestinal: Negative for abdominal pain.  Musculoskeletal: Positive for joint pain. Negative for falls.  Skin: Negative for rash.  Psychiatric/Behavioral: Negative for memory loss.   Past Medical History:  Diagnosis Date  . Adenomatous colon polyp 06/2001  . ALLERGIC RHINITIS 08/03/2007  . Allergy   . Asthma   . BENIGN PROSTATIC HYPERTROPHY 08/03/2007  . Chronic back pain   . COPD (chronic obstructive pulmonary disease) ([place])   . 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 Diabetes mellitus without complication ([place])    x Q000111Q years as of 11/08/18   . DIABETIC RETINOPATHY, BACKGROUND 09/29/2008  . DYSPHAGIA UNSPECIFIED 09/29/2008  . 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 GERD 11/28/2008  . HYPERLIPIDEMIA 08/03/2007  . Kidney stones     Dr. Boneta Lucks  . 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 PAIN IN SOFT TISSUES OF LIMB 01/07/2008  . UNSPECIFIED ANEMIA 12/29/2007  . Unspecified essential hypertension 01/07/2008  . URINARY CALCULUS 09/29/2008  . Urine incontinence    Past Surgical History:  Procedure Laterality Date  . CHOLECYSTECTOMY N/A 11/12/2016   Procedure: LAPAROSCOPIC CHOLECYSTECTOMY WITH INTRAOPERATIVE CHOLANGIOGRAM;  Surgeon: Clayburn Pert, MD;  Location: [facility] ORS;  Service: General;  Laterality: N/A;  . COLONOSCOPY     polyp  . HAND SURGERY Right    2016  . LITHOTRIPSY    . SHOULDER SURGERY Left    left, cyst and tumor removed  . URINARY SURGERY  2018   Dr Daiva Eves "stretched" urethra around prostate to increase urine flow   Family History  Problem Relation Age of Onset  . Diabetes Mother   . Depression Mother   . Prostate cancer Brother   . Diabetes Brother   . Heart disease Brother   . Diabetes Brother   . Colon cancer Neg Hx   . Esophageal cancer Neg Hx   . Rectal cancer Neg Hx   . Stomach cancer Neg Hx    Social History   Socioeconomic History  . Marital status: Married    Spouse name: Not on file  . Number of children: 2  . Years of education: Not on file  . Highest  education level: Not on file  Occupational History  . Occupation: Metal Fabrication    Employer: [employer]  Tobacco Use  . Smoking status: Never Smoker  . Smokeless tobacco: Never Used  Substance and Sexual Activity  . Alcohol use: Yes    Comment: occasional beer  . Drug use: No  . Sexual activity: Not on file  Other Topics Concern  . 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 Difficulty of Paying Living Expenses: Not hard at all  Food Insecurity: No Food Insecurity  . Worried About Charity fundraiser in the Last  Year: Never true  . Ran Out of Food in the Last Year: Never true  Transportation Needs: No Transportation Needs  . Lack of Transportation (Medical): No  . Lack of Transportation (Non-Medical): No  Physical Activity: Insufficiently Active  . Days of Exercise per Week: 1 day  . Minutes of Exercise per Session: 60 min  Stress: No Stress Concern Present  . Feeling of Stress : Not at all  Social Connections:   . Frequency of Communication with Friends and Family: Not on file  . Frequency of Social Gatherings with Friends and Family: Not on file  . Attends Religious Services: Not on file  . Active Member of Clubs or Organizations: Not on file  . Attends Archivist Meetings: Not on file  . Marital Status: Not on file  Intimate Partner Violence: Not At Risk  . Fear of Current or Ex-Partner: No  . Emotionally Abused: No  . Physically Abused: No  . Sexually Abused: No   Current Meds  Medication Sig  . amLODipine (NORVASC) 2.5 MG tablet Take 1 tablet (2.5 mg total) by mouth daily.  Marland Kitchen aspirin 81 MG tablet Take 81 mg by mouth daily.  Marland Kitchen atorvastatin (LIPITOR) 20 MG tablet Take 20 mg by mouth daily at 6 PM.  . Cholecalciferol (VITAMIN D3) 125 MCG (5000 UT) TABS Take 1 tablet by mouth daily.   . cyanocobalamin (,VITAMIN B-12,) 1000 MCG/ML injection Inject 1 mL (1,000 mcg total) into the muscle every 30 (thirty) days.  . insulin NPH-regular Human (NOVOLIN 70/30 RELION) (70-30) 100 UNIT/ML injection INJECT 150 UNITS SUBCUTANEOUSLY WITH BREAKFAST AND 50 UNITS WITH SUPPER.  . Insulin Syringe-Needle U-100 (RELION INSULIN SYR 1CC/30G) 30G X 5/16" 1 ML MISC 1 each by Does not apply route 2 (two) times a day. Use to inject insulin BID; E11.9  . Lancets (ONETOUCH ULTRASOFT) lancets Use as instructed 2x daily Dx: 250.00  . losartan-hydrochlorothiazide (HYZAAR) 100-25 MG tablet Take 1 tablet by mouth daily.  Marland Kitchen MAGNESIUM PO Take 1 tablet by mouth daily.   . metoprolol succinate (TOPROL-XL) 25 MG 24  hr tablet Take 1 tablet (25 mg total) by mouth daily.  Marland Kitchen omeprazole (PRILOSEC) 40 MG capsule Take 1 capsule (40 mg total) by mouth daily.  Glory Rosebush ULTRA test strip TEST TWICE A DAY  . tamsulosin (FLOMAX) 0.4 MG CAPS capsule Take 1 capsule by mouth daily.  . vitamin E 400 UNIT capsule Take 400 Units by mouth daily.   Allergies  Allergen Reactions  . Sulfamethoxazole-Trimethoprim Rash   Recent Results (from the past 2160 hour(s))  POCT HgB A1C  Status: Abnormal   Collection Time: 12/14/19  9:32 AM  Result Value Ref Range   Hemoglobin A1C 7.1 (A) 4.0 - 5.6 %   HbA1c POC (<> result, manual entry)     HbA1c, POC (prediabetic range)     HbA1c, POC (controlled diabetic range)     Objective  Body mass index is 31.06 kg/m. Wt Readings from Last 3 Encounters:  01/20/20 229 lb (103.9 kg)  12/14/19 231 lb (104.8 kg)  10/14/19 220 lb (99.8 kg)   Temp Readings from Last 3 Encounters:  01/20/20 (!) 97.5 F (36.4 C) (Temporal)  07/07/19 98.5 F (36.9 C) (Oral)  06/27/19 98 F (36.7 C) (Oral)   BP Readings from Last 3 Encounters:  01/20/20 118/70  12/14/19 (!) 170/82  10/14/19 (!) 142/68   Pulse Readings from Last 3 Encounters:  01/20/20 75  12/14/19 70  07/11/19 64    Physical Exam Vitals and nursing note reviewed.  Constitutional:      Appearance: Normal appearance. He is well-developed and well-groomed.  HENT:     Head: Normocephalic and atraumatic.  Eyes:     Conjunctiva/sclera: Conjunctivae normal.     Pupils: Pupils are equal, round, and reactive to light.  Cardiovascular:     Rate and Rhythm: Normal rate and regular rhythm.     Heart sounds: Normal heart sounds. No murmur.  Pulmonary:     Effort: Pulmonary effort is normal.     Breath sounds: Normal breath sounds.  Abdominal:     General: Abdomen is flat. Bowel sounds are normal.     Tenderness: There is no abdominal tenderness.  Musculoskeletal:        General: Tenderness present.     Right shoulder:  Tenderness present. Decreased range of motion.       Arms:     Right lower leg: No edema.     Left lower leg: No edema.  Skin:    General: Skin is warm and dry.  Neurological:     General: No focal deficit present.     Mental Status: He is alert and oriented to person, place, and time. Mental status is at baseline.     Gait: Gait normal.  Psychiatric:        Attention and Perception: Attention and perception normal.        Mood and Affect: Mood and affect normal.        Speech: Speech normal.        Behavior: Behavior normal. Behavior is cooperative.        Thought Content: Thought content normal.        Cognition and Memory: Cognition and memory normal.        Judgment: Judgment normal.     Assessment  Plan  Acute pain of right shoulder Xray +arthritis- Plan: DG Shoulder Right, diclofenac Sodium (VOLTAREN) 1 % GEL Consider referral to below pt to let me know he previously had been to [facility] remotely but not Dr. Roland Rack which is currently who I rec  Dr. Mack Guise [facility]  Dr. Onnie Graham [facility] in [place]  Dr. Maureen Ralphs  Dr. Roland Rack [facility] clinic)    Allergic rhinitis, unspecified seasonality, unspecified trigger - Plan: fluticasone (FLONASE) 50 MCG/ACT nasal spray, sodium chloride (OCEAN) 0.65 % SOLN nasal spray otc antihistamine   Pulmonary emphysema, unspecified emphysema type ([place]) - Plan: albuterol (VENTOLIN HFA) 108 (90 Base) MCG/ACT inhaler  B12 deficiency Was going to give B12 injection today but did not he will need to  come back for this and sch B12 injections monthly unable to give at home Last given 10/13/2019    Essential hypertension  Cont meds  BP improved   DM2 A1C 7.1 11/2019  F/u Dr. Loanne Drilling 01/2020   HM Flu shot utdhad 07/27/19 Tdap utd 08/31/18  prevnar and pna 23 utd  Consider shingrix vaccinein future covid 19 vx moderna had 01/05/20, next sch 02/02/20  Hep A immune, consider hep B new vaccine x 2 doses in future with h/o fatty liver  Hep C negative  07/09/16   PSA-pt due to f/u with Dr. 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 Stark3/12/2018 5 polyps tubular repeat in 3 years  Dermatology established with [facility] last seen 12/02/17  Dr. Raliegh Ip cardsappt10/2020had stress test not pfts yet  Pharmacy [facility] insulin otherwise [place]   Provider: Dr. [place] Mackie McLean-Scocuzza-Internal Medicine

## 2020-01-23 ENCOUNTER — Telehealth: Payer: Self-pay | Admitting: Internal Medicine

## 2020-01-23 DIAGNOSIS — J439 Emphysema, unspecified: Secondary | ICD-10-CM | POA: Insufficient documentation

## 2020-01-23 DIAGNOSIS — N281 Cyst of kidney, acquired: Secondary | ICD-10-CM | POA: Insufficient documentation

## 2020-01-23 DIAGNOSIS — M25511 Pain in right shoulder: Secondary | ICD-10-CM | POA: Insufficient documentation

## 2020-01-23 DIAGNOSIS — M19011 Primary osteoarthritis, right shoulder: Secondary | ICD-10-CM | POA: Insufficient documentation

## 2020-01-23 NOTE — Telephone Encounter (Signed)
Patient informed and verbalized understanding.   Patient declines referral and will wait and see how the exercises Dr [place] Mackie recommended help. State he will call back if he changes his mind.

## 2020-01-23 NOTE — Telephone Encounter (Signed)
Moderate arthritis right shoulder  Does he want referral to ortho?   -we discussed Dr. Roland Rack in [facility] clinic) Also there is Dr. Mack Guise [facility]   In [place] -Dr. Onnie Graham Dr. Maureen Ralphs  Let me know and we will place referral

## 2020-01-24 ENCOUNTER — Telehealth: Payer: Self-pay

## 2020-01-24 NOTE — Telephone Encounter (Signed)
Patient scheduled for b12 on 3/04 and follow up with Dr [place] Mackie 07/01.

## 2020-01-24 NOTE — Telephone Encounter (Signed)
-----   Message from Delorise Jackson, MD sent at 01/23/2020  2:08 PM EST ----- Forget to mention he wanted B12 shot 01/20/20 see if he can come back for RN visit B12 shot and sch 1x/month B12 shots after this He is overdue Apologize to pt forgot about it getting him ready for Xray of his shoulder

## 2020-01-24 NOTE — Telephone Encounter (Signed)
No Note Needed °

## 2020-01-26 ENCOUNTER — Other Ambulatory Visit: Payer: Self-pay

## 2020-01-26 ENCOUNTER — Ambulatory Visit (INDEPENDENT_AMBULATORY_CARE_PROVIDER_SITE_OTHER): Payer: PPO

## 2020-01-26 DIAGNOSIS — E538 Deficiency of other specified B group vitamins: Secondary | ICD-10-CM | POA: Diagnosis not present

## 2020-01-26 MED ORDER — CYANOCOBALAMIN 1000 MCG/ML IJ SOLN
1000.0000 ug | Freq: Once | INTRAMUSCULAR | Status: AC
  Administered 2020-01-26: 1000 ug via INTRAMUSCULAR

## 2020-01-26 NOTE — Progress Notes (Addendum)
Patient presented for B 12 injection to left deltoid, patient voiced no concerns nor showed any signs of distress during injection.  Reviewed.  Dr Scott 

## 2020-02-09 ENCOUNTER — Other Ambulatory Visit: Payer: Self-pay

## 2020-02-13 ENCOUNTER — Other Ambulatory Visit: Payer: Self-pay

## 2020-02-13 ENCOUNTER — Ambulatory Visit: Payer: PPO | Admitting: Endocrinology

## 2020-02-13 ENCOUNTER — Encounter: Payer: Self-pay | Admitting: Endocrinology

## 2020-02-13 VITALS — BP 112/60 | HR 75 | Ht 72.0 in | Wt 225.0 lb

## 2020-02-13 DIAGNOSIS — E11319 Type 2 diabetes mellitus with unspecified diabetic retinopathy without macular edema: Secondary | ICD-10-CM

## 2020-02-13 DIAGNOSIS — Z794 Long term (current) use of insulin: Secondary | ICD-10-CM | POA: Diagnosis not present

## 2020-02-13 LAB — POCT GLYCOSYLATED HEMOGLOBIN (HGB A1C): Hemoglobin A1C: 7.4 % — AB (ref 4.0–5.6)

## 2020-02-13 MED ORDER — NOVOLIN 70/30 RELION (70-30) 100 UNIT/ML ~~LOC~~ SUSP: SUBCUTANEOUS | 3 refills | Status: DC

## 2020-02-13 NOTE — Progress Notes (Signed)
Subjective:    Patient ID: [name], male    DOB: [birth year], 74 y.o.   MRN: [id number]  HPI Pt returns for f/u of diabetes mellitus: DM type: Insulin-requiring type 2 Dx'ed: Q000111Q Complications: renal insuff and retinopathy.  Therapy: insulin since soon after dx.   DKA: never.  Severe hypoglycemia: never.   Pancreatitis: never.   SDOH: he cannot afford brand name meds.   Other: he is on a BID insulin schedule, due to noncompliance; he is retired.   Interval history: pt states cbg's vary from 75-300's, but most are in the 100's.  It is in general higher as the day goes on.  He seldom has hypoglycemia, and these episodes are mild.  pt states he feels well in general.   Past Medical History:  Diagnosis Date  . Adenomatous colon polyp 06/2001  . ALLERGIC RHINITIS 08/03/2007  . Allergy   . Asthma   . BENIGN PROSTATIC HYPERTROPHY 08/03/2007  . Chronic back pain   . COPD (chronic obstructive pulmonary disease) ([place])   . 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 Diabetes mellitus without complication ([place])    x Q000111Q years as of 11/08/18   . DIABETIC RETINOPATHY, BACKGROUND 09/29/2008  . DYSPHAGIA UNSPECIFIED 09/29/2008  . 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 GERD 11/28/2008  . HYPERLIPIDEMIA 08/03/2007  . Kidney stones    Dr. Boneta Lucks  . 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 PAIN IN SOFT TISSUES OF LIMB 01/07/2008  . UNSPECIFIED ANEMIA 12/29/2007  . Unspecified essential hypertension 01/07/2008  . URINARY CALCULUS 09/29/2008  . Urine incontinence     Past Surgical History:  Procedure Laterality Date  . CHOLECYSTECTOMY N/A 11/12/2016   Procedure: LAPAROSCOPIC CHOLECYSTECTOMY WITH INTRAOPERATIVE CHOLANGIOGRAM;  Surgeon: Clayburn Pert, MD;  Location: [facility] ORS;  Service: General;  Laterality: N/A;  . COLONOSCOPY     polyp  . HAND SURGERY Right    2016  . LITHOTRIPSY    . SHOULDER SURGERY Left    left, cyst and tumor removed  . URINARY SURGERY  2018   Dr Daiva Eves "stretched" urethra around prostate to increase urine flow    Social History   Socioeconomic History  . Marital status: Married    Spouse name: Not on file  . Number of children: 2  . Years of education: Not on file  . Highest education level: Not on file  Occupational History  . Occupation: Metal Fabrication    Employer: [employer]  Tobacco Use  . Smoking status: Never Smoker  . Smokeless tobacco: Never Used  Substance and Sexual Activity  . Alcohol use: Yes    Comment: occasional beer  . Drug use: No  . Sexual activity: Not on file  Other Topics Concern  . 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 Difficulty of Paying Living Expenses: Not hard at all  Food Insecurity: No Food Insecurity  . Worried About Charity fundraiser in the Last Year: Never true  . Ran Out of Food in the Last Year: Never true  Transportation Needs: No Transportation Needs  . Lack of Transportation (Medical): No  . Lack of Transportation (Non-Medical): No  Physical Activity: Insufficiently Active  . Days  of Exercise per Week: 1 day  . Minutes of Exercise per Session: 60 min  Stress: No Stress Concern Present  . Feeling of Stress : Not at all  Social Connections:   . Frequency of Communication with Friends and Family:   . Frequency of Social Gatherings with Friends and Family:   . Attends Religious Services:   . Active Member of Clubs or Organizations:   . Attends Archivist Meetings:   Marland Kitchen Marital Status:   Intimate Partner Violence: Not At Risk  . Fear of Current or Ex-Partner: No  . Emotionally Abused: No  . Physically Abused: No  . Sexually Abused: No    Current Outpatient Medications on File Prior to Visit  Medication Sig Dispense Refill  . albuterol (VENTOLIN HFA) 108 (90  Base) MCG/ACT inhaler Inhale 1-2 puffs into the lungs every 6 (six) hours as needed for wheezing or shortness of breath. 18 g 11  . amLODipine (NORVASC) 2.5 MG tablet Take 1 tablet (2.5 mg total) by mouth daily. 90 tablet 3  . aspirin 81 MG tablet Take 81 mg by mouth daily.    Marland Kitchen atorvastatin (LIPITOR) 20 MG tablet Take 20 mg by mouth daily at 6 PM.    . Cholecalciferol (VITAMIN D3) 125 MCG (5000 UT) TABS Take 1 tablet by mouth daily.     . cyanocobalamin (,VITAMIN B-12,) 1000 MCG/ML injection Inject 1 mL (1,000 mcg total) into the muscle every 30 (thirty) days. 4 mL 12  . diclofenac Sodium (VOLTAREN) 1 % GEL Apply 2 g topically 4 (four) times daily. Prn 100 g 11  . Dutasteride-Tamsulosin HCl 0.5-0.4 MG CAPS Take 0.4 mg by mouth daily.    . fluticasone (FLONASE) 50 MCG/ACT nasal spray Place 2 sprays into both nostrils daily. 16 g 11  . Insulin Syringe-Needle U-100 (RELION INSULIN SYR 1CC/30G) 30G X 5/16" 1 ML MISC 1 each by Does not apply route 2 (two) times a day. Use to inject insulin BID; E11.9 100 each 2  . Lancets (ONETOUCH ULTRASOFT) lancets Use as instructed 2x daily Dx: 250.00 100 each 5  . losartan-hydrochlorothiazide (HYZAAR) 100-25 MG tablet Take 1 tablet by mouth daily.    Marland Kitchen MAGNESIUM PO Take 1 tablet by mouth daily.     . metoprolol succinate (TOPROL-XL) 25 MG 24 hr tablet Take 1 tablet (25 mg total) by mouth daily. 90 tablet 3  . NON FORMULARY Prostate Peanut from [place].    Marland Kitchen omeprazole (PRILOSEC) 40 MG capsule Take 1 capsule (40 mg total) by mouth daily. 90 capsule 3  . ONETOUCH ULTRA test strip TEST TWICE A DAY 100 strip 9  . sodium chloride (OCEAN) 0.65 % SOLN nasal spray Place 1-2 sprays into both nostrils daily as needed for congestion. 30 mL 11  . tamsulosin (FLOMAX) 0.4 MG CAPS capsule Take 1 capsule by mouth daily.    . vitamin E 400 UNIT capsule Take 400 Units by mouth daily.     No current facility-administered medications on file prior to visit.    Allergies   Allergen Reactions  . Sulfamethoxazole-Trimethoprim Rash    Family History  Problem Relation Age of Onset  . Diabetes Mother   . Depression Mother   . Prostate cancer Brother   . Diabetes Brother   . Heart disease Brother   . Diabetes Brother   . Colon cancer Neg Hx   . Esophageal cancer Neg Hx   . Rectal cancer Neg Hx   . Stomach cancer Neg  Hx     BP 112/60   Pulse 75   Ht 6' (1.829 m)   Wt 225 lb (102.1 kg)   SpO2 96%   BMI 30.52 kg/m    Review of Systems He denies hypoglycemia    Objective:   Physical Exam VITAL SIGNS:  See vs page GENERAL: no distress Pulses: dorsalis pedis intact bilat.   MSK: no deformity of the feet CV: trace bilat leg edema Skin:  no ulcer on the feet.  normal color and temp on the feet. Neuro: sensation is intact to touch on the feet Ext: there is bilateral onychomycosis of the toenails  Lab Results  Component Value Date   HGBA1C 7.4 (A) 02/13/2020   Lab Results  Component Value Date   CREATININE 1.35 06/01/2019   BUN 17 06/01/2019   NA 139 06/01/2019   K 4.3 06/01/2019   CL 101 06/01/2019   CO2 28 06/01/2019        Assessment & Plan:  Insulin-requiring type 2 DM, with DR. Hypoglycemia: this limits aggressiveness of glycemic control Renal insuff: in this setting, he needs most of his insulin in the morning.  Patient Instructions  Please continue the same insulin: 170 units with breakfast, and 40 units with supper. However, if you are going to be active, take just 120 units that morning.    check your blood sugar twice a day.  vary the time of day when you check, between before the 3 meals, and at bedtime.  also check if you have symptoms of your blood sugar being too high or too low.  please keep a record of the readings and bring it to your next appointment here (or you can bring the meter itself).  You can write it on any piece of paper.  please call us sooner if your blood sugar goes below 70, or if you have a lot of  readings over 200.   Please come back for a follow-up appointment in 3-4 months.

## 2020-02-13 NOTE — Patient Instructions (Addendum)
Please continue the same insulin: 170 units with breakfast, and 40 units with supper. However, if you are going to be active, take just 120 units that morning.    check your blood sugar twice a day.  vary the time of day when you check, between before the 3 meals, and at bedtime.  also check if you have symptoms of your blood sugar being too high or too low.  please keep a record of the readings and bring it to your next appointment here (or you can bring the meter itself).  You can write it on any piece of paper.  please call us sooner if your blood sugar goes below 70, or if you have a lot of readings over 200.   Please come back for a follow-up appointment in 3-4 months.

## 2020-02-28 ENCOUNTER — Ambulatory Visit (INDEPENDENT_AMBULATORY_CARE_PROVIDER_SITE_OTHER): Payer: PPO

## 2020-02-28 ENCOUNTER — Other Ambulatory Visit: Payer: Self-pay

## 2020-02-28 DIAGNOSIS — E538 Deficiency of other specified B group vitamins: Secondary | ICD-10-CM

## 2020-02-28 MED ORDER — CYANOCOBALAMIN 1000 MCG/ML IJ SOLN
1000.0000 ug | Freq: Once | INTRAMUSCULAR | Status: AC
  Administered 2020-02-28: 1000 ug via INTRAMUSCULAR

## 2020-02-28 NOTE — Progress Notes (Signed)
Patient presented for B 12 injection to left deltoid, patient voiced no concerns nor showed any signs of distress during injection. 

## 2020-03-06 ENCOUNTER — Other Ambulatory Visit: Payer: Self-pay | Admitting: Internal Medicine

## 2020-03-06 DIAGNOSIS — I2510 Atherosclerotic heart disease of native coronary artery without angina pectoris: Secondary | ICD-10-CM

## 2020-03-06 DIAGNOSIS — I10 Essential (primary) hypertension: Secondary | ICD-10-CM

## 2020-03-06 MED ORDER — METOPROLOL SUCCINATE ER 25 MG PO TB24: 25.0000 mg | ORAL_TABLET | Freq: Every day | ORAL | 3 refills | Status: DC

## 2020-03-11 ENCOUNTER — Other Ambulatory Visit: Payer: Self-pay | Admitting: Endocrinology

## 2020-03-11 DIAGNOSIS — Z794 Long term (current) use of insulin: Secondary | ICD-10-CM

## 2020-03-11 DIAGNOSIS — E11319 Type 2 diabetes mellitus with unspecified diabetic retinopathy without macular edema: Secondary | ICD-10-CM

## 2020-03-30 ENCOUNTER — Telehealth: Payer: Self-pay | Admitting: Internal Medicine

## 2020-03-30 NOTE — Telephone Encounter (Signed)
Faxed Medication Therapy Recommendation to [facility] on 03/29/20  Medication- inhaled steroid  Will consider

## 2020-05-04 ENCOUNTER — Other Ambulatory Visit: Payer: Self-pay | Admitting: Endocrinology

## 2020-05-04 DIAGNOSIS — E11319 Type 2 diabetes mellitus with unspecified diabetic retinopathy without macular edema: Secondary | ICD-10-CM

## 2020-05-24 ENCOUNTER — Ambulatory Visit (INDEPENDENT_AMBULATORY_CARE_PROVIDER_SITE_OTHER): Payer: PPO | Admitting: Internal Medicine

## 2020-05-24 ENCOUNTER — Encounter: Payer: Self-pay | Admitting: Internal Medicine

## 2020-05-24 ENCOUNTER — Other Ambulatory Visit: Payer: Self-pay

## 2020-05-24 VITALS — BP 126/78 | HR 64 | Temp 97.6°F | Ht 72.0 in | Wt 211.8 lb

## 2020-05-24 DIAGNOSIS — H60543 Acute eczematoid otitis externa, bilateral: Secondary | ICD-10-CM

## 2020-05-24 DIAGNOSIS — Z794 Long term (current) use of insulin: Secondary | ICD-10-CM

## 2020-05-24 DIAGNOSIS — E1159 Type 2 diabetes mellitus with other circulatory complications: Secondary | ICD-10-CM

## 2020-05-24 DIAGNOSIS — E538 Deficiency of other specified B group vitamins: Secondary | ICD-10-CM

## 2020-05-24 DIAGNOSIS — I10 Essential (primary) hypertension: Secondary | ICD-10-CM

## 2020-05-24 DIAGNOSIS — E1165 Type 2 diabetes mellitus with hyperglycemia: Secondary | ICD-10-CM | POA: Diagnosis not present

## 2020-05-24 DIAGNOSIS — Z125 Encounter for screening for malignant neoplasm of prostate: Secondary | ICD-10-CM | POA: Diagnosis not present

## 2020-05-24 DIAGNOSIS — M25511 Pain in right shoulder: Secondary | ICD-10-CM

## 2020-05-24 DIAGNOSIS — E559 Vitamin D deficiency, unspecified: Secondary | ICD-10-CM

## 2020-05-24 DIAGNOSIS — Z1329 Encounter for screening for other suspected endocrine disorder: Secondary | ICD-10-CM

## 2020-05-24 DIAGNOSIS — G8929 Other chronic pain: Secondary | ICD-10-CM | POA: Diagnosis not present

## 2020-05-24 MED ORDER — AMLODIPINE BESYLATE 2.5 MG PO TABS: 2.5000 mg | ORAL_TABLET | Freq: Every day | ORAL | 3 refills | Status: DC

## 2020-05-24 MED ORDER — FLUOCINOLONE ACETONIDE 0.01 % OT OIL: 5.0000 [drp] | TOPICAL_OIL | Freq: Two times a day (BID) | OTIC | 11 refills | Status: DC | PRN

## 2020-05-24 MED ORDER — CYANOCOBALAMIN 1000 MCG/ML IJ SOLN
1000.0000 ug | INTRAMUSCULAR | Status: DC
  Administered 2020-05-24 – 2020-08-01 (×3): 1000 ug via INTRAMUSCULAR

## 2020-05-24 NOTE — Progress Notes (Signed)
Patient flagged: Current status:  PATIENT IS OVERDUE FOR BMI FOLLOW UP PLAN BMI is estimated to be 28.7 based on the last recorded weight and height

## 2020-05-24 NOTE — Patient Instructions (Addendum)
Hypoglycemia  Hypoglycemia occurs when the level of sugar (glucose) in the blood is too low. Hypoglycemia can happen in people who do or do not have diabetes. It can develop quickly, and it can be a medical emergency. For most people with diabetes, a blood glucose level below 70 mg/dL (3.9 mmol/L) is considered hypoglycemia.  Glucose is a type of sugar that provides the body's main source of energy. Certain hormones (insulin and glucagon) control the level of glucose in the blood. Insulin lowers blood glucose, and glucagon raises blood glucose. Hypoglycemia can result from having too much insulin in the bloodstream, or from not eating enough food that contains glucose. You may also have reactive hypoglycemia, which happens within 4 hours after eating a meal.  What are the causes?  Hypoglycemia occurs most often in people who have diabetes and may be caused by:   Diabetes medicine.   Not eating enough, or not eating often enough.   Increased physical activity.   Drinking alcohol on an empty stomach.  If you do not have diabetes, hypoglycemia may be caused by:   A tumor in the pancreas.   Not eating enough, or not eating for long periods at a time (fasting).   A severe infection or illness.   Certain medicines.  What increases the risk?  Hypoglycemia is more likely to develop in:   People who have diabetes and take medicines to lower blood glucose.   People who abuse alcohol.   People who have a severe illness.  What are the signs or symptoms?  Mild symptoms  Mild hypoglycemia may not cause any symptoms. If you do have symptoms, they may include:   Hunger.   Anxiety.   Sweating and feeling clammy.   Dizziness or feeling light-headed.   Sleepiness.   Nausea.   Increased heart rate.   Headache.   Blurry vision.   Irritability.   Tingling or numbness around the mouth, lips, or tongue.   A change in coordination.   Restless sleep.  Moderate symptoms  Moderate hypoglycemia can cause:   Mental  confusion and poor judgment.   Behavior changes.   Weakness.   Irregular heartbeat.  Severe symptoms  Severe hypoglycemia is a medical emergency. It can cause:   Fainting.   Seizures.   Loss of consciousness (coma).   Death.  How is this diagnosed?  Hypoglycemia is diagnosed with a blood test to measure your blood glucose level. This blood test is done while you are having symptoms. Your health care provider may also do a physical exam and review your medical history.  How is this treated?  This condition can often be treated by immediately eating or drinking something that contains sugar, such as:   Fruit juice, 4-6 oz (120-150 mL).   Regular soda (not diet soda), 4-6 oz (120-150 mL).   Low-fat milk, 4 oz (120 mL).   Several pieces of hard candy.   Sugar or honey, 1 Tbsp (15 mL).  Treating hypoglycemia if you have diabetes  If you are alert and able to swallow safely, follow the 15:15 rule:   Take 15 grams of a rapid-acting carbohydrate. Talk with your health care provider about how much you should take.   Rapid-acting options include:  ? Glucose pills (take 15 grams).  ? 6-8 pieces of hard candy.  ? 4-6 oz (120-150 mL) of fruit juice.  ? 4-6 oz (120-150 mL) of regular (not diet) soda.  ? 1 Tbsp (15 mL) honey or   to normal, eat a meal or a snack within 1 hour.  Treating severe hypoglycemia Severe hypoglycemia is when your blood glucose level is at or below 54 mg/dL (3 mmol/L). Severe hypoglycemia is a medical emergency. Get medical help right away. If you have severe hypoglycemia and you cannot eat or drink, you may need an injection of  glucagon. A family member or close friend should learn how to check your blood glucose and how to give you a glucagon injection. Ask your health care provider if you need to have an emergency glucagon injection kit available. Severe hypoglycemia may need to be treated in a hospital. The treatment may include getting glucose through an IV. You may also need treatment for the cause of your hypoglycemia. Follow these instructions at home:  General instructions  Take over-the-counter and prescription medicines only as told by your health care provider.  Monitor your blood glucose as told by your health care provider.  Limit alcohol intake to no more than 1 drink a day for nonpregnant women and 2 drinks a day for men. One drink equals 12 oz of beer (355 mL), 5 oz of wine (148 mL), or 1 oz of hard liquor (44 mL).  Keep all follow-up visits as told by your health care provider. This is important. If you have diabetes:  Always have a rapid-acting carbohydrate snack with you to treat low blood glucose.  Follow your diabetes management plan as directed. Make sure you: ? Know the symptoms of hypoglycemia. It is important to treat it right away to prevent it from becoming severe. ? Take your medicines as directed. ? Follow your exercise plan. ? Follow your meal plan. Eat on time, and do not skip meals. ? Check your blood glucose as often as directed. Always check before and after exercise. ? Follow your sick day plan whenever you cannot eat or drink normally. Make this plan in advance with your health care provider.  Share your diabetes management plan with people in your workplace, school, and household.  Check your urine for ketones when you are ill and as told by your health care provider.  Carry a medical alert card or wear medical alert jewelry. Contact a health care provider if:  You have problems keeping your blood glucose in your target range.  You have frequent episodes of  hypoglycemia. Get help right away if:  You continue to have hypoglycemia symptoms after eating or drinking something containing glucose.  Your blood glucose is at or below 54 mg/dL (3 mmol/L).  You have a seizure.  You faint. These symptoms may represent a serious problem that is an emergency. Do not wait to see if the symptoms will go away. Get medical help right away. Call your local emergency services (911 in the [place] Summary  Hypoglycemia occurs when the level of sugar (glucose) in the blood is too low.  Hypoglycemia can happen in people who do or do not have diabetes. It can develop quickly, and it can be a medical emergency.  Make sure you know the symptoms of hypoglycemia and how to treat it.  Always have a rapid-acting carbohydrate snack with you to treat low blood sugar. This information is not intended to replace advice given to you by your health care provider. Make sure you discuss any questions you have with your health care provider. Document Revised: 05/03/2018 Document Reviewed: 12/14/2015 Elsevier Patient Education  [place].  Shoulder Range of Motion Exercises Shoulder range of motion (ROM) exercises  are done to keep the shoulder moving freely or to increase movement. They are often recommended for people who have shoulder pain or stiffness or who are recovering from a shoulder surgery. Phase 1 exercises When you are able, do this exercise 1-2 times per day for 30-60 seconds in each direction, or as directed by your health care provider. Pendulum exercise To do this exercise while sitting: 1. Sit in a chair or at the edge of your bed with your feet flat on the floor. 2. Let your affected arm hang down in front of you over the edge of the bed or chair. 3. Relax your shoulder, arm, and hand. Goodman your body so your arm gently swings in small circles. You can also use your unaffected arm to start the motion. 5. Repeat changing the direction of the  circles, swinging your arm left and right, and swinging your arm forward and back. To do this exercise while standing: 1. Stand next to a sturdy chair or table, and hold on to it with your hand on your unaffected side. 2. Bend forward at the waist. 3. Bend your knees slightly. 4. Relax your shoulder, arm, and hand. 5. While keeping your shoulder relaxed, use body motion to swing your arm in small circles. 6. Repeat changing the direction of the circles, swinging your arm left and right, and swinging your arm forward and back. 7. Between exercises, stand up tall and take a short break to relax your lower back.  Phase 2 exercises Do these exercises 1-2 times per day or as told by your health care provider. Hold each stretch for 30 seconds, and repeat 3 times. Do the exercises with one or both arms as instructed by your health care provider. For these exercises, sit at a table with your hand and arm supported by the table. A chair that slides easily or has wheels can be helpful. External rotation 1. Turn your chair so that your affected side is nearest to the table. 2. Place your forearm on the table to your side. Bend your elbow about 90 at the elbow (right angle) and place your hand palm facing down on the table. Your elbow should be about 6 inches away from your side. 3. Keeping your arm on the table, lean your body forward. Abduction 1. Turn your chair so that your affected side is nearest to the table. 2. Place your forearm and hand on the table so that your thumb points toward the ceiling and your arm is straight out to your side. 3. Slide your hand out to the side and away from you, using your unaffected arm to do the work. 4. To increase the stretch, you can slide your chair away from the table. Flexion: forward stretch 1. Sit facing the table. Place your hand and elbow on the table in front of you. 2. Slide your hand forward and away from you, using your unaffected arm to do the  work. 3. To increase the stretch, you can slide your chair backward. Phase 3 exercises Do these exercises 1-2 times per day or as told by your health care provider. Hold each stretch for 30 seconds, and repeat 3 times. Do the exercises with one or both arms as instructed by your health care provider. Cross-body stretch: posterior capsule stretch 1. Lift your arm straight out in front of you. 2. Bend your arm 90 at the elbow (right angle) so your forearm moves across your body. 3. Use your other arm to  gently pull the elbow across your body, toward your other shoulder. Wall climbs 1. Stand with your affected arm extended out to the side with your hand resting on a door frame. 2. Slide your hand slowly up the door frame. 3. To increase the stretch, step through the door frame. Keep your body upright and do not lean. Wand exercises You will need a cane, a piece of PVC pipe, or a sturdy wooden dowel for wand exercises. Flexion To do this exercise while standing: 1. Hold the wand with both of your hands, palms down. 2. Using the other arm to help, lift your arms up and over your head, if able. 3. Push upward with your other arm to gently increase the stretch. To do this exercise while lying down: 1. Lie on your back with your elbows resting on the floor and the wand in both your hands. Your hands will be palm down, or pointing toward your feet. 2. Lift your hands toward the ceiling, using your unaffected arm to help if needed. 3. Bring your arms overhead as able, using your unaffected arm to help if needed. Internal rotation 1. Stand while holding the wand behind you with both hands. Your unaffected arm should be extended above your head with the arm of the affected side extended behind you at the level of your waist. The wand should be pointing straight up and down as you hold it. 2. Slowly pull the wand up behind your back by straightening the elbow of your unaffected arm and bending the elbow  of your affected arm. External rotation 1. Lie on your back with your affected upper arm supported on a small pillow or rolled towel. When you first do this exercise, keep your upper arm close to your body. Over time, bring your arm up to a 90 angle out to the side. 2. Hold the wand across your stomach and with both hands palm up. Your elbow on your affected side should be bent at a 90 angle. 3. Use your unaffected side to help push your forearm away from you and toward the floor. Keep your elbow on your affected side bent at a 90 angle. Contact a health care provider if you have:  New or increasing pain.  New numbness, tingling, weakness, or discoloration in your arm or hand. This information is not intended to replace advice given to you by your health care provider. Make sure you discuss any questions you have with your health care provider. Document Revised: 12/23/2017 Document Reviewed: 12/23/2017 Elsevier Patient Education  [place].  Shoulder Exercises Ask your health care provider which exercises are safe for you. Do exercises exactly as told by your health care provider and adjust them as directed. It is normal to feel mild stretching, pulling, tightness, or discomfort as you do these exercises. Stop right away if you feel sudden pain or your pain gets worse. Do not begin these exercises until told by your health care provider. Stretching exercises External rotation and abduction This exercise is sometimes called corner stretch. This exercise rotates your arm outward (external rotation) and moves your arm out from your body (abduction). 1. Stand in a doorway with one of your feet slightly in front of the other. This is called a staggered stance. If you cannot reach your forearms to the door frame, stand facing a corner of a room. 2. Choose one of the following positions as told by your health care provider: ? Place your hands and forearms on  the door frame above your  head. ? Place your hands and forearms on the door frame at the height of your head. ? Place your hands on the door frame at the height of your elbows. 3. Slowly move your weight onto your front foot until you feel a stretch across your chest and in the front of your shoulders. Keep your head and chest upright and keep your abdominal muscles tight. 4. Hold for __________ seconds. 5. To release the stretch, shift your weight to your back foot. Repeat __________ times. Complete this exercise __________ times a day. Extension, standing 1. Stand and hold a broomstick, a cane, or a similar object behind your back. ? Your hands should be a little wider than shoulder width apart. ? Your palms should face away from your back. 2. Keeping your elbows straight and your shoulder muscles relaxed, move the stick away from your body until you feel a stretch in your shoulders (extension). ? Avoid shrugging your shoulders while you move the stick. Keep your shoulder blades tucked down toward the middle of your back. 3. Hold for __________ seconds. 4. Slowly return to the starting position. Repeat __________ times. Complete this exercise __________ times a day. Range-of-motion exercises Pendulum  1. Stand near a wall or a surface that you can hold onto for balance. 2. Bend at the waist and let your left / right arm hang straight down. Use your other arm to support you. Keep your back straight and do not lock your knees. 3. Relax your left / right arm and shoulder muscles, and move your hips and your trunk so your left / right arm swings freely. Your arm should swing because of the motion of your body, not because you are using your arm or shoulder muscles. 4. Keep moving your hips and trunk so your arm swings in the following directions, as told by your health care provider: ? Side to side. ? Forward and backward. ? In clockwise and counterclockwise circles. 5. Continue each motion for __________ seconds, or  for as long as told by your health care provider. 6. Slowly return to the starting position. Repeat __________ times. Complete this exercise __________ times a day. Shoulder flexion, standing  1. Stand and hold a broomstick, a cane, or a similar object. Place your hands a little more than shoulder width apart on the object. Your left / right hand should be palm up, and your other hand should be palm down. 2. Keep your elbow straight and your shoulder muscles relaxed. Push the stick up with your healthy arm to raise your left / right arm in front of your body, and then over your head until you feel a stretch in your shoulder (flexion). ? Avoid shrugging your shoulder while you raise your arm. Keep your shoulder blade tucked down toward the middle of your back. 3. Hold for __________ seconds. 4. Slowly return to the starting position. Repeat __________ times. Complete this exercise __________ times a day. Shoulder abduction, standing 1. Stand and hold a broomstick, a cane, or a similar object. Place your hands a little more than shoulder width apart on the object. Your left / right hand should be palm up, and your other hand should be palm down. 2. Keep your elbow straight and your shoulder muscles relaxed. Push the object across your body toward your left / right side. Raise your left / right arm to the side of your body (abduction) until you feel a stretch in your shoulder. ?  Do not raise your arm above shoulder height unless your health care provider tells you to do that. ? If directed, raise your arm over your head. ? Avoid shrugging your shoulder while you raise your arm. Keep your shoulder blade tucked down toward the middle of your back. 3. Hold for __________ seconds. 4. Slowly return to the starting position. Repeat __________ times. Complete this exercise __________ times a day. Internal rotation  1. Place your left / right hand behind your back, palm up. 2. Use your other hand to  dangle an exercise band, a towel, or a similar object over your shoulder. Grasp the band with your left / right hand so you are holding on to both ends. 3. Gently pull up on the band until you feel a stretch in the front of your left / right shoulder. The movement of your arm toward the center of your body is called internal rotation. ? Avoid shrugging your shoulder while you raise your arm. Keep your shoulder blade tucked down toward the middle of your back. 4. Hold for __________ seconds. 5. Release the stretch by letting go of the band and lowering your hands. Repeat __________ times. Complete this exercise __________ times a day. Strengthening exercises External rotation  1. Sit in a stable chair without armrests. 2. Secure an exercise band to a stable object at elbow height on your left / right side. 3. Place a soft object, such as a folded towel or a small pillow, between your left / right upper arm and your body to move your elbow about 4 inches (10 cm) away from your side. 4. Hold the end of the exercise band so it is tight and there is no slack. 5. Keeping your elbow pressed against the soft object, slowly move your forearm out, away from your abdomen (external rotation). Keep your body steady so only your forearm moves. 6. Hold for __________ seconds. 7. Slowly return to the starting position. Repeat __________ times. Complete this exercise __________ times a day. Shoulder abduction  1. Sit in a stable chair without armrests, or stand up. 2. Hold a __________ weight in your left / right hand, or hold an exercise band with both hands. 3. Start with your arms straight down and your left / right palm facing in, toward your body. 4. Slowly lift your left / right hand out to your side (abduction). Do not lift your hand above shoulder height unless your health care provider tells you that this is safe. ? Keep your arms straight. ? Avoid shrugging your shoulder while you do this movement.  Keep your shoulder blade tucked down toward the middle of your back. 5. Hold for __________ seconds. 6. Slowly lower your arm, and return to the starting position. Repeat __________ times. Complete this exercise __________ times a day. Shoulder extension 1. Sit in a stable chair without armrests, or stand up. 2. Secure an exercise band to a stable object in front of you so it is at shoulder height. 3. Hold one end of the exercise band in each hand. Your palms should face each other. 4. Straighten your elbows and lift your hands up to shoulder height. 5. Step back, away from the secured end of the exercise band, until the band is tight and there is no slack. 6. Squeeze your shoulder blades together as you pull your hands down to the sides of your thighs (extension). Stop when your hands are straight down by your sides. Do not let your hands  go behind your body. 7. Hold for __________ seconds. 8. Slowly return to the starting position. Repeat __________ times. Complete this exercise __________ times a day. Shoulder row 1. Sit in a stable chair without armrests, or stand up. 2. Secure an exercise band to a stable object in front of you so it is at waist height. 3. Hold one end of the exercise band in each hand. Position your palms so that your thumbs are facing the ceiling (neutral position). 4. Bend each of your elbows to a 90-degree angle (right angle) and keep your upper arms at your sides. 5. Step back until the band is tight and there is no slack. 6. Slowly pull your elbows back behind you. 7. Hold for __________ seconds. 8. Slowly return to the starting position. Repeat __________ times. Complete this exercise __________ times a day. Shoulder press-ups  1. Sit in a stable chair that has armrests. Sit upright, with your feet flat on the floor. 2. Put your hands on the armrests so your elbows are bent and your fingers are pointing forward. Your hands should be about even with the sides of  your body. 3. Push down on the armrests and use your arms to lift yourself off the chair. Straighten your elbows and lift yourself up as much as you comfortably can. ? Move your shoulder blades down, and avoid letting your shoulders move up toward your ears. ? Keep your feet on the ground. As you get stronger, your feet should support less of your body weight as you lift yourself up. 4. Hold for __________ seconds. 5. Slowly lower yourself back into the chair. Repeat __________ times. Complete this exercise __________ times a day. Wall push-ups  1. Stand so you are facing a stable wall. Your feet should be about one arm-length away from the wall. 2. Lean forward and place your palms on the wall at shoulder height. 3. Keep your feet flat on the floor as you bend your elbows and lean forward toward the wall. 4. Hold for __________ seconds. 5. Straighten your elbows to push yourself back to the starting position. Repeat __________ times. Complete this exercise __________ times a day. This information is not intended to replace advice given to you by your health care provider. Make sure you discuss any questions you have with your health care provider. Document Revised: 03/04/2019 Document Reviewed: 12/10/2018 Elsevier Patient Education  [place].

## 2020-05-24 NOTE — Progress Notes (Signed)
Chief Complaint  Patient presents with  . Follow-up  . B12 Injection   F/u  1. Shoulder pain right shoulder intermittently 5/10 declines ortho for now Xray with moderate OA will let me know in future when ready disc voltaren gel otc 2. HTN controlled on hyzaar 100-25 mg qd, toprol 25 mg qd, norvasc 2.5 mg qd 3. DM 2 f/u Dr. Loanne Drilling 02/13/20 7.4 on BPH 170 at times 120 in the am and 40 mg qhs does report episode hypoglycemia 58  4. B12 def B12 injection due today  5. Ears peeling and itching chronically nothing tried    Review of Systems  Constitutional: Negative for weight loss.  HENT: Negative for hearing loss.   Eyes: Negative for blurred vision.  Respiratory: Negative for shortness of breath.   Cardiovascular: Negative for palpitations.  Gastrointestinal: Negative for abdominal pain.  Musculoskeletal: Positive for joint pain.  Skin: Positive for rash.       +rash ears  Neurological: Negative for headaches.  Psychiatric/Behavioral: Negative for depression.   Past Medical History:  Diagnosis Date  . Adenomatous colon polyp 06/2001  . ALLERGIC RHINITIS 08/03/2007  . Allergy   . Asthma   . BENIGN PROSTATIC HYPERTROPHY 08/03/2007  . Chronic back pain   . COPD (chronic obstructive pulmonary disease) ([place])   . 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 Diabetes mellitus without complication ([place])    x 83-41 years as of 11/08/18   . DIABETIC RETINOPATHY, BACKGROUND 09/29/2008  . DYSPHAGIA UNSPECIFIED 09/29/2008  . 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 GERD 11/28/2008  . HYPERLIPIDEMIA 08/03/2007  . Kidney stones    Dr. Boneta Lucks  . 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 PAIN IN SOFT TISSUES OF LIMB 01/07/2008  . UNSPECIFIED ANEMIA 12/29/2007  . Unspecified essential hypertension 01/07/2008  . URINARY CALCULUS 09/29/2008  . Urine incontinence    Past Surgical History:  Procedure Laterality Date  . CHOLECYSTECTOMY N/A 11/12/2016   Procedure: LAPAROSCOPIC CHOLECYSTECTOMY  WITH INTRAOPERATIVE CHOLANGIOGRAM;  Surgeon: Clayburn Pert, MD;  Location: [facility] ORS;  Service: General;  Laterality: N/A;  . COLONOSCOPY     polyp  . HAND SURGERY Right    2016  . LITHOTRIPSY    . SHOULDER SURGERY Left    left, cyst and tumor removed  . URINARY SURGERY  2018   Dr Daiva Eves "stretched" urethra around prostate to increase urine flow   Family History  Problem Relation Age of Onset  . Diabetes Mother   . Depression Mother   . Prostate cancer Brother   . Diabetes Brother   . Heart disease Brother   . Diabetes Brother   . Colon cancer Neg Hx   . Esophageal cancer Neg Hx   . Rectal cancer Neg Hx   . Stomach cancer Neg Hx    Social History   Socioeconomic History  . Marital status: Married    Spouse name: Not on file  . Number of children: 2  . Years of education: Not on file  . Highest education level: Not on file  Occupational History  . Occupation: Metal Fabrication    Employer: [employer]  Tobacco Use  . Smoking status: Never Smoker  . Smokeless tobacco: Never Used  Vaping Use  . Vaping Use: Never used  Substance and Sexual Activity  . Alcohol use: Yes    Comment: occasional beer  . Drug use: No  . Sexual activity: Not on file  Other Topics Concern  . 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 Difficulty of Paying Living Expenses: Not hard at all  Food Insecurity: No Food Insecurity  . Worried About Charity fundraiser in the Last Year: Never true  . Ran Out of Food in the Last Year: Never true  Transportation Needs: No Transportation Needs  . Lack of Transportation (Medical): No  . Lack of Transportation (Non-Medical): No  Physical Activity: Insufficiently Active  . Days of Exercise per Week: 1 day  . Minutes of Exercise per Session: 60 min   Stress: No Stress Concern Present  . Feeling of Stress : Not at all  Social Connections:   . Frequency of Communication with Friends and Family:   . Frequency of Social Gatherings with Friends and Family:   . Attends Religious Services:   . Active Member of Clubs or Organizations:   . Attends Archivist Meetings:   Marland Kitchen Marital Status:   Intimate Partner Violence: Not At Risk  . Fear of Current or Ex-Partner: No  . Emotionally Abused: No  . Physically Abused: No  . Sexually Abused: No   Current Meds  Medication Sig  . amLODipine (NORVASC) 2.5 MG tablet Take 1 tablet (2.5 mg total) by mouth daily.  Marland Kitchen aspirin 81 MG tablet Take 81 mg by mouth daily.  Marland Kitchen atorvastatin (LIPITOR) 20 MG tablet Take 20 mg by mouth daily at 6 PM.  . Cholecalciferol (VITAMIN D3) 125 MCG (5000 UT) TABS Take 1 tablet by mouth daily.   . cyanocobalamin (,VITAMIN B-12,) 1000 MCG/ML injection Inject 1 mL (1,000 mcg total) into the muscle every 30 (thirty) days.  . insulin NPH-regular Human (NOVOLIN 70/30 RELION) (70-30) 100 UNIT/ML injection INJECT 170 UNITS SUBCUTANEOUSLY WITH BREAKFAST AND 40 UNITS WITH SUPPER  . Insulin Syringe-Needle U-100 (RELION INSULIN SYR 1CC/30G) 30G X 5/16" 1 ML MISC 1 each by Does not apply route 2 (two) times a day. Use to inject insulin BID; E11.9  . Lancets (ONETOUCH ULTRASOFT) lancets Use as instructed 2x daily Dx: 250.00  . losartan-hydrochlorothiazide (HYZAAR) 100-25 MG tablet Take 1 tablet by mouth daily.  Marland Kitchen MAGNESIUM PO Take 1 tablet by mouth daily.   . metoprolol succinate (TOPROL-XL) 25 MG 24 hr tablet Take 1 tablet (25 mg total) by mouth daily.  . NON FORMULARY Prostate Peanut from [place].  Marland Kitchen omeprazole (PRILOSEC) 40 MG capsule Take 1 capsule (40 mg total) by mouth daily.  Glory Rosebush ULTRA test strip TEST TWICE A DAY  . vitamin E 400 UNIT capsule Take 400 Units by mouth daily.  . [DISCONTINUED] amLODipine (NORVASC) 2.5 MG tablet Take 1 tablet (2.5 mg total) by  mouth daily.   Current Facility-Administered Medications for the 05/24/20 encounter (Office Visit) with McLean-Scocuzza, Nino Glow, MD  Medication  . cyanocobalamin ((VITAMIN B-12)) injection 1,000 mcg   Allergies  Allergen Reactions  . Sulfamethoxazole-Trimethoprim Rash   No results found for this or any previous visit (from the past 2160 hour(s)). Objective  Body mass index is 28.73 kg/m. Wt Readings from Last 3 Encounters:  05/24/20 211 lb 12.8 oz (96.1 kg)  02/13/20 225 lb (102.1 kg)  01/20/20 229 lb (103.9 kg)   Temp Readings from Last 3 Encounters:  05/24/20 97.6 F (36.4  C) (Oral)  01/20/20 (!) 97.5 F (36.4 C) (Temporal)  07/07/19 98.5 F (36.9 C) (Oral)   BP Readings from Last 3 Encounters:  05/24/20 126/78  02/13/20 112/60  01/20/20 118/70   Pulse Readings from Last 3 Encounters:  05/24/20 64  02/13/20 75  01/20/20 75    Physical Exam Vitals and nursing note reviewed.  Constitutional:      Appearance: Normal appearance. He is well-developed and well-groomed.  HENT:     Head: Normocephalic and atraumatic.     Comments: Eczema b/l ears R>L Eyes:     Conjunctiva/sclera: Conjunctivae normal.     Pupils: Pupils are equal, round, and reactive to light.  Cardiovascular:     Rate and Rhythm: Normal rate and regular rhythm.     Heart sounds: Normal heart sounds. No murmur heard.   Pulmonary:     Effort: Pulmonary effort is normal.     Breath sounds: Normal breath sounds.  Skin:    General: Skin is warm and dry.  Neurological:     General: No focal deficit present.     Mental Status: He is alert and oriented to person, place, and time. Mental status is at baseline.     Gait: Gait normal.  Psychiatric:        Attention and Perception: Attention and perception normal.        Mood and Affect: Mood and affect normal.        Speech: Speech normal.        Behavior: Behavior normal. Behavior is cooperative.        Thought Content: Thought content normal.         Cognition and Memory: Cognition and memory normal.        Judgment: Judgment normal.     Assessment  Plan  Hypertension associated with diabetes ([place]) - Plan: Comprehensive metabolic panel, Lipid panel, CBC with Differential/Platelet, Urinalysis, Routine w reflex microscopic, Microalbumin / creatinine urine ratio Cont meds  F/u Dr. 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 Margot Ables f/u sch'ed been >1 year as of11/20/2020 Check PSA 2021 and f/u Dr. 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 Stark3/12/2018 5 polyps tubular repeat in 3 years  Dermatology established with [facility] last seen 12/02/17  Dr. Raliegh Ip cardsappt10/2020had stress test not pfts yet  Pharmacy [facility] insulin  [facility]  [facility]  Provider: Dr. Olivia Mackie McLean-Scocuzza-Internal Medicine

## 2020-05-29 ENCOUNTER — Other Ambulatory Visit (INDEPENDENT_AMBULATORY_CARE_PROVIDER_SITE_OTHER): Payer: PPO

## 2020-05-29 ENCOUNTER — Other Ambulatory Visit: Payer: Self-pay

## 2020-05-29 ENCOUNTER — Ambulatory Visit: Payer: PPO

## 2020-05-29 DIAGNOSIS — Z1329 Encounter for screening for other suspected endocrine disorder: Secondary | ICD-10-CM | POA: Diagnosis not present

## 2020-05-29 DIAGNOSIS — I10 Essential (primary) hypertension: Secondary | ICD-10-CM

## 2020-05-29 DIAGNOSIS — Z125 Encounter for screening for malignant neoplasm of prostate: Secondary | ICD-10-CM

## 2020-05-29 DIAGNOSIS — E1159 Type 2 diabetes mellitus with other circulatory complications: Secondary | ICD-10-CM | POA: Diagnosis not present

## 2020-05-29 DIAGNOSIS — E538 Deficiency of other specified B group vitamins: Secondary | ICD-10-CM | POA: Diagnosis not present

## 2020-05-29 DIAGNOSIS — E559 Vitamin D deficiency, unspecified: Secondary | ICD-10-CM | POA: Diagnosis not present

## 2020-05-29 DIAGNOSIS — E1165 Type 2 diabetes mellitus with hyperglycemia: Secondary | ICD-10-CM | POA: Diagnosis not present

## 2020-05-29 DIAGNOSIS — Z794 Long term (current) use of insulin: Secondary | ICD-10-CM | POA: Diagnosis not present

## 2020-05-29 LAB — LIPID PANEL
Cholesterol: 80 mg/dL (ref 0–200)
HDL: 38.3 mg/dL — ABNORMAL LOW (ref >39.00)
LDL Cholesterol: 29 mg/dL (ref 0–99)
NonHDL: 42.08
Total CHOL/HDL Ratio: 2
Triglycerides: 63 mg/dL (ref 0.0–149.0)
VLDL: 12.6 mg/dL (ref 0.0–40.0)

## 2020-05-29 LAB — CBC WITH DIFFERENTIAL/PLATELET
Basophils Absolute: 0.1 10*3/uL (ref 0.0–0.1)
Basophils Relative: 0.8 % (ref 0.0–3.0)
Eosinophils Absolute: 0.6 10*3/uL (ref 0.0–0.7)
Eosinophils Relative: 8.7 % — ABNORMAL HIGH (ref 0.0–5.0)
HCT: 47.4 % (ref 39.0–52.0)
Hemoglobin: 16.5 g/dL (ref 13.0–17.0)
Lymphocytes Relative: 38.1 % (ref 12.0–46.0)
Lymphs Abs: 2.8 10*3/uL (ref 0.7–4.0)
MCHC: 34.9 g/dL (ref 30.0–36.0)
MCV: 96.1 fl (ref 78.0–100.0)
Monocytes Absolute: 0.7 10*3/uL (ref 0.1–1.0)
Monocytes Relative: 9.5 % (ref 3.0–12.0)
Neutro Abs: 3.2 10*3/uL (ref 1.4–7.7)
Neutrophils Relative %: 42.9 % — ABNORMAL LOW (ref 43.0–77.0)
Platelets: 180 10*3/uL (ref 150.0–400.0)
RBC: 4.94 Mil/uL (ref 4.22–5.81)
RDW: 13.8 % (ref 11.5–15.5)
WBC: 7.4 10*3/uL (ref 4.0–10.5)

## 2020-05-29 LAB — COMPREHENSIVE METABOLIC PANEL
ALT: 29 U/L (ref 0–53)
AST: 21 U/L (ref 0–37)
Albumin: 4.4 g/dL (ref 3.5–5.2)
Alkaline Phosphatase: 55 U/L (ref 39–117)
BUN: 24 mg/dL — ABNORMAL HIGH (ref 6–23)
CO2: 30 mEq/L (ref 19–32)
Calcium: 9.9 mg/dL (ref 8.4–10.5)
Chloride: 101 mEq/L (ref 96–112)
Creatinine, Ser: 1.44 mg/dL (ref 0.40–1.50)
GFR: 48.22 mL/min — ABNORMAL LOW (ref >60.00)
Glucose, Bld: 148 mg/dL — ABNORMAL HIGH (ref 70–99)
Potassium: 4.3 mEq/L (ref 3.5–5.1)
Sodium: 140 mEq/L (ref 135–145)
Total Bilirubin: 1 mg/dL (ref 0.2–1.2)
Total Protein: 7.2 g/dL (ref 6.0–8.3)

## 2020-05-29 LAB — TSH: TSH: 1.89 u[IU]/mL (ref 0.35–4.50)

## 2020-05-29 LAB — PSA: PSA: 3.32 ng/mL (ref 0.10–4.00)

## 2020-05-29 LAB — HEMOGLOBIN A1C: Hgb A1c MFr Bld: 7.7 % — ABNORMAL HIGH (ref 4.6–6.5)

## 2020-05-29 LAB — VITAMIN D 25 HYDROXY (VIT D DEFICIENCY, FRACTURES): VITD: 60.03 ng/mL (ref 30.00–100.00)

## 2020-05-29 LAB — VITAMIN B12: Vitamin B-12: 558 pg/mL (ref 211–911)

## 2020-05-30 LAB — URINALYSIS, ROUTINE W REFLEX MICROSCOPIC
Bilirubin Urine: NEGATIVE
Hgb urine dipstick: NEGATIVE
Ketones, ur: NEGATIVE
Leukocytes,Ua: NEGATIVE
Nitrite: NEGATIVE
Protein, ur: NEGATIVE
Specific Gravity, Urine: 1.012 (ref 1.001–1.03)
pH: 5.5 (ref 5.0–8.0)

## 2020-05-30 LAB — MICROALBUMIN / CREATININE URINE RATIO
Creatinine, Urine: 106 mg/dL (ref 20–320)
Microalb Creat Ratio: 19 mcg/mg creat (ref <30)
Microalb, Ur: 2 mg/dL

## 2020-05-31 ENCOUNTER — Other Ambulatory Visit: Payer: PPO

## 2020-06-04 ENCOUNTER — Telehealth: Payer: Self-pay | Admitting: Internal Medicine

## 2020-06-04 NOTE — Telephone Encounter (Signed)
-----   Message from Delorise Jackson, MD sent at 06/04/2020  9:02 AM EDT ----- A1C 7.7 up from 7.4 still has episodes of low blood sugar has appt Dr. Loanne Drilling upcoming (see PCP note)  Urine normal  Urine ok slight sugar in it  Cholesterol stable BUN slightly elevated and kidney #s slight increased  -increase water intake 55-64 ounces avoid nsaids  Thyroid lab normal PSA slightly increased and trending up from 3.10 to 3.32 please call and schedule appt with urology Dr. Rogers Blocker I believe  Blood cts normal  B12 normal continue shots every 30 days  Vitamin D normal

## 2020-06-04 NOTE — Telephone Encounter (Signed)
Left message to return call 

## 2020-06-11 ENCOUNTER — Other Ambulatory Visit: Payer: Self-pay | Admitting: Endocrinology

## 2020-06-11 DIAGNOSIS — Z794 Long term (current) use of insulin: Secondary | ICD-10-CM

## 2020-06-18 ENCOUNTER — Ambulatory Visit: Payer: PPO | Admitting: Endocrinology

## 2020-06-27 ENCOUNTER — Ambulatory Visit (INDEPENDENT_AMBULATORY_CARE_PROVIDER_SITE_OTHER): Payer: PPO

## 2020-06-27 ENCOUNTER — Other Ambulatory Visit: Payer: Self-pay

## 2020-06-27 ENCOUNTER — Telehealth: Payer: Self-pay | Admitting: Internal Medicine

## 2020-06-27 DIAGNOSIS — E538 Deficiency of other specified B group vitamins: Secondary | ICD-10-CM

## 2020-06-27 NOTE — Telephone Encounter (Signed)
Patient in office for b12 injection. States he is having 6/10 right shoulder pain. Right at the joint of the shoulder you can feel a hard lump there. Patient states that in the past he has a cyst removed from the left shoulder and was wondering if this is the same.   Patient recently seen for shoulder pain 01/21/20 and x-ray showed some arthritis. Patient is now agreeable to ortho referral.   Please advise

## 2020-06-27 NOTE — Telephone Encounter (Signed)
[name], CMA  01/23/2020 4:19 PM EST     Patient informed and verbalized understanding.   Patient declines referral and will wait and see how the exercises Dr Olivia Mackie recommended help. State he will call back if he changes his mind.    Joshua Glow McLean-Scocuzza, MD  01/22/2020 7:58 PM EST     Moderate arthritis right shoulder  Does he want referral to ortho?  -we discussed Dr. Roland Rack in [facility] clinic) Also there is Dr. Mack Guise [facility]   In [place] -Dr. Onnie Graham Dr. Maureen Ralphs  Let me know and we will place referral

## 2020-06-27 NOTE — Progress Notes (Signed)
Patient presented for B 12 injection to right deltoid, patient voiced no concerns nor showed any signs of distress during injection. Injection placed by Yves Dill, CMA

## 2020-06-29 NOTE — Telephone Encounter (Signed)
Referral placed Dr. Roland Rack

## 2020-06-29 NOTE — Addendum Note (Signed)
Addended by: Orland Mustard on: 06/29/2020 06:36 PM   Modules accepted: Orders

## 2020-07-10 ENCOUNTER — Other Ambulatory Visit: Payer: Self-pay | Admitting: Internal Medicine

## 2020-07-10 DIAGNOSIS — I10 Essential (primary) hypertension: Secondary | ICD-10-CM

## 2020-07-10 MED ORDER — LOSARTAN POTASSIUM-HCTZ 100-25 MG PO TABS: 1.0000 | ORAL_TABLET | Freq: Every day | ORAL | 3 refills | Status: DC

## 2020-07-24 ENCOUNTER — Encounter: Payer: Self-pay | Admitting: Endocrinology

## 2020-07-24 ENCOUNTER — Other Ambulatory Visit: Payer: Self-pay

## 2020-07-24 ENCOUNTER — Ambulatory Visit: Payer: PPO | Admitting: Endocrinology

## 2020-07-24 VITALS — BP 138/70 | HR 77 | Ht 72.0 in | Wt 215.4 lb

## 2020-07-24 DIAGNOSIS — E11319 Type 2 diabetes mellitus with unspecified diabetic retinopathy without macular edema: Secondary | ICD-10-CM

## 2020-07-24 DIAGNOSIS — Z794 Long term (current) use of insulin: Secondary | ICD-10-CM

## 2020-07-24 LAB — POCT GLYCOSYLATED HEMOGLOBIN (HGB A1C): Hemoglobin A1C: 7.3 % — AB (ref 4.0–5.6)

## 2020-07-24 MED ORDER — NOVOLIN 70/30 RELION (70-30) 100 UNIT/ML ~~LOC~~ SUSP: SUBCUTANEOUS | 11 refills | Status: DC

## 2020-07-24 NOTE — Patient Instructions (Addendum)
Please increase the insulin to 180 units with breakfast, and 40 units with supper. However, if you are going to be active, take just 120 units that morning.    check your blood sugar twice a day.  vary the time of day when you check, between before the 3 meals, and at bedtime.  also check if you have symptoms of your blood sugar being too high or too low.  please keep a record of the readings and bring it to your next appointment here (or you can bring the meter itself).  You can write it on any piece of paper.  please call us sooner if your blood sugar goes below 70, or if you have a lot of readings over 200.   Please come back for a follow-up appointment in 3-4 months.

## 2020-07-24 NOTE — Progress Notes (Signed)
Subjective:    Patient ID: [name], male    DOB: 1946/10/03, 74 y.o.   MRN: [id number]  HPI Pt returns for f/u of diabetes mellitus: DM type: Insulin-requiring type 2 Dx'ed: 3154 Complications: renal insuff and retinopathy.  Therapy: insulin since soon after dx.   DKA: never.  Severe hypoglycemia: never.   Pancreatitis: never.   SDOH: he cannot afford brand name meds.   Other: he is on a BID insulin schedule, due to noncompliance; he is retired.   Interval history: pt states cbg's vary from 55-400, but most are in the 100's.  It is in general higher as the day goes on.  He seldom has hypoglycemia, and these episodes are mild.  pt states he feels well in general.  He takes 160-170 units qam and 60 units qpm.   Past Medical History:  Diagnosis Date  . Adenomatous colon polyp 06/2001  . ALLERGIC RHINITIS 08/03/2007  . Allergy   . Asthma   . BENIGN PROSTATIC HYPERTROPHY 08/03/2007  . Chronic back pain   . COPD (chronic obstructive pulmonary disease) ([place])   . 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 Diabetes mellitus without complication ([place])    x 00-86 years as of 11/08/18   . DIABETIC RETINOPATHY, BACKGROUND 09/29/2008  . DYSPHAGIA UNSPECIFIED 09/29/2008  . 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 GERD 11/28/2008  . HYPERLIPIDEMIA 08/03/2007  . Kidney stones    Dr. Boneta Lucks  . 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 PAIN IN SOFT TISSUES OF LIMB 01/07/2008  . UNSPECIFIED ANEMIA 12/29/2007  . Unspecified essential hypertension 01/07/2008  . URINARY CALCULUS 09/29/2008  . Urine incontinence     Past Surgical History:  Procedure Laterality Date  . CHOLECYSTECTOMY N/A 11/12/2016   Procedure: LAPAROSCOPIC CHOLECYSTECTOMY WITH INTRAOPERATIVE CHOLANGIOGRAM;  Surgeon: Clayburn Pert, MD;  Location: [facility] ORS;  Service: General;  Laterality: N/A;  . COLONOSCOPY     polyp  . HAND SURGERY Right    2016  . LITHOTRIPSY    . SHOULDER SURGERY Left    left, cyst and tumor removed   . URINARY SURGERY  2018   Dr Daiva Eves "stretched" urethra around prostate to increase urine flow    Social History   Socioeconomic History  . Marital status: Married    Spouse name: Not on file  . Number of children: 2  . Years of education: Not on file  . Highest education level: Not on file  Occupational History  . Occupation: Metal Fabrication    Employer: [employer]  Tobacco Use  . Smoking status: Never Smoker  . Smokeless tobacco: Never Used  Vaping Use  . Vaping Use: Never used  Substance and Sexual Activity  . Alcohol use: Yes    Comment: occasional beer  . Drug use: No  . Sexual activity: Not on file  Other Topics Concern  . 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 Difficulty of Paying Living Expenses: Not hard at all  Food Insecurity: No Food Insecurity  . Worried About Charity fundraiser in the Last Year: Never true  . Ran Out of Food in the Last Year: Never true  Transportation Needs: No Transportation Needs  . Lack  of Transportation (Medical): No  . Lack of Transportation (Non-Medical): No  Physical Activity: Insufficiently Active  . Days of Exercise per Week: 1 day  . Minutes of Exercise per Session: 60 min  Stress: No Stress Concern Present  . Feeling of Stress : Not at all  Social Connections:   . Frequency of Communication with Friends and Family: Not on file  . Frequency of Social Gatherings with Friends and Family: Not on file  . Attends Religious Services: Not on file  . Active Member of Clubs or Organizations: Not on file  . Attends Archivist Meetings: Not on file  . Marital Status: Not on file  Intimate Partner Violence: Not At Risk  . Fear of Current or Ex-Partner: No  . Emotionally Abused: No  . Physically Abused: No  . Sexually  Abused: No    Current Outpatient Medications on File Prior to Visit  Medication Sig Dispense Refill  . albuterol (VENTOLIN HFA) 108 (90 Base) MCG/ACT inhaler Inhale 1-2 puffs into the lungs every 6 (six) hours as needed for wheezing or shortness of breath. 18 g 11  . amLODipine (NORVASC) 2.5 MG tablet Take 1 tablet (2.5 mg total) by mouth daily. 90 tablet 3  . aspirin 81 MG tablet Take 81 mg by mouth daily.    Marland Kitchen atorvastatin (LIPITOR) 20 MG tablet Take 20 mg by mouth daily at 6 PM.    . Cholecalciferol (VITAMIN D3) 125 MCG (5000 UT) TABS Take 1 tablet by mouth daily.     . cyanocobalamin (,VITAMIN B-12,) 1000 MCG/ML injection Inject 1 mL (1,000 mcg total) into the muscle every 30 (thirty) days. 4 mL 12  . diclofenac Sodium (VOLTAREN) 1 % GEL Apply 2 g topically 4 (four) times daily. Prn 100 g 11  . Fluocinolone Acetonide 0.01 % OIL Place 5 drops in ear(s) 2 (two) times daily as needed. X1- 2 weeks 20 mL 11  . Insulin Syringe-Needle U-100 (RELION INSULIN SYR 1CC/30G) 30G X 5/16" 1 ML MISC 1 each by Does not apply route 2 (two) times a day. Use to inject insulin BID; E11.9 100 each 2  . Lancets (ONETOUCH ULTRASOFT) lancets Use as instructed 2x daily Dx: 250.00 100 each 5  . losartan-hydrochlorothiazide (HYZAAR) 100-25 MG tablet Take 1 tablet by mouth daily. 90 tablet 3  . MAGNESIUM PO Take 1 tablet by mouth daily.     . metoprolol succinate (TOPROL-XL) 25 MG 24 hr tablet Take 1 tablet (25 mg total) by mouth daily. 90 tablet 3  . NON FORMULARY Prostate Peanut from [place].    Marland Kitchen omeprazole (PRILOSEC) 40 MG capsule Take 1 capsule (40 mg total) by mouth daily. 90 capsule 3  . ONETOUCH ULTRA test strip TEST TWICE A DAY 100 strip 9  . vitamin E 400 UNIT capsule Take 400 Units by mouth daily.     Current Facility-Administered Medications on File Prior to Visit  Medication Dose Route Frequency Provider Last Rate Last Admin  . cyanocobalamin ((VITAMIN B-12)) injection 1,000 mcg  1,000 mcg  Intramuscular Q30 days McLean-Scocuzza, Nino Glow, MD   1,000 mcg at 06/27/20 1038    Allergies  Allergen Reactions  . Sulfamethoxazole-Trimethoprim Rash    Family History  Problem Relation Age of Onset  . Diabetes Mother   . Depression Mother   . Prostate cancer Brother   . Diabetes Brother   . Heart disease Brother   . Diabetes Brother   . Colon cancer Neg Hx   .  Esophageal cancer Neg Hx   . Rectal cancer Neg Hx   . Stomach cancer Neg Hx     BP 138/70   Pulse 77   Ht 6' (1.829 m)   Wt 215 lb 6.4 oz (97.7 kg)   SpO2 98%   BMI 29.21 kg/m    Review of Systems He denies hypoglycemia.    Objective:   Physical Exam VITAL SIGNS:  See vs page GENERAL: no distress Pulses: dorsalis pedis intact bilat.   MSK: no deformity of the feet CV: no leg edema Skin:  no ulcer on the feet.  normal color and temp on the feet. Neuro: sensation is intact to touch on the feet.   Lab Results  Component Value Date   HGBA1C 7.3 (A) 07/24/2020   Lab Results  Component Value Date   CREATININE 1.44 05/29/2020   BUN 24 (H) 05/29/2020   NA 140 05/29/2020   K 4.3 05/29/2020   CL 101 05/29/2020   CO2 30 05/29/2020       Assessment & Plan:  Insulin-requiring type 2 DM, with stage 3 CRI Hypoglycemia, due to insulin: this limits aggressiveness of glycemic control   Patient Instructions  Please increase the insulin to 180 units with breakfast, and 40 units with supper. However, if you are going to be active, take just 120 units that morning.    check your blood sugar twice a day.  vary the time of day when you check, between before the 3 meals, and at bedtime.  also check if you have symptoms of your blood sugar being too high or too low.  please keep a record of the readings and bring it to your next appointment here (or you can bring the meter itself).  You can write it on any piece of paper.  please call us sooner if your blood sugar goes below 70, or if you have a lot of readings over  200.   Please come back for a follow-up appointment in 3-4 months.

## 2020-07-27 ENCOUNTER — Other Ambulatory Visit: Payer: Self-pay | Admitting: Endocrinology

## 2020-07-27 DIAGNOSIS — E11319 Type 2 diabetes mellitus with unspecified diabetic retinopathy without macular edema: Secondary | ICD-10-CM

## 2020-08-01 ENCOUNTER — Ambulatory Visit (INDEPENDENT_AMBULATORY_CARE_PROVIDER_SITE_OTHER): Payer: PPO

## 2020-08-01 ENCOUNTER — Other Ambulatory Visit: Payer: Self-pay

## 2020-08-01 DIAGNOSIS — E538 Deficiency of other specified B group vitamins: Secondary | ICD-10-CM | POA: Diagnosis not present

## 2020-08-01 DIAGNOSIS — Z23 Encounter for immunization: Secondary | ICD-10-CM | POA: Diagnosis not present

## 2020-08-01 NOTE — Progress Notes (Signed)
Patient presented for B 12 injection to right deltoid, patient voiced no concerns nor showed any signs of distress during injection. 

## 2020-08-17 ENCOUNTER — Telehealth: Payer: Self-pay | Admitting: Internal Medicine

## 2020-08-17 NOTE — Telephone Encounter (Signed)
Pt would like a GI referral for internal hemorrhoids. Please lvm if pt does not answer-

## 2020-08-17 NOTE — Telephone Encounter (Signed)
Pt established with GI no referral needed  Will have their office reach out to pt if c/w hemorrhoids  [place]

## 2020-08-17 NOTE — Telephone Encounter (Signed)
Please advise 

## 2020-08-18 NOTE — Telephone Encounter (Signed)
Please contact the patient and we can address his concern.

## 2020-08-21 NOTE — Telephone Encounter (Signed)
Left message for patient to return my call.

## 2020-08-22 NOTE — Telephone Encounter (Signed)
Left message for patient to return my call. Will await phone call back from patient.

## 2020-09-03 DIAGNOSIS — K649 Unspecified hemorrhoids: Secondary | ICD-10-CM | POA: Diagnosis not present

## 2020-09-03 DIAGNOSIS — R972 Elevated prostate specific antigen [PSA]: Secondary | ICD-10-CM | POA: Diagnosis not present

## 2020-09-03 DIAGNOSIS — N5201 Erectile dysfunction due to arterial insufficiency: Secondary | ICD-10-CM | POA: Diagnosis not present

## 2020-09-03 DIAGNOSIS — N401 Enlarged prostate with lower urinary tract symptoms: Secondary | ICD-10-CM | POA: Diagnosis not present

## 2020-09-03 DIAGNOSIS — N281 Cyst of kidney, acquired: Secondary | ICD-10-CM | POA: Diagnosis not present

## 2020-09-04 ENCOUNTER — Other Ambulatory Visit: Payer: Self-pay

## 2020-09-04 ENCOUNTER — Ambulatory Visit (INDEPENDENT_AMBULATORY_CARE_PROVIDER_SITE_OTHER): Payer: PPO

## 2020-09-04 VITALS — Ht 72.0 in | Wt 215.0 lb

## 2020-09-04 DIAGNOSIS — Z0000 Encounter for general adult medical examination without abnormal findings: Secondary | ICD-10-CM | POA: Diagnosis not present

## 2020-09-04 DIAGNOSIS — E538 Deficiency of other specified B group vitamins: Secondary | ICD-10-CM

## 2020-09-04 MED ORDER — CYANOCOBALAMIN 1000 MCG/ML IJ SOLN
1000.0000 ug | Freq: Once | INTRAMUSCULAR | Status: AC
  Administered 2020-09-04: 1000 ug via INTRAMUSCULAR

## 2020-09-04 NOTE — Progress Notes (Signed)
Subjective:   [name] is a 74 y.o. male who presents for Medicare Annual/Subsequent preventive examination.  Review of Systems    No ROS.  Medicare Wellness Virtual Visit.  Cardiac Risk Factors include: advanced age (>70men, >86 women);male gender;hypertension;diabetes mellitus     Objective:    Today's Vitals   09/04/20 0934  Weight: 215 lb (97.5 kg)  Height: 6' (1.829 m)   Body mass index is 29.16 kg/m.  Advanced Directives 09/04/2020 09/02/2019 08/31/2018 01/23/2017 11/11/2016 11/11/2016 07/09/2016  Does Patient Have a Medical Advance Directive? No No No No No No No  Would patient like information on creating a medical advance directive? No - Patient declined No - Patient declined - - No - Patient declined No - Patient declined -    Current Medications (verified) Outpatient Encounter Medications as of 09/04/2020  Medication Sig   albuterol (VENTOLIN HFA) 108 (90 Base) MCG/ACT inhaler Inhale 1-2 puffs into the lungs every 6 (six) hours as needed for wheezing or shortness of breath.   amLODipine (NORVASC) 2.5 MG tablet Take 1 tablet (2.5 mg total) by mouth daily.   aspirin 81 MG tablet Take 81 mg by mouth daily.   atorvastatin (LIPITOR) 20 MG tablet Take 20 mg by mouth daily at 6 PM.   Cholecalciferol (VITAMIN D3) 125 MCG (5000 UT) TABS Take 1 tablet by mouth daily.    cyanocobalamin (,VITAMIN B-12,) 1000 MCG/ML injection Inject 1 mL (1,000 mcg total) into the muscle every 30 (thirty) days.   diclofenac Sodium (VOLTAREN) 1 % GEL Apply 2 g topically 4 (four) times daily. Prn   Fluocinolone Acetonide 0.01 % OIL Place 5 drops in ear(s) 2 (two) times daily as needed. X1- 2 weeks   insulin NPH-regular Human (NOVOLIN 70/30 RELION) (70-30) 100 UNIT/ML injection INJECT 180 UNITS UNDER THE SKIN WITH BREAKFAST AND 40 UNITS WITH SUPPER   Insulin Syringe-Needle U-100 (RELION INSULIN SYR 1CC/30G) 30G X 5/16" 1 ML MISC 1 each by Does not apply route 2 (two) times a day. Use to  inject insulin BID; E11.9   Lancets (ONETOUCH ULTRASOFT) lancets Use as instructed 2x daily Dx: 250.00   losartan-hydrochlorothiazide (HYZAAR) 100-25 MG tablet Take 1 tablet by mouth daily.   MAGNESIUM PO Take 1 tablet by mouth daily.    metoprolol succinate (TOPROL-XL) 25 MG 24 hr tablet Take 1 tablet (25 mg total) by mouth daily.   NON FORMULARY Prostate Peanut from [place].   omeprazole (PRILOSEC) 40 MG capsule Take 1 capsule (40 mg total) by mouth daily.   ONETOUCH ULTRA test strip TEST TWICE A DAY   vitamin E 400 UNIT capsule Take 400 Units by mouth daily.   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  PAIN IN SOFT TISSUES OF LIMB 01/07/2008   UNSPECIFIED ANEMIA 12/29/2007   Unspecified essential hypertension 01/07/2008   URINARY CALCULUS 09/29/2008   Urine incontinence    Past Surgical History:  Procedure Laterality Date   CHOLECYSTECTOMY N/A 11/12/2016   Procedure: LAPAROSCOPIC CHOLECYSTECTOMY WITH INTRAOPERATIVE CHOLANGIOGRAM;  Surgeon: Clayburn Pert, MD;  Location: [facility] ORS;  Service: General;  Laterality: N/A;   COLONOSCOPY     polyp   HAND SURGERY Right    2016   LITHOTRIPSY      SHOULDER SURGERY Left    left, cyst and tumor removed   URINARY SURGERY  2018   Dr Daiva Eves "stretched" urethra around prostate to increase urine flow   Family History  Problem Relation Age of Onset   Diabetes Mother    Depression Mother    Prostate cancer Brother    Diabetes Brother    Heart disease Brother    Diabetes Brother    Colon cancer Neg Hx    Esophageal cancer Neg Hx    Rectal cancer Neg Hx    Stomach cancer Neg Hx    Social History   Socioeconomic History   Marital status: Married    Spouse name: Not on file   Number of children: 2   Years of education: Not on file   Highest education level: Not on file  Occupational History   Occupation: Metal Fabrication    Employer: [employer]  Tobacco Use   Smoking status: Never Smoker   Smokeless tobacco: Never Used  Scientific laboratory technician Use: Never used  Substance and Sexual Activity   Alcohol use: Yes    Comment: occasional beer   Drug use: No   Sexual activity: Not on file  Other Topics Concern   Not on file  Social History Narrative   Daily Caffeine Use 1-2 daily   Married    Never smoker    Wears seat belt, safe in relationship    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, retired    Investment banker, operational of Radio broadcast assistant Strain: Low Risk    Difficulty of Paying Living Expenses: Not hard at all  Food Insecurity: No Food Insecurity   Worried About Charity fundraiser in the Last Year: Never true   Arboriculturist in the Last Year: Never true  Transportation Needs: No Transportation Needs   Lack of Transportation (Medical): No   Lack of Transportation (Non-Medical): No  Physical Activity:    Days of Exercise per Week: Not on file   Minutes of Exercise per Session: Not on file  Stress: No Stress Concern Present   Feeling of Stress : Not at all  Social Connections: Unknown   Frequency of Communication with Friends and Family: Not on file   Frequency of Social Gatherings with Friends and  Family: Not on file   Attends Religious Services: Not on Electrical engineer or Organizations: Not on file   Attends Archivist Meetings: Not on file   Marital Status: Married    Tobacco Counseling Counseling given: Not Answered   Clinical Intake:  Pre-visit preparation completed: Yes        Diabetes: Yes (Followed by pcp)  How often do you need to have someone help you when you read instructions, pamphlets, or other written materials from your doctor or pharmacy?: 1 - Never  Interpreter Needed?: No      Activities of Daily Living In your present state of  health, do you have any difficulty performing the following activities: 09/04/2020  Vision? N  Difficulty concentrating or making decisions? N  Walking or climbing stairs? N  Dressing or bathing? N  Doing errands, shopping? N  Preparing Food and eating ? N  Using the Toilet? N  In the past six months, have you accidently leaked urine? N  Do you have problems with loss of bowel control? N  Managing your Medications? N  Managing your Finances? N  Housekeeping or managing your Housekeeping? N  Some recent data might be hidden    Patient Care Team: McLean-Scocuzza, Nino Glow, MD as PCP - General (Internal Medicine)  Indicate any recent Medical Services you may have received from other than [facility] providers in the past year (date may be approximate).     Assessment:   This is a routine wellness examination for Millie.  I connected with Merrik today by telephone and verified that I am speaking with the correct person using two identifiers. Location patient: home Location provider: work Persons participating in the virtual visit: patient, Marine scientist.    I discussed the limitations, risks, security and privacy concerns of performing an evaluation and management service by telephone and the availability of in person appointments. The patient expressed understanding and verbally consented to this  telephonic visit.    Interactive audio and video telecommunications were attempted between this provider and patient, however failed, due to patient having technical difficulties OR patient did not have access to video capability.  We continued and completed visit with audio only.  Some vital signs may be absent or patient reported.   Hearing/Vision screen  Hearing Screening   125Hz  250Hz  500Hz  1000Hz  2000Hz  3000Hz  4000Hz  6000Hz  8000Hz   Right ear:           Left ear:           Comments: Difficulty hearing conversational tones.  Followed by [facility].  Hearing aids- refused.  Vision Screening Comments:  Wears corrective lenses  Visual acuity not assessed, virtual visit.  Retinopathy reported  They have seen their ophthalmologist in the last 12 months  Dietary issues and exercise activities discussed: Current Exercise Habits: Home exercise routine, Intensity: Mild  Healthy diet Fair water intake; plans to add more  Goals      Patient Stated     DIET - INCREASE WATER INTAKE (pt-stated)      I need to drink 24 more ounces       I want to get my body and mind moving more (pt-stated)      Walk for exercise Brain engagement activities      Depression Screen PHQ 2/9 Scores 09/04/2020 05/24/2020 01/20/2020 09/02/2019 07/28/2019 03/30/2019 07/09/2016  PHQ - 2 Score 0 0 0 0 0 1 0    Fall Risk Fall Risk  09/04/2020 05/24/2020 01/20/2020 09/02/2019 07/28/2019  Falls in the past year? 0 0 0 0 0  Number falls in past yr: 0 0 0 - -  Injury with Fall? - 0 0 - -  Follow up Falls evaluation completed Falls evaluation completed Falls evaluation completed - -   Handrails in use when climbing stairs? Yes Home free of loose throw rugs in walkways, pet beds, electrical cords, etc? Yes  Adequate lighting in your home to reduce risk of falls? Yes   ASSISTIVE DEVICES UTILIZED TO PREVENT FALLS: Use of a cane, walker or w/c? No   TIMED UP AND GO: Was the test performed? No . Virtual visit.  Cognitive  Function:  Patient is alert and oriented x3.    6CIT Screen 09/04/2020 09/02/2019  What Year? 0 points 0 points  What month? 0 points 0 points  What time? 0 points 0 points  Count back from 20 - 0 points  Months in reverse - 0 points  Repeat phrase - 0 points  Total Score - 0   Immunizations Immunization History  Administered Date(s) Administered   Fluad Quad(high Dose 65+) 07/27/2019, 08/01/2020   Influenza Whole 08/24/2009, 08/25/2011   Influenza, High Dose Seasonal PF 08/21/2016, 08/25/2017, 08/31/2018   Influenza,inj,Quad PF,6+ Mos 10/29/2015   Influenza-Unspecified 10/29/2015   Moderna SARS-COVID-2 Vaccination 01/05/2020, 02/02/2020   Pneumococcal Conjugate-13 01/23/2017   Pneumococcal Polysaccharide-23 09/25/2003, 03/27/2015   Td 09/25/2003   Tdap 08/31/2018    Health Maintenance There are no preventive care reminders to display for this patient. Health Maintenance  Topic Date Due   HEMOGLOBIN A1C  01/21/2021   FOOT EXAM  05/24/2021   OPHTHALMOLOGY EXAM  07/27/2021   COLONOSCOPY  01/23/2022   TETANUS/TDAP  08/31/2028   INFLUENZA VACCINE  Completed   COVID-19 Vaccine  Completed   Hepatitis C Screening  Completed   PNA vac Low Risk Adult  Completed   Dental Screening: Recommended annual dental exams for proper oral hygiene.  Community Resource Referral / Chronic Care Management: CRR required this visit?  No   CCM required this visit?  No      Plan:   Keep all routine maintenance appointments.   Nurse visit  today at 11:30 B12 injection  Follow up 09/25/20 @ 2:00  I have personally reviewed and noted the following in the patients chart:    Medical and social history  Use of alcohol, tobacco or illicit drugs   Current medications and supplements  Functional ability and status  Nutritional status  Physical activity  Advanced directives  List of other physicians  Hospitalizations, surgeries, and ER visits in previous 12  months  Vitals  Screenings to include cognitive, depression, and falls  Referrals and appointments  In addition, I have reviewed and discussed with patient certain preventive protocols, quality metrics, and best practice recommendations. A written personalized care plan for preventive services as well as general preventive health recommendations were provided to patient via mychart.     Varney Biles, LPN   [id number]

## 2020-09-04 NOTE — Patient Instructions (Addendum)
[name] , Thank you for taking time to come for your Medicare Wellness Visit. I appreciate your ongoing commitment to your health goals. Please review the following plan we discussed and let me know if I can assist you in the future.   These are the goals we discussed: Goals      Patient Stated   .  DIET - INCREASE WATER INTAKE (pt-stated)      I need to drink 24 more ounces     .  I want to get my body and mind moving more (pt-stated)      Walk for exercise Brain engagement activities       This is a list of the screening recommended for you and due dates:  Health Maintenance  Topic Date Due  . Hemoglobin A1C  01/21/2021  . Complete foot exam   05/24/2021  . Eye exam for diabetics  07/27/2021  . Colon Cancer Screening  01/23/2022  . Tetanus Vaccine  08/31/2028  . Flu Shot  Completed  . COVID-19 Vaccine  Completed  .  Hepatitis C: One time screening is recommended by Center for Disease Control  (CDC) for  adults born from [birth year] through 1963.   Completed  . Pneumonia vaccines  Completed    Immunizations Immunization History  Administered Date(s) Administered  . Fluad Quad(high Dose 65+) 07/27/2019, 08/01/2020  . Influenza Whole 08/24/2009, 08/25/2011  . Influenza, High Dose Seasonal PF 08/21/2016, 08/25/2017, 08/31/2018  . Influenza,inj,Quad PF,6+ Mos 10/29/2015  . Influenza-Unspecified 10/29/2015  . Moderna SARS-COVID-2 Vaccination 01/05/2020, 02/02/2020  . Pneumococcal Conjugate-13 01/23/2017  . Pneumococcal Polysaccharide-23 09/25/2003, 03/27/2015  . Td 09/25/2003  . Tdap 08/31/2018   Keep all routine maintenance appointments.   Nurse visit  today ay 11:30 B12 injection  Follow up 09/25/20 @ 2:00  Advanced directives: not yet completed  Follow up in one year for your annual wellness visit.   Preventive Care 11 Years and Older, Male Preventive care refers to lifestyle choices and visits with your health care provider that can promote health and wellness. What  does preventive care include?  A yearly physical exam. This is also called an annual well check.  Dental exams once or twice a year.  Routine eye exams. Ask your health care provider how often you should have your eyes checked.  Personal lifestyle choices, including:  Daily care of your teeth and gums.  Regular physical activity.  Eating a healthy diet.  Avoiding tobacco and drug use.  Limiting alcohol use.  Practicing safe sex.  Taking low doses of aspirin every day.  Taking vitamin and mineral supplements as recommended by your health care provider. What happens during an annual well check? The services and screenings done by your health care provider during your annual well check will depend on your age, overall health, lifestyle risk factors, and family history of disease. Counseling  Your health care provider may ask you questions about your:  Alcohol use.  Tobacco use.  Drug use.  Emotional well-being.  Home and relationship well-being.  Sexual activity.  Eating habits.  History of falls.  Memory and ability to understand (cognition).  Work and work Statistician. Screening  You may have the following tests or measurements:  Height, weight, and BMI.  Blood pressure.  Lipid and cholesterol levels. These may be checked every 5 years, or more frequently if you are over 20 years old.  Skin check.  Lung cancer screening. You may have this screening every year starting at age  55 if you have a 30-pack-year history of smoking and currently smoke or have quit within the past 15 years.  Fecal occult blood test (FOBT) of the stool. You may have this test every year starting at age 28.  Flexible sigmoidoscopy or colonoscopy. You may have a sigmoidoscopy every 5 years or a colonoscopy every 10 years starting at age 16.  Prostate cancer screening. Recommendations will vary depending on your family history and other risks.  Hepatitis C blood test.  Hepatitis  B blood test.  Sexually transmitted disease (STD) testing.  Diabetes screening. This is done by checking your blood sugar (glucose) after you have not eaten for a while (fasting). You may have this done every 1-3 years.  Abdominal aortic aneurysm (AAA) screening. You may need this if you are a current or former smoker.  Osteoporosis. You may be screened starting at age 40 if you are at high risk. Talk with your health care provider about your test results, treatment options, and if necessary, the need for more tests. Vaccines  Your health care provider may recommend certain vaccines, such as:  Influenza vaccine. This is recommended every year.  Tetanus, diphtheria, and acellular pertussis (Tdap, Td) vaccine. You may need a Td booster every 10 years.  Zoster vaccine. You may need this after age 51.  Pneumococcal 13-valent conjugate (PCV13) vaccine. One dose is recommended after age 26.  Pneumococcal polysaccharide (PPSV23) vaccine. One dose is recommended after age 21. Talk to your health care provider about which screenings and vaccines you need and how often you need them. This information is not intended to replace advice given to you by your health care provider. Make sure you discuss any questions you have with your health care provider. Document Released: 12/07/2015 Document Revised: 07/30/2016 Document Reviewed: 09/11/2015 Elsevier Interactive Patient Education  2017 [place] Prevention in the Home Falls can cause injuries. They can happen to people of all ages. There are many things you can do to make your home safe and to help prevent falls. What can I do on the outside of my home?  Regularly fix the edges of walkways and driveways and fix any cracks.  Remove anything that might make you trip as you walk through a door, such as a raised step or threshold.  Trim any bushes or trees on the path to your home.  Use bright outdoor lighting.  Clear any walking  paths of anything that might make someone trip, such as rocks or tools.  Regularly check to see if handrails are loose or broken. Make sure that both sides of any steps have handrails.  Any raised decks and porches should have guardrails on the edges.  Have any leaves, snow, or ice cleared regularly.  Use sand or salt on walking paths during winter.  Clean up any spills in your garage right away. This includes oil or grease spills. What can I do in the bathroom?  Use night lights.  Install grab bars by the toilet and in the tub and shower. Do not use towel bars as grab bars.  Use non-skid mats or decals in the tub or shower.  If you need to sit down in the shower, use a plastic, non-slip stool.  Keep the floor dry. Clean up any water that spills on the floor as soon as it happens.  Remove soap buildup in the tub or shower regularly.  Attach bath mats securely with double-sided non-slip rug tape.  Do not have throw  rugs and other things on the floor that can make you trip. What can I do in the bedroom?  Use night lights.  Make sure that you have a light by your bed that is easy to reach.  Do not use any sheets or blankets that are too big for your bed. They should not hang down onto the floor.  Have a firm chair that has side arms. You can use this for support while you get dressed.  Do not have throw rugs and other things on the floor that can make you trip. What can I do in the kitchen?  Clean up any spills right away.  Avoid walking on wet floors.  Keep items that you use a lot in easy-to-reach places.  If you need to reach something above you, use a strong step stool that has a grab bar.  Keep electrical cords out of the way.  Do not use floor polish or wax that makes floors slippery. If you must use wax, use non-skid floor wax.  Do not have throw rugs and other things on the floor that can make you trip. What can I do with my stairs?  Do not leave any items  on the stairs.  Make sure that there are handrails on both sides of the stairs and use them. Fix handrails that are broken or loose. Make sure that handrails are as long as the stairways.  Check any carpeting to make sure that it is firmly attached to the stairs. Fix any carpet that is loose or worn.  Avoid having throw rugs at the top or bottom of the stairs. If you do have throw rugs, attach them to the floor with carpet tape.  Make sure that you have a light switch at the top of the stairs and the bottom of the stairs. If you do not have them, ask someone to add them for you. What else can I do to help prevent falls?  Wear shoes that:  Do not have high heels.  Have rubber bottoms.  Are comfortable and fit you well.  Are closed at the toe. Do not wear sandals.  If you use a stepladder:  Make sure that it is fully opened. Do not climb a closed stepladder.  Make sure that both sides of the stepladder are locked into place.  Ask someone to hold it for you, if possible.  Clearly mark and make sure that you can see:  Any grab bars or handrails.  First and last steps.  Where the edge of each step is.  Use tools that help you move around (mobility aids) if they are needed. These include:  Canes.  Walkers.  Scooters.  Crutches.  Turn on the lights when you go into a dark area. Replace any light bulbs as soon as they burn out.  Set up your furniture so you have a clear path. Avoid moving your furniture around.  If any of your floors are uneven, fix them.  If there are any pets around you, be aware of where they are.  Review your medicines with your doctor. Some medicines can make you feel dizzy. This can increase your chance of falling. Ask your doctor what other things that you can do to help prevent falls. This information is not intended to replace advice given to you by your health care provider. Make sure you discuss any questions you have with your health care  provider. Document Released: 09/06/2009 Document Revised: 04/17/2016 Document Reviewed: 12/15/2014  Chartered certified accountant Patient Education  [employer].

## 2020-09-04 NOTE — Progress Notes (Signed)
Patient presented for B 12 injection to left deltoid, patient voiced no concerns nor showed any signs of distress during injection. 

## 2020-09-06 ENCOUNTER — Other Ambulatory Visit: Payer: Self-pay

## 2020-09-06 MED ORDER — OMEPRAZOLE 40 MG PO CPDR
40.0000 mg | DELAYED_RELEASE_CAPSULE | Freq: Every day | ORAL | 3 refills | Status: DC
Start: 2020-09-06 — End: 2021-04-16

## 2020-09-24 ENCOUNTER — Other Ambulatory Visit: Payer: Self-pay

## 2020-09-25 ENCOUNTER — Ambulatory Visit (INDEPENDENT_AMBULATORY_CARE_PROVIDER_SITE_OTHER): Payer: PPO | Admitting: Internal Medicine

## 2020-09-25 ENCOUNTER — Encounter: Payer: Self-pay | Admitting: Internal Medicine

## 2020-09-25 ENCOUNTER — Other Ambulatory Visit: Payer: Self-pay

## 2020-09-25 VITALS — BP 122/82 | HR 75 | Temp 97.7°F | Ht 72.0 in | Wt 222.4 lb

## 2020-09-25 DIAGNOSIS — L918 Other hypertrophic disorders of the skin: Secondary | ICD-10-CM | POA: Diagnosis not present

## 2020-09-25 DIAGNOSIS — I10 Essential (primary) hypertension: Secondary | ICD-10-CM

## 2020-09-25 DIAGNOSIS — E785 Hyperlipidemia, unspecified: Secondary | ICD-10-CM

## 2020-09-25 DIAGNOSIS — J321 Chronic frontal sinusitis: Secondary | ICD-10-CM | POA: Diagnosis not present

## 2020-09-25 DIAGNOSIS — H9193 Unspecified hearing loss, bilateral: Secondary | ICD-10-CM | POA: Diagnosis not present

## 2020-09-25 DIAGNOSIS — Z1283 Encounter for screening for malignant neoplasm of skin: Secondary | ICD-10-CM

## 2020-09-25 DIAGNOSIS — H6123 Impacted cerumen, bilateral: Secondary | ICD-10-CM

## 2020-09-25 DIAGNOSIS — R7989 Other specified abnormal findings of blood chemistry: Secondary | ICD-10-CM

## 2020-09-25 DIAGNOSIS — K649 Unspecified hemorrhoids: Secondary | ICD-10-CM | POA: Diagnosis not present

## 2020-09-25 MED ORDER — ATORVASTATIN CALCIUM 20 MG PO TABS: 20.0000 mg | ORAL_TABLET | Freq: Every day | ORAL | 3 refills | Status: DC

## 2020-09-25 NOTE — Patient Instructions (Addendum)
Senna and colace (combination pill over the counter) for constipation  Or  Miralax and colace  Or  Colace alone=stool softner     Dr. Ayesha Mohair (ENT) [place] [facility]   [place] [facility] [place] [facility] [place]   At least 55 ounces water daily BPA free plastic bottles bottled water or glass     Hemorrhoids Hemorrhoids are swollen veins in and around the rectum or anus. There are two types of hemorrhoids:  Internal hemorrhoids. These occur in the veins that are just inside the rectum. They may poke through to the outside and become irritated and painful.  External hemorrhoids. These occur in the veins that are outside the anus and can be felt as a painful swelling or hard lump near the anus. Most hemorrhoids do not cause serious problems, and they can be managed with home treatments such as diet and lifestyle changes. If home treatments do not help the symptoms, procedures can be done to shrink or remove the hemorrhoids. What are the causes? This condition is caused by increased pressure in the anal area. This pressure may result from various things, including:  Constipation.  Straining to have a bowel movement.  Diarrhea.  Pregnancy.  Obesity.  Sitting for long periods of time.  Heavy lifting or other activity that causes you to strain.  Anal sex.  Riding a bike for a long period of time. What are the signs or symptoms? Symptoms of this condition include:  Pain.  Anal itching or irritation.  Rectal bleeding.  Leakage of stool (feces).  Anal swelling.  One or more lumps around the anus. How is this diagnosed? This condition can often be diagnosed through a visual exam. Other exams or tests may also be done, such as:  An exam that involves feeling the rectal area with a gloved hand (digital rectal exam).  An exam of the anal canal that is done using a small tube (anoscope).  A blood test, if you have lost a significant amount of blood.  A test to look inside  the colon using a flexible tube with a camera on the end (sigmoidoscopy or colonoscopy). How is this treated? This condition can usually be treated at home. However, various procedures may be done if dietary changes, lifestyle changes, and other home treatments do not help your symptoms. These procedures can help make the hemorrhoids smaller or remove them completely. Some of these procedures involve surgery, and others do not. Common procedures include:  Rubber band ligation. Rubber bands are placed at the base of the hemorrhoids to cut off their blood supply.  Sclerotherapy. Medicine is injected into the hemorrhoids to shrink them.  Infrared coagulation. A type of light energy is used to get rid of the hemorrhoids.  Hemorrhoidectomy surgery. The hemorrhoids are surgically removed, and the veins that supply them are tied off.  Stapled hemorrhoidopexy surgery. The surgeon staples the base of the hemorrhoid to the rectal wall. Follow these instructions at home: Eating and drinking   Eat foods that have a lot of fiber in them, such as whole grains, beans, nuts, fruits, and vegetables.  Ask your health care provider about taking products that have added fiber (fiber supplements).  Reduce the amount of fat in your diet. You can do this by eating low-fat dairy products, eating less red meat, and avoiding processed foods.  Drink enough fluid to keep your urine pale yellow. Managing pain and swelling   Take warm sitz baths for 20 minutes, 3-4 times a day to  ease pain and discomfort. You may do this in a bathtub or using a portable sitz bath that fits over the toilet.  If directed, apply ice to the affected area. Using ice packs between sitz baths may be helpful. ? Put ice in a plastic bag. ? Place a towel between your skin and the bag. ? Leave the ice on for 20 minutes, 2-3 times a day. General instructions  Take over-the-counter and prescription medicines only as told by your health care  provider.  Use medicated creams or suppositories as told.  Get regular exercise. Ask your health care provider how much and what kind of exercise is best for you. In general, you should do moderate exercise for at least 30 minutes on most days of the week (150 minutes each week). This can include activities such as walking, biking, or yoga.  Go to the bathroom when you have the urge to have a bowel movement. Do not wait.  Avoid straining to have bowel movements.  Keep the anal area dry and clean. Use wet toilet paper or moist towelettes after a bowel movement.  Do not sit on the toilet for long periods of time. This increases blood pooling and pain.  Keep all follow-up visits as told by your health care provider. This is important. Contact a health care provider if you have:  Increasing pain and swelling that are not controlled by treatment or medicine.  Difficulty having a bowel movement, or you are unable to have a bowel movement.  Pain or inflammation outside the area of the hemorrhoids. Get help right away if you have:  Uncontrolled bleeding from your rectum. Summary  Hemorrhoids are swollen veins in and around the rectum or anus.  Most hemorrhoids can be managed with home treatments such as diet and lifestyle changes.  Taking warm sitz baths can help ease pain and discomfort.  In severe cases, procedures or surgery can be done to shrink or remove the hemorrhoids. This information is not intended to replace advice given to you by your health care provider. Make sure you discuss any questions you have with your health care provider. Document Revised: 04/08/2019 Document Reviewed: 04/01/2018 Elsevier Patient Education  [place].

## 2020-09-25 NOTE — Progress Notes (Addendum)
Chief Complaint  Patient presents with  . Follow-up   F/u  1. HTN controlled norvasc 2.5 mg qd, losartan 100-25 mg qd toprol 25 mg qd  2. Shoulder pain  3. Hemorrhoids internal appt GI next week  4. Chronic sinus issues was on allergy shots by allergy, c/o hearing loss as well wants sinus issues checked refer Dr. Kathyrn Sheriff  Review of Systems  Constitutional: Negative for weight loss.  HENT: Positive for hearing loss and sinus pain.        +ear wax   Eyes: Negative for blurred vision.  Respiratory: Negative for shortness of breath.   Cardiovascular: Negative for chest pain.  Gastrointestinal: Positive for abdominal pain.  Skin: Negative for rash.  Neurological: Negative for headaches.  Psychiatric/Behavioral: Negative for memory loss.   Past Medical History:  Diagnosis Date  . Adenomatous colon polyp 06/2001  . ALLERGIC RHINITIS 08/03/2007  . Allergy   . Asthma   . BENIGN PROSTATIC HYPERTROPHY 08/03/2007  . Chronic back pain   . COPD (chronic obstructive pulmonary disease) ([place])   . 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 Diabetes mellitus without complication ([place])    x 26-41 years as of 11/08/18   . DIABETIC RETINOPATHY, BACKGROUND 09/29/2008  . DYSPHAGIA UNSPECIFIED 09/29/2008  . 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 GERD 11/28/2008  . HYPERLIPIDEMIA 08/03/2007  . Kidney stones    Dr. Boneta Lucks  . 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 PAIN IN SOFT TISSUES OF LIMB 01/07/2008  . UNSPECIFIED ANEMIA 12/29/2007  . Unspecified essential hypertension 01/07/2008  . URINARY CALCULUS 09/29/2008  . Urine incontinence    Past Surgical History:  Procedure Laterality Date  . CHOLECYSTECTOMY N/A 11/12/2016   Procedure: LAPAROSCOPIC CHOLECYSTECTOMY WITH INTRAOPERATIVE CHOLANGIOGRAM;  Surgeon: Clayburn Pert, MD;  Location: [facility] ORS;  Service: General;  Laterality: N/A;  . COLONOSCOPY     polyp  . HAND SURGERY Right    2016  . LITHOTRIPSY    . SHOULDER SURGERY Left    left,  cyst and tumor removed  . URINARY SURGERY  2018   Dr Daiva Eves "stretched" urethra around prostate to increase urine flow   Family History  Problem Relation Age of Onset  . Diabetes Mother   . Depression Mother   . Prostate cancer Brother   . Diabetes Brother   . Heart disease Brother   . Diabetes Brother   . Colon cancer Neg Hx   . Esophageal cancer Neg Hx   . Rectal cancer Neg Hx   . Stomach cancer Neg Hx    Social History   Socioeconomic History  . Marital status: Married    Spouse name: Not on file  . Number of children: 2  . Years of education: Not on file  . Highest education level: Not on file  Occupational History  . Occupation: Metal Fabrication    Employer: [employer]  Tobacco Use  . Smoking status: Never Smoker  . Smokeless tobacco: Never Used  Vaping Use  . Vaping Use: Never used  Substance and Sexual Activity  . Alcohol use: Yes    Comment: occasional beer  . Drug use: No  . Sexual activity: Not on file  Other Topics Concern  . 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 Difficulty of Paying Living Expenses: Not hard at all  Food Insecurity: No Food Insecurity  . Worried About Charity fundraiser in the Last Year: Never true  . Ran Out of Food in the Last Year: Never true  Transportation Needs: No Transportation Needs  . Lack of Transportation (Medical): No  . Lack of Transportation (Non-Medical): No  Physical Activity:   . Days of Exercise per Week: Not on file  . Minutes of Exercise per Session: Not on file  Stress: No Stress Concern Present  . Feeling of Stress : Not at all  Social Connections: Unknown  . Frequency of Communication with Friends and Family: Not on file  . Frequency of Social Gatherings with Friends and Family: Not on file  . Attends  Religious Services: Not on file  . Active Member of Clubs or Organizations: Not on file  . Attends Archivist Meetings: Not on file  . Marital Status: Married  Human resources officer Violence: Not At Risk  . Fear of Current or Ex-Partner: No  . Emotionally Abused: No  . Physically Abused: No  . Sexually Abused: No   Current Meds  Medication Sig  . albuterol (VENTOLIN HFA) 108 (90 Base) MCG/ACT inhaler Inhale 1-2 puffs into the lungs every 6 (six) hours as needed for wheezing or shortness of breath.  Marland Kitchen amLODipine (NORVASC) 2.5 MG tablet Take 1 tablet (2.5 mg total) by mouth daily.  Marland Kitchen aspirin 81 MG tablet Take 81 mg by mouth daily.  Marland Kitchen atorvastatin (LIPITOR) 20 MG tablet Take 1 tablet (20 mg total) by mouth daily at 6 PM.  . Cholecalciferol (VITAMIN D3) 125 MCG (5000 UT) TABS Take 1 tablet by mouth daily.   . cyanocobalamin (,VITAMIN B-12,) 1000 MCG/ML injection Inject 1 mL (1,000 mcg total) into the muscle every 30 (thirty) days.  . diclofenac Sodium (VOLTAREN) 1 % GEL Apply 2 g topically 4 (four) times daily. Prn  . Fluocinolone Acetonide 0.01 % OIL Place 5 drops in ear(s) 2 (two) times daily as needed. X1- 2 weeks  . insulin NPH-regular Human (NOVOLIN 70/30 RELION) (70-30) 100 UNIT/ML injection INJECT 180 UNITS UNDER THE SKIN WITH BREAKFAST AND 40 UNITS WITH SUPPER  . Insulin Syringe-Needle U-100 (RELION INSULIN SYR 1CC/30G) 30G X 5/16" 1 ML MISC 1 each by Does not apply route 2 (two) times a day. Use to inject insulin BID; E11.9  . Lancets (ONETOUCH ULTRASOFT) lancets Use as instructed 2x daily Dx: 250.00  . losartan-hydrochlorothiazide (HYZAAR) 100-25 MG tablet Take 1 tablet by mouth daily.  Marland Kitchen MAGNESIUM PO Take 1 tablet by mouth daily.   . metoprolol succinate (TOPROL-XL) 25 MG 24 hr tablet Take 1 tablet (25 mg total) by mouth daily.  . NON FORMULARY Prostate Peanut from [place].  Marland Kitchen omeprazole (PRILOSEC) 40 MG capsule Take 1 capsule (40 mg total) by mouth daily.  Glory Rosebush  ULTRA test strip TEST TWICE A DAY  . tamsulosin (FLOMAX) 0.4 MG CAPS capsule Take 0.4 mg by mouth daily.  . vitamin E 400 UNIT capsule Take 400 Units by mouth daily.  . [DISCONTINUED] atorvastatin (LIPITOR) 20 MG tablet Take 20 mg by mouth daily at 6 PM.   Current Facility-Administered Medications for the 09/25/20 encounter (Office Visit) with McLean-Scocuzza, Nino Glow, MD  Medication  . cyanocobalamin ((VITAMIN B-12)) injection 1,000 mcg   Allergies  Allergen Reactions  . Sulfamethoxazole-Trimethoprim Rash   Recent Results (from the past 2160 hour(s))  POCT HgB A1C     Status: Abnormal   Collection Time: 07/24/20 10:28 AM  Result Value Ref Range   Hemoglobin A1C 7.3 (A) 4.0 - 5.6 %   HbA1c POC (<> result, manual entry)     HbA1c, POC (prediabetic range)     HbA1c, POC (controlled diabetic range)     Objective  Body mass index is 30.16 kg/m. Wt Readings from Last 3 Encounters:  09/25/20 222 lb 6.4 oz (100.9 kg)  09/04/20 215 lb (97.5 kg)  07/24/20 215 lb 6.4 oz (97.7 kg)   Temp Readings from Last 3 Encounters:  09/25/20 97.7 F (36.5 C) (Oral)  05/24/20 97.6 F (36.4 C) (Oral)  01/20/20 (!) 97.5 F (36.4 C) (Temporal)   BP Readings from Last 3 Encounters:  09/25/20 122/82  07/24/20 138/70  05/24/20 126/78   Pulse Readings from Last 3 Encounters:  09/25/20 75  07/24/20 77  05/24/20 64    Physical Exam Vitals and nursing note reviewed.  Constitutional:      Appearance: Normal appearance. He is well-developed and well-groomed. He is obese.  HENT:     Head: Normocephalic and atraumatic.     Right Ear: There is impacted cerumen.     Left Ear: There is impacted cerumen.     Mouth/Throat:     Mouth: Mucous membranes are moist.     Pharynx: Oropharynx is clear.  Cardiovascular:     Rate and Rhythm: Normal rate and regular rhythm.     Heart sounds: Normal heart sounds. No murmur heard.   Pulmonary:     Effort: Pulmonary effort is normal.     Breath sounds:  Normal breath sounds.  Skin:    General: Skin is warm and dry.  Neurological:     General: No focal deficit present.     Mental Status: He is alert and oriented to person, place, and time. Mental status is at baseline.  Psychiatric:        Attention and Perception: Attention and perception normal.        Mood and Affect: Mood and affect normal.        Speech: Speech normal.        Behavior: Behavior normal. Behavior is cooperative.        Thought Content: Thought content normal.        Cognition and Memory: Cognition and memory normal.        Judgment: Judgment normal.     Assessment  Plan  Hypertension, controlled - Plan: Comprehensive metabolic panel  norvasc 2.5 mg qd, losartan 100-25 mg qd toprol 25 mg qd  Controlled continue   Frontal sinusitis, unspecified chronicity - Plan: Ambulatory referral to ENT [place]   Skin cancer screening - Plan: Ambulatory referral to Dermatology Skin tag - Plan: Ambulatory referral to Dermatology  Hemorrhoids, unspecified hemorrhoid type F/u [facility] next week   Hyperlipidemia, unspecified hyperlipidemia type - Plan: atorvastatin (LIPITOR) 20 MG tablet  Bilateral impacted cerumen - Plan: Ambulatory referral to ENT Consented and tolerated ear flush all wax removed. Pt tolerated   Bilateral hearing loss, unspecified hearing loss type - Plan: Ambulatory referral to ENT  Elevated serum creatinine - Plan: Comprehensive metabolic panel   HM Flu shot utdhad 08/01/20  Tdap utd 08/31/18  prevnar and pna 23 utd  Consider shingrix vaccinein future covid 19 vx moderna had 01/05/20, next sch 02/02/20  Had both  -consider moderna 09/25/20   Hep A immune, consider hep B new vaccine x 2  doses in future with h/o fatty liver  Hep C negative 07/09/16  PSA-pt due to f/u with Dr. Arta Silence  9 or 10/21 f/u in 1 year  Get copy of labs and notes  Check PSA 2021 and f/u Dr. Rogers Blocker Seen 09/03/20 Dr. 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 Stark3/12/2018 5 polyps tubular repeat in 3 years  Dermatology established with [facility] last seen 12/02/17 tbse refer [place] [facility]   Dr. Raliegh Ip cardsappt10/2020had stress test not pfts yet [facility] [place] [facility]   Pharmacy [facility] insulin  otherwise [place]   Provider: Dr. Olivia Mackie McLean-Scocuzza-Internal Medicine

## 2020-09-26 LAB — COMPREHENSIVE METABOLIC PANEL
ALT: 19 U/L (ref 0–53)
AST: 19 U/L (ref 0–37)
Albumin: 4.2 g/dL (ref 3.5–5.2)
Alkaline Phosphatase: 57 U/L (ref 39–117)
BUN: 18 mg/dL (ref 6–23)
CO2: 28 mEq/L (ref 19–32)
Calcium: 9.4 mg/dL (ref 8.4–10.5)
Chloride: 99 mEq/L (ref 96–112)
Creatinine, Ser: 1.34 mg/dL (ref 0.40–1.50)
GFR: 52.98 mL/min — ABNORMAL LOW (ref >60.00)
Glucose, Bld: 123 mg/dL — ABNORMAL HIGH (ref 70–99)
Potassium: 4.3 mEq/L (ref 3.5–5.1)
Sodium: 136 mEq/L (ref 135–145)
Total Bilirubin: 0.7 mg/dL (ref 0.2–1.2)
Total Protein: 7.2 g/dL (ref 6.0–8.3)

## 2020-10-02 DIAGNOSIS — K649 Unspecified hemorrhoids: Secondary | ICD-10-CM | POA: Diagnosis not present

## 2020-10-11 DIAGNOSIS — Z20822 Contact with and (suspected) exposure to covid-19: Secondary | ICD-10-CM | POA: Diagnosis not present

## 2020-10-25 DIAGNOSIS — J3489 Other specified disorders of nose and nasal sinuses: Secondary | ICD-10-CM | POA: Diagnosis not present

## 2020-10-25 DIAGNOSIS — J301 Allergic rhinitis due to pollen: Secondary | ICD-10-CM | POA: Diagnosis not present

## 2020-11-15 DIAGNOSIS — J301 Allergic rhinitis due to pollen: Secondary | ICD-10-CM | POA: Diagnosis not present

## 2020-11-28 ENCOUNTER — Other Ambulatory Visit: Payer: Self-pay

## 2020-11-28 ENCOUNTER — Encounter: Payer: Self-pay | Admitting: Endocrinology

## 2020-11-28 ENCOUNTER — Ambulatory Visit: Payer: PPO | Admitting: Endocrinology

## 2020-11-28 VITALS — BP 130/64 | HR 80 | Ht 74.0 in | Wt 224.0 lb

## 2020-11-28 DIAGNOSIS — Z794 Long term (current) use of insulin: Secondary | ICD-10-CM

## 2020-11-28 DIAGNOSIS — E11319 Type 2 diabetes mellitus with unspecified diabetic retinopathy without macular edema: Secondary | ICD-10-CM

## 2020-11-28 LAB — POCT GLYCOSYLATED HEMOGLOBIN (HGB A1C): Hemoglobin A1C: 7.4 % — AB (ref 4.0–5.6)

## 2020-11-28 NOTE — Patient Instructions (Addendum)
Please continue the same insulin.   check your blood sugar twice a day.  vary the time of day when you check, between before the 3 meals, and at bedtime.  also check if you have symptoms of your blood sugar being too high or too low.  please keep a record of the readings and bring it to your next appointment here (or you can bring the meter itself).  You can write it on any piece of paper.  please call us sooner if your blood sugar goes below 70, or if you have a lot of readings over 200.   Please come back for a follow-up appointment in 4 months.    

## 2020-11-28 NOTE — Progress Notes (Signed)
Subjective:    Patient ID: [name], male    DOB: Nov 15, 1946, 75 y.o.   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  Therapy: insulin since soon after dx.   DKA: never.  Severe hypoglycemia: never.   Pancreatitis: never.   SDOH: he cannot afford brand name meds.   Other: he is on a BID insulin schedule, due to noncompliance; he is retired.   Interval history: pt states cbg's vary from 80-300, but most are in the 100's.  There is no trend throughout the day.  pt states he feels well in general.   Past Medical History:  Diagnosis Date  . Adenomatous colon polyp 06/2001  . ALLERGIC RHINITIS 08/03/2007  . Allergy   . Asthma   . BENIGN PROSTATIC HYPERTROPHY 08/03/2007  . Chronic back pain   . COPD (chronic obstructive pulmonary disease) ([facility])   . 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 Diabetes mellitus without complication ([facility])    x 30-40 years as of 11/08/18   . DIABETIC RETINOPATHY, BACKGROUND 09/29/2008  . DYSPHAGIA UNSPECIFIED 09/29/2008  . 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 GERD 11/28/2008  . HYPERLIPIDEMIA 08/03/2007  . Kidney stones    Dr. Angela Adam  . 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 PAIN IN SOFT TISSUES OF LIMB 01/07/2008  . UNSPECIFIED ANEMIA 12/29/2007  . Unspecified essential hypertension 01/07/2008  . URINARY CALCULUS 09/29/2008  . Urine incontinence     Past Surgical History:  Procedure Laterality Date  . CHOLECYSTECTOMY N/A 11/12/2016   Procedure: LAPAROSCOPIC CHOLECYSTECTOMY WITH INTRAOPERATIVE CHOLANGIOGRAM;  Surgeon: Ricarda Frame, MD;  Location: [facility] ORS;  Service: General;  Laterality: N/A;  . COLONOSCOPY     polyp  . HAND SURGERY Right    2016  . LITHOTRIPSY    . SHOULDER SURGERY Left    left, cyst and tumor removed  . URINARY SURGERY  2018   Dr Diamantina Monks "stretched" urethra around prostate to increase urine flow     Social History   Socioeconomic History  . Marital status: Married    Spouse name: Not on file  . Number of children: 2  . Years of education: Not on file  . Highest education level: Not on file  Occupational History  . Occupation: Metal Fabrication    Employer: [employer]  Tobacco Use  . Smoking status: Never Smoker  . Smokeless tobacco: Never Used  Vaping Use  . Vaping Use: Never used  Substance and Sexual Activity  . Alcohol use: Yes    Comment: occasional beer  . Drug use: No  . Sexual activity: Not on file  Other Topics Concern  . 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 Difficulty of Paying Living Expenses: Not hard at all  Food Insecurity: No Food Insecurity  . Worried About Programme researcher, broadcasting/film/video in the Last Year: Never true  . Ran Out of Food in the Last Year: Never true  Transportation Needs: No Transportation Needs  . Lack of Transportation (Medical): No  . Lack of Transportation (Non-Medical): No  Physical Activity: Not on file  Stress: No Stress Concern Present  .  Feeling of Stress : Not at all  Social Connections: Unknown  . Frequency of Communication with Friends and Family: Not on file  . Frequency of Social Gatherings with Friends and Family: Not on file  . Attends Religious Services: Not on file  . Active Member of Clubs or Organizations: Not on file  . Attends Banker Meetings: Not on file  . Marital Status: Married  Catering manager Violence: Not At Risk  . Fear of Current or Ex-Partner: No  . Emotionally Abused: No  . Physically Abused: No  . Sexually Abused: No    Current Outpatient Medications on File Prior to Visit  Medication Sig Dispense Refill  . albuterol (VENTOLIN HFA) 108 (90 Base) MCG/ACT inhaler Inhale 1-2 puffs into the  lungs every 6 (six) hours as needed for wheezing or shortness of breath. 18 g 11  . amLODipine (NORVASC) 2.5 MG tablet Take 1 tablet (2.5 mg total) by mouth daily. 90 tablet 3  . aspirin 81 MG tablet Take 81 mg by mouth daily.    Marland Kitchen atorvastatin (LIPITOR) 20 MG tablet Take 1 tablet (20 mg total) by mouth daily at 6 PM. 90 tablet 3  . Cholecalciferol (VITAMIN D3) 125 MCG (5000 UT) TABS Take 1 tablet by mouth daily.     . cyanocobalamin (,VITAMIN B-12,) 1000 MCG/ML injection Inject 1 mL (1,000 mcg total) into the muscle every 30 (thirty) days. 4 mL 12  . diclofenac Sodium (VOLTAREN) 1 % GEL Apply 2 g topically 4 (four) times daily. Prn 100 g 11  . Fluocinolone Acetonide 0.01 % OIL Place 5 drops in ear(s) 2 (two) times daily as needed. X1- 2 weeks 20 mL 11  . insulin NPH-regular Human (NOVOLIN 70/30 RELION) (70-30) 100 UNIT/ML injection INJECT 180 UNITS UNDER THE SKIN WITH BREAKFAST AND 40 UNITS WITH SUPPER 80 mL 0  . Insulin Syringe-Needle U-100 (RELION INSULIN SYR 1CC/30G) 30G X 5/16" 1 ML MISC 1 each by Does not apply route 2 (two) times a day. Use to inject insulin BID; E11.9 100 each 2  . Lancets (ONETOUCH ULTRASOFT) lancets Use as instructed 2x daily Dx: 250.00 100 each 5  . losartan-hydrochlorothiazide (HYZAAR) 100-25 MG tablet Take 1 tablet by mouth daily. 90 tablet 3  . MAGNESIUM PO Take 1 tablet by mouth daily.     . metoprolol succinate (TOPROL-XL) 25 MG 24 hr tablet Take 1 tablet (25 mg total) by mouth daily. 90 tablet 3  . NON FORMULARY Prostate Peanut from [facility] pharmacy.    Marland Kitchen omeprazole (PRILOSEC) 40 MG capsule Take 1 capsule (40 mg total) by mouth daily. 90 capsule 3  . ONETOUCH ULTRA test strip TEST TWICE A DAY 100 strip 9  . tamsulosin (FLOMAX) 0.4 MG CAPS capsule Take 0.4 mg by mouth daily.    . vitamin E 400 UNIT capsule Take 400 Units by mouth daily.     Current Facility-Administered Medications on File Prior to Visit  Medication Dose Route Frequency Provider Last Rate Last  Admin  . cyanocobalamin ((VITAMIN B-12)) injection 1,000 mcg  1,000 mcg Intramuscular Q30 days McLean-Scocuzza, Pasty Spillers, MD   1,000 mcg at 08/01/20 1027    Allergies  Allergen Reactions  . Sulfamethoxazole-Trimethoprim Rash    Family History  Problem Relation Age of Onset  . Diabetes Mother   . Depression Mother   . Prostate cancer Brother   . Diabetes Brother   . Heart disease Brother   . Diabetes Brother   . Colon cancer Neg  Hx   . Esophageal cancer Neg Hx   . Rectal cancer Neg Hx   . Stomach cancer Neg Hx     BP 130/64   Pulse 80   Ht 6\' 2"  (1.88 m)   Wt 224 lb (101.6 kg)   SpO2 96%   BMI 28.76 kg/m    Review of Systems He denies hypoglycemia    Objective:   Physical Exam VITAL SIGNS:  See vs page GENERAL: no distress Pulses: dorsalis pedis intact bilat.   MSK: no deformity of the feet CV: no leg edema Skin:  no ulcer on the feet.  normal color and temp on the feet. Neuro: sensation is intact to touch on the feet.     Lab Results  Component Value Date   HGBA1C 7.4 (A) 11/28/2020       Assessment & Plan:  Insulin-requiring type 2 DM, with CRI: this is the best control this pt should aim for, given this regimen, which does match insulin to her changing needs throughout the day.  Patient Instructions  Please continue the same insulin    check your blood sugar twice a day.  vary the time of day when you check, between before the 3 meals, and at bedtime.  also check if you have symptoms of your blood sugar being too high or too low.  please keep a record of the readings and bring it to your next appointment here (or you can bring the meter itself).  You can write it on any piece of paper.  please call us sooner if your blood sugar goes below 70, or if you have a lot of readings over 200.   Please come back for a follow-up appointment in 4 months.

## 2020-11-30 DIAGNOSIS — M25511 Pain in right shoulder: Secondary | ICD-10-CM | POA: Diagnosis not present

## 2020-11-30 DIAGNOSIS — G8929 Other chronic pain: Secondary | ICD-10-CM | POA: Diagnosis not present

## 2020-11-30 DIAGNOSIS — M7581 Other shoulder lesions, right shoulder: Secondary | ICD-10-CM | POA: Insufficient documentation

## 2020-11-30 DIAGNOSIS — M19011 Primary osteoarthritis, right shoulder: Secondary | ICD-10-CM | POA: Insufficient documentation

## 2020-12-31 ENCOUNTER — Other Ambulatory Visit: Payer: Self-pay | Admitting: Surgery

## 2020-12-31 DIAGNOSIS — M7581 Other shoulder lesions, right shoulder: Secondary | ICD-10-CM | POA: Diagnosis not present

## 2020-12-31 DIAGNOSIS — M19011 Primary osteoarthritis, right shoulder: Secondary | ICD-10-CM

## 2021-01-09 ENCOUNTER — Ambulatory Visit
Admission: RE | Admit: 2021-01-09 | Discharge: 2021-01-09 | Disposition: A | Discharge status: Home / Self Care | Payer: PPO | Source: Ambulatory Visit | Attending: Surgery | Admitting: Surgery

## 2021-01-09 ENCOUNTER — Other Ambulatory Visit: Payer: Self-pay

## 2021-01-09 DIAGNOSIS — M25511 Pain in right shoulder: Secondary | ICD-10-CM | POA: Diagnosis not present

## 2021-01-09 DIAGNOSIS — M7581 Other shoulder lesions, right shoulder: Secondary | ICD-10-CM | POA: Diagnosis not present

## 2021-01-09 DIAGNOSIS — M19011 Primary osteoarthritis, right shoulder: Secondary | ICD-10-CM | POA: Insufficient documentation

## 2021-01-09 IMAGING — MR MR SHOULDER*R* W/O CM
4 of 5 series · 31 of 40 positions shown · non-contrast
Comparison: Plain films right shoulder [DATE].

CLINICAL DATA: Worsening chronic right shoulder pain. No known
injury.

EXAM:
MRI OF THE RIGHT SHOULDER WITHOUT CONTRAST
TECHNIQUE: Multiplanar, multisequence MR imaging of the shoulder was performed.
No intravenous contrast was administered.

[Series 5: T2 fat-sat · axial · right · 4.0mm · 0.44mm/px · z∈[-88,+56]mm · 8 of 31 slices shown (1 of 3)]
[im 1/31]
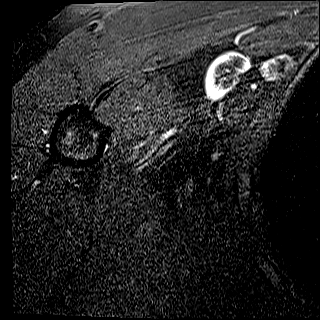
[im 4/31]
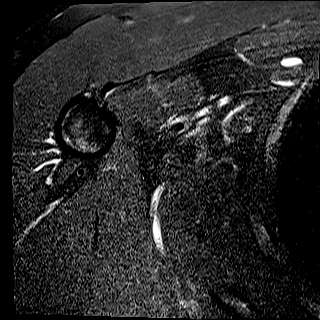
[im 11/31]
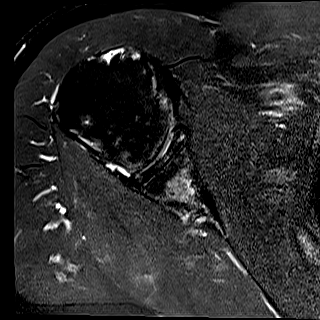
[im 14/31]
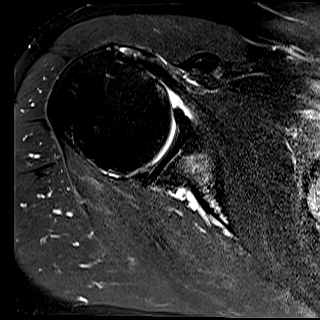
[im 17/31]
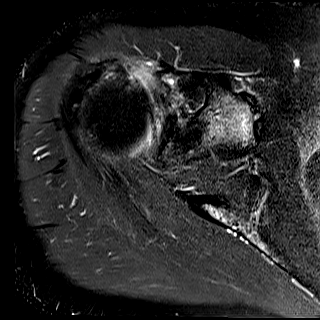
[im 21/31]
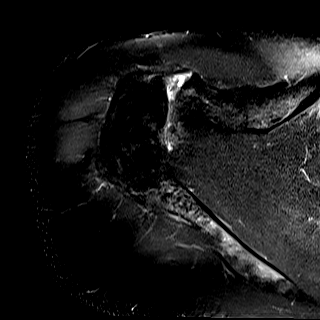
[im 27/31]
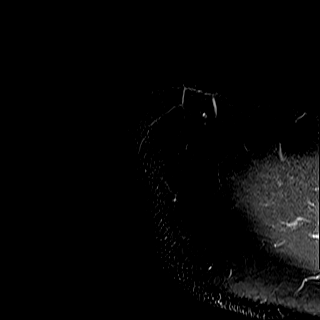
[im 31/31]
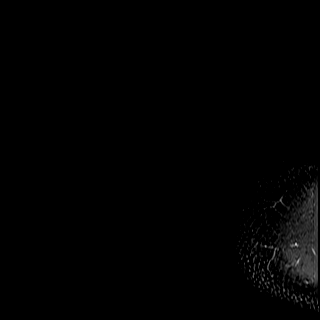

[Series 6: PD · oblique · right · 4.0mm · 0.44mm/px · 8 of 26 slices shown]
[im 1/26]
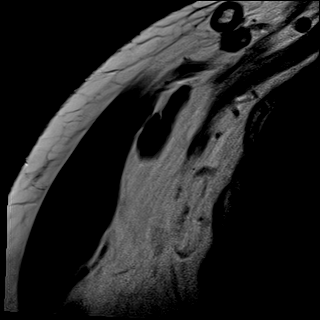
[im 4/26]
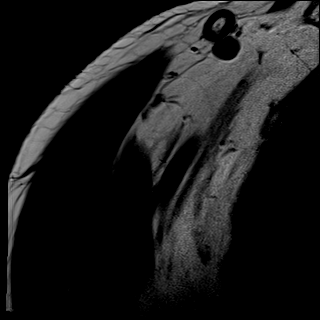
[im 8/26]
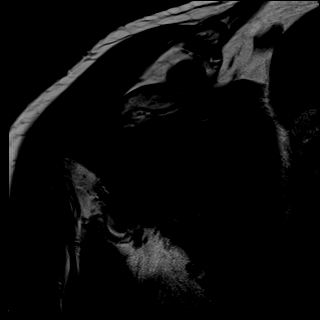
[im 11/26]
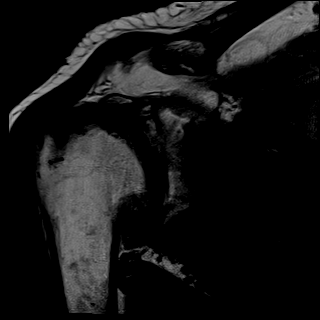
[im 15/26]
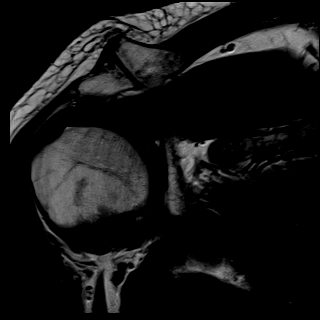
[im 18/26]
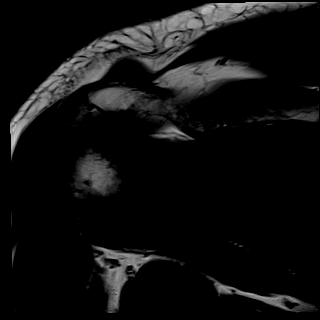
[im 22/26]
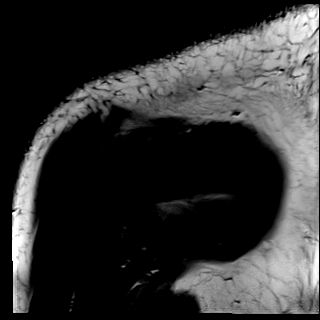
[im 26/26]
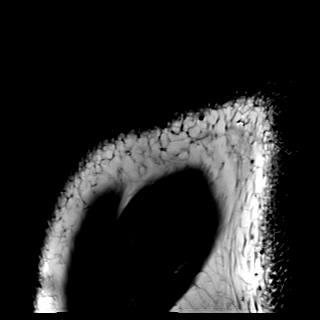

[Series 7: T2 fat-sat · oblique · right · 4.0mm · 0.44mm/px · 8 of 26 slices shown (2 of 3)]
[im 1/26]
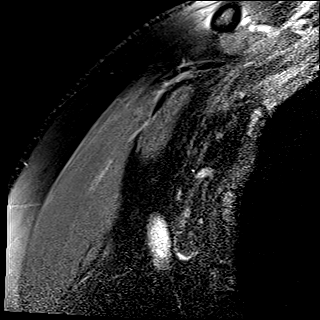
[im 4/26]
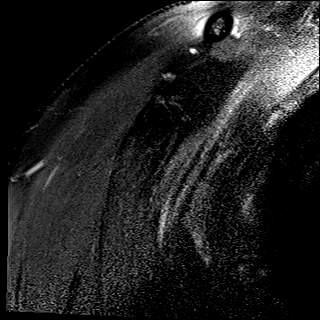
[im 8/26]
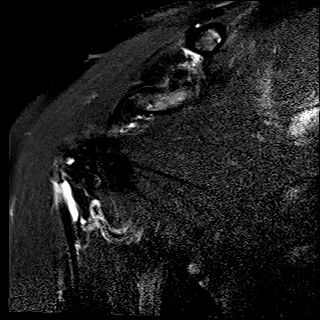
[im 11/26]
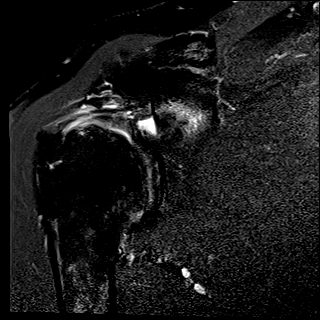
[im 15/26]
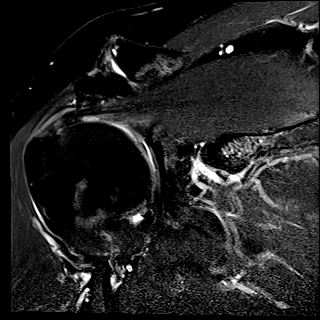
[im 18/26]
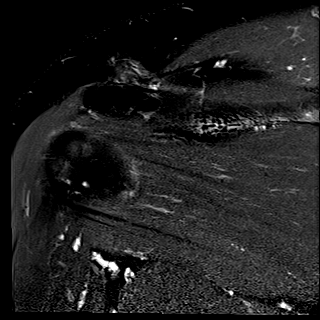
[im 22/26]
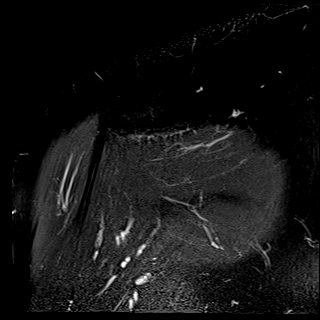
[im 26/26]
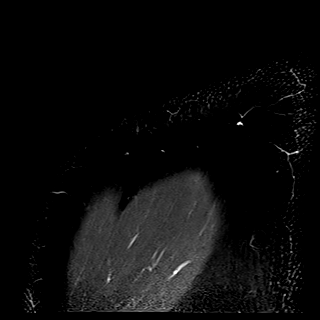

[Series 8: T2 fat-sat · oblique · right · 4.0mm · 0.23mm/px · 7 of 22 slices shown (3 of 3)]
[im 1/22]
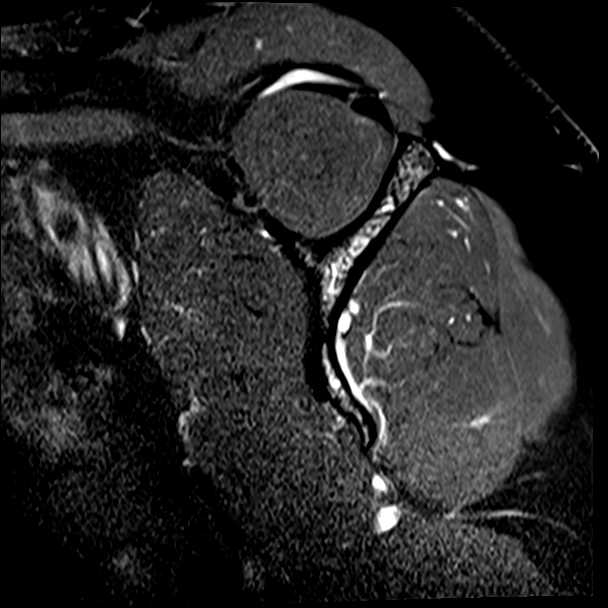
[im 4/22]
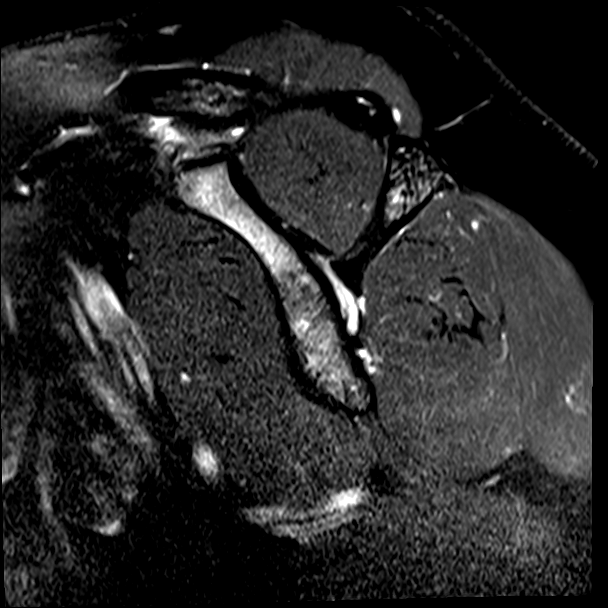
[im 8/22]
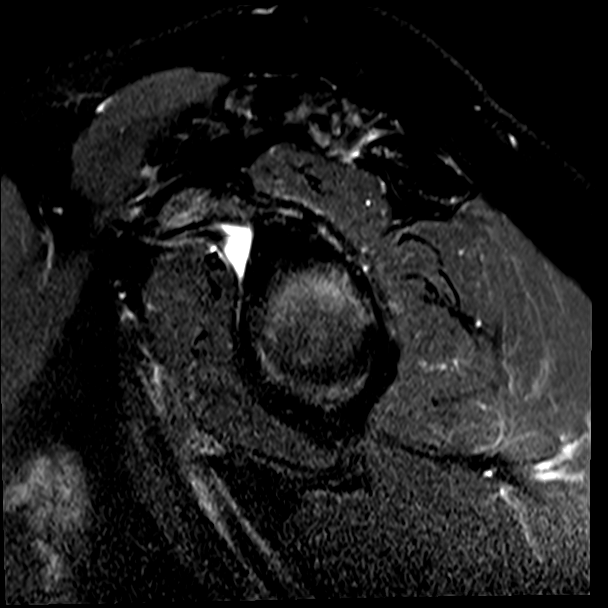
[im 11/22]
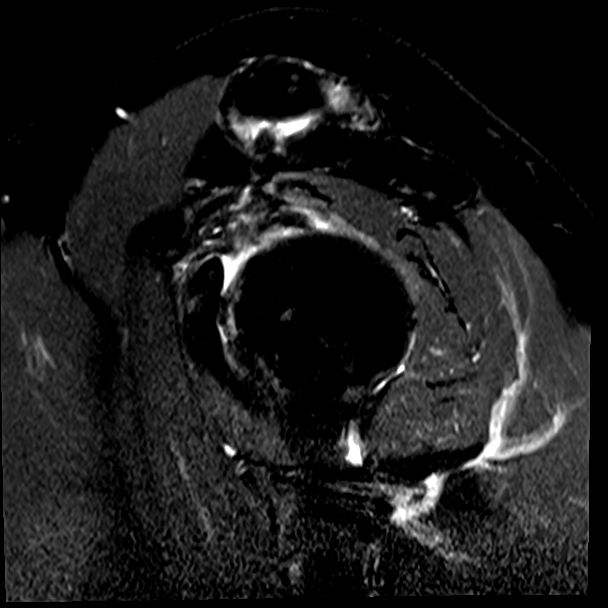
[im 15/22]
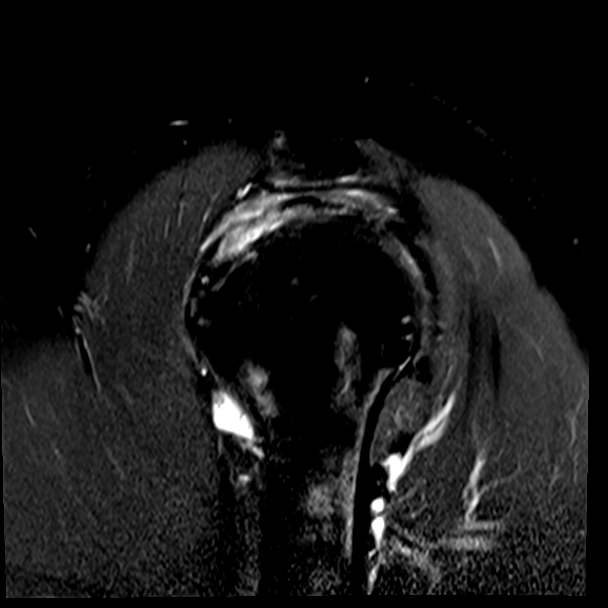
[im 18/22]
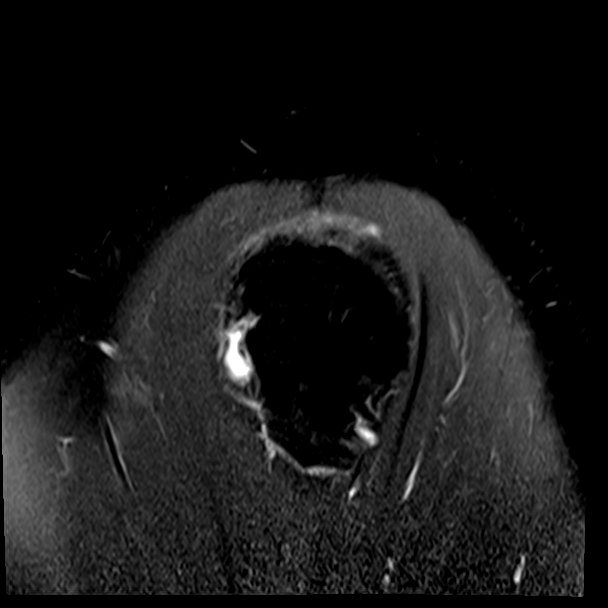
[im 22/22]
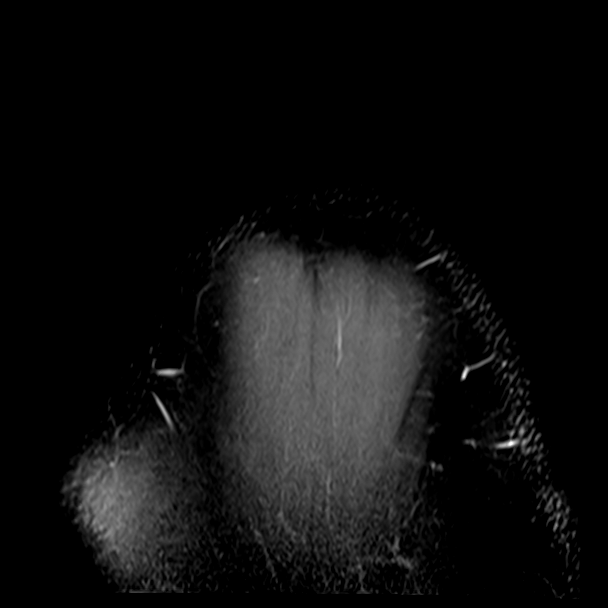

[31 of 40 positions shown; findings below may reference images not displayed]

FINDINGS: Rotator cuff: Rotator cuff tendinopathy without tear appears worst
in the supraspinatus.

Muscles:  Normal.  No atrophy or focal lesion.

Biceps long head: Severe tendinopathy and high-grade partial tearing
of the intra-articular segment are seen.

Acromioclavicular Joint: Bulky osteoarthritis. Type 1 acromion.
Small subacromial spur. No evidence of bursitis.

Glenohumeral Joint: Mild-to-moderate osteoarthritis with cartilage
thinning and an osteophyte off the humeral head.

Labrum:  Intact.

Bones:  No fracture or worrisome lesion.

Other: None.
IMPRESSION: Severe tendinopathy and high-grade partial tearing of the
intra-articular long head of biceps.

Rotator cuff tendinopathy without tear appears worst in the
supraspinatus.

Bulky acromioclavicular osteoarthritis. Small subacromial spur also
noted.

Mild to moderate glenohumeral osteoarthritis.

## 2021-01-10 ENCOUNTER — Telehealth: Payer: Self-pay | Admitting: Internal Medicine

## 2021-01-10 NOTE — Telephone Encounter (Signed)
Rejection Reason - Patient went elsewhere" Emmie Niemann said on Jan 10, 2021 1:50 PM  [facility]

## 2021-01-23 ENCOUNTER — Encounter: Payer: Self-pay | Admitting: Internal Medicine

## 2021-01-23 ENCOUNTER — Other Ambulatory Visit: Payer: Self-pay

## 2021-01-23 ENCOUNTER — Other Ambulatory Visit: Payer: PPO

## 2021-01-23 ENCOUNTER — Telehealth (INDEPENDENT_AMBULATORY_CARE_PROVIDER_SITE_OTHER): Payer: PPO | Admitting: Internal Medicine

## 2021-01-23 VITALS — Ht 72.0 in | Wt 220.0 lb

## 2021-01-23 DIAGNOSIS — J321 Chronic frontal sinusitis: Secondary | ICD-10-CM

## 2021-01-23 DIAGNOSIS — Z20822 Contact with and (suspected) exposure to covid-19: Secondary | ICD-10-CM

## 2021-01-23 DIAGNOSIS — R0981 Nasal congestion: Secondary | ICD-10-CM

## 2021-01-23 MED ORDER — METHYLPREDNISOLONE 4 MG PO TBPK: ORAL_TABLET | ORAL | 0 refills | Status: DC

## 2021-01-23 MED ORDER — SALINE SPRAY 0.65 % NA SOLN: 1.0000 | NASAL | 11 refills | Status: DC | PRN

## 2021-01-23 MED ORDER — AZITHROMYCIN 250 MG PO TABS: ORAL_TABLET | ORAL | 0 refills | Status: DC

## 2021-01-23 MED ORDER — FLUTICASONE PROPIONATE 50 MCG/ACT NA SUSP: 2.0000 | Freq: Every day | NASAL | 11 refills | Status: DC

## 2021-01-23 NOTE — Progress Notes (Signed)
Telephone Note  I connected with [name]   on 01/23/21 at  9:00 AM EST by telephone and verified that I am speaking with the correct person using two identifiers.  Location patient: home, [place] Location provider:work or home office Persons participating in the virtual visit: patient, provider  I discussed the limitations of evaluation and management by telemedicine and the availability of in person appointments. The patient expressed understanding and agreed to proceed.   HPI: Nasal congestion and sinus pressure no fever/appetite goal tried claritin/clarinex not helping allergic to pollen and foods with allergy testing in the past prednisone helped in the past and zpack and wants to try again   -COVID-19 vaccine status: 3/3  ROS: See pertinent positives and negatives per HPI.  Past Medical History:  Diagnosis Date  . Adenomatous colon polyp 06/2001  . ALLERGIC RHINITIS 08/03/2007  . Allergy   . Asthma   . BENIGN PROSTATIC HYPERTROPHY 08/03/2007  . Chronic back pain   . COPD (chronic obstructive pulmonary disease) ([place])   . 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 Diabetes mellitus without complication ([place])    x 77-82 years as of 11/08/18   . DIABETIC RETINOPATHY, BACKGROUND 09/29/2008  . DYSPHAGIA UNSPECIFIED 09/29/2008  . 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 GERD 11/28/2008  . HYPERLIPIDEMIA 08/03/2007  . Kidney stones    Dr. Boneta Lucks  . 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 PAIN IN SOFT TISSUES OF LIMB 01/07/2008  . UNSPECIFIED ANEMIA 12/29/2007  . Unspecified essential hypertension 01/07/2008  . URINARY CALCULUS 09/29/2008  . Urine incontinence     Past Surgical History:  Procedure Laterality Date  . CHOLECYSTECTOMY N/A 11/12/2016   Procedure: LAPAROSCOPIC CHOLECYSTECTOMY WITH INTRAOPERATIVE CHOLANGIOGRAM;  Surgeon: Clayburn Pert, MD;  Location: [facility] ORS;  Service: General;  Laterality: N/A;  . COLONOSCOPY     polyp  . HAND SURGERY Right    2016  .  LITHOTRIPSY    . SHOULDER SURGERY Left    left, cyst and tumor removed  . URINARY SURGERY  2018   Dr Daiva Eves "stretched" urethra around prostate to increase urine flow     Current Outpatient Medications:  .  amLODipine (NORVASC) 2.5 MG tablet, Take 1 tablet (2.5 mg total) by mouth daily., Disp: 90 tablet, Rfl: 3 .  aspirin 81 MG tablet, Take 81 mg by mouth daily., Disp: , Rfl:  .  atorvastatin (LIPITOR) 20 MG tablet, Take 1 tablet (20 mg total) by mouth daily at 6 PM., Disp: 90 tablet, Rfl: 3 .  azithromycin (ZITHROMAX) 250 MG tablet, 2 pills day 1 and 1 pill day 2-5 with food, Disp: 6 tablet, Rfl: 0 .  Cholecalciferol (VITAMIN D3) 125 MCG (5000 UT) TABS, Take 1 tablet by mouth daily. , Disp: , Rfl:  .  cyanocobalamin (,VITAMIN B-12,) 1000 MCG/ML injection, Inject 1 mL (1,000 mcg total) into the muscle every 30 (thirty) days., Disp: 4 mL, Rfl: 12 .  diclofenac Sodium (VOLTAREN) 1 % GEL, Apply 2 g topically 4 (four) times daily. Prn, Disp: 100 g, Rfl: 11 .  Fluocinolone Acetonide 0.01 % OIL, Place 5 drops in ear(s) 2 (two) times daily as needed. X1- 2 weeks, Disp: 20 mL, Rfl: 11 .  fluticasone (FLONASE) 50 MCG/ACT nasal spray, Place 2 sprays into both nostrils daily., Disp: 16 g, Rfl: 11 .  insulin NPH-regular Human (NOVOLIN 70/30 RELION) (70-30) 100 UNIT/ML injection, INJECT 180 UNITS UNDER THE SKIN WITH BREAKFAST AND 40 UNITS WITH SUPPER, Disp: 80 mL,  Rfl: 0 .  Insulin Syringe-Needle U-100 (RELION INSULIN SYR 1CC/30G) 30G X 5/16" 1 ML MISC, 1 each by Does not apply route 2 (two) times a day. Use to inject insulin BID; E11.9, Disp: 100 each, Rfl: 2 .  Lancets (ONETOUCH ULTRASOFT) lancets, Use as instructed 2x daily Dx: 250.00, Disp: 100 each, Rfl: 5 .  losartan-hydrochlorothiazide (HYZAAR) 100-25 MG tablet, Take 1 tablet by mouth daily., Disp: 90 tablet, Rfl: 3 .  MAGNESIUM PO, Take 1 tablet by mouth daily. , Disp: , Rfl:  .  methylPREDNISolone (MEDROL DOSEPAK) 4 MG TBPK tablet, Use as  directed, Disp: 21 tablet, Rfl: 0 .  metoprolol succinate (TOPROL-XL) 25 MG 24 hr tablet, Take 1 tablet (25 mg total) by mouth daily., Disp: 90 tablet, Rfl: 3 .  omeprazole (PRILOSEC) 40 MG capsule, Take 1 capsule (40 mg total) by mouth daily., Disp: 90 capsule, Rfl: 3 .  ONETOUCH ULTRA test strip, TEST TWICE A DAY, Disp: 100 strip, Rfl: 9 .  sodium chloride (OCEAN) 0.65 % SOLN nasal spray, Place 1 spray into both nostrils as needed for congestion., Disp: 30 mL, Rfl: 11 .  tamsulosin (FLOMAX) 0.4 MG CAPS capsule, Take 0.4 mg by mouth daily., Disp: , Rfl:  .  vitamin E 400 UNIT capsule, Take 400 Units by mouth daily., Disp: , Rfl:  .  albuterol (VENTOLIN HFA) 108 (90 Base) MCG/ACT inhaler, Inhale 1-2 puffs into the lungs every 6 (six) hours as needed for wheezing or shortness of breath. (Patient not taking: Reported on 01/23/2021), Disp: 18 g, Rfl: 11 .  NON FORMULARY, Prostate Peanut from [place]. (Patient not taking: Reported on 01/23/2021), Disp: , Rfl:   Current Facility-Administered Medications:  .  cyanocobalamin ((VITAMIN B-12)) injection 1,000 mcg, 1,000 mcg, Intramuscular, Q30 days, McLean-Scocuzza, Nino Glow, MD, 1,000 mcg at 08/01/20 1027  EXAM:  VITALS per patient if applicable:  PSYCH/NEURO: pleasant and cooperative, no obvious depression or anxiety, speech and thought processing grossly intact  ASSESSMENT AND PLAN:  Discussed the following assessment and plan:  Frontal sinusitis, unspecified chronicity - Plan: azithromycin (ZITHROMAX) 250 MG tablet, fluticasone (FLONASE) 50 MCG/ACT nasal spray, sodium chloride (OCEAN) 0.65 % SOLN nasal spray, methylPREDNISolone (MEDROL DOSEPAK) 4 MG TBPK tablet, Novel Coronavirus, NAA ([facility])  Nasal congestion - Plan: azithromycin (ZITHROMAX) 250 MG tablet, fluticasone (FLONASE) 50 MCG/ACT nasal spray, sodium chloride (OCEAN) 0.65 % SOLN nasal spray, methylPREDNISolone (MEDROL DOSEPAK) 4 MG TBPK tablet, Novel Coronavirus, NAA  ([facility])  Exposure to COVID-19 virus - Plan: Novel Coronavirus, NAA ([facility])  Continue claritin  F/u ENT in future if not better  -we discussed possible serious and likely etiologies, options for evaluation and workup, limitations of telemedicine visit vs in person visit, treatment, treatment risks and precautions.   I discussed the assessment and treatment plan with the patient. The patient was provided an opportunity to ask questions and all were answered. The patient agreed with the plan and demonstrated an understanding of the instructions.    Time spent 20 min Delorise Jackson, MD

## 2021-01-23 NOTE — Progress Notes (Signed)
Patient states he is having nasal congestion. States a few months ago was given prednisone for this and it has come back. Has some post nasal drip.

## 2021-01-24 LAB — NOVEL CORONAVIRUS, NAA: SARS-CoV-2, NAA: NOT DETECTED

## 2021-01-24 LAB — SARS-COV-2, NAA 2 DAY TAT

## 2021-01-28 DIAGNOSIS — M7581 Other shoulder lesions, right shoulder: Secondary | ICD-10-CM | POA: Diagnosis not present

## 2021-01-28 DIAGNOSIS — M19011 Primary osteoarthritis, right shoulder: Secondary | ICD-10-CM | POA: Diagnosis not present

## 2021-02-16 ENCOUNTER — Other Ambulatory Visit: Payer: Self-pay | Admitting: Endocrinology

## 2021-02-20 ENCOUNTER — Ambulatory Visit: Payer: PPO | Admitting: Dermatology

## 2021-02-20 ENCOUNTER — Encounter: Payer: Self-pay | Admitting: Dermatology

## 2021-02-20 ENCOUNTER — Other Ambulatory Visit: Payer: Self-pay

## 2021-02-20 DIAGNOSIS — D2272 Melanocytic nevi of left lower limb, including hip: Secondary | ICD-10-CM | POA: Diagnosis not present

## 2021-02-20 DIAGNOSIS — D239 Other benign neoplasm of skin, unspecified: Secondary | ICD-10-CM

## 2021-02-20 DIAGNOSIS — D489 Neoplasm of uncertain behavior, unspecified: Secondary | ICD-10-CM

## 2021-02-20 DIAGNOSIS — Z1283 Encounter for screening for malignant neoplasm of skin: Secondary | ICD-10-CM | POA: Diagnosis not present

## 2021-02-20 DIAGNOSIS — L918 Other hypertrophic disorders of the skin: Secondary | ICD-10-CM | POA: Diagnosis not present

## 2021-02-20 DIAGNOSIS — L578 Other skin changes due to chronic exposure to nonionizing radiation: Secondary | ICD-10-CM | POA: Diagnosis not present

## 2021-02-20 DIAGNOSIS — D229 Melanocytic nevi, unspecified: Secondary | ICD-10-CM

## 2021-02-20 DIAGNOSIS — L814 Other melanin hyperpigmentation: Secondary | ICD-10-CM | POA: Diagnosis not present

## 2021-02-20 DIAGNOSIS — L821 Other seborrheic keratosis: Secondary | ICD-10-CM | POA: Diagnosis not present

## 2021-02-20 DIAGNOSIS — L570 Actinic keratosis: Secondary | ICD-10-CM

## 2021-02-20 DIAGNOSIS — D1800 Hemangioma unspecified site: Secondary | ICD-10-CM

## 2021-02-20 HISTORY — DX: Other benign neoplasm of skin, unspecified: D23.9

## 2021-02-20 NOTE — Progress Notes (Signed)
New Patient Visit  Subjective  [name] is a 75 y.o. male who presents for the following: New Patient (Initial Visit) (Patient would like to talk about removing spots on left side of face. He also has some spot under axilla he would like to discuss removing. Patient denies history of skin cancer or history of dysplastic nevus. ).  Patient here for full body skin exam and skin cancer screening.   Objective  Well appearing patient in no apparent distress; mood and affect are within normal limits.  A full examination was performed including scalp, head, eyes, ears, nose, lips, neck, chest, axillae, abdomen, back, buttocks, bilateral upper extremities, bilateral lower extremities, hands, feet, fingers, toes, fingernails, and toenails. All findings within normal limits unless otherwise noted below.  Objective  left lateral brow x 1, right helix x 1, right cheek x 1, top of nose x 1, left temple x 1 (5): Erythematous thin papules/macules with gritty scale.   Objective  bilateral axilla: Fleshy, skin-colored pedunculated papules.    Objective  left posterior thigh: 0.4 cm brown and pink papule      Assessment & Plan  Actinic keratosis (5) left lateral brow x 1, right helix x 1, right cheek x 1, top of nose x 1, left temple x 1  Prior to procedure, discussed risks of blister formation, small wound, skin dyspigmentation, or rare scar following cryotherapy.    Destruction of lesion - left lateral brow x 1, right helix x 1, right cheek x 1, top of nose x 1, left temple x 1  Destruction method: cryotherapy   Informed consent: discussed and consent obtained   Lesion destroyed using liquid nitrogen: Yes   Cryotherapy cycles:  2 Outcome: patient tolerated procedure well with no complications   Post-procedure details: wound care instructions given    Acrochordon bilateral axilla  Benign-appearing.  Observation.  Call clinic for new or changing lesions.  Recommend daily use of  broad spectrum spf 30+ sunscreen to sun-exposed areas.   Discussed cosmetic removal option.  Patient deferred treatment at this time.    Neoplasm of uncertain behavior left posterior thigh  Epidermal / dermal shaving  Lesion diameter (cm):  0.4 Informed consent: discussed and consent obtained   Timeout: patient name, date of birth, surgical site, and procedure verified   Patient was prepped and draped in usual sterile fashion: area prepped with isopropyl alcohol. Anesthesia: the lesion was anesthetized in a standard fashion   Anesthetic:  1% lidocaine w/ epinephrine 1-100,000 buffered w/ 8.4% NaHCO3 Instrument used: flexible razor blade   Hemostasis achieved with: aluminum chloride   Outcome: patient tolerated procedure well   Post-procedure details: wound care instructions given   Additional details:  Mupirocin and a bandage applied  Specimen 1 - Surgical pathology Differential Diagnosis: r/o atypia   Check Margins: No 0.4 cm brown and pink papule  R/o atypia    Lentigines - Scattered tan macules - Due to sun exposure - Benign-appering, observe - Recommend daily broad spectrum sunscreen SPF 30+ to sun-exposed areas, reapply every 2 hours as needed. - Call for any changes  Seborrheic Keratoses - Stuck-on, waxy, tan-brown papules and/or plaques and on left side face  - Benign-appearing - Discussed benign etiology and prognosis. - Observe - Call for any changes  Melanocytic Nevi - Tan-brown and/or pink-flesh-colored symmetric macules and papules - Benign appearing on exam today - Observation - Call clinic for new or changing moles - Recommend daily use of broad spectrum spf  30+ sunscreen to sun-exposed areas.   Hemangiomas - Red papules - Discussed benign nature - Observe - Call for any changes  Actinic Damage - Chronic condition, secondary to cumulative UV/sun exposure - diffuse scaly erythematous macules with underlying dyspigmentation - Recommend daily  broad spectrum sunscreen SPF 30+ to sun-exposed areas, reapply every 2 hours as needed.  - Staying in the shade or wearing long sleeves, sun glasses (UVA+UVB protection) and wide brim hats (4-inch brim around the entire circumference of the hat) are also recommended for sun protection.  - Call for new or changing lesions.  Skin cancer screening performed today.  Return in about 1 year (around 02/20/2022) for for FBSE; 3 months for AK check.  I, Ruthell Rummage, CMA, am acting as scribe for Forest Gleason, MD.  Documentation: I have reviewed the above documentation for accuracy and completeness, and I agree with the above.  Forest Gleason, MD

## 2021-02-20 NOTE — Patient Instructions (Addendum)
Melanoma ABCDEs  Melanoma is the most dangerous type of skin cancer, and is the leading cause of death from skin disease.  You are more likely to develop melanoma if you:  Have light-colored skin, light-colored eyes, or red or blond hair  Spend a lot of time in the sun  Tan regularly, either outdoors or in a tanning bed  Have had blistering sunburns, especially during childhood  Have a close family member who has had a melanoma  Have atypical moles or large birthmarks  Early detection of melanoma is key since treatment is typically straightforward and cure rates are extremely high if we catch it early.   The first sign of melanoma is often a change in a mole or a new dark spot.  The ABCDE system is a way of remembering the signs of melanoma.  A for asymmetry:  The two halves do not match. B for border:  The edges of the growth are irregular. C for color:  A mixture of colors are present instead of an even brown color. D for diameter:  Melanomas are usually (but not always) greater than 49m - the size of a pencil eraser. E for evolution:  The spot keeps changing in size, shape, and color.  Please check your skin once per month between visits. You can use a small mirror in front and a large mirror behind you to keep an eye on the back side or your body.   If you see any new or changing lesions before your next follow-up, please call to schedule a visit.  Please continue daily skin protection including broad spectrum sunscreen SPF 30+ to sun-exposed areas, reapplying every 2 hours as needed when you're outdoors.   Staying in the shade or wearing long sleeves, sun glasses (UVA+UVB protection) and wide brim hats (4-inch brim around the entire circumference of the hat) are also recommended for sun protection.   Recommend taking Heliocare sun protection supplement daily in sunny weather for additional sun protection. For maximum protection on the sunniest days, you can take up to 2  capsules of regular Heliocare OR take 1 capsule of Heliocare Ultra. For prolonged exposure (such as a full day in the sun), you can repeat your dose of the supplement 4 hours after your first dose. Heliocare can be purchased at [facility] at [email or web address]   If you have any questions or concerns for your doctor, please call our main line at [phone] press option 4 to reach your doctor's medical assistant. If no one answers, please leave a voicemail as directed and we will return your call as soon as possible. Messages left after 4 pm will be answered the following business day.   You may also send [place] message via [place] We typically respond to MyChart messages within 1-2 business days.  For prescription refills, please ask your pharmacy to contact our office. Our fax number is [phone]  If you have an urgent issue when the clinic is closed that cannot wait until the next business day, you can page your doctor at the number below.    Please note that while we do our best to be available for urgent issues outside of office hours, we are not available 24/7.   If you have an urgent issue and are unable to reach [place] you may choose to seek medical care at your doctor's office, retail clinic, urgent care center, or emergency room.  If you have a medical emergency, please immediately call  911 or go to the emergency department.  Pager Numbers  - Dr. Nehemiah Massed: [phone]  - Dr. Laurence Ferrari: [phone]  - Dr. Nicole Kindred: [phone]  In the event of inclement weather, please call our main line at [phone] for an update on the status of any delays or closures.  Dermatology Medication Tips: Please keep the boxes that topical medications come in in order to help keep track of the instructions about where and how to use these. Pharmacies typically print the medication instructions only on the boxes and not directly on the medication tubes.   If your medication is too  expensive, please contact our office at [phone] option 4 or send [place] a message through [facility].   We are unable to tell what your co-pay for medications will be in advance as this is different depending on your insurance coverage. However, we may be able to find a substitute medication at lower cost or fill out paperwork to get insurance to cover a needed medication.   If a prior authorization is required to get your medication covered by your insurance company, please allow [place] 1-2 business days to complete this process.  Drug prices often vary depending on where the prescription is filled and some pharmacies may offer cheaper prices.  The website www.goodrx.com contains coupons for medications through different pharmacies. The prices here do not account for what the cost may be with help from insurance (it may be cheaper with your insurance), but the website can give you the price if you did not use any insurance.  - You can print the associated coupon and take it with your prescription to the pharmacy.  - You may also stop by our office during regular business hours and pick up a GoodRx coupon card.  - If you need your prescription sent electronically to a different pharmacy, notify our office through [facility] or by phone at [phone] option 4.  Cryotherapy Aftercare  . Wash gently with soap and water everyday.   Marland Kitchen Apply Vaseline and Band-Aid daily until healed.  Biopsy Wound Care Instructions  1. Leave the original bandage on for 24 hours if possible.  If the bandage becomes soaked or soiled before that time, it is OK to remove it and examine the wound.  A small amount of post-operative bleeding is normal.  If excessive bleeding occurs, remove the bandage, place gauze over the site and apply continuous pressure (no peeking) over the area for 30 minutes. If this does not work, please call our clinic as soon as possible or page your doctor if it is after hours.   2. Once a  day, cleanse the wound with soap and water. It is fine to shower. If a thick crust develops you may use a Q-tip dipped into dilute hydrogen peroxide (mix 1:1 with water) to dissolve it.  Hydrogen peroxide can slow the healing process, so use it only as needed.    3. After washing, apply petroleum jelly (Vaseline) or an antibiotic ointment if your doctor prescribed one for you, followed by a bandage.    4. For best healing, the wound should be covered with a layer of ointment at all times. If you are not able to keep the area covered with a bandage to hold the ointment in place, this may mean re-applying the ointment several times a day.  Continue this wound care until the wound has healed and is no longer open.   Itching and mild discomfort is normal during the healing  process. However, if you develop pain or severe itching, please call our office.   If you have any discomfort, you can take Tylenol (acetaminophen) or ibuprofen as directed on the bottle. (Please do not take these if you have an allergy to them or cannot take them for another reason).  Some redness, tenderness and white or yellow material in the wound is normal healing.  If the area becomes very sore and red, or develops a thick yellow-green material (pus), it may be infected; please notify us.    If you have stitches, return to clinic as directed to have the stitches removed. You will continue wound care for 2-3 days after the stitches are removed.   Wound healing continues for up to one year following surgery. It is not unusual to experience pain in the scar from time to time during the interval.  If the pain becomes severe or the scar thickens, you should notify the office.    A slight amount of redness in a scar is expected for the first six months.  After six months, the redness will fade and the scar will soften and fade.  The color difference becomes less noticeable with time.  If there are any problems, return for a post-op  surgery check at your earliest convenience.  To improve the appearance of the scar, you can use silicone scar gel, cream, or sheets (such as Mederma or Serica) every night for up to one year. These are available over the counter (without a prescription).  Please call our office at [phone] for any questions or concerns.

## 2021-02-27 ENCOUNTER — Telehealth: Payer: Self-pay

## 2021-02-27 NOTE — Telephone Encounter (Signed)
Tried calling patient regarding biopsy results. No answer. Left message on machine for patient to return call.

## 2021-02-27 NOTE — Telephone Encounter (Signed)
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 On the spectrum from normal mole to melanoma skin cancer, this is in between but it is much closer to a normal mole.  - These typically do not progress to melanoma or cause any trouble.  - People who have a history of atypical moles do have a slightly increased risk of developing melanoma somewhere on the body, so a yearly full body skin exam by a dermatologist is recommended.  - Monthly self skin checks and daily sun protection are also recommended.  - Please call if you notice a dark spot coming back where this biopsy was taken.  - Please also call if you notice any new or changing spots anywhere else on the body before your follow-up visit.  - Please call our office or send [place] a message if you have any questions or concerns about this biopsy result.    MAs please call. Thank you!

## 2021-03-04 ENCOUNTER — Telehealth: Payer: Self-pay

## 2021-03-04 NOTE — Telephone Encounter (Signed)
Patient advised of BX results.  °

## 2021-03-04 NOTE — Telephone Encounter (Signed)
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 On the spectrum from normal mole to melanoma skin cancer, this is in between but it is much closer to a normal mole.  - These typically do not progress to melanoma or cause any trouble.  - People who have a history of atypical moles do have a slightly increased risk of developing melanoma somewhere on the body, so a yearly full body skin exam by a dermatologist is recommended.  - Monthly self skin checks and daily sun protection are also recommended.  - Please call if you notice a dark spot coming back where this biopsy was taken.  - Please also call if you notice any new or changing spots anywhere else on the body before your follow-up visit.  - Please call our office or send [place] a message if you have any questions or concerns about this biopsy result.    MAs please call. Thank you!

## 2021-03-05 ENCOUNTER — Other Ambulatory Visit: Payer: Self-pay | Admitting: Surgery

## 2021-03-06 DIAGNOSIS — J301 Allergic rhinitis due to pollen: Secondary | ICD-10-CM | POA: Diagnosis not present

## 2021-03-06 DIAGNOSIS — H93293 Other abnormal auditory perceptions, bilateral: Secondary | ICD-10-CM | POA: Diagnosis not present

## 2021-03-13 ENCOUNTER — Other Ambulatory Visit
Admission: RE | Admit: 2021-03-13 | Discharge: 2021-03-13 | Disposition: A | Discharge status: Home / Self Care | Payer: PPO | Source: Ambulatory Visit | Attending: Surgery | Admitting: Surgery

## 2021-03-13 ENCOUNTER — Encounter: Payer: Self-pay | Admitting: Urgent Care

## 2021-03-13 ENCOUNTER — Other Ambulatory Visit: Payer: Self-pay

## 2021-03-13 DIAGNOSIS — Z01818 Encounter for other preprocedural examination: Secondary | ICD-10-CM | POA: Insufficient documentation

## 2021-03-13 DIAGNOSIS — I10 Essential (primary) hypertension: Secondary | ICD-10-CM | POA: Insufficient documentation

## 2021-03-13 HISTORY — DX: Personal history of urinary calculi: Z87.442

## 2021-03-13 HISTORY — DX: Atherosclerotic heart disease of native coronary artery without angina pectoris: I25.10

## 2021-03-13 NOTE — Patient Instructions (Addendum)
Your procedure is scheduled on: 03/19/21 - Tuesday Report to the Registration Desk on the 1st floor of the [facility]. To find out your arrival time, please call [phone] between 1PM - 3PM on: 03/18/21 - Monday Report to [facility] on 03/15/21 for Labs at 10 am.  REMEMBER: Instructions that are not followed completely may result in serious medical risk, up to and including death; or upon the discretion of your surgeon and anesthesiologist your surgery may need to be rescheduled.  Do not eat food after midnight the night before surgery.  No gum chewing, lozengers or hard candies.  You may however, drink CLEAR liquids up to 2 hours before you are scheduled to arrive for your surgery. Do not drink anything within 2 hours of your scheduled arrival time. Type 1 and Type 2 diabetics should only drink water.  TAKE THESE MEDICATIONS THE MORNING OF SURGERY WITH A SIP OF WATER:  - amLODipine (NORVASC) 2.5 MG tablet - metoprolol succinate (TOPROL-XL) 25 MG 24 hr tablet - omeprazole (PRILOSEC) 40 MG capsule, take one the night before and one on the morning of surgery - helps to prevent nausea after surgery.  Use inhalers albuterol (VENTOLIN HFA) 108 (90 Base) MCG/ACT inhaler on the day of surgery and bring to the hospital.  Take 1/2 of usual insulin dose the night before surgery and none on the morning of surgery.  Follow recommendations from Cardiologist, Pulmonologist or PCP regarding stopping Aspirin, Coumadin, Plavix, Eliquis, Pradaxa, or Pletal. Continue taking Aspirin but do not take the morning of surgery.  One week prior to surgery: Stop Anti-inflammatories (NSAIDS) such as Advil, Aleve, Ibuprofen, Motrin, Naproxen, Naprosyn and Aspirin based products such as Excedrin, Goodys Powder, BC Powder.  Stop ANY OVER THE COUNTER supplements until after surgery.  No Alcohol for 24 hours before or after surgery.  No Smoking including e-cigarettes for 24 hours prior to surgery.  No  chewable tobacco products for at least 6 hours prior to surgery.  No nicotine patches on the day of surgery.  Do not use any "recreational" drugs for at least a week prior to your surgery.  Please be advised that the combination of cocaine and anesthesia may have negative outcomes, up to and including death. If you test positive for cocaine, your surgery will be cancelled.  On the morning of surgery brush your teeth with toothpaste and water, you may rinse your mouth with mouthwash if you wish. Do not swallow any toothpaste or mouthwash.  Do not wear jewelry, make-up, hairpins, clips or nail polish.  Do not wear lotions, powders, or perfumes.   Do not shave body from the neck down 48 hours prior to surgery just in case you cut yourself which could leave a site for infection.  Also, freshly shaved skin may become irritated if using the CHG soap.  Contact lenses, hearing aids and dentures may not be worn into surgery.  Do not bring valuables to the hospital. [facility] is not responsible for any missing/lost belongings or valuables.   Use CHG Soap or wipes as directed on instruction sheet.  Notify your doctor if there is any change in your medical condition (cold, fever, infection).  Wear comfortable clothing (specific to your surgery type) to the hospital.  Plan for stool softeners for home use; pain medications have a tendency to cause constipation. You can also help prevent constipation by eating foods high in fiber such as fruits and vegetables and drinking plenty of fluids as your diet allows.  After surgery, you can help prevent lung complications by doing breathing exercises.  Take deep breaths and cough every 1-2 hours. Your doctor may order a device called an Incentive Spirometer to help you take deep breaths. When coughing or sneezing, hold a pillow firmly against your incision with both hands. This is called "splinting." Doing this helps protect your incision. It also  decreases belly discomfort.  If you are being admitted to the hospital overnight, leave your suitcase in the car. After surgery it may be brought to your room.  If you are being discharged the day of surgery, you will not be allowed to drive home. You will need a responsible adult (18 years or older) to drive you home and stay with you that night.   If you are taking public transportation, you will need to have a responsible adult (18 years or older) with you. Please confirm with your physician that it is acceptable to use public transportation.   Please call the [facility] at [phone] if you have any questions about these instructions.  Surgery Visitation Policy:  Patients undergoing a surgery or procedure may have one family member or support person with them as long as that person is not COVID-19 positive or experiencing its symptoms.  That person may remain in the waiting area during the procedure.  Inpatient Visitation:    Visiting hours are 7 a.m. to 8 p.m. Inpatients will be allowed two visitors daily. The visitors may change each day during the patient's stay. No visitors under the age of 57. Any visitor under the age of 65 must be accompanied by an adult. The visitor must pass COVID-19 screenings, use hand sanitizer when entering and exiting the patient's room and wear a mask at all times, including in the patient's room. Patients must also wear a mask when staff or their visitor are in the room. Masking is required regardless of vaccination status.

## 2021-03-14 ENCOUNTER — Other Ambulatory Visit: Payer: Self-pay | Admitting: Internal Medicine

## 2021-03-14 DIAGNOSIS — I2510 Atherosclerotic heart disease of native coronary artery without angina pectoris: Secondary | ICD-10-CM

## 2021-03-14 DIAGNOSIS — I10 Essential (primary) hypertension: Secondary | ICD-10-CM

## 2021-03-15 ENCOUNTER — Other Ambulatory Visit
Admission: RE | Admit: 2021-03-15 | Discharge: 2021-03-15 | Disposition: A | Discharge status: Home / Self Care | Payer: PPO | Source: Ambulatory Visit | Attending: Surgery | Admitting: Surgery

## 2021-03-15 ENCOUNTER — Other Ambulatory Visit: Payer: Self-pay

## 2021-03-15 DIAGNOSIS — Z20822 Contact with and (suspected) exposure to covid-19: Secondary | ICD-10-CM | POA: Diagnosis not present

## 2021-03-15 DIAGNOSIS — R54 Age-related physical debility: Secondary | ICD-10-CM | POA: Diagnosis not present

## 2021-03-15 DIAGNOSIS — I10 Essential (primary) hypertension: Secondary | ICD-10-CM | POA: Insufficient documentation

## 2021-03-15 DIAGNOSIS — Z01818 Encounter for other preprocedural examination: Secondary | ICD-10-CM | POA: Insufficient documentation

## 2021-03-15 LAB — BASIC METABOLIC PANEL
Anion gap: 10 (ref 5–15)
BUN: 16 mg/dL (ref 8–23)
CO2: 25 mmol/L (ref 22–32)
Calcium: 9.5 mg/dL (ref 8.9–10.3)
Chloride: 104 mmol/L (ref 98–111)
Creatinine, Ser: 1.4 mg/dL — ABNORMAL HIGH (ref 0.61–1.24)
GFR, Estimated: 53 mL/min — ABNORMAL LOW (ref >60)
Glucose, Bld: 122 mg/dL — ABNORMAL HIGH (ref 70–99)
Potassium: 3.6 mmol/L (ref 3.5–5.1)
Sodium: 139 mmol/L (ref 135–145)

## 2021-03-15 LAB — CBC
HCT: 47.2 % (ref 39.0–52.0)
Hemoglobin: 17.2 g/dL — ABNORMAL HIGH (ref 13.0–17.0)
MCH: 33 pg (ref 26.0–34.0)
MCHC: 36.4 g/dL — ABNORMAL HIGH (ref 30.0–36.0)
MCV: 90.6 fL (ref 80.0–100.0)
Platelets: 186 10*3/uL (ref 150–400)
RBC: 5.21 MIL/uL (ref 4.22–5.81)
RDW: 13.3 % (ref 11.5–15.5)
WBC: 7 10*3/uL (ref 4.0–10.5)
nRBC: 0 % (ref 0.0–0.2)

## 2021-03-15 LAB — SARS CORONAVIRUS 2 (TAT 6-24 HRS): SARS Coronavirus 2: NEGATIVE

## 2021-03-18 MED ORDER — CEFAZOLIN SODIUM-DEXTROSE 2-4 GM/100ML-% IV SOLN: 2.0000 g | INTRAVENOUS | Status: AC

## 2021-03-18 MED ORDER — ORAL CARE MOUTH RINSE: 15.0000 mL | Freq: Once | OROMUCOSAL | Status: DC

## 2021-03-18 MED ORDER — CHLORHEXIDINE GLUCONATE 0.12 % MT SOLN: 15.0000 mL | Freq: Once | OROMUCOSAL | Status: DC

## 2021-03-18 MED ORDER — SODIUM CHLORIDE 0.9 % IV SOLN
INTRAVENOUS | Status: DC
Start: 2021-03-18 — End: 2021-03-26

## 2021-03-19 ENCOUNTER — Ambulatory Visit: Admission: RE | Admit: 2021-03-19 | Payer: PPO | Source: Ambulatory Visit | Admitting: Surgery

## 2021-03-19 ENCOUNTER — Encounter: Admission: RE | Payer: Self-pay | Source: Ambulatory Visit

## 2021-03-19 SURGERY — SHOULDER ARTHROSCOPY WITH SUBACROMIAL DECOMPRESSION, ROTATOR CUFF REPAIR AND BICEP TENDON REPAIR
Anesthesia: Choice | Site: Shoulder | Laterality: Right

## 2021-03-19 NOTE — H&P (Signed)
History of Present Illness:  [name] is a 75 y.o. male who presents for follow-up of his right shoulder pain secondary to impingement/tendinopathy with advanced degenerative joint disease of the [facility] joint. The patient notes little change in his symptoms since he was last seen 4 weeks ago. If anything, he feels that his symptoms have worsened slightly since undergoing the MRI scan. He describes increased pain in the anterior aspect of his shoulder with occasional paresthesias extending all the way down his arm to his hand. He has difficulty sleeping on his right side at night, and has difficulty with repetitive activities at above shoulder level. He denies any reinjury to the shoulder, and denies any fevers or chills. He has been applying Voltaren gel to the shoulder on a daily basis, but otherwise not taking any medications for discomfort. Since his last visit he has undergone an MRI scan and presents today to review these results.  Current Outpatient Medications: . albuterol 90 mcg/actuation inhaler Inhale into the lungs  . amLODIPine (NORVASC) 2.5 MG tablet Take 1 tablet by mouth once daily  . aspirin 81 MG EC tablet Take 81 mg by mouth once daily  . atorvastatin (LIPITOR) 20 MG tablet Take 1 tablet (20 mg total) by mouth once daily 30 tablet 1  . blood glucose diagnostic test strip 1 each by Other route 2 (two) times daily. And lancets 2/day 250.43  . cholecalciferol (CHOLECALCIFEROL) 1000 unit tablet Take by mouth once daily  . cyanocobalamin (VITAMIN B12) 1,000 mcg/mL injection INJECT 1 ML (1,000 MCG TOTAL) INTO THE MUSCLE EVERY 30 (THIRTY) DAYS.  Marland Kitchen diclofenac (VOLTAREN) 1 % topical gel Apply 2 g topically as needed  . fluocinolone acetonide (DERMOTIC) 0.01 % otic drop PLACE 5 DROPS IN EAR(S) 2 (TWO) TIMES DAILY AS NEEDED. X1 2 WEEKS  . hydroCHLOROthiazide (HYDRODIURIL) 12.5 MG tablet TAKE 1 TABLET (12.5 MG TOTAL) BY MOUTH DAILY. IN AM  . insulin NPH-REGULAR (HUMULIN,NOVOLIN 70/30) 100 unit/mL  (70-30) injection 150 units with breakfast and 70 units with supper, and syringes 3/day  . lactulose (ENULOSE) 10 gram/15 mL oral solution Take 30 mLs by mouth as needed  . lancets Use as instructed 2x daily Dx: 250.00  . losartan-hydrochlorothiazide (HYZAAR) 100-12.5 mg tablet Take 1 tablet by mouth once daily  . magnesium oxide (MAG-OX) 400 mg (241.3 mg magnesium) tablet Take 400 mg by mouth once daily  . metoprolol succinate (TOPROL-XL) 25 MG XL tablet Take 1 tablet by mouth once daily  . NON FORMULARY Prostate PeantusOnce daily  . omeprazole (PRILOSEC) 40 MG DR capsule Take by mouth.  . pen needle, diabetic 31 gauge x 3/16" needle Use as directed 2 X daily. BD ULTRAPFINE MINI. Dx: 250.00  . POTASSIUM/MAGNESIUM (MAGNESIUM-POTASSIUM ORAL) Take 1 tablet by mouth once daily  . predniSONE (DELTASONE) 10 mg tablet pack  . SODIUM CHLORIDE 0.65 % nasal spray  . tamsulosin (FLOMAX) 0.4 mg capsule Take by mouth  . vitamin E 400 UNIT capsule Take 400 Units by mouth once daily   Allergies:  . Sulfamethoxazole-Trimethoprim - Rash     Past Medical History:  . Adenomatous polyp of colon, unspecified  . Allergic rhinitis 08/03/2007  . Anemia  . Asthma  . BPH (benign prostatic hyperplasia) 08/03/2007  . Chronic back pain  . COPD (chronic obstructive pulmonary disease) (CMS-HCC)  . Diabetes mellitus type 2, uncomplicated (CMS-HCC)  . Diabetic retinopathy, background (CMS-HCC) 09/29/2008  . Esophageal stricture 12/28/2008  . GERD (gastroesophageal reflux disease) 11/28/2008  . Hyperlipidemia 08/03/2007  .  Hypertension 01/07/2008  . Kidney stones  . 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 Urinary incontinence   Past Surgical History:  . CHOLECYSTECTOMY 11/12/2016  . COLONOSCOPY 2019  . hand surgery Right  . kidney stones  . LITHOTRIPSY  . shoulder surgery Left  . urinary surgery 2018   Family History:  . Diabetes Mother  . Depression Mother  . Diabetes Brother  . Stroke Brother   . High blood pressure (Hypertension) Brother  . Heart disease Brother  . Prostate cancer Brother  . Diabetes Brother  . Colon cancer Neg Hx   Social History:   Socioeconomic History:  Marland Kitchen Marital status: Married  Spouse name: Not on file  . Number of children: Not on file  . Years of education: Not on file  . Highest education level: Not on file  Occupational History  . Not on file  Tobacco Use  . Smoking status: Never Smoker  . Smokeless tobacco: Never Used  Vaping Use  . Vaping Use: Never used  Substance and Sexual Activity  . Alcohol use: Yes  Alcohol/week: 0.0 standard drinks  Comment: occassionally  . Drug use: Not on file  . Sexual activity: Not on file  Other Topics Concern  . Not on file  Social History Narrative  . Not on file   Social Determinants of Health:   Financial Resource Strain: Not on file  Food Insecurity: Not on file  Transportation Needs: Not on file   Review of Systems:  A comprehensive 14 point ROS was performed, reviewed, and the pertinent orthopaedic findings are documented in the HPI.  Physical Exam: Vitals:  01/28/21 0818  BP: 138/80  Weight: (!) 101.4 kg (223 lb 9.6 oz)  Height: 182.9 cm (6')  PainSc: 5  PainLoc: Shoulder   General/Constitutional: The patient appears to be well-nourished, well-developed, and in no acute distress. Neuro/Psych: Normal mood and affect, oriented to person, place and time. Eyes: Non-icteric. Pupils are equal, round, and reactive to light, and exhibit synchronous movement. ENT: Unremarkable. Lymphatic: No palpable adenopathy. Respiratory: Lungs clear to auscultation, Normal chest excursion, No wheezes and Non-labored breathing Cardiovascular: Regular rate and rhythm. No murmurs. and No edema, swelling or tenderness, except as noted in detailed exam. Integumentary: No impressive skin lesions present, except as noted in detailed exam. Musculoskeletal: Unremarkable, except as noted in detailed  exam.  Rightshoulder exam: SKIN:Normal SWELLING:None WARMTH:None LYMPH NODES: No adenopathy palpable CREPITUS:None TENDERNESS:Mildly tender over anterior shoulder in area of bicipital groove and minimally tender over AC joint ROM (active):  Forward flexion:145degrees Abduction: 140degrees Internal rotation: L1 ROM (passive):  Forward flexion: 150 degrees Abduction: 145 degrees ER/IR at 90 abd: 90 degrees/55 degrees  He experiences mild pain in the superior aspect of the shoulder with forward flexion and abduction.  STRENGTH: Forward flexion: 4+/5 Abduction: 4+/5 External rotation: 4+/5 Internal rotation: 4+-5/5 Pain with RC testing: No  STABILITY:Normal  SPECIAL TESTS: Luan Pulling' test: Mildly positive Speed's test: Negative Capsulitis - pain w/ passive ER: No Crossed arm test: Minimally positive Crank: Not evaluated Anterior apprehension: Negative Posterior apprehension: Not evaluated  He again is neurovascularly intact to the rightupper extremity.  X-rays/MRI/Lab data:  A recent MRI scan of the right shoulder is available for review. By report, the scan demonstrates evidence of rotator cuff tendinopathy, primarily involving the supraspinatus without evidence of partial or full-thickness tearing. There also is evidence of advanced tendinopathy with high-grade partial tearing of the long head of the biceps tendon. There is "bulky acromioclavicular osteoarthritis. A small  subacromial spur also noted." Finally, there is evidence of "mild to moderate glenohumeral osteoarthritis". Both the films and report were reviewed by myself and  discussed with the patient.  Assessment: . Rotator cuff tendinitis, right . Primary osteoarthritis of right shoulder  . DJD of right AC (acromioclavicular) joint   Plan: The treatment options were discussed with the patient. In addition, patient educational materials were provided regarding the diagnosis and treatment options. The patient is quite frustrated by his persistent symptoms and functional limitations, and is ready to consider more aggressive treatment options. Therefore, I have recommended a surgical procedure, specifically a right shoulder arthroscopy with debridement, decompression, distal clavicle excision, possible rotator cuff repair, and biceps tenodesis. The procedure was discussed with the patient, as were the potential risks (including bleeding, infection, nerve and/or blood vessel injury, persistent or recurrent pain, failure of the repair, progression of arthritis, need for further surgery, blood clots, strokes, heart attacks and/or arhythmias, pneumonia, etc.) and benefits. The patient states his understanding and wishes to proceed. All of the patient's questions and concerns were answered. He can call any time with further concerns. He will follow up post-surgery, routine.   H&P reviewed and patient re-examined. No changes.

## 2021-03-29 ENCOUNTER — Encounter: Payer: Self-pay | Admitting: Internal Medicine

## 2021-03-29 ENCOUNTER — Ambulatory Visit (INDEPENDENT_AMBULATORY_CARE_PROVIDER_SITE_OTHER): Payer: PPO | Admitting: Internal Medicine

## 2021-03-29 ENCOUNTER — Other Ambulatory Visit: Payer: Self-pay

## 2021-03-29 VITALS — BP 124/60 | HR 84 | Temp 97.5°F | Ht 72.0 in | Wt 225.0 lb

## 2021-03-29 DIAGNOSIS — N200 Calculus of kidney: Secondary | ICD-10-CM | POA: Diagnosis not present

## 2021-03-29 DIAGNOSIS — N281 Cyst of kidney, acquired: Secondary | ICD-10-CM

## 2021-03-29 DIAGNOSIS — Z0100 Encounter for examination of eyes and vision without abnormal findings: Secondary | ICD-10-CM

## 2021-03-29 DIAGNOSIS — I10 Essential (primary) hypertension: Secondary | ICD-10-CM | POA: Diagnosis not present

## 2021-03-29 DIAGNOSIS — K5900 Constipation, unspecified: Secondary | ICD-10-CM | POA: Diagnosis not present

## 2021-03-29 DIAGNOSIS — E785 Hyperlipidemia, unspecified: Secondary | ICD-10-CM

## 2021-03-29 DIAGNOSIS — E119 Type 2 diabetes mellitus without complications: Secondary | ICD-10-CM

## 2021-03-29 DIAGNOSIS — E1159 Type 2 diabetes mellitus with other circulatory complications: Secondary | ICD-10-CM | POA: Diagnosis not present

## 2021-03-29 DIAGNOSIS — Z23 Encounter for immunization: Secondary | ICD-10-CM | POA: Diagnosis not present

## 2021-03-29 DIAGNOSIS — J439 Emphysema, unspecified: Secondary | ICD-10-CM | POA: Diagnosis not present

## 2021-03-29 DIAGNOSIS — I152 Hypertension secondary to endocrine disorders: Secondary | ICD-10-CM | POA: Diagnosis not present

## 2021-03-29 DIAGNOSIS — K222 Esophageal obstruction: Secondary | ICD-10-CM

## 2021-03-29 MED ORDER — SHINGRIX 50 MCG/0.5ML IM SUSR: 0.5000 mL | Freq: Once | INTRAMUSCULAR | 0 refills | Status: DC

## 2021-03-29 MED ORDER — LOSARTAN POTASSIUM-HCTZ 100-12.5 MG PO TABS
1.0000 | ORAL_TABLET | Freq: Every day | ORAL | 3 refills | Status: DC
Start: 2021-03-29 — End: 2021-12-25

## 2021-03-29 NOTE — Progress Notes (Signed)
Chief Complaint  Patient presents with  . Follow-up   F/u  1. Disc shingrix vaccine  2. 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 B/l shoulder pain esp right 5/10 needs to call [facility] ortho back for appt Dr. Roland Rack rec surgery he is not sure about this yet  4. C/o neck pain and numbness upper arms consider EMG/NCS in the future and Xrays with [facility] future needs to call for f/u 5. BP controlled today on norvasc 2.5 mg qd, hyzaar 100-25 at times feeling dizzy in the am with meds toprol xl 25 mg qd  5. Right chest pain rad to RLQ  Review of Systems  Constitutional: Negative for weight loss.  HENT: Negative for hearing loss.   Eyes: Negative for blurred vision.  Respiratory: Negative for shortness of breath.   Cardiovascular: Negative for chest pain.  Gastrointestinal: Negative for abdominal pain.  Musculoskeletal: Positive for joint pain.  Skin: Negative for rash.  Psychiatric/Behavioral: Negative for depression.   Past Medical History:  Diagnosis Date  . Adenomatous colon polyp 06/2001  . ALLERGIC RHINITIS 08/03/2007  . Allergy   . Asthma   . BENIGN PROSTATIC HYPERTROPHY 08/03/2007  . Chronic back pain   . COPD (chronic obstructive pulmonary disease) ([place])   . Coronary artery disease   . 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 Diabetes mellitus without complication ([place])    x [id number] years as of 11/08/18   . DIABETIC RETINOPATHY, BACKGROUND 09/29/2008  . DYSPHAGIA UNSPECIFIED 09/29/2008  . Dysplastic nevus 02/20/2021   L post thigh - mild   . 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 GERD 11/28/2008  . History of kidney stones   . HYPERLIPIDEMIA 08/03/2007  . Kidney stone   . Kidney stones    Dr. Boneta Lucks  . 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 PAIN IN SOFT TISSUES OF LIMB 01/07/2008  . UNSPECIFIED ANEMIA 12/29/2007  . Unspecified essential hypertension 01/07/2008  . URINARY CALCULUS 09/29/2008  . Urine  incontinence    Past Surgical History:  Procedure Laterality Date  . CHOLECYSTECTOMY N/A 11/12/2016   Procedure: LAPAROSCOPIC CHOLECYSTECTOMY WITH INTRAOPERATIVE CHOLANGIOGRAM;  Surgeon: Clayburn Pert, MD;  Location: [facility] ORS;  Service: General;  Laterality: N/A;  . COLONOSCOPY     polyp  . dupyretens contracture     right hand surgery,left hand   . HAND SURGERY Right    2016  . LITHOTRIPSY    . SHOULDER SURGERY Left    left, cyst and tumor removed  . URINARY SURGERY  2018   Dr Daiva Eves "stretched" urethra around prostate to increase urine flow   Family History  Problem Relation Age of Onset  . Diabetes Mother   . Depression Mother   . Prostate cancer Brother   . Diabetes Brother   . Heart disease Brother   . Diabetes Brother   . Colon cancer Neg Hx   . Esophageal cancer Neg Hx   . Rectal cancer Neg Hx   . Stomach cancer Neg Hx    Social History   Socioeconomic History  . Marital status: Married    Spouse name: Not on file  . Number of children: 2  . Years of education: Not on file  . Highest education level: Not on file  Occupational History  . Occupation: Metal Fabrication    Employer: [employer]  Tobacco Use  .  Smoking status: Never Smoker  . Smokeless tobacco: Never Used  Vaping Use  . Vaping Use: Never used  Substance and Sexual Activity  . Alcohol use: Yes    Comment: occasional beer  . Drug use: No  . Sexual activity: Not on file  Other Topics Concern  . 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 Difficulty of Paying Living Expenses: Not hard at all  Food Insecurity: No Food Insecurity  . Worried About Charity fundraiser in the Last Year: Never true  . Ran Out of Food in the Last Year: Never true  Transportation Needs: No Transportation Needs  .  Lack of Transportation (Medical): No  . Lack of Transportation (Non-Medical): No  Physical Activity: Not on file  Stress: No Stress Concern Present  . Feeling of Stress : Not at all  Social Connections: Unknown  . Frequency of Communication with Friends and Family: Not on file  . Frequency of Social Gatherings with Friends and Family: Not on file  . Attends Religious Services: Not on file  . Active Member of Clubs or Organizations: Not on file  . Attends Archivist Meetings: Not on file  . Marital Status: Married  Human resources officer Violence: Not At Risk  . Fear of Current or Ex-Partner: No  . Emotionally Abused: No  . Physically Abused: No  . Sexually Abused: No   Current Meds  Medication Sig  . aspirin 81 MG tablet Take 81 mg by mouth daily.  . calcium carbonate (OSCAL) 1500 (600 Ca) MG TABS tablet Take 600 mg of elemental calcium by mouth daily with breakfast.  . Cholecalciferol (VITAMIN D3) 125 MCG (5000 UT) TABS Take 5,000 Units by mouth daily.  . cyanocobalamin (,VITAMIN B-12,) 1000 MCG/ML injection Inject 1 mL (1,000 mcg total) into the muscle every 30 (thirty) days.  . diclofenac Sodium (VOLTAREN) 1 % GEL Apply 2 g topically 4 (four) times daily. Prn (Patient taking differently: Apply 2 g topically 4 (four) times daily as needed (pain).)  . Fluocinolone Acetonide 0.01 % OIL Place 5 drops in ear(s) 2 (two) times daily as needed. X1- 2 weeks (Patient taking differently: Place 5 drops in ear(s) 2 (two) times daily as needed.)  . fluticasone (FLONASE) 50 MCG/ACT nasal spray Place 2 sprays into both nostrils daily.  . Insulin Syringe-Needle U-100 (RELION INSULIN SYR 1CC/30G) 30G X 5/16" 1 ML MISC 1 each by Does not apply route 2 (two) times a day. Use to inject insulin BID; E11.9  . Lancets (ONETOUCH ULTRASOFT) lancets Use as instructed 2x daily Dx: 250.00  . losartan-hydrochlorothiazide (HYZAAR) 100-12.5 MG tablet Take 1 tablet by mouth daily. D/c 100-25 mg dose  . MAGNESIUM  PO Take 1 tablet by mouth daily.   . metoprolol succinate (TOPROL-XL) 25 MG 24 hr tablet TAKE 1 TABLET BY MOUTH EVERY DAY  . ONETOUCH ULTRA test strip TEST TWICE A DAY  . sodium chloride (OCEAN) 0.65 % SOLN nasal spray Place 1 spray into both nostrils as needed for congestion.  . tamsulosin (FLOMAX) 0.4 MG CAPS capsule Take 0.4 mg by mouth daily.  . vitamin E 400 UNIT capsule Take 400 Units by mouth daily.  . [DISCONTINUED] amLODipine (NORVASC) 2.5 MG tablet  Take 1 tablet (2.5 mg total) by mouth daily.  . [DISCONTINUED] atorvastatin (LIPITOR) 20 MG tablet Take 1 tablet (20 mg total) by mouth daily at 6 PM.  . [DISCONTINUED] insulin NPH-regular Human (NOVOLIN 70/30 RELION) (70-30) 100 UNIT/ML injection INJECT 180 UNITS UNDER THE SKIN WITH BREAKFAST AND 40 UNITS WITH SUPPER (Patient taking differently: Inject 50-180 Units into the skin See admin instructions. INJECT 180 UNITS UNDER THE SKIN WITH BREAKFAST AND 50 UNITS WITH SUPPER)  . [DISCONTINUED] losartan-hydrochlorothiazide (HYZAAR) 100-25 MG tablet Take 1 tablet by mouth daily.  . [DISCONTINUED] omeprazole (PRILOSEC) 40 MG capsule Take 1 capsule (40 mg total) by mouth daily.  . [DISCONTINUED] Zoster Vaccine Adjuvanted [facility]) injection Inject 0.5 mLs into the muscle once for 1 dose.  . [DISCONTINUED] Zoster Vaccine Adjuvanted [facility]) injection Inject 0.5 mLs into the muscle once for 1 dose.   Current Facility-Administered Medications for the 03/29/21 encounter (Office Visit) with McLean-Scocuzza, Nino Glow, MD  Medication  . cyanocobalamin ((VITAMIN B-12)) injection 1,000 mcg   Allergies  Allergen Reactions  . Sulfamethoxazole-Trimethoprim Rash   Recent Results (from the past 2160 hour(s))  Novel Coronavirus, NAA ([facility])     Status: None   Collection Time: 01/23/21  2:00 PM   Specimen: Nasal Swab; Nasopharyngeal(NP) swabs in vial transport medium   Nasopharynge  Result Value Ref Range   SARS-CoV-2, NAA Not Detected Not Detected     Comment: This nucleic acid amplification test was developed and its performance characteristics determined by [employer], [employer] and [employer]. Nucleic acid amplification tests include RT-PCR and TMA. This test has not been FDA cleared or approved. This test has been authorized by FDA under an Emergency Use Authorization (EUA). This test is only authorized for the duration of time the declaration that circumstances exist justifying the authorization of the emergency use of in vitro diagnostic tests for detection of SARS-CoV-2 virus and/or diagnosis of COVID-19 infection under section 564(b)(1) of the Act, 21 U.S.C. 854OEV-0(J) (1), unless the authorization is terminated or revoked sooner. When diagnostic testing is negative, the possibility of a false negative result should be considered in the context of a patient's recent exposures and the presence of clinical signs and symptoms consistent with COVID-19. An individual without symptoms of COVID-19 and who is not shedding SARS-CoV-2 virus wo uld expect to have a negative (not detected) result in this assay.   SARS-COV-2, NAA 2 DAY TAT     Status: None   Collection Time: 01/23/21  2:00 PM   Nasopharynge  Result Value Ref Range   SARS-CoV-2, NAA 2 DAY TAT Performed   CBC     Status: Abnormal   Collection Time: 03/15/21 10:37 AM  Result Value Ref Range   WBC 7.0 4.0 - 10.5 K/uL   RBC 5.21 4.22 - 5.81 MIL/uL   Hemoglobin 17.2 (H) 13.0 - 17.0 g/dL   HCT 47.2 39.0 - 52.0 %   MCV 90.6 80.0 - 100.0 fL   MCH 33.0 26.0 - 34.0 pg   MCHC 36.4 (H) 30.0 - 36.0 g/dL   RDW 13.3 11.5 - 15.5 %   Platelets 186 150 - 400 K/uL   nRBC 0.0 0.0 - 0.2 %    Comment: Performed at [facility], [address], [place], [place] [address]  Basic metabolic panel     Status: Abnormal   Collection Time: 03/15/21 10:37 AM  Result Value Ref Range   Sodium 139 135 - 145 mmol/L   Potassium 3.6 3.5 - 5.1 mmol/L   Chloride  104 98 - 111 mmol/L   CO2 25 22 - 32  mmol/L   Glucose, Bld 122 (H) 70 - 99 mg/dL    Comment: Glucose reference range applies only to samples taken after fasting for at least 8 hours.   BUN 16 8 - 23 mg/dL   Creatinine, Ser 1.40 (H) 0.61 - 1.24 mg/dL   Calcium 9.5 8.9 - 10.3 mg/dL   GFR, Estimated 53 (L) >60 mL/min    Comment: (NOTE) Calculated using the CKD-EPI Creatinine Equation (2021)    Anion gap 10 5 - 15    Comment: Performed at [facility], [address] [place], [place] [address]  SARS CORONAVIRUS 2 (TAT 6-24 HRS) Nasopharyngeal Nasopharyngeal Swab     Status: None   Collection Time: 03/15/21 11:37 AM   Specimen: Nasopharyngeal Swab  Result Value Ref Range   SARS Coronavirus 2 NEGATIVE NEGATIVE    Comment: (NOTE) SARS-CoV-2 target nucleic acids are NOT DETECTED.  The SARS-CoV-2 RNA is generally detectable in upper and lower respiratory specimens during the acute phase of infection. Negative results do not preclude SARS-CoV-2 infection, do not rule out co-infections with other pathogens, and should not be used as the sole basis for treatment or other patient management decisions. Negative results must be combined with clinical observations, patient history, and epidemiological information. The expected result is Negative.  Fact Sheet for Patients: SugarRoll.be  Fact Sheet for Healthcare Providers: https://www.woods-mathews.com/  This test is not yet approved or cleared by the [place] FDA and  has been authorized for detection and/or diagnosis of SARS-CoV-2 by FDA under an Emergency Use Authorization (EUA). This EUA will remain  in effect (meaning this test can be used) for the duration of the COVID-19 declaration under Se ction 564(b)(1) of the Act, 21 U.S.C. section 360bbb-3(b)(1), unless the authorization is terminated or revoked sooner.  Performed at [facility], [place] [address] [place], [place] [address]   POCT glycosylated  hemoglobin (Hb A1C)     Status: Abnormal   Collection Time: 04/11/21 10:04 AM  Result Value Ref Range   Hemoglobin A1C 7.2 (A) 4.0 - 5.6 %   HbA1c POC (<> result, manual entry)     HbA1c, POC (prediabetic range)     HbA1c, POC (controlled diabetic range)     Objective  Body mass index is 30.52 kg/m. Wt Readings from Last 3 Encounters:  04/11/21 226 lb 9.6 oz (102.8 kg)  03/29/21 225 lb (102.1 kg)  01/23/21 220 lb (99.8 kg)   Temp Readings from Last 3 Encounters:  03/29/21 (!) 97.5 F (36.4 C) (Oral)  09/25/20 97.7 F (36.5 C) (Oral)  05/24/20 97.6 F (36.4 C) (Oral)   BP Readings from Last 3 Encounters:  04/11/21 (!) 144/68  03/29/21 124/60  11/28/20 130/64   Pulse Readings from Last 3 Encounters:  04/11/21 78  03/29/21 84  11/28/20 80    Physical Exam Vitals and nursing note reviewed.  Constitutional:      Appearance: Normal appearance. He is well-developed and well-groomed. He is obese.  HENT:     Head: Normocephalic and atraumatic.  Eyes:     Conjunctiva/sclera: Conjunctivae normal.     Pupils: Pupils are equal, round, and reactive to light.  Cardiovascular:     Rate and Rhythm: Normal rate and regular rhythm.     Heart sounds: Normal heart sounds.  Pulmonary:     Effort: Pulmonary effort is normal.     Breath sounds: Normal breath sounds.  Abdominal:     General: Abdomen is flat. Bowel sounds are normal.  Skin:    General: Skin is warm and dry.  Neurological:     General: No focal deficit present.     Mental Status: He is alert and oriented to person, place, and time. Mental status is at baseline.     Gait: Gait normal.  Psychiatric:        Attention and Perception: Attention and perception normal.        Mood and Affect: Mood and affect normal.        Speech: Speech normal.        Behavior: Behavior normal. Behavior is cooperative.        Thought Content: Thought content normal.        Cognition and Memory: Cognition and memory normal.         Judgment: Judgment normal.     Assessment  Plan  Hypertension controlled associated with diabetes ([place]) - Plan: Ambulatory referral to Ophthalmology 70/30 96 in am and 84/5 units in pm  2.5 mg qd, hyzaar 100-25 at times feeling dizzy in the am with meds toprol xl 25 mg qd  Reduce to 100-12.5 hyzaar to to feeling dizzy norvasc 2.5 mg qd Diabetic eye exam ([place]) - Plan: Ambulatory referral to Ophthalmology F/u endocrine  Would benefit from freestyle [place] - Plan: DG ESOPHAGUS W SINGLE CM (SOL OR THIN BA) W/u dysphagia right chest pain  If continues consider f/u cards and GI   Constipation, unspecified constipation type - Plan: DG Abd 1 View  Kidney stones - Plan: DG Abd 1 View Fu Dr. Yves Dill   Hyperlipidemia, unspecified hyperlipidemia type - Plan: atorvastatin (LIPITOR) 20 MG tablet  Pulmonary emphysema, unspecified emphysema type ([place]) - Plan: albuterol (VENTOLIN HFA) 108 (90 Base) MCG/ACT inhaler   HM Flu shot utdhad 08/01/20  Tdap utd 08/31/18  prevnar and pna 23 utd  Consider shingrix vaccinein futureordered to pharmacy x 2 doses covid 19 vx moderna 4/4  Hep A immune, consider hep B new vaccine x 2 doses in future with h/o fatty liver  Hep C negative 07/09/16  PSA-pt due to f/u with Dr. Arta Silence  9 or 10/21 f/u in 1 year  Get copy of labs and notes  Check PSA 2021 and f/u Dr. Rogers Blocker Seen 09/03/20 Dr. Rogers Blocker urology kidney stones and BPH, ED Rx Cialis 20 mg #4 RF x 6  PSA 05/29/20 3.32 09/03/20 PSA 3.2   -MRI ab w/w/ohad12/06/2019 bosniak 2 hemmorrhagic renal cyst left kidney likely benign, bosniak 2 lower right kidney and multiple small bosniak 1 renal cysts b/l kidneys   Never smoker except for cigarswith COPD noted on imaging, consider pfts in future  GI Dr. Stark3/12/2018 5 polyps tubular repeat in 3 years  Dermatology [facility] with [facility] last seen 12/02/17 tbse refer [facility]   Dr. Raliegh Ip cardsappt10/2020had stress test  not pfts yet [facility]   Pharmacy [facility] insulin  otherwise [facility]    Provider: Dr. Olivia Mackie McLean-Scocuzza-Internal Medicine

## 2021-03-29 NOTE — Patient Instructions (Addendum)
[facility]  [address] [place], [place] [address]  [phone]  Poggi, Smith Mince, MD  [place]  [facility] [place], [place] [address]  [phone] (Work)  [phone] (Fax)    Call and schedule appt ask can you try PT 1st before surgery  Consider Xray neck, consider nerve conduction study right arm   Consider making appt Dr. Rogers Blocker Consider glucose tablets  Consider freestyle libre 2   Miralax/colace for constipation   Zoster Vaccine, Recombinant injection-2 shots sih What is this medicine? ZOSTER VACCINE (ZOS ter vak SEEN) is a vaccine used to reduce the risk of getting shingles. This vaccine is not used to treat shingles or nerve pain from shingles. This medicine may be used for other purposes; ask your health care provider or pharmacist if you have questions. COMMON BRAND NAME(S): [facility] What should I tell my health care provider before I take this medicine? They need to know if you have any of these conditions:  cancer  immune system problems  an unusual or allergic reaction to Zoster vaccine, other medications, foods, dyes, or preservatives  pregnant or trying to get pregnant  breast-feeding How should I use this medicine? This vaccine is injected into a muscle. It is given by a health care provider. A copy of Vaccine Information Statements will be given before each vaccination. Be sure to read this information carefully each time. This sheet may change often. Talk to your health care provider about the use of this vaccine in children. This vaccine is not approved for use in children. Overdosage: If you think you have taken too much of this medicine contact a poison control center or emergency room at once. NOTE: This medicine is only for you. Do not share this medicine with others. What if I miss a dose? Keep appointments for follow-up (booster) doses. It is important not to miss your dose. Call your health care  provider if you are unable to keep an appointment. What may interact with this medicine?  medicines that suppress your immune system  medicines to treat cancer  steroid medicines like prednisone or cortisone This list may not describe all possible interactions. Give your health care provider a list of all the medicines, herbs, non-prescription drugs, or dietary supplements you use. Also tell them if you smoke, drink alcohol, or use illegal drugs. Some items may interact with your medicine. What should I watch for while using this medicine? Visit your health care provider regularly. This vaccine, like all vaccines, may not fully protect everyone. What side effects may I notice from receiving this medicine? Side effects that you should report to your doctor or health care professional as soon as possible:  allergic reactions (skin rash, itching or hives; swelling of the face, lips, or tongue)  trouble breathing Side effects that usually do not require medical attention (report these to your doctor or health care professional if they continue or are bothersome):  chills  headache  fever  nausea  pain, redness, or irritation at site where injected  tiredness  vomiting This list may not describe all possible side effects. Call your doctor for medical advice about side effects. You may report side effects to FDA at 1-800-FDA-1088. Where should I keep my medicine? This vaccine is only given by a health care provider. It will not be stored at home. NOTE: This sheet is a summary. It may not cover all possible information. If you have questions about this medicine, talk to your  doctor, pharmacist, or health care provider.  2021 Elsevier/Gold Standard (2019-12-16 16:23:07)

## 2021-04-11 ENCOUNTER — Other Ambulatory Visit: Payer: Self-pay

## 2021-04-11 ENCOUNTER — Ambulatory Visit: Payer: PPO | Admitting: Endocrinology

## 2021-04-11 VITALS — BP 144/68 | HR 78 | Ht 72.0 in | Wt 226.6 lb

## 2021-04-11 DIAGNOSIS — Z794 Long term (current) use of insulin: Secondary | ICD-10-CM | POA: Diagnosis not present

## 2021-04-11 DIAGNOSIS — E11319 Type 2 diabetes mellitus with unspecified diabetic retinopathy without macular edema: Secondary | ICD-10-CM

## 2021-04-11 LAB — POCT GLYCOSYLATED HEMOGLOBIN (HGB A1C): Hemoglobin A1C: 7.2 % — AB (ref 4.0–5.6)

## 2021-04-11 MED ORDER — NOVOLIN 70/30 RELION (70-30) 100 UNIT/ML ~~LOC~~ SUSP: SUBCUTANEOUS | 0 refills | Status: DC

## 2021-04-11 NOTE — Progress Notes (Signed)
Subjective:    Patient ID: [name], male    DOB: 19-Jul-1946, 75 y.o.   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  Therapy: insulin since soon after dx.   DKA: never.  Severe hypoglycemia: never.   Pancreatitis: never.   SDOH: he cannot afford brand name meds.   Other: he is on a BID insulin schedule, due to noncompliance; he is retired.   Interval history: pt states cbg's vary from 80-300, but most are in the 100's.  There is no trend throughout the day.  pt states he feels well in general.  He seldom has hypoglycemia, and these episodes are mild.  This happens fasting, after he takes extra insulin later in the day, for elev cbg.   Past Medical History:  Diagnosis Date  . Adenomatous colon polyp 06/2001  . ALLERGIC RHINITIS 08/03/2007  . Allergy   . Asthma   . BENIGN PROSTATIC HYPERTROPHY 08/03/2007  . Chronic back pain   . COPD (chronic obstructive pulmonary disease) ([place])   . Coronary artery disease   . 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 Diabetes mellitus without complication ([place])    x 26-83 years as of 11/08/18   . DIABETIC RETINOPATHY, BACKGROUND 09/29/2008  . DYSPHAGIA UNSPECIFIED 09/29/2008  . Dysplastic nevus 02/20/2021   L post thigh - mild   . 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 GERD 11/28/2008  . History of kidney stones   . HYPERLIPIDEMIA 08/03/2007  . Kidney stone   . Kidney stones    Dr. Boneta Lucks  . 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 PAIN IN SOFT TISSUES OF LIMB 01/07/2008  . UNSPECIFIED ANEMIA 12/29/2007  . Unspecified essential hypertension 01/07/2008  . URINARY CALCULUS 09/29/2008  . Urine incontinence     Past Surgical History:  Procedure Laterality Date  . CHOLECYSTECTOMY N/A 11/12/2016   Procedure: LAPAROSCOPIC CHOLECYSTECTOMY WITH INTRAOPERATIVE CHOLANGIOGRAM;  Surgeon: Clayburn Pert, MD;  Location: [facility] ORS;  Service: General;   Laterality: N/A;  . COLONOSCOPY     polyp  . dupyretens contracture     right hand surgery,left hand   . HAND SURGERY Right    2016  . LITHOTRIPSY    . SHOULDER SURGERY Left    left, cyst and tumor removed  . URINARY SURGERY  2018   Dr Daiva Eves "stretched" urethra around prostate to increase urine flow    Social History   Socioeconomic History  . Marital status: Married    Spouse name: Not on file  . Number of children: 2  . Years of education: Not on file  . Highest education level: Not on file  Occupational History  . Occupation: Metal Fabrication    Employer: [employer]  Tobacco Use  . Smoking status: Never Smoker  . Smokeless tobacco: Never Used  Vaping Use  . Vaping Use: Never used  Substance and Sexual Activity  . Alcohol use: Yes    Comment: occasional beer  . Drug use: No  . Sexual activity: Not on file  Other Topics Concern  . 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 Difficulty of Paying  Living Expenses: Not hard at all  Food Insecurity: No Food Insecurity  . Worried About Charity fundraiser in the Last Year: Never true  . Ran Out of Food in the Last Year: Never true  Transportation Needs: No Transportation Needs  . Lack of Transportation (Medical): No  . Lack of Transportation (Non-Medical): No  Physical Activity: Not on file  Stress: No Stress Concern Present  . Feeling of Stress : Not at all  Social Connections: Unknown  . Frequency of Communication with Friends and Family: Not on file  . Frequency of Social Gatherings with Friends and Family: Not on file  . Attends Religious Services: Not on file  . Active Member of Clubs or Organizations: Not on file  . Attends Archivist Meetings: Not on file  . Marital Status: Married  Arboriculturist Violence: Not At Risk  . Fear of Current or Ex-Partner: No  . Emotionally Abused: No  . Physically Abused: No  . Sexually Abused: No    Current Outpatient Medications on File Prior to Visit  Medication Sig Dispense Refill  . albuterol (VENTOLIN HFA) 108 (90 Base) MCG/ACT inhaler Inhale 1-2 puffs into the lungs every 6 (six) hours as needed for wheezing or shortness of breath. 18 g 11  . amLODipine (NORVASC) 2.5 MG tablet Take 1 tablet (2.5 mg total) by mouth daily. 90 tablet 3  . aspirin 81 MG tablet Take 81 mg by mouth daily.    Marland Kitchen atorvastatin (LIPITOR) 20 MG tablet Take 1 tablet (20 mg total) by mouth daily at 6 PM. 90 tablet 3  . calcium carbonate (OSCAL) 1500 (600 Ca) MG TABS tablet Take 600 mg of elemental calcium by mouth daily with breakfast.    . Cholecalciferol (VITAMIN D3) 125 MCG (5000 UT) TABS Take 5,000 Units by mouth daily.    . cyanocobalamin (,VITAMIN B-12,) 1000 MCG/ML injection Inject 1 mL (1,000 mcg total) into the muscle every 30 (thirty) days. 4 mL 12  . diclofenac Sodium (VOLTAREN) 1 % GEL Apply 2 g topically 4 (four) times daily. Prn (Patient taking differently: Apply 2 g topically 4 (four) times daily as needed (pain).) 100 g 11  . Fluocinolone Acetonide 0.01 % OIL Place 5 drops in ear(s) 2 (two) times daily as needed. X1- 2 weeks (Patient taking differently: Place 5 drops in ear(s) 2 (two) times daily as needed.) 20 mL 11  . fluticasone (FLONASE) 50 MCG/ACT nasal spray Place 2 sprays into both nostrils daily. 16 g 11  . Insulin Syringe-Needle U-100 (RELION INSULIN SYR 1CC/30G) 30G X 5/16" 1 ML MISC 1 each by Does not apply route 2 (two) times a day. Use to inject insulin BID; E11.9 100 each 2  . Lancets (ONETOUCH ULTRASOFT) lancets Use as instructed 2x daily Dx: 250.00 100 each 5  . losartan-hydrochlorothiazide (HYZAAR) 100-12.5 MG tablet Take 1 tablet by mouth daily. D/c 100-25 mg dose 90 tablet 3  . MAGNESIUM PO Take 1 tablet by mouth daily.     .  methylPREDNISolone (MEDROL DOSEPAK) 4 MG TBPK tablet Use as directed 21 tablet 0  . metoprolol succinate (TOPROL-XL) 25 MG 24 hr tablet TAKE 1 TABLET BY MOUTH EVERY DAY 90 tablet 3  . omeprazole (PRILOSEC) 40 MG capsule Take 1 capsule (40 mg total) by mouth daily. 90 capsule 3  . ONETOUCH ULTRA test strip TEST TWICE A DAY 100 strip 9  . sodium chloride (OCEAN) 0.65 % SOLN nasal spray Place 1 spray into both nostrils  as needed for congestion. 30 mL 11  . tamsulosin (FLOMAX) 0.4 MG CAPS capsule Take 0.4 mg by mouth daily.    . vitamin E 400 UNIT capsule Take 400 Units by mouth daily.     Current Facility-Administered Medications on File Prior to Visit  Medication Dose Route Frequency Provider Last Rate Last Admin  . cyanocobalamin ((VITAMIN B-12)) injection 1,000 mcg  1,000 mcg Intramuscular Q30 days McLean-Scocuzza, Nino Glow, MD   1,000 mcg at 08/01/20 1027    Allergies  Allergen Reactions  . Sulfamethoxazole-Trimethoprim Rash    Family History  Problem Relation Age of Onset  . Diabetes Mother   . Depression Mother   . Prostate cancer Brother   . Diabetes Brother   . Heart disease Brother   . Diabetes Brother   . Colon cancer Neg Hx   . Esophageal cancer Neg Hx   . Rectal cancer Neg Hx   . Stomach cancer Neg Hx     BP (!) 144/68 (BP Location: Right Arm, Patient Position: Sitting, Cuff Size: Large)   Pulse 78   Ht 6' (1.829 m)   Wt 226 lb 9.6 oz (102.8 kg)   SpO2 97%   BMI 30.73 kg/m    Review of Systems     Objective:   Physical Exam VITAL SIGNS:  See vs page GENERAL: no distress Pulses: dorsalis pedis intact bilat.   MSK: no deformity of the feet CV: no leg edema Skin:  no ulcer on the feet.  normal color and temp on the feet. Neuro: sensation is intact to touch on the feet.   Lab Results  Component Value Date   HGBA1C 7.2 (A) 04/11/2021        Assessment & Plan:  Insulin-requiring type 2 DM. Hypoglycemia, due to insulin: The pattern of his cbg's  indicates he needs some adjustment in his therapy.   Patient Instructions  Your blood pressure is high today.  Please see your primary care provider soon, to have it rechecked Please change the insulin to 190 units with breakfast, and 30 units with supper.   check your blood sugar twice a day.  vary the time of day when you check, between before the 3 meals, and at bedtime.  also check if you have symptoms of your blood sugar being too high or too low.  please keep a record of the readings and bring it to your next appointment here (or you can bring the meter itself).  You can write it on any piece of paper.  please call us sooner if your blood sugar goes below 70, or if you have a lot of readings over 200.   Please come back for a follow-up appointment in 4 months.

## 2021-04-11 NOTE — Patient Instructions (Addendum)
Your blood pressure is high today.  Please see your primary care provider soon, to have it rechecked Please change the insulin to 190 units with breakfast, and 30 units with supper.   check your blood sugar twice a day.  vary the time of day when you check, between before the 3 meals, and at bedtime.  also check if you have symptoms of your blood sugar being too high or too low.  please keep a record of the readings and bring it to your next appointment here (or you can bring the meter itself).  You can write it on any piece of paper.  please call us sooner if your blood sugar goes below 70, or if you have a lot of readings over 200.   Please come back for a follow-up appointment in 4 months.

## 2021-04-16 MED ORDER — SHINGRIX 50 MCG/0.5ML IM SUSR: 0.5000 mL | Freq: Once | INTRAMUSCULAR | 0 refills | Status: AC

## 2021-04-16 MED ORDER — ATORVASTATIN CALCIUM 20 MG PO TABS: 20.0000 mg | ORAL_TABLET | Freq: Every day | ORAL | 3 refills | Status: DC

## 2021-04-16 MED ORDER — AMLODIPINE BESYLATE 2.5 MG PO TABS: 2.5000 mg | ORAL_TABLET | Freq: Every day | ORAL | 3 refills | Status: DC

## 2021-04-16 MED ORDER — ALBUTEROL SULFATE HFA 108 (90 BASE) MCG/ACT IN AERS: 1.0000 | INHALATION_SPRAY | Freq: Four times a day (QID) | RESPIRATORY_TRACT | 11 refills | Status: DC | PRN

## 2021-04-16 MED ORDER — OMEPRAZOLE 40 MG PO CPDR: 40.0000 mg | DELAYED_RELEASE_CAPSULE | Freq: Every day | ORAL | 3 refills | Status: DC

## 2021-04-17 DIAGNOSIS — E113393 Type 2 diabetes mellitus with moderate nonproliferative diabetic retinopathy without macular edema, bilateral: Secondary | ICD-10-CM | POA: Diagnosis not present

## 2021-04-17 LAB — HM DIABETES EYE EXAM

## 2021-04-18 ENCOUNTER — Encounter: Payer: Self-pay | Admitting: Internal Medicine

## 2021-04-26 NOTE — Progress Notes (Signed)
[name] inform pt will have our pharmacy catie contact him to get freestyle libre approved and show how to use  Work with him on diabetes meds to prevent low sugars and cost of meds

## 2021-04-26 NOTE — Addendum Note (Signed)
Addended by: Orland Mustard on: 04/26/2021 06:20 PM   Modules accepted: Orders

## 2021-04-26 NOTE — Progress Notes (Signed)
Arianna inform pt will have our pharmacy catie contact him to get freestyle libre approved and show how to use  Work with him on diabetes meds to prevent low sugars and cost of meds

## 2021-04-29 ENCOUNTER — Telehealth: Payer: Self-pay

## 2021-04-29 NOTE — Chronic Care Management (AMB) (Signed)
  Chronic Care Management   Outreach Note  04/29/2021 Name: [name] MRN: [id number] DOB: [birth year]  [name] is a 75 y.o. year old male who is a primary care patient of McLean-Scocuzza, Nino Glow, MD. I reached out to Sarajane Marek by phone today in response to a referral sent by [name] PCP, McLean-Scocuzza, Nino Glow, MD     An unsuccessful telephone outreach was attempted today. The patient was referred to the case management team for assistance with care management and care coordination.   Follow Up Plan: A HIPAA compliant phone message was left for the patient providing contact information and requesting a return call.  The care management team will reach out to the patient again over the next 5 days.  If patient returns call to provider office, please advise to call [facility] at [place], [place], [place], [place] [address] Direct Dial: [phone] [email or web address] Website: [email or web address]

## 2021-05-02 NOTE — Chronic Care Management (AMB) (Signed)
  Chronic Care Management   Outreach Note  05/02/2021 Name: [name] MRN: [id number] DOB: 07-14-1946  [name] is a 75 y.o. year old male who is a primary care patient of McLean-Scocuzza, Nino Glow, MD. I reached out to Sarajane Marek by phone today in response to a referral sent by [name] PCP, McLean-Scocuzza      A second unsuccessful telephone outreach was attempted today. The patient was referred to the case management team for assistance with care management and care coordination.   Follow Up Plan: A HIPAA compliant phone message was left for the patient providing contact information and requesting a return call.  The care management team will reach out to the patient again over the next 7 days.  If patient returns call to provider office, please advise to call [facility] at [place], [address], [place], [place] [address] Direct Dial: [phone] [email or web address]

## 2021-05-03 NOTE — Chronic Care Management (AMB) (Signed)
  Chronic Care Management   Note  05/03/2021 Name: [name] MRN: [id number] DOB: 1946/02/20  [name] is a 75 y.o. year old male who is a primary care patient of McLean-Scocuzza, Nino Glow, MD. I reached out to Sarajane Marek by phone today in response to a referral sent by [name]'s McLean-Scocuzza, Nino Glow, MD     [name] was given information about Chronic Care Management services today including:  CCM service includes personalized support from designated clinical staff supervised by his physician, including individualized plan of care and coordination with other care providers 24/7 contact phone numbers for assistance for urgent and routine care needs. Service will only be billed when office clinical staff spend 20 minutes or more in a month to coordinate care. Only one practitioner may furnish and bill the service in a calendar month. The patient may stop CCM services at any time (effective at the end of the month) by phone call to the office staff. The patient will be responsible for cost sharing (co-pay) of up to 20% of the service fee (after annual deductible is met).  Patient agreed to services and verbal consent obtained.   Follow up plan: Telephone appointment with care management team member scheduled for: 05/08/2021  Noreene Larsson, [place], [place], [place] [address] Direct Dial: [phone] [email or web address]: [email or web address]

## 2021-05-08 ENCOUNTER — Ambulatory Visit (INDEPENDENT_AMBULATORY_CARE_PROVIDER_SITE_OTHER): Payer: PPO | Admitting: Pharmacist

## 2021-05-08 DIAGNOSIS — E785 Hyperlipidemia, unspecified: Secondary | ICD-10-CM

## 2021-05-08 DIAGNOSIS — E1159 Type 2 diabetes mellitus with other circulatory complications: Secondary | ICD-10-CM

## 2021-05-08 DIAGNOSIS — I152 Hypertension secondary to endocrine disorders: Secondary | ICD-10-CM | POA: Diagnosis not present

## 2021-05-08 DIAGNOSIS — Z794 Long term (current) use of insulin: Secondary | ICD-10-CM | POA: Diagnosis not present

## 2021-05-08 DIAGNOSIS — E1165 Type 2 diabetes mellitus with hyperglycemia: Secondary | ICD-10-CM | POA: Diagnosis not present

## 2021-05-08 DIAGNOSIS — N400 Enlarged prostate without lower urinary tract symptoms: Secondary | ICD-10-CM

## 2021-05-08 MED ORDER — FREESTYLE LIBRE 2 READER DEVI: 1 refills | Status: DC

## 2021-05-08 MED ORDER — FREESTYLE LIBRE 2 SENSOR MISC: 11 refills | Status: DC

## 2021-05-08 NOTE — Chronic Care Management (AMB) (Signed)
Chronic Care Management Pharmacy Note  05/08/2021 Name:  [name] MRN:  [id number] DOB:  11-15-1946  Subjective: [name] is an 75 y.o. year old male who is a primary patient of McLean-Scocuzza, Nino Glow, MD.  The CCM team was consulted for assistance with disease management and care coordination needs.    Engaged with patient by telephone for initial visit in response to provider referral for pharmacy case management and/or care coordination services.   Consent to Services:  The patient was given the following information about Chronic Care Management services today, agreed to services, and gave verbal consent: 1. CCM service includes personalized support from designated clinical staff supervised by the primary care provider, including individualized plan of care and coordination with other care providers 2. 24/7 contact phone numbers for assistance for urgent and routine care needs. 3. Service will only be billed when office clinical staff spend 20 minutes or more in a month to coordinate care. 4. Only one practitioner may furnish and bill the service in a calendar month. 5.The patient may stop CCM services at any time (effective at the end of the month) by phone call to the office staff. 6. The patient will be responsible for cost sharing (co-pay) of up to 20% of the service fee (after annual deductible is met). Patient agreed to services and consent obtained.  Patient Care Team: McLean-Scocuzza, Nino Glow, MD as PCP - General (Internal Medicine) De Hollingshead, RPH-CPP (Pharmacist)  Recent office visits: 5/6 - PCP - having hypoglycemic episodes; having dizziness episodes - reduced HCTZ   Recent consult visits: 5/19 Loanne Drilling endocrinology - adjusted 70/30 insulin to 190 units QAM, 30 units w/ supper. A1c 7.2%  Hospital visits: None in previous 6 months  Objective:  Lab Results  Component Value Date   CREATININE 1.40 (H) 03/15/2021   CREATININE 1.34 09/25/2020    CREATININE 1.44 05/29/2020    Lab Results  Component Value Date   HGBA1C 7.2 (A) 04/11/2021   Last diabetic Eye exam:  Lab Results  Component Value Date/Time   HMDIABEYEEXA Retinopathy (A) 04/17/2021 12:00 AM    Last diabetic Foot exam: No results found for: HMDIABFOOTEX      Component Value Date/Time   CHOL 80 05/29/2020 0919   TRIG 63.0 05/29/2020 0919   HDL 38.30 (L) 05/29/2020 0919   CHOLHDL 2 05/29/2020 0919   VLDL 12.6 05/29/2020 0919   LDLCALC 29 05/29/2020 0919   LDLDIRECT 46.0 08/31/2018 1106    Hepatic Function Latest Ref Rng & Units 09/25/2020 05/29/2020 06/01/2019  Total Protein 6.0 - 8.3 g/dL 7.2 7.2 7.3  Albumin 3.5 - 5.2 g/dL 4.2 4.4 4.3  AST 0 - 37 U/L _0 ALT 0 - 53 U/L _1 Alk Phosphatase 39 - 117 U/L 57 55 50  Total Bilirubin 0.2 - 1.2 mg/dL 0.7 1.0 0.8  Bilirubin, Direct 0.0 - 0.3 mg/dL - - -    Lab Results  Component Value Date/Time   TSH 1.89 05/29/2020 09:19 AM   TSH 0.96 12/02/2018 08:03 AM    CBC Latest Ref Rng & Units 03/15/2021 05/29/2020 06/01/2019  WBC 4.0 - 10.5 K/uL 7.0 7.4 6.3  Hemoglobin 13.0 - 17.0 g/dL 17.2(H) 16.5 17.3(H)  Hematocrit 39.0 - 52.0 % 47.2 47.4 49.8  Platelets 150 - 400 K/uL 186 180.0 168.0    Lab Results  Component Value Date/Time   VD25OH 60.03 05/29/2020 09:19 AM   VD25OH 51.09 06/01/2019 08:10  AM    Clinical ASCVD: Yes - CAD     Social History   Tobacco Use  Smoking Status Never  Smokeless Tobacco Never   BP Readings from Last 3 Encounters:  04/11/21 (!) 144/68  03/29/21 124/60  11/28/20 130/64   Pulse Readings from Last 3 Encounters:  04/11/21 78  03/29/21 84  11/28/20 80   Wt Readings from Last 3 Encounters:  04/11/21 226 lb 9.6 oz (102.8 kg)  03/29/21 225 lb (102.1 kg)  01/23/21 220 lb (99.8 kg)    Assessment: Review of patient past medical history, allergies, medications, health status, including review of consultants reports, laboratory and other test data, was performed as  part of comprehensive evaluation and provision of chronic care management services.   SDOH:  (Social Determinants of Health) assessments and interventions performed:  SDOH Interventions    Flowsheet Row Most Recent Value  SDOH Interventions   Financial Strain Interventions Intervention Not Indicated       CCM Care Plan  Allergies  Allergen Reactions   Sulfamethoxazole-Trimethoprim Rash    Medications Reviewed Today     Reviewed by De Hollingshead, RPH-CPP (Pharmacist) on 05/08/21 at 1519  Med List Status: <None>   Medication Order Taking? Sig Documenting Provider Last Dose Status Informant  albuterol (VENTOLIN HFA) 108 (90 Base) MCG/ACT inhaler [id number]  Inhale 1-2 puffs into the lungs every 6 (six) hours as needed for wheezing or shortness of breath. McLean-Scocuzza, Nino Glow, MD  Active   amLODipine (NORVASC) 2.5 MG tablet [id number] Yes Take 1 tablet (2.5 mg total) by mouth daily. McLean-Scocuzza, Nino Glow, MD Taking Active   Ascorbic Acid (VITAMIN C) 1000 MG tablet [id number] Yes Take 1,000 mg by mouth daily. [provider] Taking Active   aspirin 81 MG tablet [id number] Yes Take 81 mg by mouth daily. [provider] Taking Active Self  atorvastatin (LIPITOR) 20 MG tablet [id number] Yes Take 1 tablet (20 mg total) by mouth daily at 6 PM. McLean-Scocuzza, Nino Glow, MD Taking Active   calcium carbonate (OSCAL) 1500 (600 Ca) MG TABS tablet [id number] Yes Take 600 mg of elemental calcium by mouth daily with breakfast. [provider] Taking Active Self  cyanocobalamin ((VITAMIN B-12)) injection 1,000 mcg [id number]   McLean-Scocuzza, Nino Glow, MD  Active   cyanocobalamin (,VITAMIN B-12,) 1000 MCG/ML injection [id number] No Inject 1 mL (1,000 mcg total) into the muscle every 30 (thirty) days.  Patient not taking: Reported on 05/08/2021   McLean-Scocuzza, Nino Glow, MD Not Taking Active Self  diclofenac Sodium (VOLTAREN) 1 % GEL [id number] Yes Apply 2 g topically 4  (four) times daily. Prn  Patient taking differently: Apply 2 g topically 4 (four) times daily as needed (pain).   McLean-Scocuzza, Nino Glow, MD Taking Active   Fluocinolone Acetonide 0.01 % OIL [id number] Yes Place 5 drops in ear(s) 2 (two) times daily as needed. X1- 2 weeks  Patient taking differently: Place 5 drops in ear(s) as needed.   McLean-Scocuzza, Nino Glow, MD Taking Active   fluticasone [facility]) 50 MCG/ACT nasal spray [id number] No Place 2 sprays into both nostrils daily.  Patient not taking: Reported on 05/08/2021   McLean-Scocuzza, Nino Glow, MD Not Taking Active   insulin NPH-regular Human (NOVOLIN 70/30 RELION) (70-30) 100 UNIT/ML injection [id number] Yes 190 UNITS WITH BREAKFAST AND 30 UNITS WITH SUPPER. Renato Shin, MD Taking Active   Insulin Syringe-Needle U-100 (RELION INSULIN SYR 1CC/30G) 30G X 5/16" 1 ML [place] [id number] Yes 1 each by Does not apply  route 2 (two) times a day. Use to inject insulin BID; E11.9 Renato Shin, MD Taking Active Self  Lancets [facility] ULTRASOFT) lancets [id number] Yes Use as instructed 2x daily Dx: 250.00 Renato Shin, MD Taking Active Self  losartan-hydrochlorothiazide [facility]) 100-12.5 MG tablet [id number] Yes Take 1 tablet by mouth daily. D/c 100-25 mg dose McLean-Scocuzza, Nino Glow, MD Taking Active   metoprolol succinate (TOPROL-XL) 25 MG 24 hr tablet [id number] Yes TAKE 1 TABLET BY MOUTH EVERY DAY McLean-Scocuzza, Nino Glow, MD Taking Active   omeprazole (PRILOSEC) 40 MG capsule [id number] Yes Take 1 capsule (40 mg total) by mouth daily. McLean-Scocuzza, Nino Glow, MD Taking Active   [facility] ULTRA test strip [id number] Yes TEST TWICE A Marella Chimes, MD Taking Active Self  Potassium 99 MG TABS [id number] Yes Take by mouth. [provider] Taking Active   sodium chloride (OCEAN) 0.65 % SOLN nasal spray [id number]  Place 1 spray into both nostrils as needed for congestion. McLean-Scocuzza, Nino Glow, MD  Active Self  tamsulosin (FLOMAX) 0.4 MG CAPS  capsule [id number] Yes Take 0.4 mg by mouth daily. [provider] Taking Active Self  vitamin E 400 UNIT capsule [id number] Yes Take 400 Units by mouth daily. 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   Cholecystitis 11/11/2016   Chest pain 09/26/2016   Diplopia 08/21/2016   Renal failure 08/21/2016   Dyspnea on exertion 07/09/2016   Diabetes ([place]) 03/29/2016   Hematuria 08/07/2014   Routine general medical examination at a health care facility 09/08/2013   Hearing loss of aging 09/07/2013   Screening for prostate cancer 05/04/2013   Encounter for long-term (current) use of other medications 05/04/2013   Contusion of ankle or foot, right 05/07/2012   KNEE PAIN, RIGHT 12/20/2010   PROTEINURIA, MILD 05/14/2010   HIP PAIN, LEFT 11/13/2009   ESOPHAGEAL STRICTURE 12/28/2008   GERD 11/28/2008   Chronic constipation 11/28/2008   OTHER DYSPHAGIA 11/28/2008   HEMOCHROMATOSIS 11/28/2008   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[place]) 09/29/2008   URINARY CALCULUS 09/29/2008   OSTEOARTHRITIS, 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   DYSPHAGIA UNSPECIFIED 09/29/2008   Hypertension associated with diabetes ([place]) 01/07/2008   PAIN IN SOFT TISSUES OF LIMB 01/07/2008   UNSPECIFIED ANEMIA 12/29/2007   Dyslipidemia 08/03/2007   Allergic rhinitis 08/03/2007   BPH (benign prostatic hyperplasia) 08/03/2007   COLONIC POLYPS, HX OF 08/03/2007    Immunization History  Administered Date(s) Administered   Fluad  Quad(high Dose 65+) 07/27/2019, 08/01/2020   Influenza Whole 08/24/2009, 08/25/2011   Influenza, High Dose Seasonal PF 08/21/2016, 08/25/2017, 08/31/2018   Influenza,inj,Quad PF,6+ Mos 10/29/2015   Influenza-Unspecified 10/29/2015   Moderna SARS-COV2 Booster Vaccination 11/24/2020, 03/19/2021   Moderna Sars-Covid-2 Vaccination 01/05/2020, 02/02/2020   Pneumococcal Conjugate-13 01/23/2017   Pneumococcal Polysaccharide-23 09/25/2003, 03/27/2015   Td 09/25/2003   Tdap 08/31/2018    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    Interventions: 1:1 collaboration with McLean-Scocuzza, Nino Glow, MD regarding development and update of comprehensive plan of care as evidenced by provider attestation and co-signature Inter-disciplinary care team collaboration (see longitudinal plan of care) Comprehensive medication review performed; medication list updated in electronic medical record  Diabetes: Uncontrolled and suboptimally managed; current treatment: Novolin OTC 70/30 190 units QAM, 30 units QPM - though reports he often adjusts his QPM regimen based upon anticipated meals, often does not adjust appropriate and will have high readings later in the evening. Does not report hypoglycemic events overnight, but may not be waking up ; followed by endocrinology Dr. Loanne Drilling Hx metformin: stopped by Dr. Loanne Drilling in 2014,  documented reason to simplify regimen Hx pioglitazone: stopped by Dr. Loanne Drilling in 2014, documented reason to simplify regimen Hx glimepiride: stopped by Dr. Loanne Drilling in 2014, documented reason to simplify regimen Interested in [place] CGM Educated on CGM, including use, benefits. Patient interested. CGM is covered for a $0 copay through [employer] at local pharmacies. Script sent for [place] 2 reader and sensors. Patient scheduled for nurse visit when he gets back from vacation in July to be educated on CGM system Extensively discussed cardiovascular and renal benefits of newer agents, such as SGLT2 and GLP1s. Discussed inherent risks of glycemic fluctuation, hypoglycemic episodes, and weight gain with insulin regimen. Discussed preference to incorporate medications such as metformin, GLP1, SGLT2 (preferential first step would be combo metformin + SGLT2 w/ empiric insulin reduction) to improve safety and long term outcomes of therapy. Patient assistance can be navigated by myself. Patient will consider.   Hypertension: Controlled but fluctuant; current treatment: amlodipine 2.5 mg QPM, metoprolol succinate 25 mg QPM, losartan/HCTZ 100/12.5 mg QAM - reports that he is thinking about holding his losartan/HCTZ due to an episode w/ BP of 88/44 yesterday with lightheadedness. Did not lose consciousness. He did not eat breakfast (did skip insulin) but went outside around 11 am to work outside. Did not hydrate well.   Current home readings: this morning was 116/65 Educated on importance of adequate hydration, especially before being outside in 90+ degree weather like yesterday. Educated on nephroprotective benefit of losartan. Discussed that he would like to wait and take losartan/HCTZ later in the day when he comes back in from being outside. Given pre-losartan/HCTZ BP was at goal this morning, I think this is appropriate. Continue losartan/HCTZ 100/12.5 mg daily (after being outside for the day), amlodipine  2.5 mg QPM and metoprolol succinate 25 mg QPM.  Discussed that if continued hypotensive episodes, we would prefer to eliminate HCTZ or amlodipine rather than eliminate the entire losartan/HCTZ pill altogether. He verbalized understanding.   Hyperlipidemia and ASCVD risk reduction: Controlled; current treatment: atorvastatin 20 mg daily;  Antiplatelet regimen: aspirin 81 mg daily  Recommend to continue current regimen at this time  GERD: Controlled per patient report; current regimen: omeprazole 20 mg daily Continue current regimen at this time  BPH: Controlled per patient report; current regimen: tamsulosin 0.4 mg daily Continue current regimen at this time  Health Maintenance: Due for shingrix vaccination. Will discuss moving forward   Patient Goals/Self-Care Activities Over the next 90 days, patient will:  - take medications as prescribed check glucose three times daily using CGM, document, and provide at future appointments check blood pressure daily, document, and provide at future appointments collaborate with provider on medication access solutions  Follow Up Plan: Face to Face appointment with care management team member scheduled for:  ~ 8 weeks      Medication Assistance: None required.  Patient affirms current coverage meets needs.  Patient's preferred pharmacy is:  [facility] [address], [place] - [place] [address] Phone: [phone] Fax: [place], [place] [address] [address] Phone: [phone] Fax: [phone]  [id number] [place]Phone: [phone]: [phone] [place] [address] [address]Phone: [phone]: [phone]  Follow Up:   Patient agrees to Care Plan and Follow-up.  Plan: Face to Face appointment with care management team member scheduled for: 8 weeks  Catie TDarnelle Maffucci PharmD, BClay CLost NationClinical Pharmacist LOccidental Petroleumat BJohnson & [phone]

## 2021-05-08 NOTE — Patient Instructions (Signed)
Visit Information   PATIENT GOALS:   Goals Addressed               This Visit's Progress     Patient Stated     Medication Monitoring (pt-stated)        Patient Goals/Self-Care Activities Over the next 90 days, patient will:  - take medications as prescribed check glucose three times daily using CGM, document, and provide at future appointments check blood pressure daily, document, and provide at future appointments collaborate with provider on medication access solutions         Consent to CCM Services: [name] was given information about Chronic Care Management services today including:  CCM service includes personalized support from designated clinical staff supervised by his physician, including individualized plan of care and coordination with other care providers 24/7 contact phone numbers for assistance for urgent and routine care needs. Service will only be billed when office clinical staff spend 20 minutes or more in a month to coordinate care. Only one practitioner may furnish and bill the service in a calendar month. The patient may stop CCM services at any time (effective at the end of the month) by phone call to the office staff. The patient will be responsible for cost sharing (co-pay) of up to 20% of the service fee (after annual deductible is met).  Patient agreed to services and verbal consent obtained.   Patient verbalizes understanding of instructions provided today and agrees to view in [place].   Plan: Face to Face appointment with care management team member scheduled for: 8 weeks  Catie Darnelle Maffucci, PharmD, [place], CPP Clinical Pharmacist [place] [facility]: Patient Care Plan: Medication Management     Problem Identified: Diabetes, Hypertension, Hyperlipidemia      Long-Range Goal: Disease Progression Prevention   Start Date: 05/08/2021  This Visit's Progress: On track  Priority: High   Note:   Current Barriers:  Unable to achieve control of diabetes  Suboptimal therapeutic regimen for diabetes  Pharmacist Clinical Goal(s):  Over the next 90 days, patient will achieve control of diabetes as evidenced by A1c  through collaboration with PharmD and provider.    Interventions: 1:1 collaboration with McLean-Scocuzza, Nino Glow, MD regarding development and update of comprehensive plan of care as evidenced by provider attestation and co-signature Inter-disciplinary care team collaboration (see longitudinal plan of care) Comprehensive medication review performed; medication list updated in electronic medical record  Diabetes: Uncontrolled and suboptimally managed; current treatment: Novolin OTC 70/30 190 units QAM, 30 units QPM - though reports he often adjusts his QPM regimen based upon anticipated meals, often does not adjust appropriate and will have high readings later in the evening. Does not report hypoglycemic events overnight, but may not be waking up ; followed by endocrinology Dr. Loanne Drilling Hx metformin: stopped by Dr. Loanne Drilling in 2014, documented reason to simplify regimen Hx pioglitazone: stopped by Dr. Loanne Drilling in 2014, documented reason to simplify regimen Hx glimepiride: stopped by Dr. Loanne Drilling in 2014, documented reason to simplify regimen Interested in [place] CGM Educated on CGM, including use, benefits. Patient interested. CGM is covered for a $0 copay through [employer] at local pharmacies. Script sent for [place] 2 reader and sensors. Patient scheduled for nurse visit when he gets back from vacation in July to be educated on CGM system Extensively discussed cardiovascular and renal benefits of newer agents, such as SGLT2 and GLP1s. Discussed inherent risks of glycemic fluctuation, hypoglycemic episodes, and weight gain  with insulin regimen. Discussed preference to incorporate medications such as metformin, GLP1, SGLT2 (preferential first step would be combo  metformin + SGLT2 w/ empiric insulin reduction) to improve safety and long term outcomes of therapy. Patient assistance can be navigated by myself. Patient will consider.   Hypertension: Controlled but fluctuant; current treatment: amlodipine 2.5 mg QPM, metoprolol succinate 25 mg QPM, losartan/HCTZ 100/12.5 mg QAM - reports that he is thinking about holding his losartan/HCTZ due to an episode w/ BP of 88/44 yesterday with lightheadedness. Did not lose consciousness. He did not eat breakfast (did skip insulin) but went outside around 11 am to work outside. Did not hydrate well.   Current home readings: this morning was 116/65 Educated on importance of adequate hydration, especially before being outside in 90+ degree weather like yesterday. Educated on nephroprotective benefit of losartan. Discussed that he would like to wait and take losartan/HCTZ later in the day when he comes back in from being outside. Given pre-losartan/HCTZ BP was at goal this morning, I think this is appropriate. Continue losartan/HCTZ 100/12.5 mg daily (after being outside for the day), amlodipine 2.5 mg QPM and metoprolol succinate 25 mg QPM.  Discussed that if continued hypotensive episodes, we would prefer to eliminate HCTZ or amlodipine rather than eliminate the entire losartan/HCTZ pill altogether. He verbalized understanding.   Hyperlipidemia and ASCVD risk reduction: Controlled; current treatment: atorvastatin 20 mg daily;  Antiplatelet regimen: aspirin 81 mg daily  Recommend to continue current regimen at this time  GERD: Controlled per patient report; current regimen: omeprazole 20 mg daily Continue current regimen at this time  BPH: Controlled per patient report; current regimen: tamsulosin 0.4 mg daily Continue current regimen at this time  Health Maintenance: Due for shingrix vaccination. Will discuss moving forward   Patient Goals/Self-Care Activities Over the next 90 days, patient will:  - take  medications as prescribed check glucose three times daily using CGM, document, and provide at future appointments check blood pressure daily, document, and provide at future appointments collaborate with provider on medication access solutions  Follow Up Plan: Face to Face appointment with care management team member scheduled for:  ~ 8 weeks

## 2021-05-14 ENCOUNTER — Telehealth: Payer: Self-pay | Admitting: Internal Medicine

## 2021-05-14 NOTE — Telephone Encounter (Signed)
Lft pt vm to call ofc to sch DG barium swallow. thanks

## 2021-05-30 ENCOUNTER — Other Ambulatory Visit: Payer: Self-pay

## 2021-05-30 ENCOUNTER — Ambulatory Visit: Payer: PPO

## 2021-05-30 DIAGNOSIS — Z794 Long term (current) use of insulin: Secondary | ICD-10-CM

## 2021-05-30 DIAGNOSIS — E1165 Type 2 diabetes mellitus with hyperglycemia: Secondary | ICD-10-CM

## 2021-05-30 NOTE — Progress Notes (Signed)
Patient presented for [facility] placement and instruction. Per Providers order from 05-08-21. Patient voiced no concerns nor showed signs of distress during placement. Also instructed patient on how to record so we can have accurate monitoring system. Instructed patient that if further questions, they can call clinic to be given further direction.

## 2021-06-11 ENCOUNTER — Ambulatory Visit: Payer: PPO

## 2021-06-11 ENCOUNTER — Other Ambulatory Visit: Payer: Self-pay

## 2021-06-11 DIAGNOSIS — Z794 Long term (current) use of insulin: Secondary | ICD-10-CM

## 2021-06-11 DIAGNOSIS — E1165 Type 2 diabetes mellitus with hyperglycemia: Secondary | ICD-10-CM

## 2021-06-11 NOTE — Progress Notes (Addendum)
Patient education given on How to attach a [employer] and the patient expresses understanding and acceptance of instructions. [name] [name] 06/11/2021 2:28 PM

## 2021-06-19 ENCOUNTER — Ambulatory Visit: Payer: PPO | Admitting: Dermatology

## 2021-06-19 ENCOUNTER — Other Ambulatory Visit: Payer: Self-pay

## 2021-06-19 DIAGNOSIS — L578 Other skin changes due to chronic exposure to nonionizing radiation: Secondary | ICD-10-CM

## 2021-06-19 DIAGNOSIS — L918 Other hypertrophic disorders of the skin: Secondary | ICD-10-CM | POA: Diagnosis not present

## 2021-06-19 DIAGNOSIS — L570 Actinic keratosis: Secondary | ICD-10-CM

## 2021-06-19 DIAGNOSIS — L219 Seborrheic dermatitis, unspecified: Secondary | ICD-10-CM | POA: Diagnosis not present

## 2021-06-19 MED ORDER — TRIAMCINOLONE ACETONIDE 0.1 % EX OINT: 1.0000 "application " | TOPICAL_OINTMENT | Freq: Two times a day (BID) | CUTANEOUS | 3 refills | Status: DC | PRN

## 2021-06-19 MED ORDER — KETOCONAZOLE 2 % EX CREA: 1.0000 "application " | TOPICAL_CREAM | Freq: Two times a day (BID) | CUTANEOUS | 11 refills | Status: AC

## 2021-06-19 NOTE — Progress Notes (Signed)
Follow-Up Visit   Subjective  [name] is a 75 y.o. male who presents for the following: Follow-up (Patient here for follow up on aks at left lateral brow x 1, right helix x 1, right cheek x 1, top of nose x 1, left temple x 1. Patient also reports a few spots under arms he would liked looked at today. ).  He is also bothered by rash at ears.  The following portions of the chart were reviewed this encounter and updated as appropriate:  Tobacco  Allergies  Meds  Problems  Med Hx  Surg Hx  Fam Hx       Objective  Well appearing patient in no apparent distress; mood and affect are within normal limits.  A focused examination was performed including face, ears, scalp and bilateral axilla. Relevant physical exam findings are noted in the Assessment and Plan.  left lateral brow, right helix, right cheek, top of nose, left temple Clear today  Left Ear Pink patches with greasy scale.   Assessment & Plan  Actinic keratosis left lateral brow, right helix, right cheek, top of nose, left temple  History of actinic keratosis - Monitor for recurrence  Actinic keratoses are precancerous spots that appear secondary to cumulative UV radiation exposure/sun exposure over time. They are chronic with expected duration over 1 year. A portion of actinic keratoses will progress to squamous cell carcinoma of the skin. It is not possible to reliably predict which spots will progress to skin cancer and so treatment is recommended to prevent development of skin cancer.  Recommend daily broad spectrum sunscreen SPF 30+ to sun-exposed areas, reapply every 2 hours as needed.  Recommend staying in the shade or wearing long sleeves, sun glasses (UVA+UVB protection) and wide brim hats (4-inch brim around the entire circumference of the hat). Call for new or changing lesions.  Seborrheic dermatitis Left Ear  Chronic condition with duration or expected duration over one year. Condition is bothersome  to patient. Currently flared at ears  Seborrheic Dermatitis  -  is a chronic persistent rash characterized by pinkness and scaling most commonly of the mid face but also can occur on the scalp (dandruff), ears; mid chest and mid back. It tends to be exacerbated by stress and cooler weather.  People who have neurologic disease may experience new onset or exacerbation of existing seborrheic dermatitis.  The condition is not curable but treatable and can be controlled.  Start ketoconazole cream apply twice a day to areas of rash at the ears. Can use daily for prevention.  Start triamcinolone - Apply to rash at ears twice a day as needed for up to 2 weeks at a time. Avoid applying to face, groin, and axilla.  Topical steroids (such as triamcinolone, fluocinolone, fluocinonide, mometasone, clobetasol, halobetasol, betamethasone, hydrocortisone) can cause thinning and lightening of the skin if they are used for too long in the same area. Your physician has selected the right strength medicine for your problem and area affected on the body. Please use your medication only as directed by your physician to prevent side effects.    ketoconazole (NIZORAL) 2 % cream - Left Ear Apply 1 application topically 2 (two) times daily. Safe for everyday use - apply twice a day to areas of rash at the ears. Can use daily for prevention.  triamcinolone ointment (KENALOG) 0.1 % - Left Ear Apply 1 application topically 2 (two) times daily as needed (Rash). Apply to rash at ears twice a day  as needed for up to 2 weeks at a time. Avoid applying to face, groin, and axilla  Acrochordons (Skin Tags) - Fleshy, skin-colored pedunculated papules at bilateral axilla - Benign appearing.  - Observe. - If desired, they can be removed with an in office procedure that is not covered by insurance. - Please call the clinic if you notice any new or changing lesions.  Actinic Damage - chronic, secondary to cumulative UV radiation  exposure/sun exposure over time - diffuse scaly erythematous macules with underlying dyspigmentation - Recommend daily broad spectrum sunscreen SPF 30+ to sun-exposed areas, reapply every 2 hours as needed.  - Recommend staying in the shade or wearing long sleeves, sun glasses (UVA+UVB protection) and wide brim hats (4-inch brim around the entire circumference of the hat). - Call for new or changing lesions.  Return for 1 year tbse . I, Ruthell Rummage, CMA, am acting as scribe for Forest Gleason, MD.  Documentation: I have reviewed the above documentation for accuracy and completeness, and I agree with the above.  Forest Gleason, MD

## 2021-06-19 NOTE — Patient Instructions (Addendum)
Recommend daily broad spectrum sunscreen SPF 30+ to sun-exposed areas, reapply every 2 hours as needed. Call for new or changing lesions.  Staying in the shade or wearing long sleeves, sun glasses (UVA+UVB protection) and wide brim hats (4-inch brim around the entire circumference of the hat) are also recommended for sun protection.    Recommend taking Heliocare sun protection supplement daily in sunny weather for additional sun protection. For maximum protection on the sunniest days, you can take up to 2 capsules of regular Heliocare OR take 1 capsule of Heliocare Ultra. For prolonged exposure (such as a full day in the sun), you can repeat your dose of the supplement 4 hours after your first dose. Heliocare can be purchased at [facility] or at [email or web address].    Topical steroids (such as triamcinolone, fluocinolone, fluocinonide, mometasone, clobetasol, halobetasol, betamethasone, hydrocortisone) can cause thinning and lightening of the skin if they are used for too long in the same area. Your physician has selected the right strength medicine for your problem and area affected on the body. Please use your medication only as directed by your physician to prevent side effects.    If you have any questions or concerns for your doctor, please call our main line at [phone] and press option 4 to reach your doctor's medical assistant. If no one answers, please leave a voicemail as directed and we will return your call as soon as possible. Messages left after 4 pm will be answered the following business day.   You may also send [place] a message via [place]. We typically respond to MyChart messages within 1-2 business days.  For prescription refills, please ask your pharmacy to contact our office. Our fax number is [phone].  If you have an urgent issue when the clinic is closed that cannot wait until the next business day, you can page your doctor at the number below.    Please note that  while we do our best to be available for urgent issues outside of office hours, we are not available 24/7.   If you have an urgent issue and are unable to reach [place], you may choose to seek medical care at your doctor's office, retail clinic, urgent care center, or emergency room.  If you have a medical emergency, please immediately call 911 or go to the emergency department.  Pager Numbers  - Dr. Nehemiah Massed: [phone]  - Dr. Laurence Ferrari: [phone]  - Dr. Nicole Kindred: [phone]  In the event of inclement weather, please call our main line at [phone] for an update on the status of any delays or closures.  Dermatology Medication Tips: Please keep the boxes that topical medications come in in order to help keep track of the instructions about where and how to use these. Pharmacies typically print the medication instructions only on the boxes and not directly on the medication tubes.   If your medication is too expensive, please contact our office at [phone] option 4 or send [place] a message through [place].   We are unable to tell what your co-pay for medications will be in advance as this is different depending on your insurance coverage. However, we may be able to find a substitute medication at lower cost or fill out paperwork to get insurance to cover a needed medication.   If a prior authorization is required to get your medication covered by your insurance company, please allow [place] 1-2 business days to complete this process.  Drug prices often vary depending on where the  prescription is filled and some pharmacies may offer cheaper prices.  The website www.goodrx.com contains coupons for medications through different pharmacies. The prices here do not account for what the cost may be with help from insurance (it may be cheaper with your insurance), but the website can give you the price if you did not use any insurance.  - You can print the associated coupon and take it with your  prescription to the pharmacy.  - You may also stop by our office during regular business hours and pick up a GoodRx coupon card.  - If you need your prescription sent electronically to a different pharmacy, notify our office through [facility] or by phone at [phone] option 4.

## 2021-06-21 ENCOUNTER — Other Ambulatory Visit: Payer: Self-pay

## 2021-06-21 ENCOUNTER — Ambulatory Visit (INDEPENDENT_AMBULATORY_CARE_PROVIDER_SITE_OTHER): Payer: PPO | Admitting: Pharmacist

## 2021-06-21 DIAGNOSIS — I152 Hypertension secondary to endocrine disorders: Secondary | ICD-10-CM

## 2021-06-21 DIAGNOSIS — N400 Enlarged prostate without lower urinary tract symptoms: Secondary | ICD-10-CM

## 2021-06-21 DIAGNOSIS — E1159 Type 2 diabetes mellitus with other circulatory complications: Secondary | ICD-10-CM

## 2021-06-21 DIAGNOSIS — E1165 Type 2 diabetes mellitus with hyperglycemia: Secondary | ICD-10-CM | POA: Diagnosis not present

## 2021-06-21 DIAGNOSIS — Z794 Long term (current) use of insulin: Secondary | ICD-10-CM | POA: Diagnosis not present

## 2021-06-21 DIAGNOSIS — E785 Hyperlipidemia, unspecified: Secondary | ICD-10-CM | POA: Diagnosis not present

## 2021-06-21 NOTE — Patient Instructions (Addendum)
It was great to see you today!  When you get home, get out the reader, turn it on, and click "start new sensor". The bandages are called Skin Grip. I ordered these from [employer].   If you have issues, please give me a call!  Take care!  Catie Darnelle Maffucci, PharmD [phone]  Visit Information  PATIENT GOALS:  Goals Addressed               This Visit's Progress     Patient Stated     Medication Monitoring (pt-stated)        Patient Goals/Self-Care Activities Over the next 90 days, patient will:  - take medications as prescribed check glucose three times daily using CGM, document, and provide at future appointments check blood pressure daily, document, and provide at future appointments collaborate with provider on medication access solutions         Patient verbalizes understanding of instructions provided today and agrees to view in [place].   Plan: Face to Face appointment with care management team member scheduled for: 3 weeks  Catie Darnelle Maffucci, PharmD, [place], [place] Clinical Pharmacist [employer] [facility] [employer] [phone]

## 2021-06-21 NOTE — Chronic Care Management (AMB) (Signed)
Chronic Care Management Pharmacy Note  06/21/2021 Name:  [name] MRN:  [id number] DOB:  1946/03/07  Subjective: [name] is an 75 y.o. year old male who is a primary patient of McLean-Scocuzza, Nino Glow, MD.  The CCM team was consulted for assistance with disease management and care coordination needs.    Engaged with patient face to face for follow up visit in response to provider referral for pharmacy case management and/or care coordination services.   Consent to Services:  The patient was given information about Chronic Care Management services, agreed to services, and gave verbal consent prior to initiation of services.  Please see initial visit note for detailed documentation.   Patient Care Team: McLean-Scocuzza, Nino Glow, MD as PCP - General (Internal Medicine) De Hollingshead, RPH-CPP (Pharmacist)   Objective:  Lab Results  Component Value Date   CREATININE 1.40 (H) 03/15/2021   CREATININE 1.34 09/25/2020   CREATININE 1.44 05/29/2020    Lab Results  Component Value Date   HGBA1C 7.2 (A) 04/11/2021   Last diabetic Eye exam:  Lab Results  Component Value Date/Time   HMDIABEYEEXA Retinopathy (A) 04/17/2021 12:00 AM    Last diabetic Foot exam: No results found for: HMDIABFOOTEX      Component Value Date/Time   CHOL 80 05/29/2020 0919   TRIG 63.0 05/29/2020 0919   HDL 38.30 (L) 05/29/2020 0919   CHOLHDL 2 05/29/2020 0919   VLDL 12.6 05/29/2020 0919   LDLCALC 29 05/29/2020 0919   LDLDIRECT 46.0 08/31/2018 1106    Hepatic Function Latest Ref Rng & Units 09/25/2020 05/29/2020 06/01/2019  Total Protein 6.0 - 8.3 g/dL 7.2 7.2 7.3  Albumin 3.5 - 5.2 g/dL 4.2 4.4 4.3  AST 0 - 37 U/L $Remo'19 21 18  'kwXyS$ ALT 0 - 53 U/L $Remo'19 29 19  'VbqGG$ Alk Phosphatase 39 - 117 U/L 57 55 50  Total Bilirubin 0.2 - 1.2 mg/dL 0.7 1.0 0.8  Bilirubin, Direct 0.0 - 0.3 mg/dL - - -    Lab Results  Component Value Date/Time   TSH 1.89 05/29/2020 09:19 AM   TSH 0.96 12/02/2018 08:03 AM     CBC Latest Ref Rng & Units 03/15/2021 05/29/2020 06/01/2019  WBC 4.0 - 10.5 K/uL 7.0 7.4 6.3  Hemoglobin 13.0 - 17.0 g/dL 17.2(H) 16.5 17.3(H)  Hematocrit 39.0 - 52.0 % 47.2 47.4 49.8  Platelets 150 - 400 K/uL 186 180.0 168.0    Lab Results  Component Value Date/Time   VD25OH 60.03 05/29/2020 09:19 AM   VD25OH 51.09 06/01/2019 08:10 AM    Clinical ASCVD: No  The ASCVD Risk score Mikey Bussing DC Jr., et al., 2013) failed to calculate for the following reasons:   The valid total cholesterol range is 130 to 320 mg/dL     Social History   Tobacco Use  Smoking Status Never  Smokeless Tobacco Never   BP Readings from Last 3 Encounters:  04/11/21 (!) 144/68  03/29/21 124/60  11/28/20 130/64   Pulse Readings from Last 3 Encounters:  04/11/21 78  03/29/21 84  11/28/20 80   Wt Readings from Last 3 Encounters:  04/11/21 226 lb 9.6 oz (102.8 kg)  03/29/21 225 lb (102.1 kg)  01/23/21 220 lb (99.8 kg)    Assessment: Review of patient past medical history, allergies, medications, health status, including review of consultants reports, laboratory and other test data, was performed as part of comprehensive evaluation and provision of chronic care management services.   SDOH:  (Social  Determinants of Health) assessments and interventions performed:  SDOH Interventions    Flowsheet Row Most Recent Value  SDOH Interventions   Financial Strain Interventions Intervention Not Indicated       CCM Care Plan  Allergies  Allergen Reactions   Sulfamethoxazole-Trimethoprim Rash    Medications Reviewed Today     Reviewed by De Hollingshead, RPH-CPP (Pharmacist) on 06/21/21 at 0955  Med List Status: <None>   Medication Order Taking? Sig Documenting Provider Last Dose Status Informant  albuterol (VENTOLIN HFA) 108 (90 Base) MCG/ACT inhaler [id number] No Inhale 1-2 puffs into the lungs every 6 (six) hours as needed for wheezing or shortness of breath.  Patient not taking: Reported on  06/21/2021   McLean-Scocuzza, Nino Glow, MD Not Taking Active   amLODipine (NORVASC) 2.5 MG tablet [id number] Yes Take 1 tablet (2.5 mg total) by mouth daily. McLean-Scocuzza, Nino Glow, MD Taking Active   Ascorbic Acid (VITAMIN C) 1000 MG tablet [id number] Yes Take 1,000 mg by mouth daily. [provider] Taking Active   aspirin 81 MG tablet [id number] Yes Take 81 mg by mouth daily. [provider] Taking Active Self  atorvastatin (LIPITOR) 20 MG tablet [id number] Yes Take 1 tablet (20 mg total) by mouth daily at 6 PM. McLean-Scocuzza, Nino Glow, MD Taking Active   calcium carbonate (OSCAL) 1500 (600 Ca) MG TABS tablet [id number] Yes Take 600 mg of elemental calcium by mouth daily with breakfast. [provider] Taking Active Self  Continuous Blood Gluc Receiver (FREESTYLE LIBRE 2 READER) DEVI [id number] No Use to check glucose at least TID  Patient not taking: Reported on 06/21/2021   McLean-Scocuzza, Nino Glow, MD Not Taking Active   Continuous Blood Gluc Sensor (FREESTYLE LIBRE 2 SENSOR) MISC [id number] No Use to check glucose at least three times daily  Patient not taking: Reported on 06/21/2021   McLean-Scocuzza, Nino Glow, MD Not Taking Active   cyanocobalamin ((VITAMIN B-12)) injection 1,000 mcg [id number]   McLean-Scocuzza, Nino Glow, MD  Active   cyanocobalamin (,VITAMIN B-12,) 1000 MCG/ML injection [id number] No Inject 1 mL (1,000 mcg total) into the muscle every 30 (thirty) days.  Patient not taking: No sig reported   McLean-Scocuzza, Nino Glow, MD Not Taking Active   diclofenac Sodium (VOLTAREN) 1 % GEL [id number] Yes Apply 2 g topically 4 (four) times daily. Prn  Patient taking differently: Apply 2 g topically 4 (four) times daily as needed (pain).   McLean-Scocuzza, Nino Glow, MD Taking Active   Fluocinolone Acetonide 0.01 % OIL [id number] Yes Place 5 drops in ear(s) 2 (two) times daily as needed. X1- 2 weeks  Patient taking differently: Place 5 drops in ear(s) as needed.    McLean-Scocuzza, Nino Glow, MD Taking Active   fluticasone [facility]) 50 MCG/ACT nasal spray [id number] No Place 2 sprays into both nostrils daily.  Patient not taking: No sig reported   McLean-Scocuzza, Nino Glow, MD Not Taking Active   insulin NPH-regular Human (NOVOLIN 70/30 RELION) (70-30) 100 UNIT/ML injection [id number] Yes 190 UNITS WITH BREAKFAST AND 30 UNITS WITH SUPPER.  Patient taking differently: 160-180 UNITS WITH BREAKFAST AND 40-70 UNITS WITH SUPPER.   Renato Shin, MD Taking Active   Insulin Syringe-Needle U-100 (RELION INSULIN SYR 1CC/30G) 30G X 5/16" 1 ML MISC [id number]  1 each by Does not apply route 2 (two) times a day. Use to inject insulin BID; E11.9 Renato Shin, MD  Active Self  ketoconazole (NIZORAL) 2 % cream [id number] No Apply 1 application topically 2 (two) times daily.  Safe for everyday use - apply twice a day to areas of rash at the ears. Can use daily for prevention.  Patient not taking: Reported on 06/21/2021   Alfonso Patten, MD Not Taking Active   Lancets [facility] ULTRASOFT) lancets [id number]  Use as instructed 2x daily Dx: 250.00 Renato Shin, MD  Active Self  losartan-hydrochlorothiazide [facility]) 100-12.5 MG tablet [id number] Yes Take 1 tablet by mouth daily. D/c 100-25 mg dose McLean-Scocuzza, Nino Glow, MD Taking Active   metoprolol succinate (TOPROL-XL) 25 MG 24 hr tablet [id number] Yes TAKE 1 TABLET BY MOUTH EVERY DAY McLean-Scocuzza, Nino Glow, MD Taking Active   omeprazole (PRILOSEC) 40 MG capsule [id number] Yes Take 1 capsule (40 mg total) by mouth daily. McLean-Scocuzza, Nino Glow, MD Taking Active   [facility] ULTRA test strip [id number]  TEST TWICE A Marella Chimes, MD  Active Self  Potassium 99 MG TABS [id number] Yes Take by mouth. [provider] Taking Active            Med Note Darnelle Maffucci, Arville Lime   Fri Jun 21, 2021  9:55 AM) Patient thinks it is $Rem'600mg'gMzZ$  which is slightly less than 10 mEq  sodium chloride (OCEAN) 0.65 % SOLN nasal spray [id number] Yes  Place 1 spray into both nostrils as needed for congestion. McLean-Scocuzza, Nino Glow, MD Taking Active Self  tamsulosin (FLOMAX) 0.4 MG CAPS capsule [id number] Yes Take 0.4 mg by mouth daily. [provider] Taking Active Self  triamcinolone ointment (KENALOG) 0.1 % [id number] Yes Apply 1 application topically 2 (two) times daily as needed (Rash). Apply to rash at ears twice a day as needed for up to 2 weeks at a time. Avoid applying to face, groin, and axilla Moye, [place], MD Taking Active   vitamin E 400 UNIT capsule [id number] Yes Take 400 Units by mouth daily. 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   Cholecystitis 11/11/2016   Chest pain 09/26/2016   Diplopia 08/21/2016   Renal failure 08/21/2016   Dyspnea on exertion 07/09/2016   Diabetes ([place]) 03/29/2016   Hematuria 08/07/2014   Routine general medical examination at a health care facility 09/08/2013   Hearing loss of aging 09/07/2013   Screening for prostate cancer 05/04/2013   Encounter for long-term (current) use of other medications 05/04/2013   Contusion of ankle or foot, right 05/07/2012   KNEE PAIN, RIGHT 12/20/2010   PROTEINURIA, MILD 05/14/2010   HIP PAIN, LEFT 11/13/2009   ESOPHAGEAL STRICTURE 12/28/2008   GERD 11/28/2008   Chronic constipation 11/28/2008   OTHER DYSPHAGIA 11/28/2008   HEMOCHROMATOSIS 11/28/2008   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[place]) 09/29/2008   URINARY CALCULUS 09/29/2008   OSTEOARTHRITIS,  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   DYSPHAGIA UNSPECIFIED 09/29/2008   Hypertension associated with diabetes ([place]) 01/07/2008   PAIN IN SOFT TISSUES OF LIMB 01/07/2008   UNSPECIFIED ANEMIA 12/29/2007   Dyslipidemia 08/03/2007   Allergic rhinitis 08/03/2007   BPH (benign prostatic hyperplasia) 08/03/2007   COLONIC POLYPS, HX OF  08/03/2007    Immunization History  Administered Date(s) Administered   Fluad Quad(high Dose 65+) 07/27/2019, 08/01/2020   Influenza Whole 08/24/2009, 08/25/2011   Influenza, High Dose Seasonal PF 08/21/2016, 08/25/2017, 08/31/2018   Influenza,inj,Quad PF,6+ Mos 10/29/2015   Influenza-Unspecified 10/29/2015   Moderna SARS-COV2 Booster Vaccination 11/24/2020, 03/19/2021   Moderna Sars-Covid-2 Vaccination 01/05/2020, 02/02/2020   Pneumococcal Conjugate-13 01/23/2017   Pneumococcal Polysaccharide-23 09/25/2003, 03/27/2015   Td 09/25/2003   Tdap 08/31/2018    Conditions to be addressed/monitored: HTN, HLD, and DMII  Care Plan : Medication Management  Updates made by De Hollingshead, RPH-CPP since 06/21/2021 12:00 AM     Problem: Diabetes, Hypertension, Hyperlipidemia      Long-Range Goal: Disease Progression Prevention   Start Date: 05/08/2021  This Visit's Progress: On track  Recent Progress: On track  Priority: High  Note:   Current Barriers:  Unable to achieve control of diabetes  Suboptimal therapeutic regimen for diabetes  Pharmacist Clinical Goal(s):  Over the next 90 days, patient will achieve control of diabetes as evidenced by A1c  through collaboration with PharmD and provider.    Interventions: 1:1 collaboration with McLean-Scocuzza, Nino Glow, MD regarding development and update of comprehensive plan of care as evidenced by provider attestation and co-signature Inter-disciplinary care team collaboration (see longitudinal plan of care) Comprehensive medication review performed; medication list updated in electronic medical  record  Diabetes: Uncontrolled and suboptimally managed; current treatment: Novolin OTC 70/30 140-170 units QAM, 40-70 units QPM - though reports he often adjusts his QPM regimen based upon anticipated meals, often does not adjust appropriate and will have high readings later in the evening; followed by endocrinology Dr. Loanne Drilling Hx metformin: stopped by Dr. Loanne Drilling in 2014, documented reason to simplify regimen Hx pioglitazone: stopped by Dr. Loanne Drilling in 2014, documented reason to simplify regimen Hx glimepiride: stopped by Dr. Loanne Drilling in 2014, documented reason to simplify regimen Had been wearing Libre 2 sensor, but one stopped working after a day and another fell off. Walked through placement today. Provided samples and Skin Grip patch to cover. Advised to let me know if we could help provide education for his wife to learn to place it.  Also discussed benefit of non-insulin agents. Patient asked about medications to help with weight loss. Discussed GLP1. Encouraged to consider.   Hypertension: Controlled but fluctuant; current treatment: amlodipine 2.5 mg QPM, metoprolol succinate 25 mg QPM, losartan/HCTZ 100/12.5 mg QAM Previously recommended to continue current regimen.   Hyperlipidemia and ASCVD risk reduction: Controlled; current treatment: atorvastatin 20 mg daily;  Antiplatelet regimen: aspirin 81 mg daily  Previously recommended to continue current regimen at this time  GERD: Controlled per patient report; current regimen: omeprazole 20 mg daily Previously recommended to continue current regimen at this time  BPH: Controlled per patient report; current regimen: tamsulosin 0.4 mg daily Previously recommended to continue current regimen at this time  Health Maintenance: Due for shingrix vaccination. Will discuss moving forward   Patient Goals/Self-Care Activities Over the next 90 days, patient will:  - take medications as prescribed check glucose three times daily using CGM,  document, and provide at future appointments check blood pressure daily, document, and provide at future appointments collaborate with provider on medication access solutions  Follow Up Plan: Face to Face appointment with care management team member scheduled for:  ~ 3 weeks      Medication Assistance: None required.  Patient affirms current coverage meets needs.  Patient's preferred pharmacy is:  [facility] [phone] -  [place], [place] [address] [place] [address] [address] Phone: [phone] Fax: [place], [place] [address] [place] [address] Phone: [phone] Fax: [phone]  [id number] - [place], [place] [address] [address] Phone: [phone] Fax: [phone]  [facility] [place]) [place] [address] Phone: [phone] Fax: [phone]   Follow Up:  Patient agrees to Care Plan and Follow-up.  Plan: Face to Face appointment with care management team member scheduled for: 3 weeks  Catie Darnelle Maffucci, PharmD, [place], [place] Clinical Pharmacist [employer] [facility] [employer] [phone]

## 2021-06-30 ENCOUNTER — Encounter: Payer: Self-pay | Admitting: Dermatology

## 2021-07-05 ENCOUNTER — Telehealth: Payer: Self-pay

## 2021-07-05 ENCOUNTER — Ambulatory Visit: Payer: PPO

## 2021-07-05 NOTE — Progress Notes (Signed)
I rescheduled his appt for 8/25 @ 915 (pt requested this time)

## 2021-07-05 NOTE — Telephone Encounter (Signed)
Pt called and needed to cancel his appt with you for today. He states that he has an upset stomach and not feeling well. FYI

## 2021-07-18 ENCOUNTER — Ambulatory Visit (INDEPENDENT_AMBULATORY_CARE_PROVIDER_SITE_OTHER): Payer: PPO | Admitting: Pharmacist

## 2021-07-18 ENCOUNTER — Other Ambulatory Visit: Payer: Self-pay

## 2021-07-18 DIAGNOSIS — Z794 Long term (current) use of insulin: Secondary | ICD-10-CM | POA: Diagnosis not present

## 2021-07-18 DIAGNOSIS — E785 Hyperlipidemia, unspecified: Secondary | ICD-10-CM | POA: Diagnosis not present

## 2021-07-18 DIAGNOSIS — E1165 Type 2 diabetes mellitus with hyperglycemia: Secondary | ICD-10-CM

## 2021-07-18 DIAGNOSIS — I152 Hypertension secondary to endocrine disorders: Secondary | ICD-10-CM | POA: Diagnosis not present

## 2021-07-18 DIAGNOSIS — N400 Enlarged prostate without lower urinary tract symptoms: Secondary | ICD-10-CM | POA: Diagnosis not present

## 2021-07-18 DIAGNOSIS — E1159 Type 2 diabetes mellitus with other circulatory complications: Secondary | ICD-10-CM

## 2021-07-18 MED ORDER — OZEMPIC (0.25 OR 0.5 MG/DOSE) 2 MG/1.5ML ~~LOC~~ SOPN: 0.5000 mg | PEN_INJECTOR | SUBCUTANEOUS | 1 refills | Status: DC

## 2021-07-18 MED ORDER — OZEMPIC (0.25 OR 0.5 MG/DOSE) 2 MG/1.5ML ~~LOC~~ SOPN: PEN_INJECTOR | SUBCUTANEOUS | 0 refills | Status: AC

## 2021-07-18 NOTE — Chronic Care Management (AMB) (Signed)
Chronic Care Management Pharmacy Note  07/18/2021 Name:  [name] MRN:  [id number] DOB:  1946-11-03  Subjective: [name] is an 75 y.o. year old male who is a primary patient of McLean-Scocuzza, Nino Glow, MD.  The CCM team was consulted for assistance with disease management and care coordination needs.    Engaged with patient face to face for follow up visit in response to provider referral for pharmacy case management and/or care coordination services.   Consent to Services:  The patient was given information about Chronic Care Management services, agreed to services, and gave verbal consent prior to initiation of services.  Please see initial visit note for detailed documentation.   Patient Care Team: McLean-Scocuzza, Nino Glow, MD as PCP - General (Internal Medicine) De Hollingshead, RPH-CPP (Pharmacist)    Objective:  Lab Results  Component Value Date   CREATININE 1.40 (H) 03/15/2021   CREATININE 1.34 09/25/2020   CREATININE 1.44 05/29/2020    Lab Results  Component Value Date   HGBA1C 7.2 (A) 04/11/2021   Last diabetic Eye exam:  Lab Results  Component Value Date/Time   HMDIABEYEEXA Retinopathy (A) 04/17/2021 12:00 AM    Last diabetic Foot exam: No results found for: HMDIABFOOTEX      Component Value Date/Time   CHOL 80 05/29/2020 0919   TRIG 63.0 05/29/2020 0919   HDL 38.30 (L) 05/29/2020 0919   CHOLHDL 2 05/29/2020 0919   VLDL 12.6 05/29/2020 0919   LDLCALC 29 05/29/2020 0919   LDLDIRECT 46.0 08/31/2018 1106    Hepatic Function Latest Ref Rng & Units 09/25/2020 05/29/2020 06/01/2019  Total Protein 6.0 - 8.3 g/dL 7.2 7.2 7.3  Albumin 3.5 - 5.2 g/dL 4.2 4.4 4.3  AST 0 - 37 U/L $Remo'19 21 18  'VeEEW$ ALT 0 - 53 U/L $Remo'19 29 19  'VeatW$ Alk Phosphatase 39 - 117 U/L 57 55 50  Total Bilirubin 0.2 - 1.2 mg/dL 0.7 1.0 0.8  Bilirubin, Direct 0.0 - 0.3 mg/dL - - -    Lab Results  Component Value Date/Time   TSH 1.89 05/29/2020 09:19 AM   TSH 0.96 12/02/2018 08:03 AM     CBC Latest Ref Rng & Units 03/15/2021 05/29/2020 06/01/2019  WBC 4.0 - 10.5 K/uL 7.0 7.4 6.3  Hemoglobin 13.0 - 17.0 g/dL 17.2(H) 16.5 17.3(H)  Hematocrit 39.0 - 52.0 % 47.2 47.4 49.8  Platelets 150 - 400 K/uL 186 180.0 168.0    Lab Results  Component Value Date/Time   VD25OH 60.03 05/29/2020 09:19 AM   VD25OH 51.09 06/01/2019 08:10 AM    Clinical ASCVD: Yes  The ASCVD Risk score Mikey Bussing DC Jr., et al., 2013) failed to calculate for the following reasons:   The valid total cholesterol range is 130 to 320 mg/dL      Social History   Tobacco Use  Smoking Status Never  Smokeless Tobacco Never   BP Readings from Last 3 Encounters:  04/11/21 (!) 144/68  03/29/21 124/60  11/28/20 130/64   Pulse Readings from Last 3 Encounters:  04/11/21 78  03/29/21 84  11/28/20 80   Wt Readings from Last 3 Encounters:  04/11/21 226 lb 9.6 oz (102.8 kg)  03/29/21 225 lb (102.1 kg)  01/23/21 220 lb (99.8 kg)    Assessment: Review of patient past medical history, allergies, medications, health status, including review of consultants reports, laboratory and other test data, was performed as part of comprehensive evaluation and provision of chronic care management services.   SDOH:  (  Social Determinants of Health) assessments and interventions performed:  SDOH Interventions    Flowsheet Row Most Recent Value  SDOH Interventions   Financial Strain Interventions Intervention Not Indicated       CCM Care Plan  Allergies  Allergen Reactions   Sulfamethoxazole-Trimethoprim Rash    Medications Reviewed Today     Reviewed by Alfonso Patten, MD (Physician) on 06/30/21 at 2256  Med List Status: <None>   Medication Order Taking? Sig Documenting Provider Last Dose Status Informant  albuterol (VENTOLIN HFA) 108 (90 Base) MCG/ACT inhaler [id number] Yes Inhale 1-2 puffs into the lungs every 6 (six) hours as needed for wheezing or shortness of breath.  Patient not taking: Reported on 06/21/2021    McLean-Scocuzza, Nino Glow, MD Taking Active   amLODipine (NORVASC) 2.5 MG tablet [id number] Yes Take 1 tablet (2.5 mg total) by mouth daily. McLean-Scocuzza, Nino Glow, MD Taking Active   Ascorbic Acid (VITAMIN C) 1000 MG tablet [id number] Yes Take 1,000 mg by mouth daily. [provider] Taking Active   aspirin 81 MG tablet [id number] Yes Take 81 mg by mouth daily. [provider] Taking Active Self  atorvastatin (LIPITOR) 20 MG tablet [id number] Yes Take 1 tablet (20 mg total) by mouth daily at 6 PM. McLean-Scocuzza, Nino Glow, MD Taking Active   calcium carbonate (OSCAL) 1500 (600 Ca) MG TABS tablet [id number] Yes Take 600 mg of elemental calcium by mouth daily with breakfast. [provider] Taking Active Self  Continuous Blood Gluc Receiver (FREESTYLE LIBRE 2 READER) [place] [id number] Yes Use to check glucose at least TID  Patient not taking: Reported on 06/21/2021   McLean-Scocuzza, Nino Glow, MD Taking Active   Continuous Blood Gluc Sensor (FREESTYLE LIBRE 2 SENSOR) [place] [id number] Yes Use to check glucose at least three times daily  Patient not taking: Reported on 06/21/2021   McLean-Scocuzza, Nino Glow, MD Taking Active   cyanocobalamin ((VITAMIN B-12)) injection 1,000 mcg [id number]   McLean-Scocuzza, Nino Glow, MD  Active   cyanocobalamin (,VITAMIN B-12,) 1000 MCG/ML injection [id number] No Inject 1 mL (1,000 mcg total) into the muscle every 30 (thirty) days.  Patient not taking: No sig reported   McLean-Scocuzza, Nino Glow, MD Not Taking Active   diclofenac Sodium (VOLTAREN) 1 % GEL [id number] Yes Apply 2 g topically 4 (four) times daily. Prn  Patient taking differently: Apply 2 g topically 4 (four) times daily as needed (pain).   McLean-Scocuzza, Nino Glow, MD Taking Active   Fluocinolone Acetonide 0.01 % OIL [id number] Yes Place 5 drops in ear(s) 2 (two) times daily as needed. X1- 2 weeks  Patient taking differently: Place 5 drops in ear(s) as needed.   McLean-Scocuzza, Nino Glow, MD  Taking Active   fluticasone [facility]) 50 MCG/ACT nasal spray [id number] No Place 2 sprays into both nostrils daily.  Patient not taking: No sig reported   McLean-Scocuzza, Nino Glow, MD Not Taking Active   insulin NPH-regular Human (NOVOLIN 70/30 RELION) (70-30) 100 UNIT/ML injection [id number] Yes 190 UNITS WITH BREAKFAST AND 30 UNITS WITH SUPPER.  Patient taking differently: 160-180 UNITS WITH BREAKFAST AND 40-70 UNITS WITH SUPPER.   Renato Shin, MD Taking Active   Insulin Syringe-Needle U-100 (RELION INSULIN SYR 1CC/30G) 30G X 5/16" 1 ML MISC [id number] Yes 1 each by Does not apply route 2 (two) times a day. Use to inject insulin BID; E11.9 Renato Shin, MD Taking Active Self  ketoconazole (NIZORAL) 2 % cream [id number] Yes Apply 1 application topically 2 (two) times daily. Safe for everyday  use - apply twice a day to areas of rash at the ears. Can use daily for prevention.  Patient not taking: Reported on 06/21/2021   Alfonso Patten, MD  Active   Lancets [facility] ULTRASOFT) lancets [id number] Yes Use as instructed 2x daily Dx: 250.00 Renato Shin, MD Taking Active Self  losartan-hydrochlorothiazide [facility]) 100-12.5 MG tablet [id number] Yes Take 1 tablet by mouth daily. D/c 100-25 mg dose McLean-Scocuzza, Nino Glow, MD Taking Active   metoprolol succinate (TOPROL-XL) 25 MG 24 hr tablet [id number] Yes TAKE 1 TABLET BY MOUTH EVERY DAY McLean-Scocuzza, Nino Glow, MD Taking Active   omeprazole (PRILOSEC) 40 MG capsule [id number] Yes Take 1 capsule (40 mg total) by mouth daily. McLean-Scocuzza, Nino Glow, MD Taking Active   [facility] ULTRA test strip [id number] Yes TEST TWICE A Marella Chimes, MD Taking Active Self  Potassium 99 MG TABS [id number] Yes Take by mouth. [provider] Taking Active            Med Note Darnelle Maffucci, Arville Lime   Fri Jun 21, 2021  9:55 AM) Patient thinks it is '600mg'$  which is slightly less than 10 mEq  sodium chloride (OCEAN) 0.65 % SOLN nasal spray [id number] Yes Place 1 spray  into both nostrils as needed for congestion. McLean-Scocuzza, Nino Glow, MD Taking Active Self  tamsulosin (FLOMAX) 0.4 MG CAPS capsule [id number] Yes Take 0.4 mg by mouth daily. [provider] Taking Active Self  triamcinolone ointment (KENALOG) 0.1 % [id number] Yes Apply 1 application topically 2 (two) times daily as needed (Rash). Apply to rash at ears twice a day as needed for up to 2 weeks at a time. Avoid applying to face, groin, and axilla Moye, [place], MD  Active   vitamin E 400 UNIT capsule [id number] Yes Take 400 Units by mouth daily. 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   Cholecystitis 11/11/2016   Chest pain 09/26/2016   Diplopia 08/21/2016   Renal failure 08/21/2016   Dyspnea on exertion 07/09/2016   Diabetes ([place]) 03/29/2016   Hematuria 08/07/2014   Routine general medical examination at a health care facility 09/08/2013   Hearing loss of aging 09/07/2013   Screening for prostate cancer 05/04/2013   Encounter for long-term (current) use of other medications 05/04/2013   Contusion of ankle or foot, right 05/07/2012   KNEE PAIN, RIGHT 12/20/2010   PROTEINURIA, MILD 05/14/2010   HIP PAIN, LEFT 11/13/2009   ESOPHAGEAL STRICTURE 12/28/2008   GERD 11/28/2008   Chronic constipation 11/28/2008   OTHER DYSPHAGIA 11/28/2008   HEMOCHROMATOSIS 11/28/2008   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[place]) 09/29/2008   URINARY CALCULUS 09/29/2008   OSTEOARTHRITIS, 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   DYSPHAGIA UNSPECIFIED 09/29/2008   Hypertension associated with diabetes ([place]) 01/07/2008   PAIN IN SOFT TISSUES OF LIMB 01/07/2008   UNSPECIFIED ANEMIA 12/29/2007   Dyslipidemia 08/03/2007   Allergic rhinitis 08/03/2007   BPH (benign prostatic hyperplasia) 08/03/2007   COLONIC POLYPS, HX OF 08/03/2007  Immunization History  Administered Date(s) Administered   Fluad Quad(high Dose 65+) 07/27/2019, 08/01/2020   Influenza Whole 08/24/2009, 08/25/2011   Influenza, High Dose Seasonal PF 08/21/2016, 08/25/2017, 08/31/2018   Influenza,inj,Quad PF,6+ Mos 10/29/2015   Influenza-Unspecified 10/29/2015   Moderna SARS-COV2 Booster Vaccination 11/24/2020, 03/19/2021   Moderna Sars-Covid-2 Vaccination 01/05/2020, 02/02/2020   Pneumococcal Conjugate-13 01/23/2017   Pneumococcal Polysaccharide-23 09/25/2003, 03/27/2015   Td 09/25/2003   Tdap 08/31/2018    Conditions to be addressed/monitored: HTN, HLD, and DMII  Care Plan : Medication Management  Updates made by De Hollingshead, RPH-CPP since 07/18/2021 12:00 AM     Problem: Diabetes, Hypertension, Hyperlipidemia      Long-Range Goal: Disease Progression Prevention   Start Date: 05/08/2021  This Visit's Progress: On track  Recent Progress: On track  Priority: High  Note:   Current Barriers:  Unable to achieve control of diabetes  Suboptimal therapeutic regimen for diabetes  Pharmacist Clinical Goal(s):  Over the next 90 days, patient will achieve control of diabetes as evidenced by A1c  through collaboration with PharmD and provider.   Interventions: 1:1 collaboration with McLean-Scocuzza, Nino Glow, MD regarding development and update of comprehensive plan of care as evidenced by provider attestation and co-signature Inter-disciplinary care team collaboration (see longitudinal plan of care) Comprehensive medication review performed; medication list updated in electronic medical record  Diabetes: Uncontrolled  and suboptimally managed; current treatment: Novolin OTC 70/30 140-170 units QAM, 40-70 units QPM - though reports he often adjusts his QPM regimen based upon anticipated meals, often does not adjust appropriate and will have high readings later in the evening; followed by endocrinology Dr. Loanne Drilling, but patient reports that he would like to transfer DM care back to his PCP due to desire to not want to drive to [facility] Hx metformin: stopped by Dr. Loanne Drilling in 2014, documented reason to simplify regimen Hx pioglitazone: stopped by Dr. Loanne Drilling in 2014, documented reason to simplify regimen Hx glimepiride: stopped by Dr. Loanne Drilling in 2014, documented reason to simplify regimen Has been wearing Libre 2 CGM; current readings: fastings: >200s; some lows as well. Did not bring reader today Educated on benefit of GLP1 w/ glycemic control, weight management, CV protection. Discussed copay w/ HTA. Discussed patient assistance - patient over income now while his wife is still working, but when she retires in 4-5 years, may meet income.  Counseled on GLP1 agonists, including mechanism of action, side effects, and benefits. No personal or family history of medullary thyroid cancer, personal history of pancreatitis, and does have a hx gallbladder disease but s/p cholecystectomy. Counseled on potential side effects of nausea, stomach upset, queasiness, constipation, and that these generally improve over time. Advised to contact our office with more severe symptoms, including nausea, diarrhea, stomach pain. Patient verbalized understanding.  Start Ozempic 0.25 mg weekly x 4 weeks then increase to 0.5 mg weekly. Sample provided for starter supply, script for continuing 0.5 mg dose sent to pharmacy.  Advised to proactively reduce insulin by 20% to 130 units QAM, 50 units QPM to reduce risk of hypoglycemia w/ initiation of GLP1.  Discussed Ozempic shortage. Advised to contact me if issues filling medication when he is due for  refill.  Hypertension: Controlled but fluctuant; current treatment: amlodipine 2.5 mg QPM, metoprolol succinate 25 mg QPM, losartan/HCTZ 100/12.5 mg QAM Previously recommended to continue current regimen. Will address control and home monitoring moving forward.   Hyperlipidemia and ASCVD risk reduction: Controlled per last lipid panel; current treatment: atorvastatin 20 mg daily;  Antiplatelet regimen: aspirin 81 mg daily  Previously recommended to continue current regimen at this time  GERD: Controlled per patient report; current regimen: omeprazole 20 mg daily Previously recommended to continue current regimen at this time  BPH: Controlled per patient report; current regimen: tamsulosin 0.4 mg daily Previously recommended to continue current regimen at this time  Health Maintenance: Due for shingrix vaccination. Will discuss moving forward   Patient Goals/Self-Care Activities Over the next 90 days, patient will:  - take medications as prescribed check glucose three times daily using CGM, document, and provide at future appointments check blood pressure daily, document, and provide at future appointments collaborate with provider on medication access solutions  Follow Up Plan: Face to Face appointment with care management team member scheduled for:  ~ 8 weeks      Medication Assistance: None required.  Patient affirms current coverage meets needs.  Patient's preferred pharmacy is:  [facility] [address] [place] [address] Phone: [phone] Fax: [place], [place] [address] [place] [address] Phone: [phone] Fax: [phone]  [id number] [place], [address] [place] [address] Phone: [phone] Fax: [phone]  [place] [address] Phone: [phone] Fax: [phone]   Follow Up:  Patient agrees to Care Plan and Follow-up.  Plan: Face to Face appointment with care management team member scheduled for: ~ 8 weeks  Catie Darnelle Maffucci, PharmD, [place], CPP Clinical Pharmacist [place] at [facility] [phone]  Medication Samples have been provided to the patient.  Drug name: Ozempic       Strength: 0.25/0.5        Qty: 1 pen  LOT: [id number]  Exp.Date: 10/24/23  Dosing instructions: Inject 0.25 mg weekly for 4 weeks then increase to 0.5 mg weekly  The patient has been instructed regarding the correct time, dose, and frequency of taking this medication, including desired effects and most common side effects.   De Hollingshead 9:50 AM 07/18/2021

## 2021-07-18 NOTE — Patient Instructions (Addendum)
[name],   It was great talking with you today! Start Ozempic 0.25 mg weekly for 4 weeks, then increase to 0.5 mg weekly. This medication may cause stomach upset, queasiness, or constipation, especially when first starting. This generally improves over time. Call our office if these symptoms occur and worsen, or if you have severe symptoms such as vomiting, diarrhea, or stomach pain.   To reduce your risk of low blood sugars, I recommend you reduce your insulin to 130 units in the morning and 50 units in the evening.   Bring your reader to our next appointment.   Take care!  [name], PharmD [phone]  Visit Information  PATIENT GOALS:  Goals Addressed               This Visit's Progress     Patient Stated     Medication Monitoring (pt-stated)        Patient Goals/Self-Care Activities Over the next 90 days, patient will:  - take medications as prescribed check glucose three times daily using CGM, document, and provide at future appointments check blood pressure daily, document, and provide at future appointments collaborate with provider on medication access solutions        Print copy of patient instructions, educational materials, and care plan provided in person.  Plan: Face to Face appointment with care management team member scheduled for: ~ 8 weeks  [name], PharmD, [place], [place] Clinical Pharmacist [employer] at [employer] [phone]

## 2021-08-09 ENCOUNTER — Telehealth: Payer: Self-pay | Admitting: Endocrinology

## 2021-08-09 NOTE — Telephone Encounter (Signed)
Patient called to let Dr. Loanne Drilling know that he will no longer be receiving treatment at [facility]. Patient will be getting treatment from his PCP from now on (closer to home).

## 2021-08-14 ENCOUNTER — Ambulatory Visit: Payer: PPO | Admitting: Endocrinology

## 2021-08-14 ENCOUNTER — Encounter: Payer: Self-pay | Admitting: Internal Medicine

## 2021-08-14 ENCOUNTER — Telehealth: Payer: Self-pay | Admitting: Internal Medicine

## 2021-08-14 NOTE — Telephone Encounter (Signed)
Patient informed, Due to the high volume of calls and your symptoms we have to forward your call to our Triage Nurse to expedient your call. Please hold for the transfer.  Patient transferred to Access Nurse. Due to his head being stopped up and he is requesting a prescription for prednisone be sent in for his symptom.No openings in office or virtual.

## 2021-08-14 NOTE — Telephone Encounter (Signed)
Called to triage further patient no answer.

## 2021-08-14 NOTE — Telephone Encounter (Signed)
Left message to call office

## 2021-08-14 NOTE — Telephone Encounter (Signed)
Patient has been informed he stated he will do [facility] virtual through mychart.

## 2021-08-14 NOTE — Telephone Encounter (Signed)
Spoken to patient he stated he would like an rx for his sx. He has been having head congestion for a couple months. He was seen previously for sx in the past but hasn't seen any relief. Last seen [place] earlier this year per patient for the same sx. SX are slight runny nose, nasal congestion, dry cough from COPD. No sx of headache, jaw px, arm px, chest px, SOB, fever, chills, nausea, vomiting, diarrhea, blurry vision, and heart palpitations. Patient has taken prednisone previously to help with sx. Patient tried Claritin for sx no other medications. [facility] [place]. Please advise.

## 2021-08-23 DIAGNOSIS — J301 Allergic rhinitis due to pollen: Secondary | ICD-10-CM | POA: Diagnosis not present

## 2021-09-05 ENCOUNTER — Other Ambulatory Visit: Payer: Self-pay | Admitting: Endocrinology

## 2021-09-05 ENCOUNTER — Ambulatory Visit (INDEPENDENT_AMBULATORY_CARE_PROVIDER_SITE_OTHER): Payer: PPO | Admitting: Pharmacist

## 2021-09-05 ENCOUNTER — Ambulatory Visit: Payer: PPO

## 2021-09-05 ENCOUNTER — Telehealth: Payer: Self-pay | Admitting: Pharmacist

## 2021-09-05 DIAGNOSIS — E11319 Type 2 diabetes mellitus with unspecified diabetic retinopathy without macular edema: Secondary | ICD-10-CM

## 2021-09-05 DIAGNOSIS — I152 Hypertension secondary to endocrine disorders: Secondary | ICD-10-CM

## 2021-09-05 DIAGNOSIS — E1165 Type 2 diabetes mellitus with hyperglycemia: Secondary | ICD-10-CM

## 2021-09-05 DIAGNOSIS — E1159 Type 2 diabetes mellitus with other circulatory complications: Secondary | ICD-10-CM

## 2021-09-05 DIAGNOSIS — E785 Hyperlipidemia, unspecified: Secondary | ICD-10-CM

## 2021-09-05 MED ORDER — NOVOLIN 70/30 RELION (70-30) 100 UNIT/ML ~~LOC~~ SUSP: SUBCUTANEOUS | 2 refills | Status: DC

## 2021-09-05 NOTE — Patient Instructions (Signed)
Visit Information  PATIENT GOALS:  Goals Addressed               This Visit's Progress     Patient Stated     Medication Monitoring (pt-stated)        Patient Goals/Self-Care Activities Over the next 90 days, patient will:  - take medications as prescribed check glucose three times daily using CGM, document, and provide at future appointments check blood pressure daily, document, and provide at future appointments collaborate with provider on medication access solutions         Patient verbalizes understanding of instructions provided today and agrees to view in [place].   Plan: Face to Face appointment with care management team member scheduled for: 8 weeks  Catie Darnelle Maffucci, PharmD, [place], [place] Clinical Pharmacist [employer] at [employer] [phone]

## 2021-09-05 NOTE — Telephone Encounter (Signed)
Patient calling and needs his insulin refilled. States he spoke to Catie about her and Dr Olivia Mackie McLean-Scocuzza taking over his sugar medications with him no longer having to see endocrinology.   Please advise

## 2021-09-05 NOTE — Chronic Care Management (AMB) (Signed)
Chronic Care Management Pharmacy Note  09/05/2021 Name:  [name] MRN:  [id number] DOB:  08-22-1946   Subjective: [name] is an 75 y.o. year old male who is a primary patient of McLean-Scocuzza, Nino Glow, MD.  Collaborating with covering provider while PCP is on leave The CCM team was consulted for assistance with disease management and care coordination needs.    Engaged with patient by telephone for  medication access  in response to provider referral for pharmacy case management and/or care coordination services.   Consent to Services:  The patient was given information about Chronic Care Management services, agreed to services, and gave verbal consent prior to initiation of services.  Please see initial visit note for detailed documentation.   Patient Care Team: McLean-Scocuzza, Nino Glow, MD as PCP - General (Internal Medicine) De Hollingshead, RPH-CPP (Pharmacist)   Objective:  Lab Results  Component Value Date   CREATININE 1.40 (H) 03/15/2021   CREATININE 1.34 09/25/2020   CREATININE 1.44 05/29/2020    Lab Results  Component Value Date   HGBA1C 7.2 (A) 04/11/2021   Last diabetic Eye exam:  Lab Results  Component Value Date/Time   HMDIABEYEEXA Retinopathy (A) 04/17/2021 12:00 AM    Last diabetic Foot exam: No results found for: HMDIABFOOTEX      Component Value Date/Time   CHOL 80 05/29/2020 0919   TRIG 63.0 05/29/2020 0919   HDL 38.30 (L) 05/29/2020 0919   CHOLHDL 2 05/29/2020 0919   VLDL 12.6 05/29/2020 0919   LDLCALC 29 05/29/2020 0919   LDLDIRECT 46.0 08/31/2018 1106    Hepatic Function Latest Ref Rng & Units 09/25/2020 05/29/2020 06/01/2019  Total Protein 6.0 - 8.3 g/dL 7.2 7.2 7.3  Albumin 3.5 - 5.2 g/dL 4.2 4.4 4.3  AST 0 - 37 U/L $Remo'19 21 18  'baOMm$ ALT 0 - 53 U/L $Remo'19 29 19  'gsEuE$ Alk Phosphatase 39 - 117 U/L 57 55 50  Total Bilirubin 0.2 - 1.2 mg/dL 0.7 1.0 0.8  Bilirubin, Direct 0.0 - 0.3 mg/dL - - -    Lab Results  Component Value Date/Time    TSH 1.89 05/29/2020 09:19 AM   TSH 0.96 12/02/2018 08:03 AM    CBC Latest Ref Rng & Units 03/15/2021 05/29/2020 06/01/2019  WBC 4.0 - 10.5 K/uL 7.0 7.4 6.3  Hemoglobin 13.0 - 17.0 g/dL 17.2(H) 16.5 17.3(H)  Hematocrit 39.0 - 52.0 % 47.2 47.4 49.8  Platelets 150 - 400 K/uL 186 180.0 168.0    Lab Results  Component Value Date/Time   VD25OH 60.03 05/29/2020 09:19 AM   VD25OH 51.09 06/01/2019 08:10 AM   Social History   Tobacco Use  Smoking Status Never  Smokeless Tobacco Never   BP Readings from Last 3 Encounters:  04/11/21 (!) 144/68  03/29/21 124/60  11/28/20 130/64   Pulse Readings from Last 3 Encounters:  04/11/21 78  03/29/21 84  11/28/20 80   Wt Readings from Last 3 Encounters:  04/11/21 226 lb 9.6 oz (102.8 kg)  03/29/21 225 lb (102.1 kg)  01/23/21 220 lb (99.8 kg)    Assessment: Review of patient past medical history, allergies, medications, health status, including review of consultants reports, laboratory and other test data, was performed as part of comprehensive evaluation and provision of chronic care management services.   SDOH:  (Social Determinants of Health) assessments and interventions performed:  SDOH Interventions    Flowsheet Row Most Recent Value  SDOH Interventions   Financial Strain Interventions Other (Comment)  CCM Care Plan  Allergies  Allergen Reactions   Sulfamethoxazole-Trimethoprim Rash    Medications Reviewed Today     Reviewed by Alfonso Patten, MD (Physician) on 06/30/21 at 2256  Med List Status: <None>   Medication Order Taking? Sig Documenting Provider Last Dose Status Informant  albuterol (VENTOLIN HFA) 108 (90 Base) MCG/ACT inhaler [id number] Yes Inhale 1-2 puffs into the lungs every 6 (six) hours as needed for wheezing or shortness of breath.  Patient not taking: Reported on 06/21/2021   McLean-Scocuzza, Nino Glow, MD Taking Active   amLODipine (NORVASC) 2.5 MG tablet [id number] Yes Take 1 tablet (2.5 mg total) by mouth  daily. McLean-Scocuzza, Nino Glow, MD Taking Active   Ascorbic Acid (VITAMIN C) 1000 MG tablet [id number] Yes Take 1,000 mg by mouth daily. [provider] Taking Active   aspirin 81 MG tablet [id number] Yes Take 81 mg by mouth daily. [provider] Taking Active Self  atorvastatin (LIPITOR) 20 MG tablet [id number] Yes Take 1 tablet (20 mg total) by mouth daily at 6 PM. McLean-Scocuzza, Nino Glow, MD Taking Active   calcium carbonate (OSCAL) 1500 (600 Ca) MG TABS tablet [id number] Yes Take 600 mg of elemental calcium by mouth daily with breakfast. [provider] Taking Active Self  Continuous Blood Gluc Receiver (FREESTYLE LIBRE 2 READER) [place] [id number] Yes Use to check glucose at least TID  Patient not taking: Reported on 06/21/2021   McLean-Scocuzza, Nino Glow, MD Taking Active   Continuous Blood Gluc Sensor (FREESTYLE LIBRE 2 SENSOR) [place] [id number] Yes Use to check glucose at least three times daily  Patient not taking: Reported on 06/21/2021   McLean-Scocuzza, Nino Glow, MD Taking Active   cyanocobalamin ((VITAMIN B-12)) injection 1,000 mcg [id number]   McLean-Scocuzza, Nino Glow, MD  Active   cyanocobalamin (,VITAMIN B-12,) 1000 MCG/ML injection [id number] No Inject 1 mL (1,000 mcg total) into the muscle every 30 (thirty) days.  Patient not taking: No sig reported   McLean-Scocuzza, Nino Glow, MD Not Taking Active   diclofenac Sodium (VOLTAREN) 1 % GEL [id number] Yes Apply 2 g topically 4 (four) times daily. Prn  Patient taking differently: Apply 2 g topically 4 (four) times daily as needed (pain).   McLean-Scocuzza, Nino Glow, MD Taking Active   Fluocinolone Acetonide 0.01 % OIL [id number] Yes Place 5 drops in ear(s) 2 (two) times daily as needed. X1- 2 weeks  Patient taking differently: Place 5 drops in ear(s) as needed.   McLean-Scocuzza, Nino Glow, MD Taking Active   fluticasone [facility]) 50 MCG/ACT nasal spray [id number] No Place 2 sprays into both nostrils daily.  Patient not  taking: No sig reported   McLean-Scocuzza, Nino Glow, MD Not Taking Active   insulin NPH-regular Human (NOVOLIN 70/30 RELION) (70-30) 100 UNIT/ML injection [id number] Yes 190 UNITS WITH BREAKFAST AND 30 UNITS WITH SUPPER.  Patient taking differently: 160-180 UNITS WITH BREAKFAST AND 40-70 UNITS WITH SUPPER.   Renato Shin, MD Taking Active   Insulin Syringe-Needle U-100 (RELION INSULIN SYR 1CC/30G) 30G X 5/16" 1 ML MISC [id number] Yes 1 each by Does not apply route 2 (two) times a day. Use to inject insulin BID; E11.9 Renato Shin, MD Taking Active Self  ketoconazole (NIZORAL) 2 % cream [id number] Yes Apply 1 application topically 2 (two) times daily. Safe for everyday use - apply twice a day to areas of rash at the ears. Can use daily for prevention.  Patient not taking: Reported on 06/21/2021   Alfonso Patten, MD  Active   Lancets [facility]  ULTRASOFT) lancets [id number] Yes Use as instructed 2x daily Dx: 250.00 Renato Shin, MD Taking Active Self  losartan-hydrochlorothiazide [facility]) 100-12.5 MG tablet [id number] Yes Take 1 tablet by mouth daily. D/c 100-25 mg dose McLean-Scocuzza, Nino Glow, MD Taking Active   metoprolol succinate (TOPROL-XL) 25 MG 24 hr tablet [id number] Yes TAKE 1 TABLET BY MOUTH EVERY DAY McLean-Scocuzza, Nino Glow, MD Taking Active   omeprazole (PRILOSEC) 40 MG capsule [id number] Yes Take 1 capsule (40 mg total) by mouth daily. McLean-Scocuzza, Nino Glow, MD Taking Active   [facility] ULTRA test strip [id number] Yes TEST TWICE A Marella Chimes, MD Taking Active Self  Potassium 99 MG TABS [id number] Yes Take by mouth. [provider] Taking Active            Med Note Darnelle Maffucci, Arville Lime   Fri Jun 21, 2021  9:55 AM) Patient thinks it is $Rem'600mg'CnPA$  which is slightly less than 10 mEq  sodium chloride (OCEAN) 0.65 % SOLN nasal spray [id number] Yes Place 1 spray into both nostrils as needed for congestion. McLean-Scocuzza, Nino Glow, MD Taking Active Self  tamsulosin (FLOMAX) 0.4 MG CAPS  capsule [id number] Yes Take 0.4 mg by mouth daily. [provider] Taking Active Self  triamcinolone ointment (KENALOG) 0.1 % [id number] Yes Apply 1 application topically 2 (two) times daily as needed (Rash). Apply to rash at ears twice a day as needed for up to 2 weeks at a time. Avoid applying to face, groin, and axilla Moye, [place], MD  Active   vitamin E 400 UNIT capsule [id number] Yes Take 400 Units by mouth daily. 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   Cholecystitis 11/11/2016   Chest pain 09/26/2016   Diplopia 08/21/2016   Renal failure 08/21/2016   Dyspnea on exertion 07/09/2016   Diabetes ([place]) 03/29/2016   Hematuria 08/07/2014   Routine general medical examination at a health care facility 09/08/2013   Hearing loss of aging 09/07/2013   Screening for prostate cancer 05/04/2013   Encounter for long-term (current) use of other medications 05/04/2013   Contusion of ankle or foot, right 05/07/2012   KNEE PAIN, RIGHT 12/20/2010   PROTEINURIA, MILD 05/14/2010   HIP PAIN, LEFT 11/13/2009   ESOPHAGEAL STRICTURE 12/28/2008   GERD 11/28/2008   Chronic constipation 11/28/2008   OTHER DYSPHAGIA 11/28/2008   HEMOCHROMATOSIS 11/28/2008   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[place]) 09/29/2008   URINARY CALCULUS 09/29/2008   OSTEOARTHRITIS, 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   DYSPHAGIA UNSPECIFIED 09/29/2008   Hypertension associated with diabetes ([place]) 01/07/2008   PAIN IN SOFT TISSUES OF  LIMB 01/07/2008   UNSPECIFIED ANEMIA 12/29/2007   Dyslipidemia 08/03/2007   Allergic rhinitis 08/03/2007   BPH (benign prostatic hyperplasia) 08/03/2007   COLONIC POLYPS, HX OF 08/03/2007    Immunization History  Administered Date(s) Administered   Fluad Quad(high Dose 65+) 07/27/2019, 08/01/2020   Influenza Whole 08/24/2009, 08/25/2011   Influenza, High Dose Seasonal PF 08/21/2016, 08/25/2017, 08/31/2018   Influenza,inj,Quad PF,6+ Mos 10/29/2015  Influenza-Unspecified 10/29/2015   Moderna SARS-COV2 Booster Vaccination 11/24/2020, 03/19/2021   Moderna Sars-Covid-2 Vaccination 01/05/2020, 02/02/2020   Pneumococcal Conjugate-13 01/23/2017   Pneumococcal Polysaccharide-23 09/25/2003, 03/27/2015   Td 09/25/2003   Tdap 08/31/2018    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   Interventions: 1:1 collaboration with McLean-Scocuzza, Nino Glow, MD regarding development and update of comprehensive plan of care as evidenced by provider attestation and co-signature Inter-disciplinary care team collaboration (see longitudinal plan of care) Comprehensive medication review performed; medication list updated in electronic medical record  Diabetes: Uncontrolled and suboptimally managed; current treatment: Novolin OTC 70/30 140-160 units QAM- usually 160; 40-60- usually 60 units QPM, Ozempic 0.25 mg weekly Denies any GI upset  since starting Ozempic. Reports that he feels the Ozempic has not been doing a lot for his blood sugars  Hx metformin: stopped by Dr. Loanne Drilling in 2014, documented reason to simplify regimen Hx pioglitazone: stopped by Dr. Loanne Drilling in 2014, documented reason to simplify regimen Hx glimepiride: stopped by Dr. Loanne Drilling in 2014, documented reason to simplify regimen Has been wearing Libre 2 CGM;  Date of Download: Last 14 days Average Glucose: 131 mg/dL - 117-> 135 -> 136 -> 134 Time in Goal:  - Time in range 70-180: 70% - Time above range: 26% - Time below range: 4% Increase Ozempic to 0.5 mg weekly. Discussed changing from 70/30 insulin to basal/bolus regimen. Patient declines to do so at this time, even with pursuing patient assistance. Advised to reduce Novolin 70/30 by ~ 10% to 140 units with breakfast and 50 units with supper.  Moving forward, recommend reinitiation of metformin to reduce insulin burden.   Hypertension: Controlled but fluctuant; current treatment: amlodipine 2.5 mg QPM, metoprolol succinate 25 mg QPM, losartan/HCTZ 100/12.5 mg QAM Previously recommended to continue current regimen. Will address control and home monitoring moving forward.   Hyperlipidemia and ASCVD risk reduction: Controlled per last lipid panel; current treatment: atorvastatin 20 mg daily;  Antiplatelet regimen: aspirin 81 mg daily  Previously recommended to continue current regimen at this time  GERD: Controlled per patient report; current regimen: omeprazole 20 mg daily Previously recommended to continue current regimen at this time  BPH: Controlled per patient report; current regimen: tamsulosin 0.4 mg daily Previously recommended to continue current regimen at this time  Health Maintenance: Due for shingrix vaccination. Will discuss moving forward   Patient Goals/Self-Care Activities Over the next 90 days, patient will:  - take medications as prescribed check glucose three times daily using  CGM, document, and provide at future appointments check blood pressure daily, document, and provide at future appointments collaborate with provider on medication access solutions  Follow Up Plan: Face to Face appointment with care management team member scheduled for:  ~ 8 weeks      Medication Assistance: None required.  Patient affirms current coverage meets needs.  Patient's preferred pharmacy is:  [facility], [place]  [place] [address] Phone: [phone] Fax: [place], [place] [address] [place] [address] Phone: [phone] Fax: [place] [address]), [place] [address] Phone: [phone] Fax: [phone]  [id number] [place], [place] [address] [address] Phone: [phone] Fax: [phone]   Follow Up:  Patient agrees to Care Plan and Follow-up.  Plan: Face to Face appointment with care management team member scheduled for: 8 weeks  Catie Darnelle Maffucci, PharmD, [place], [place] Clinical Pharmacist [employer] [facility] [employer] [phone]

## 2021-09-05 NOTE — Telephone Encounter (Signed)
See CCM documentation. Refill sent.

## 2021-09-06 ENCOUNTER — Other Ambulatory Visit: Payer: Self-pay | Admitting: Endocrinology

## 2021-09-06 DIAGNOSIS — E1165 Type 2 diabetes mellitus with hyperglycemia: Secondary | ICD-10-CM

## 2021-09-12 ENCOUNTER — Ambulatory Visit: Payer: PPO

## 2021-09-23 DIAGNOSIS — I152 Hypertension secondary to endocrine disorders: Secondary | ICD-10-CM

## 2021-09-23 DIAGNOSIS — E1159 Type 2 diabetes mellitus with other circulatory complications: Secondary | ICD-10-CM | POA: Diagnosis not present

## 2021-09-23 DIAGNOSIS — E785 Hyperlipidemia, unspecified: Secondary | ICD-10-CM | POA: Diagnosis not present

## 2021-09-23 DIAGNOSIS — E1165 Type 2 diabetes mellitus with hyperglycemia: Secondary | ICD-10-CM

## 2021-10-01 ENCOUNTER — Encounter: Payer: Self-pay | Admitting: Internal Medicine

## 2021-10-01 ENCOUNTER — Other Ambulatory Visit: Payer: Self-pay

## 2021-10-01 ENCOUNTER — Ambulatory Visit (INDEPENDENT_AMBULATORY_CARE_PROVIDER_SITE_OTHER): Payer: PPO | Admitting: Internal Medicine

## 2021-10-01 VITALS — BP 132/70 | HR 88 | Temp 96.2°F | Ht 72.01 in | Wt 217.2 lb

## 2021-10-01 DIAGNOSIS — Z0000 Encounter for general adult medical examination without abnormal findings: Secondary | ICD-10-CM

## 2021-10-01 DIAGNOSIS — Z23 Encounter for immunization: Secondary | ICD-10-CM | POA: Diagnosis not present

## 2021-10-01 DIAGNOSIS — E559 Vitamin D deficiency, unspecified: Secondary | ICD-10-CM | POA: Diagnosis not present

## 2021-10-01 DIAGNOSIS — Z1329 Encounter for screening for other suspected endocrine disorder: Secondary | ICD-10-CM | POA: Diagnosis not present

## 2021-10-01 DIAGNOSIS — Z125 Encounter for screening for malignant neoplasm of prostate: Secondary | ICD-10-CM | POA: Diagnosis not present

## 2021-10-01 DIAGNOSIS — R634 Abnormal weight loss: Secondary | ICD-10-CM | POA: Diagnosis not present

## 2021-10-01 DIAGNOSIS — I152 Hypertension secondary to endocrine disorders: Secondary | ICD-10-CM

## 2021-10-01 DIAGNOSIS — E1159 Type 2 diabetes mellitus with other circulatory complications: Secondary | ICD-10-CM

## 2021-10-01 DIAGNOSIS — R630 Anorexia: Secondary | ICD-10-CM | POA: Diagnosis not present

## 2021-10-01 DIAGNOSIS — R109 Unspecified abdominal pain: Secondary | ICD-10-CM

## 2021-10-01 DIAGNOSIS — N400 Enlarged prostate without lower urinary tract symptoms: Secondary | ICD-10-CM | POA: Diagnosis not present

## 2021-10-01 DIAGNOSIS — R197 Diarrhea, unspecified: Secondary | ICD-10-CM

## 2021-10-01 DIAGNOSIS — R5383 Other fatigue: Secondary | ICD-10-CM

## 2021-10-01 LAB — URINALYSIS, ROUTINE W REFLEX MICROSCOPIC
Bilirubin Urine: NEGATIVE
Hgb urine dipstick: NEGATIVE
Ketones, ur: NEGATIVE
Leukocytes,Ua: NEGATIVE
Nitrite: NEGATIVE
Specific Gravity, Urine: 1.025 (ref 1.000–1.030)
Total Protein, Urine: NEGATIVE
Urine Glucose: NEGATIVE
Urobilinogen, UA: 0.2 (ref 0.0–1.0)
pH: 5.5 (ref 5.0–8.0)

## 2021-10-01 LAB — MICROALBUMIN / CREATININE URINE RATIO
Creatinine,U: 156.2 mg/dL
Microalb Creat Ratio: 2.1 mg/g (ref 0.0–30.0)
Microalb, Ur: 3.3 mg/dL — ABNORMAL HIGH (ref 0.0–1.9)

## 2021-10-01 LAB — COMPREHENSIVE METABOLIC PANEL
ALT: 29 U/L (ref 0–53)
AST: 25 U/L (ref 0–37)
Albumin: 4.1 g/dL (ref 3.5–5.2)
Alkaline Phosphatase: 56 U/L (ref 39–117)
BUN: 16 mg/dL (ref 6–23)
CO2: 28 mEq/L (ref 19–32)
Calcium: 9.6 mg/dL (ref 8.4–10.5)
Chloride: 104 mEq/L (ref 96–112)
Creatinine, Ser: 1.2 mg/dL (ref 0.40–1.50)
GFR: 60.05 mL/min (ref >60.00)
Glucose, Bld: 81 mg/dL (ref 70–99)
Potassium: 4.1 mEq/L (ref 3.5–5.1)
Sodium: 140 mEq/L (ref 135–145)
Total Bilirubin: 1 mg/dL (ref 0.2–1.2)
Total Protein: 6.7 g/dL (ref 6.0–8.3)

## 2021-10-01 LAB — CBC WITH DIFFERENTIAL/PLATELET
Basophils Absolute: 0 10*3/uL (ref 0.0–0.1)
Basophils Relative: 0.5 % (ref 0.0–3.0)
Eosinophils Absolute: 1.2 10*3/uL — ABNORMAL HIGH (ref 0.0–0.7)
Eosinophils Relative: 14.7 % — ABNORMAL HIGH (ref 0.0–5.0)
HCT: 47.3 % (ref 39.0–52.0)
Hemoglobin: 16 g/dL (ref 13.0–17.0)
Lymphocytes Relative: 31.1 % (ref 12.0–46.0)
Lymphs Abs: 2.5 10*3/uL (ref 0.7–4.0)
MCHC: 33.8 g/dL (ref 30.0–36.0)
MCV: 96.6 fl (ref 78.0–100.0)
Monocytes Absolute: 0.8 10*3/uL (ref 0.1–1.0)
Monocytes Relative: 9.8 % (ref 3.0–12.0)
Neutro Abs: 3.5 10*3/uL (ref 1.4–7.7)
Neutrophils Relative %: 43.9 % (ref 43.0–77.0)
Platelets: 206 10*3/uL (ref 150.0–400.0)
RBC: 4.9 Mil/uL (ref 4.22–5.81)
RDW: 14 % (ref 11.5–15.5)
WBC: 7.9 10*3/uL (ref 4.0–10.5)

## 2021-10-01 LAB — LIPID PANEL
Cholesterol: 75 mg/dL (ref 0–200)
HDL: 34 mg/dL — ABNORMAL LOW (ref >39.00)
LDL Cholesterol: 24 mg/dL (ref 0–99)
NonHDL: 40.5
Total CHOL/HDL Ratio: 2
Triglycerides: 84 mg/dL (ref 0.0–149.0)
VLDL: 16.8 mg/dL (ref 0.0–40.0)

## 2021-10-01 LAB — PSA: PSA: 4.26 ng/mL — ABNORMAL HIGH (ref 0.10–4.00)

## 2021-10-01 LAB — TSH: TSH: 0.82 u[IU]/mL (ref 0.35–5.50)

## 2021-10-01 LAB — HEMOGLOBIN A1C: Hgb A1c MFr Bld: 6.4 % (ref 4.6–6.5)

## 2021-10-01 LAB — VITAMIN D 25 HYDROXY (VIT D DEFICIENCY, FRACTURES): VITD: 38.46 ng/mL (ref 30.00–100.00)

## 2021-10-01 NOTE — Patient Instructions (Addendum)
Consider 5th moderna in 2 weeks    Call and reschedule shoulder surgery   [facility]   [place], [place] [address]   [phone]   Poggi, Smith Mince, MD   [place]   [facility] [place], [place] [address]   [phone] (Work)   [phone] (Fax)   Rotator cuff tendinitis, right (Primary Dx);  Primary osteoarthritis of right shoulder;  DJD of right AC (acromioclavicular) joint    Colonoscopy 01/23/2022 MD No Physician   Primary Contact Information  Phone Fax E-mail Address  [phone] [email or web address] [address]     Specialties      Hypoglycemia Hypoglycemia occurs when the level of sugar (glucose) in the blood is too low. Hypoglycemia can happen in people who have or do not have diabetes. It can develop quickly, and it can be a medical emergency. For most people, a blood glucose level below 70 mg/dL (3.9 mmol/L) is considered hypoglycemia. Glucose is a type of sugar that provides the body's main source of energy. Certain hormones (insulin and glucagon) control the level of glucose in the blood. Insulin lowers blood glucose, and glucagon raises blood glucose. Hypoglycemia can result from having too much insulin in the bloodstream, or from not eating enough food that contains glucose. You may also have reactive hypoglycemia, which happens within 4 hours after eating a meal. What are the causes? Hypoglycemia occurs most often in people who have diabetes and may be caused by: Diabetes medicine. Not eating enough, or not eating often enough. Increased physical activity. Drinking alcohol on an empty stomach. If you do not have diabetes, hypoglycemia may be caused by: A tumor in the pancreas. Not eating enough, or not eating for long periods at a time (fasting). A severe infection or illness. Problems after having bariatric surgery. Organ failure, such as kidney or liver  failure. Certain medicines. What increases the risk? Hypoglycemia is more likely to develop in people who: Have diabetes and take medicines to lower blood glucose. Abuse alcohol. Have a severe illness. What are the signs or symptoms? Symptoms vary depending on whether the condition is mild, moderate, or severe. Mild hypoglycemia Hunger. Sweating and feeling clammy. Dizziness or feeling light-headed. Sleepiness or restless sleep. Nausea. Increased heart rate. Headache. Blurry vision. Mood changes, such as irritability or anxiety. Tingling or numbness around the mouth, lips, or tongue. Moderate hypoglycemia Confusion and poor judgment. Behavior changes. Weakness. Irregular heartbeat. A change in coordination. Severe hypoglycemia Severe hypoglycemia is a medical emergency. It can cause: Fainting. Seizures. Loss of consciousness (coma). Death. How is this diagnosed? Hypoglycemia is diagnosed with a blood test to measure your blood glucose level. This blood test is done while you are having symptoms. Your health care provider may also do a physical exam and review your medical history. How is this treated? This condition can be treated by immediately eating or drinking something that contains sugar with 15 grams of fast-acting carbohydrate, such as: 4 oz (120 mL) of fruit juice. 4 oz (120 mL) of regular soda (not diet soda). Several pieces of hard candy. Check food labels to find out how many pieces to eat for 15 grams. 1 Tbsp (15 mL) of sugar or honey. 4 glucose tablets. 1 tube of glucose gel. Treating hypoglycemia if you have diabetes If you are alert and able to swallow safely, follow the 15:15 rule: Take 15 grams of a fast-acting carbohydrate. Talk with  your health care provider about how much you should take. Options for getting 15 grams of fast-acting carbohydrate include: Glucose tablets (take 4 tablets). Several pieces of hard candy. Check food labels to find out how  many pieces to eat for 15 grams. 4 oz (120 mL) of fruit juice. 4 oz (120 mL) of regular soda (not diet soda). 1 Tbsp (15 mL) of sugar or honey. 1 tube of glucose gel. Check your blood glucose 15 minutes after you take the carbohydrate. If the repeat blood glucose level is still at or below 70 mg/dL (3.9 mmol/L), take 15 grams of a carbohydrate again. If your blood glucose level does not increase above 70 mg/dL (3.9 mmol/L) after 3 tries, seek emergency medical care. After your blood glucose level returns to normal, eat a meal or a snack within 1 hour.  Treating severe hypoglycemia Severe hypoglycemia is when your blood glucose level is below 54 mg/dL (3 mmol/L). Severe hypoglycemia is a medical emergency. Get medical help right away. If you have severe hypoglycemia and you cannot eat or drink, you will need to be given glucagon. A family member or close friend should learn how to check your blood glucose and how to give you glucagon. Ask your health care provider if you need to have an emergency glucagon kit available. Severe hypoglycemia may need to be treated in a hospital. The treatment may include getting glucose through an IV. You may also need treatment for the cause of your hypoglycemia. Follow these instructions at home: General instructions Take over-the-counter and prescription medicines only as told by your health care provider. Monitor your blood glucose as told by your health care provider. If you drink alcohol: Limit how much you have to: 0-1 drink a day for women who are not pregnant. 0-2 drinks a day for men. Know how much alcohol is in your drink. In the [place] one drink equals one 12 oz bottle of beer (355 mL), one 5 oz glass of wine (148 mL), or one 1 oz glass of hard liquor (44 mL). Be sure to eat food along with drinking alcohol. Be aware that alcohol is absorbed quickly and may have lingering effects that may result in hypoglycemia later. Be sure to do ongoing glucose  monitoring. Keep all follow-up visits. This is important. If you have diabetes: Always have a fast-acting carbohydrate (15 grams) option with you to treat low blood glucose. Follow your diabetes management plan as directed by your health care provider. Make sure you: Know the symptoms of hypoglycemia. It is important to treat it right away to prevent it from becoming severe. Check your blood glucose as often as told. Always check before and after exercise. Always check your blood glucose before you drive a motorized vehicle. Take your medicines as told. Follow your meal plan. Eat on time, and do not skip meals. Share your diabetes management plan with people in your workplace, school, and household. Carry a medical alert card or wear medical alert jewelry. Where to find more information American Diabetes Association: www.diabetes.org Contact a health care provider if: You have problems keeping your blood glucose in your target range. You have frequent episodes of hypoglycemia. Get help right away if: You continue to have hypoglycemia symptoms after eating or drinking something that contains 15 grams of fast-acting carbohydrate, and you cannot get your blood glucose above 70 mg/dL (3.9 mmol/L) while following the 15:15 rule. Your blood glucose is below 54 mg/dL (3 mmol/L). You have a seizure. You faint.  These symptoms may represent a serious problem that is an emergency. Do not wait to see if the symptoms will go away. Get medical help right away. Call your local emergency services (911 in the [place] Do not drive yourself to the hospital. Summary Hypoglycemia occurs when the level of sugar (glucose) in the blood is too low. Hypoglycemia can happen in people who have or do not have diabetes. It can develop quickly, and it can be a medical emergency. Make sure you know the symptoms of hypoglycemia and how to treat it. Always have a fast-acting carbohydrate option with you to treat low blood  sugar. This information is not intended to replace advice given to you by your health care provider. Make sure you discuss any questions you have with your health care provider. Document Revised: 10/11/2020 Document Reviewed: 10/11/2020 Elsevier Patient Education  2022 [employer].

## 2021-10-01 NOTE — Progress Notes (Signed)
Chief Complaint  Patient presents with   Follow-up   Annual  1.htn with Dm 2 with hyper/hypoglycemia on ozempic 0.25 weekly nph 70/30 150 in am and 40-60 mg qhs cbgs range 50s-200s at times 50s-70s 4 pm to 5 pm pt will reduced nph 70/3- 140 in the am  On hyzaar 100-12.5 mg qd, toprol xl 25 mg qd, lipitor 20 mg qhs, norvasc 2.5 mg qd  About 2 weeks ago he had loss of appetite, diarrhea, gas stomach pain but resolved now. Last stools 2x per day was 4-5 days ago and felt a lot of gas. No Abx, sick contacts new food sx's resolved for now We disc wondering if medication side effect I.e ozempic    Review of Systems  Constitutional:  Positive for weight loss.       Lost 9 lbs  HENT:  Negative for hearing loss.   Eyes:  Negative for blurred vision.  Respiratory:  Negative for shortness of breath.   Cardiovascular:  Negative for chest pain.  Gastrointestinal:  Negative for abdominal pain and diarrhea.  Musculoskeletal:  Negative for falls and joint pain.  Skin:  Negative for rash.  Neurological:  Negative for headaches.  Psychiatric/Behavioral:  Negative for memory loss.   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 09/29/2008   Diabetes mellitus without complication ([place])    x 32-67 years as of 11/08/18    DIABETIC RETINOPATHY, BACKGROUND 09/29/2008   DYSPHAGIA UNSPECIFIED 09/29/2008   Dysplastic nevus 02/20/2021   L post thigh - mild    ESOPHAGEAL STRICTURE 12/28/2008   GERD 11/28/2008   History of kidney stones    HYPERLIPIDEMIA 08/03/2007   Kidney stone    Kidney stones    Dr. 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   PAIN IN SOFT TISSUES OF LIMB 01/07/2008   UNSPECIFIED ANEMIA 12/29/2007   Unspecified essential  hypertension 01/07/2008   URINARY CALCULUS 09/29/2008   Urine incontinence    Past Surgical History:  Procedure Laterality Date   CHOLECYSTECTOMY N/A 11/12/2016   Procedure: LAPAROSCOPIC CHOLECYSTECTOMY WITH INTRAOPERATIVE CHOLANGIOGRAM;  Surgeon: Clayburn Pert, MD;  Location: [facility] ORS;  Service: General;  Laterality: N/A;   COLONOSCOPY     polyp   dupyretens contracture     right hand surgery,left hand    HAND SURGERY Right    2016   LITHOTRIPSY     SHOULDER SURGERY Left    left, cyst and tumor removed   URINARY SURGERY  2018   Dr Daiva Eves "stretched" urethra around prostate to increase urine flow   Family History  Problem Relation Age of Onset   Diabetes Mother    Depression Mother    Prostate cancer Brother    Diabetes Brother    Heart disease Brother    Diabetes Brother    Colon cancer Neg Hx    Esophageal cancer Neg Hx    Rectal cancer Neg Hx    Stomach cancer Neg Hx    Social History   Socioeconomic History   Marital status: Married    Spouse name: Not on file   Number of children: 2   Years of education: Not on file   Highest  education level: Not on file  Occupational History   Occupation: Metal Fabrication    Employer: [employer]  Tobacco Use   Smoking status: Never   Smokeless tobacco: Never  Vaping Use   Vaping Use: Never used  Substance and Sexual Activity   Alcohol use: Yes    Comment: occasional beer   Drug use: No   Sexual activity: Not on file  Other Topics Concern   Not on file  Social History Narrative   Daily Caffeine Use 1-2 daily   Married    Never smoker    Wears seat belt, safe in relationship    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   amLODipine (NORVASC) 2.5 MG tablet Take 1 tablet (2.5 mg total) by mouth daily.   Ascorbic Acid (VITAMIN C) 1000 MG tablet Take 1,000 mg by mouth daily.   aspirin 81 MG tablet Take 81 mg by mouth daily.   atorvastatin (LIPITOR) 20 MG tablet Take 1 tablet (20 mg total) by mouth daily at 6 PM.   Azelastine HCl 137 MCG/SPRAY SOLN Place 2 sprays into both nostrils 2 (two) times daily.   calcium carbonate (OSCAL) 1500 (600 Ca) MG TABS tablet Take 600 mg of elemental calcium by mouth daily with breakfast.   Continuous Blood Gluc Receiver (FREESTYLE LIBRE 2 READER) DEVI Use to check glucose at least TID   Continuous Blood Gluc Sensor (FREESTYLE LIBRE 2 SENSOR) MISC Use to check glucose at least three times daily   cyanocobalamin (,VITAMIN B-12,) 1000 MCG/ML injection Inject 1 mL (1,000 mcg total) into the muscle every 30 (thirty) days.   diclofenac Sodium (VOLTAREN) 1 % GEL Apply 2 g topically 4 (four) times daily. Prn (Patient taking differently: Apply 2 g topically 4 (four) times daily as needed (pain).)   Fluocinolone Acetonide 0.01 % OIL Place 5 drops in ear(s) 2 (two) times daily as needed. X1- 2 weeks (Patient taking differently: Place 5 drops in ear(s) as needed.)   fluticasone (FLONASE) 50 MCG/ACT nasal spray Place 2 sprays into both nostrils daily.   insulin NPH-regular Human (NOVOLIN 70/30 RELION) (70-30) 100 UNIT/ML injection 140 units with breakfast and 50 units with supper   Insulin Syringe-Needle U-100 (RELION INSULIN SYR 1CC/30G) 30G X 5/16" 1 ML MISC 1 each by Does not apply route 2 (two) times a day. Use to inject insulin BID; E11.9   Lancets (ONETOUCH ULTRASOFT) lancets Use as instructed 2x daily Dx: 250.00   losartan-hydrochlorothiazide (HYZAAR) 100-12.5 MG tablet Take 1 tablet by mouth  daily. D/c 100-25 mg dose   metoprolol succinate (TOPROL-XL) 25 MG 24 hr tablet TAKE 1 TABLET BY MOUTH EVERY DAY   omeprazole (PRILOSEC) 40 MG capsule Take 1 capsule (40 mg total) by mouth daily.   ONETOUCH ULTRA test strip TEST TWICE A DAY   Potassium 99 MG TABS Take by mouth.   Semaglutide,0.25 or 0.5MG /DOS, (OZEMPIC, 0.25 OR 0.5 MG/DOSE,) 2 MG/1.5ML SOPN Inject 0.5 mg into the  skin once a week.   sodium chloride (OCEAN) 0.65 % SOLN nasal spray Place 1 spray into both nostrils as needed for congestion.   tamsulosin (FLOMAX) 0.4 MG CAPS capsule Take 0.4 mg by mouth daily.   triamcinolone ointment (KENALOG) 0.1 % Apply 1 application topically 2 (two) times daily as needed (Rash). Apply to rash at ears twice a day as needed for up to 2 weeks at a time. Avoid applying to face, groin, and axilla   vitamin E 400 UNIT capsule Take 400 Units by mouth daily.   Current Facility-Administered Medications for the 10/01/21 encounter (Office Visit) with McLean-Scocuzza, Nino Glow, MD  Medication   cyanocobalamin ((VITAMIN B-12)) injection 1,000 mcg   Allergies  Allergen Reactions   Sulfamethoxazole-Trimethoprim Rash   No results found for this or any previous visit (from the past 2160 hour(s)). Objective  Body mass index is 29.45 kg/m. Wt Readings from Last 3 Encounters:  10/01/21 217 lb 3.2 oz (98.5 kg)  04/11/21 226 lb 9.6 oz (102.8 kg)  03/29/21 225 lb (102.1 kg)   Temp Readings from Last 3 Encounters:  10/01/21 (!) 96.2 F (35.7 C)  03/29/21 (!) 97.5 F (36.4 C) (Oral)  09/25/20 97.7 F (36.5 C) (Oral)   BP Readings from Last 3 Encounters:  10/01/21 132/70  04/11/21 (!) 144/68  03/29/21 124/60   Pulse Readings from Last 3 Encounters:  10/01/21 88  04/11/21 78  03/29/21 84    Physical Exam Vitals and nursing note reviewed.  Constitutional:      Appearance: Normal appearance. He is well-developed and well-groomed. He is obese.  HENT:     Head: Normocephalic and atraumatic.   Eyes:     Conjunctiva/sclera: Conjunctivae normal.     Pupils: Pupils are equal, round, and reactive to light.  Cardiovascular:     Rate and Rhythm: Normal rate and regular rhythm.     Heart sounds: Normal heart sounds.  Pulmonary:     Effort: Pulmonary effort is normal. No respiratory distress.     Breath sounds: Normal breath sounds.  Abdominal:     Tenderness: There is no abdominal tenderness.  Musculoskeletal:     Lumbar back: Tenderness present. Negative right straight leg raise test and negative left straight leg raise test.  Skin:    General: Skin is warm and moist.  Neurological:     General: No focal deficit present.     Mental Status: He is alert and oriented to person, place, and time. Mental status is at baseline.     Sensory: Sensation is intact.     Motor: Motor function is intact.     Coordination: Coordination is intact.     Gait: Gait is intact. Gait normal.  Psychiatric:        Attention and Perception: Attention and perception normal.        Mood and Affect: Mood and affect normal.        Speech: Speech normal.        Behavior: Behavior normal. Behavior is cooperative.        Thought Content: Thought content normal.        Cognition and Memory: Cognition and memory normal.        Judgment: Judgment normal.    Assessment  Plan  Annual physical exam Flu shot utd given today Tdap utd 08/31/18  prevnar and pna 23 utd  Consider shingrix vaccine in future ordered to pharmacy x 2 doses covid 19 vx moderna 4/4 consider 5th shot  Hep A immune, consider hep B new vaccine x 2 doses in future with h/o fatty liver  Hep C negative 07/09/16    PSA-ordered PSA today established Dr. Rogers Blocker seen  9 or 10/21 f/u in 1 year  Get copy of labs and notes   Check PSA 2021 and f/u Dr. Rogers Blocker Seen 09/03/20 Dr. 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 Fuller Plan 01/24/2019 5 polyps tubular repeat in 3 years    Dermatology established with [facility] last seen 12/02/17  tbse referred [place] [facility]    Dr. Raliegh Ip cards appt 08/2019 had stress test not pfts yet Est  eye    Pharmacy [facility] insulin  otherwise [facility] [place]   Rec healthy diet and exercise   Diarrhea, resolved all sxs below I think could have been due to ozempic 0.25 weekly and not had in 2 weeks if returns will do further work up D/c ozempic Abdominal pain, unspecified abdominal location - Plan: Comprehensive metabolic panel Weight loss could be due to ozempic Loss of appetite  Hypertension associated with diabetes ([place]) - Plan: Comprehensive metabolic panel, Lipid panel, CBC with Differential/Platelet, Hemoglobin A1c, Urinalysis, Routine w reflex microscopic, Microalbumin / creatinine urine ratio Taking 70/30 150 in am but having lows 50s-70s reduce to 140 qam and taking 40-60 qhs Stop ozempic with low cbg and diarrhea/ab pain F/u pharm D as scheduled  Cont norvasc 2.5 mg qd Lipitor 20 mg qhs,  Hyzaar 100-12.5 mg qd  Toprol xl 25 mg qd  Monitor BP   Provider: Dr. Olivia Mackie McLean-Scocuzza-Internal Medicine

## 2021-10-01 NOTE — Progress Notes (Signed)
Please schedule an office visit to evaluate GI symptoms and to evaluate for recall colonoscopy.

## 2021-10-03 ENCOUNTER — Telehealth: Payer: Self-pay | Admitting: Internal Medicine

## 2021-10-03 NOTE — Telephone Encounter (Signed)
Returned the call and was informed that [facility] was not available. Left a verbal message for them to call back.

## 2021-10-03 NOTE — Telephone Encounter (Signed)
Patient returned office phone call for lab results. 

## 2021-10-07 ENCOUNTER — Other Ambulatory Visit: Payer: Self-pay | Admitting: Internal Medicine

## 2021-10-15 ENCOUNTER — Ambulatory Visit: Payer: PPO

## 2021-10-15 ENCOUNTER — Telehealth: Payer: Self-pay | Admitting: Pharmacist

## 2021-10-15 NOTE — Telephone Encounter (Signed)
  Chronic Care Management   Note  10/15/2021 Name: [name] MRN: [id number] DOB: 1946/04/04   Patient did not present for scheduled in office visit.  Plan: - If I do not hear back from the patient by end of business today, will collaborate with Care Guide to outreach to schedule follow up with me   Catie Darnelle Maffucci, PharmD, [place], [place] Pharmacist [employer] [facility] [employer] [phone]

## 2021-10-21 DIAGNOSIS — R972 Elevated prostate specific antigen [PSA]: Secondary | ICD-10-CM | POA: Diagnosis not present

## 2021-10-21 DIAGNOSIS — N281 Cyst of kidney, acquired: Secondary | ICD-10-CM | POA: Diagnosis not present

## 2021-10-21 DIAGNOSIS — N401 Enlarged prostate with lower urinary tract symptoms: Secondary | ICD-10-CM | POA: Diagnosis not present

## 2021-10-21 DIAGNOSIS — N5201 Erectile dysfunction due to arterial insufficiency: Secondary | ICD-10-CM | POA: Diagnosis not present

## 2021-10-22 ENCOUNTER — Other Ambulatory Visit: Payer: Self-pay | Admitting: Urology

## 2021-10-22 DIAGNOSIS — R972 Elevated prostate specific antigen [PSA]: Secondary | ICD-10-CM

## 2021-11-05 ENCOUNTER — Ambulatory Visit
Admission: RE | Admit: 2021-11-05 | Discharge: 2021-11-05 | Disposition: A | Discharge status: Home / Self Care | Payer: PPO | Source: Ambulatory Visit | Attending: Urology | Admitting: Urology

## 2021-11-05 ENCOUNTER — Other Ambulatory Visit: Payer: Self-pay

## 2021-11-05 DIAGNOSIS — R972 Elevated prostate specific antigen [PSA]: Secondary | ICD-10-CM | POA: Diagnosis not present

## 2021-11-05 DIAGNOSIS — R590 Localized enlarged lymph nodes: Secondary | ICD-10-CM | POA: Diagnosis not present

## 2021-11-05 DIAGNOSIS — N501 Vascular disorders of male genital organs: Secondary | ICD-10-CM | POA: Diagnosis not present

## 2021-11-05 DIAGNOSIS — N400 Enlarged prostate without lower urinary tract symptoms: Secondary | ICD-10-CM | POA: Diagnosis not present

## 2021-11-05 IMAGING — MR MR PROSTATE WO/W CM
56 series · 56 of 56 positions shown · IV contrast (9ml Gadavist)
Comparison: None.

CLINICAL DATA: 75-year-old male with elevated PSA 4.3. History
TURP. No prostate biopsy.

EXAM:
MR PROSTATE WITHOUT AND WITH CONTRAST
TECHNIQUE: Multiplanar multisequence MRI images were obtained of the pelvis
centered about the prostate. Pre and post contrast images were
obtained.
CONTRAST:  9mL GADAVIST GADOBUTROL 1 MMOL/ML IV SOLN

[Series 3: ax in&out whole · axial · 3.0mm · 1.19mm/px · 1 of 88 slices shown (1 of 2)]
[im 1/88]
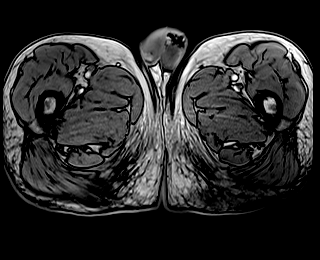

[Series 4: ax in&out whole · axial · 3.0mm · 1.19mm/px · 1 of 88 slices shown (2 of 2)]
[im 1/88]
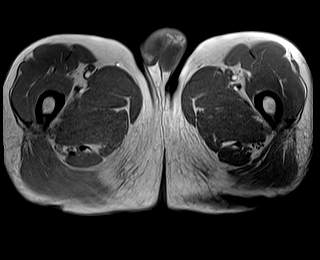

[Series 5: T2 · coronal · 3.0mm · 0.70mm/px · 1 of 35 slices shown (1 of 3)]
[im 1/35]
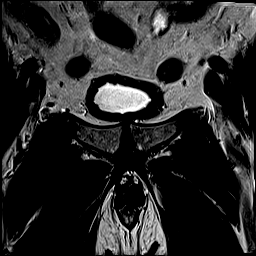

[Series 6: T2 · axial · 3.0mm · 0.56mm/px · 1 of 27 slices shown (2 of 3)]
[im 1/27]
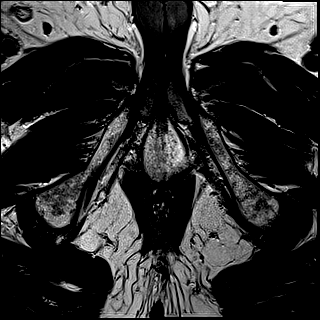

[Series 7: DWI · axial · 3.0mm · 0.86mm/px · 1 of 81 slices shown (1 of 3)]
[im 1/81]
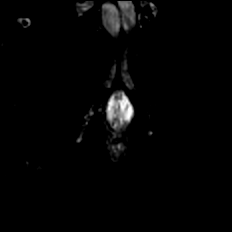

[Series 8: DWI · axial · 3.0mm · 0.86mm/px · 1 of 27 slices shown (2 of 3)]
[im 1/27]
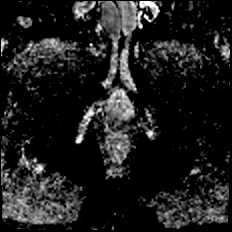

[Series 9: DWI · axial · 3.0mm · 0.86mm/px · 1 of 27 slices shown (3 of 3)]
[im 1/27]
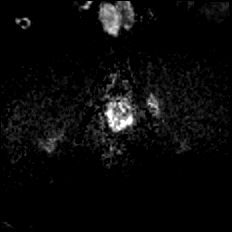

[Series 10: T2 · axial · 1.0mm · 1.04mm/px · 1 of 72 slices shown (3 of 3)]
[im 1/72]
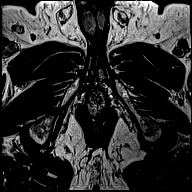

[Series 11: T1 · axial · 3.0mm · 1.15mm/px · 1 of 28 slices shown (1 of 48)]
[im 1/28]
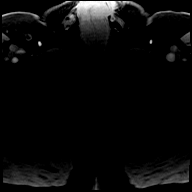

[Series 12: T1 · axial · 3.0mm · 1.15mm/px · 1 of 28 slices shown (2 of 48)]
[im 1/28]
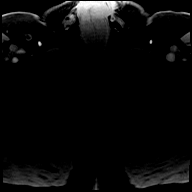

[Series 13: T1 · axial · 3.0mm · 1.15mm/px · 1 of 26 slices shown (3 of 48)]
[im 1/26]
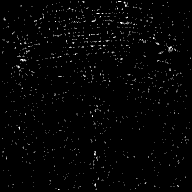

[Series 14: T1 · axial · 3.0mm · 1.15mm/px · 1 of 28 slices shown (4 of 48)]
[im 1/28]
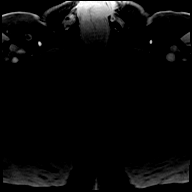

[Series 15: T1 · axial · 3.0mm · 1.15mm/px · 1 of 28 slices shown (5 of 48)]
[im 1/28]
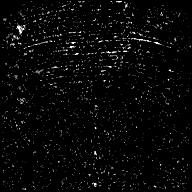

[Series 16: T1 · axial · 3.0mm · 1.15mm/px · 1 of 28 slices shown (6 of 48)]
[im 1/28]
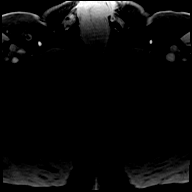

[Series 17: T1 · axial · 3.0mm · 1.15mm/px · 1 of 28 slices shown (7 of 48)]
[im 1/28]
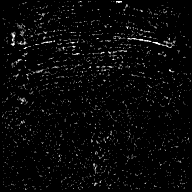

[Series 18: T1 · axial · 3.0mm · 1.15mm/px · 1 of 28 slices shown (8 of 48)]
[im 1/28]
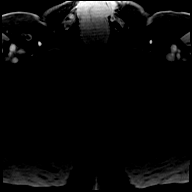

[Series 19: T1 · axial · 3.0mm · 1.15mm/px · 1 of 28 slices shown (9 of 48)]
[im 1/28]
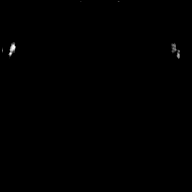

[Series 20: T1 · axial · 3.0mm · 1.15mm/px · 1 of 28 slices shown (10 of 48)]
[im 1/28]
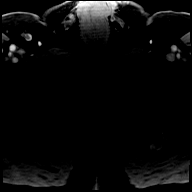

[Series 21: T1 · axial · 3.0mm · 1.15mm/px · 1 of 28 slices shown (11 of 48)]
[im 1/28]
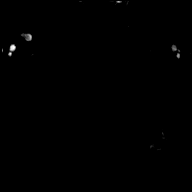

[Series 22: T1 · axial · 3.0mm · 1.15mm/px · 1 of 28 slices shown (12 of 48)]
[im 1/28]
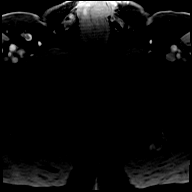

[Series 23: T1 · axial · 3.0mm · 1.15mm/px · 1 of 28 slices shown (13 of 48)]
[im 1/28]
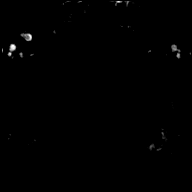

[Series 24: T1 · axial · 3.0mm · 1.15mm/px · 1 of 28 slices shown (14 of 48)]
[im 1/28]
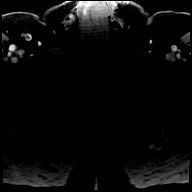

[Series 25: T1 · axial · 3.0mm · 1.15mm/px · 1 of 28 slices shown (15 of 48)]
[im 1/28]
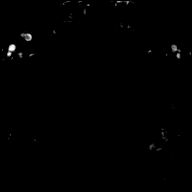

[Series 26: T1 · axial · 3.0mm · 1.15mm/px · 1 of 28 slices shown (16 of 48)]
[im 1/28]
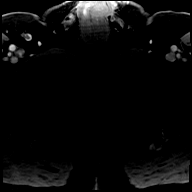

[Series 27: T1 · axial · 3.0mm · 1.15mm/px · 1 of 28 slices shown (17 of 48)]
[im 1/28]
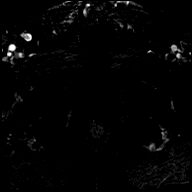

[Series 28: T1 · axial · 3.0mm · 1.15mm/px · 1 of 28 slices shown (18 of 48)]
[im 1/28]
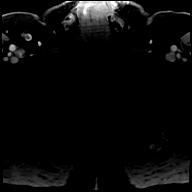

[Series 29: T1 · axial · 3.0mm · 1.15mm/px · 1 of 28 slices shown (19 of 48)]
[im 1/28]
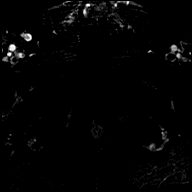

[Series 30: T1 · axial · 3.0mm · 1.15mm/px · 1 of 28 slices shown (20 of 48)]
[im 1/28]
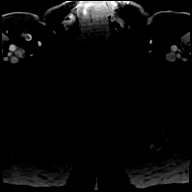

[Series 31: T1 · axial · 3.0mm · 1.15mm/px · 1 of 28 slices shown (21 of 48)]
[im 1/28]
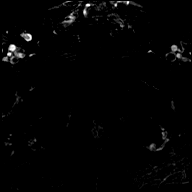

[Series 32: T1 · axial · 3.0mm · 1.15mm/px · 1 of 28 slices shown (22 of 48)]
[im 1/28]
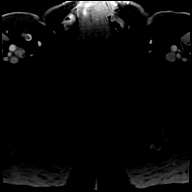

[Series 33: T1 · axial · 3.0mm · 1.15mm/px · 1 of 28 slices shown (23 of 48)]
[im 1/28]
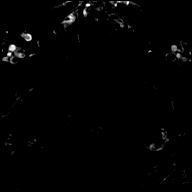

[Series 34: T1 · axial · 3.0mm · 1.15mm/px · 1 of 28 slices shown (24 of 48)]
[im 1/28]
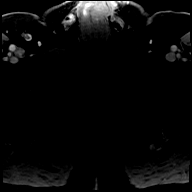

[Series 35: T1 · axial · 3.0mm · 1.15mm/px · 1 of 28 slices shown (25 of 48)]
[im 1/28]
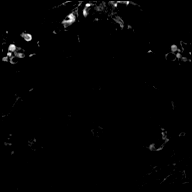

[Series 36: T1 · axial · 3.0mm · 1.15mm/px · 1 of 28 slices shown (26 of 48)]
[im 1/28]
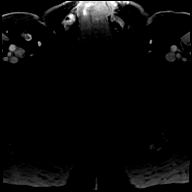

[Series 37: T1 · axial · 3.0mm · 1.15mm/px · 1 of 28 slices shown (27 of 48)]
[im 1/28]
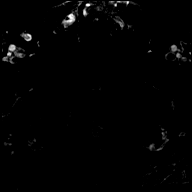

[Series 38: T1 · axial · 3.0mm · 1.15mm/px · 1 of 28 slices shown (28 of 48)]
[im 1/28]
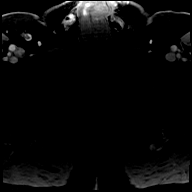

[Series 39: T1 · axial · 3.0mm · 1.15mm/px · 1 of 28 slices shown (29 of 48)]
[im 1/28]
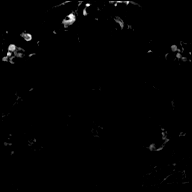

[Series 40: T1 · axial · 3.0mm · 1.15mm/px · 1 of 28 slices shown (30 of 48)]
[im 1/28]
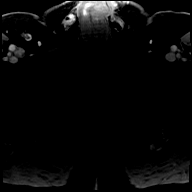

[Series 41: T1 · axial · 3.0mm · 1.15mm/px · 1 of 28 slices shown (31 of 48)]
[im 1/28]
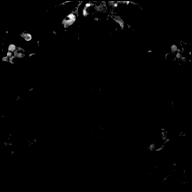

[Series 42: T1 · axial · 3.0mm · 1.15mm/px · 1 of 28 slices shown (32 of 48)]
[im 1/28]
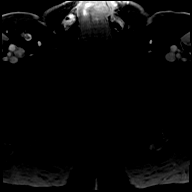

[Series 43: T1 · axial · 3.0mm · 1.15mm/px · 1 of 28 slices shown (33 of 48)]
[im 1/28]
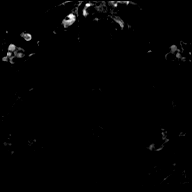

[Series 44: T1 · axial · 3.0mm · 1.15mm/px · 1 of 28 slices shown (34 of 48)]
[im 1/28]
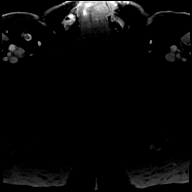

[Series 45: T1 · axial · 3.0mm · 1.15mm/px · 1 of 28 slices shown (35 of 48)]
[im 1/28]
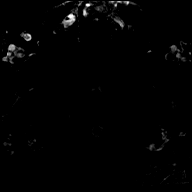

[Series 46: T1 · axial · 3.0mm · 1.15mm/px · 1 of 28 slices shown (36 of 48)]
[im 1/28]
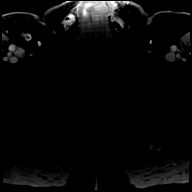

[Series 47: T1 · axial · 3.0mm · 1.15mm/px · 1 of 28 slices shown (37 of 48)]
[im 1/28]
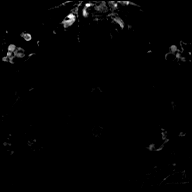

[Series 48: T1 · axial · 3.0mm · 1.15mm/px · 1 of 28 slices shown (38 of 48)]
[im 1/28]
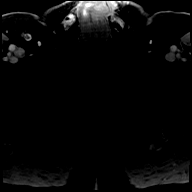

[Series 49: T1 · axial · 3.0mm · 1.15mm/px · 1 of 28 slices shown (39 of 48)]
[im 1/28]
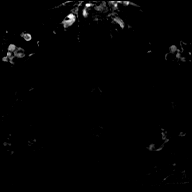

[Series 50: T1 · axial · 3.0mm · 1.15mm/px · 1 of 28 slices shown (40 of 48)]
[im 1/28]
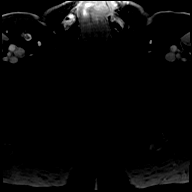

[Series 51: T1 · axial · 3.0mm · 1.15mm/px · 1 of 28 slices shown (41 of 48)]
[im 1/28]
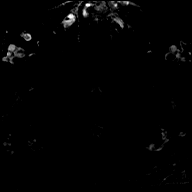

[Series 52: T1 · axial · 3.0mm · 1.15mm/px · 1 of 28 slices shown (42 of 48)]
[im 1/28]
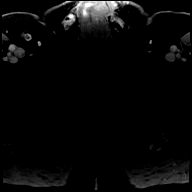

[Series 53: T1 · axial · 3.0mm · 1.15mm/px · 1 of 28 slices shown (43 of 48)]
[im 1/28]
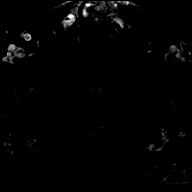

[Series 54: T1 · axial · 3.0mm · 1.15mm/px · 1 of 28 slices shown (44 of 48)]
[im 1/28]
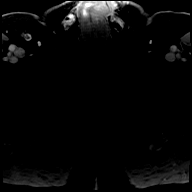

[Series 55: T1 · axial · 3.0mm · 1.15mm/px · 1 of 28 slices shown (45 of 48)]
[im 1/28]
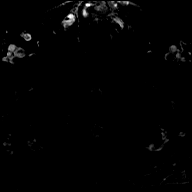

[Series 56: T1 · axial · 3.0mm · 1.15mm/px · 1 of 28 slices shown (46 of 48)]
[im 1/28]
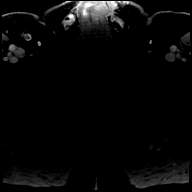

[Series 57: T1 · axial · 3.0mm · 1.15mm/px · 1 of 28 slices shown (47 of 48)]
[im 1/28]
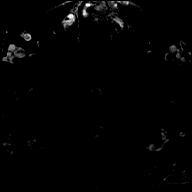

[Series 58: T1 · axial · 3.0mm · 1.15mm/px · 1 of 28 slices shown (48 of 48)]
[im 1/28]
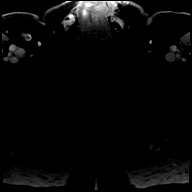

[56 of 56 positions shown; findings below may reference images not displayed]

FINDINGS: Prostate: Focus restricted diffusion within the LEFT peripheral zone
lateral base measuring 1.9 x 1.2 cm (image 12/series 8). The lesion
bulges the capsule at this level.

T2 weighted imaging demonstrates a corresponding low signal
intensity lesion (image 36/series 10). Along the anterior margin of
this lesion, there is loss of the capsule definition over short
segment concerning for 1-2 mm extracapsular extension (image
39/series 10).

No additional foci of restricted diffusion within the peripheral
zone. The transitional zone is enlarged by capsulated nodules
without suspicious imaging characteristics.

Volume: 6.6 x 4.3 by 4.7 cm (volume = 70 cm^3).

Transcapsular spread: Potential 1 -2 mm trans scapular spread in the
anterior aspect the LEFT base as described above.

Seminal vesicle involvement: There is loss of the normal high signal
intensity within the RIGHT similar vesicle as well as contraction of
similar vesicle. Small amount of hemorrhage in the contracted RIGHT
Seminal vesicles. No abnormal enhancement or restricted diffusion.
Favored benign contraction of the vessel.

Neurovascular bundle involvement: Absent

Pelvic adenopathy: 4 mm LEFT external iliac lymph node (image
26/series 61)

Bone metastasis: Absent

Other findings: Absent
IMPRESSION: 1. Lesion in the LEFT lateral base concerning for high-grade
prostate carcinoma. PI-RADS: 5. (Dynacad 3D post processing
performed). ALEK #1.
2. Concern for subtle transscapular spread (1-2 mm) along the
anterior LEFT base / mid gland.
3. Enlarged nodular transitional zone most consistent benign
prostate hypertrophy PI-RADS: 2

## 2021-11-05 MED ORDER — GADOBUTROL 1 MMOL/ML IV SOLN
9.0000 mL | Freq: Once | INTRAVENOUS | Status: AC | PRN
  Administered 2021-11-05: 9 mL via INTRAVENOUS

## 2021-11-06 ENCOUNTER — Encounter: Payer: Self-pay | Admitting: Gastroenterology

## 2021-11-06 ENCOUNTER — Ambulatory Visit: Payer: PPO | Admitting: Gastroenterology

## 2021-11-06 VITALS — BP 146/68 | HR 64 | Ht 72.0 in | Wt 224.1 lb

## 2021-11-06 DIAGNOSIS — R197 Diarrhea, unspecified: Secondary | ICD-10-CM | POA: Diagnosis not present

## 2021-11-06 DIAGNOSIS — Z86010 Personal history of colonic polyps: Secondary | ICD-10-CM | POA: Diagnosis not present

## 2021-11-06 DIAGNOSIS — R634 Abnormal weight loss: Secondary | ICD-10-CM | POA: Diagnosis not present

## 2021-11-06 NOTE — Progress Notes (Signed)
+ ° ° °  History of Present Illness: This is a 75 year old male with abdominal pain, diarrhea and weight loss that started while taking Ozempic. His symptoms have completely resolved after stopping Ozempic. His reflux symptoms are controlled. No current GI complaints.   CBC, CMP on 11/8 were unremarkable  Colonoscopy 01/2019 - Five 6 to 8 mm polyps in the transverse colon and in the cecum, removed with a cold snare. Resected and retrieved. - Otherwise normal appearing colonoscopy. Path: tubular adenomas  Current Medications, Allergies, Past Medical History, Past Surgical History, Family History and Social History were reviewed in [employer] record.   Physical Exam: General: Well developed, well nourished, no acute distress Head: Normocephalic and atraumatic Eyes: Sclerae anicteric, EOMI Ears: Normal auditory acuity Mouth: Not examined, mask on during Covid-19 pandemic Lungs: Clear throughout to auscultation Heart: Regular rate and rhythm; no murmurs, rubs or bruits Abdomen: Soft, non tender and non distended. No masses, hepatosplenomegaly or hernias noted. Normal Bowel sounds Rectal: Not done Musculoskeletal: Symmetrical with no gross deformities  Pulses:  Normal pulses noted Extremities: No clubbing, cyanosis, edema or deformities noted Neurological: Alert oriented x 4, grossly nonfocal Psychological:  Alert and cooperative. Normal mood and affect   Assessment and Recommendations:  Abdominal pain diarrhea, weight loss have resolved off Ozempic. GERD.  Follow antireflux measures and continue omeprazole 40 mg p.o. daily. Personal history of adenomatous colon polyps.  A 3-year interval surveillance colonoscopy is recommended in March 2023.

## 2021-11-06 NOTE — Patient Instructions (Signed)
You will be due for a recall colonoscopy in 01/2022. We will send you a reminder in the mail when it gets closer to that time.  The [place] [facility] providers would like to encourage you to use [facility] to communicate with providers for non-urgent requests or questions.  Due to long hold times on the telephone, sending your provider a message by [facility] may be a faster and more efficient way to get a response.  Please allow 48 business hours for a response.  Please remember that this is for non-urgent requests.   Thank you for choosing me and [facility].  Pricilla Riffle. Dagoberto Ligas., MD., Marval Regal

## 2021-11-12 DIAGNOSIS — R972 Elevated prostate specific antigen [PSA]: Secondary | ICD-10-CM | POA: Diagnosis not present

## 2021-11-12 DIAGNOSIS — D400 Neoplasm of uncertain behavior of prostate: Secondary | ICD-10-CM | POA: Diagnosis not present

## 2021-11-26 ENCOUNTER — Ambulatory Visit (INDEPENDENT_AMBULATORY_CARE_PROVIDER_SITE_OTHER): Payer: PPO | Admitting: Pharmacist

## 2021-11-26 ENCOUNTER — Other Ambulatory Visit: Payer: Self-pay

## 2021-11-26 DIAGNOSIS — E1165 Type 2 diabetes mellitus with hyperglycemia: Secondary | ICD-10-CM

## 2021-11-26 DIAGNOSIS — E785 Hyperlipidemia, unspecified: Secondary | ICD-10-CM

## 2021-11-26 DIAGNOSIS — Z794 Long term (current) use of insulin: Secondary | ICD-10-CM

## 2021-11-26 DIAGNOSIS — I152 Hypertension secondary to endocrine disorders: Secondary | ICD-10-CM

## 2021-11-26 MED ORDER — TRESIBA FLEXTOUCH 200 UNIT/ML ~~LOC~~ SOPN: 100.0000 [IU] | PEN_INJECTOR | Freq: Every day | SUBCUTANEOUS | 1 refills | Status: DC

## 2021-11-26 MED ORDER — METFORMIN HCL ER 500 MG PO TB24: 500.0000 mg | ORAL_TABLET | Freq: Two times a day (BID) | ORAL | 1 refills | Status: DC

## 2021-11-26 MED ORDER — OZEMPIC (0.25 OR 0.5 MG/DOSE) 2 MG/1.5ML ~~LOC~~ SOPN: 0.5000 mg | PEN_INJECTOR | SUBCUTANEOUS | 1 refills | Status: DC

## 2021-11-26 NOTE — Chronic Care Management (AMB) (Signed)
Chronic Care Management CCM Pharmacy Note  11/26/2021 Name:  [name] MRN:  [id number] DOB:  [birth year]  Summary: - Patient interested in restarting Ozempic.  - Discussed restarting metformin. Patient amenable.  - Discussed transitioning to basal insulin vs mixed, given meal patterns  Recommendations/Changes made from today's visit: - Stop 70/30. Start Tresiba U200 100 units daily, restart Ozempic at 0.25 mg weekly, restart metformin XR 500 mg twice daily  Subjective: [name] is an 76 y.o. year old male who is a primary patient of McLean-Scocuzza, Nino Glow, MD.  The CCM team was consulted for assistance with disease management and care coordination needs.    Engaged with patient face to face for follow up visit for pharmacy case management and/or care coordination services.   Objective:  Medications Reviewed Today     Reviewed by De Hollingshead, RPH-CPP (Pharmacist) on 11/26/21 at 1004  Med List Status: <None>   Medication Order Taking? Sig Documenting Provider Last Dose Status Informant  albuterol (VENTOLIN HFA) 108 (90 Base) MCG/ACT inhaler [id number] Yes Inhale 1-2 puffs into the lungs every 6 (six) hours as needed for wheezing or shortness of breath. McLean-Scocuzza, Nino Glow, MD Taking Active Self  Ascorbic Acid (VITAMIN C) 1000 MG tablet [id number] Yes Take 500 mg by mouth daily. [provider] Taking Active Self  aspirin 81 MG tablet [id number] Yes Take 81 mg by mouth daily. [provider] Taking Active Self  atorvastatin (LIPITOR) 20 MG tablet [id number] Yes Take 1 tablet (20 mg total) by mouth daily at 6 PM. McLean-Scocuzza, Nino Glow, MD Taking Active Self  calcium carbonate (OSCAL) 1500 (600 Ca) MG TABS tablet [id number] Yes Take 600 mg of elemental calcium by mouth daily with breakfast. [provider] Taking Active Self  Cholecalciferol (VITAMIN D-3) 125 MCG (5000 UT) TABS [id number] Yes Take 5,000 Units by mouth daily. [provider] Taking Active Self  Continuous Blood Gluc Receiver (FREESTYLE LIBRE 2 READER) [place] [id number] Yes Use to check glucose at least TID McLean-Scocuzza, Nino Glow, MD Taking Active Self  Continuous Blood Gluc Sensor (FREESTYLE LIBRE 2 SENSOR) [place] [id number] Yes Use to check glucose at least three times daily McLean-Scocuzza, Nino Glow, MD Taking Active Self  cyanocobalamin ((VITAMIN B-12)) injection 1,000 mcg [id number]   McLean-Scocuzza, Nino Glow, MD  Active   diclofenac Sodium (VOLTAREN) 1 % GEL [id number] No Apply 2 g topically 4 (four) times daily. Prn  Patient not taking: Reported on 11/26/2021   McLean-Scocuzza, Nino Glow, MD Not Taking Active Self  Fluocinolone Acetonide 0.01 % OIL [id number] Yes Place 5 drops in ear(s) 2 (two) times daily as needed. X1- 2 weeks  Patient taking differently: Place 5 drops in ear(s) as needed.   McLean-Scocuzza, Nino Glow, MD Taking Active Self  fluticasone (FLONASE) 50 MCG/ACT nasal spray [id number] No Place 2 sprays into both nostrils daily.  Patient not taking: Reported on 11/26/2021   McLean-Scocuzza, Nino Glow, MD Not Taking Active Self  insulin NPH-regular Human (NOVOLIN 70/30 RELION) (70-30) 100 UNIT/ML injection [id number] Yes 140 units with breakfast and 50 units with supper  Patient taking differently: 50-140 Units See admin instructions. Take 140 units with breakfast and 50 units with supper   Leone Haven, MD Taking Active Self  Insulin Syringe-Needle U-100 (RELION INSULIN SYR 1CC/30G) 30G X 5/16" 1 ML MISC [id number] Yes 1 each by Does not apply route 2 (two) times a day. Use to inject insulin BID; E11.9 Renato Shin, MD Taking Active Self  Lancets [facility] ULTRASOFT) lancets [id number] Yes Use as instructed 2x daily Dx: 250.00 Renato Shin, MD Taking Active Self  losartan-hydrochlorothiazide [facility]) 100-12.5 MG tablet [id number] Yes Take 1 tablet by mouth daily. D/c 100-25 mg dose McLean-Scocuzza, Nino Glow, MD Taking Active Self  MAGNESIUM PO [id number]  Take 1  tablet by mouth daily. [provider]  Active Self  metoprolol succinate (TOPROL-XL) 25 MG 24 hr tablet [id number] Yes TAKE 1 TABLET BY MOUTH EVERY DAY McLean-Scocuzza, Nino Glow, MD Taking Active Self  omeprazole (PRILOSEC) 40 MG capsule [id number] Yes Take 1 capsule (40 mg total) by mouth daily. McLean-Scocuzza, Nino Glow, MD Taking Active Self  Donald Siva test strip [id number] Yes TEST TWICE A Marella Chimes, MD Taking Active Self  Potassium 99 MG TABS [id number] Yes Take 99 mg by mouth daily. [provider] Taking Active Self           Med Note Darnelle Maffucci, Maralyn Sago Sep 05, 2021  1:43 PM)    sodium chloride (OCEAN) 0.65 % SOLN nasal spray [id number] No Place 1 spray into both nostrils as needed for congestion.  Patient not taking: Reported on 11/26/2021   McLean-Scocuzza, Nino Glow, MD Not Taking Active Self  tamsulosin [facility]) 0.4 MG CAPS capsule [id number] Yes Take 0.4 mg by mouth daily. [provider] Taking Active Self  triamcinolone ointment (KENALOG) 0.1 % [id number] Yes Apply 1 application topically 2 (two) times daily as needed (Rash). Apply to rash at ears twice a day as needed for up to 2 weeks at a time. Avoid applying to face, groin, and axilla Moye, [place], MD Taking Active Self  vitamin E 400 UNIT capsule [id number] Yes Take 400 Units by mouth daily. [provider] Taking Active Self            Pertinent Labs:   Lab Results  Component Value Date   HGBA1C 6.4 10/01/2021   Lab Results  Component Value Date   CHOL 75 10/01/2021   HDL 34.00 (L) 10/01/2021   LDLCALC 24 10/01/2021   LDLDIRECT 46.0 08/31/2018   TRIG 84.0 10/01/2021   CHOLHDL 2 10/01/2021   Lab Results  Component Value Date   CREATININE 1.20 10/01/2021   BUN 16 10/01/2021   NA 140 10/01/2021   K 4.1 10/01/2021   CL 104 10/01/2021   CO2 28 10/01/2021    SDOH:  (Social Determinants of Health) assessments and interventions performed:  SDOH Interventions     Flowsheet Row Most Recent Value  SDOH Interventions   Financial Strain Interventions Intervention Not Indicated       CCM Care Plan  Review of patient past medical history, allergies, medications, health status, including review of consultants reports, laboratory and other test data, was performed as part of comprehensive evaluation and provision of chronic care management services.   Care Plan : Medication Management  Updates made by De Hollingshead, RPH-CPP since 11/26/2021 12:00 AM     Problem: Diabetes, Hypertension, Hyperlipidemia      Long-Range Goal: Disease Progression Prevention   Start Date: 05/08/2021  Recent Progress: On track  Priority: High  Note:   Current Barriers:  Unable to achieve control of diabetes  Suboptimal therapeutic regimen for diabetes  Pharmacist Clinical Goal(s):  Over the next 90 days, patient will achieve control of diabetes as evidenced by A1c  through collaboration with PharmD and provider.   Interventions: 1:1 collaboration with McLean-Scocuzza, Nino Glow, MD regarding development and update of comprehensive plan of care  as evidenced by provider attestation and co-signature Inter-disciplinary care team collaboration (see longitudinal plan of care) Comprehensive medication review performed; medication list updated in electronic medical record  Diabetes: Uncontrolled and suboptimally managed; current treatment: Novolin OTC 70/30 150 units QAM, 50 units QPM, Ozempic 0.25 mg weekly - has been off since he last saw PCP Reports no change in diarrhea since he stopped Ozempic. Previously saw GI, they are planning colonoscopy. Patient is interested in restarting Ozempic  Hx metformin: stopped by Dr. Loanne Drilling in 2014, documented reason to simplify regimen Hx pioglitazone: stopped by Dr. Loanne Drilling in 2014, documented reason to simplify regimen Hx glimepiride: stopped by Dr. Loanne Drilling in 2014, documented reason to simplify regimen Current meal patterns:  snacking more often lately; breakfast: coffee; lunch: sometimes skips; supper: biggest meal - meat, vegetables;  Current CGM readings: using Libre 2 CGM Date of Download: 12/21-11/27/21 % Time CGM is active: 80% Average Glucose: 180 mg/dL Glucose Management Indicator: 7.6  Glucose Variability: 36.6 (goal <36%) Time in Goal:  - Time in range 70-180: 50% - Time above range: 46% - Time below range: 4% Observed patterns: high throughout the day; only post prandial spike is after supper Discussed that mixed regimen does not match his current meal patterns. Patient interested in adjustment at this time. We also discussed resuming metformin therapy, as it was previously discontinued to simplify regimen due to patient non compliance.  Discussed patient assistance. He is over income for assistance while his wife is still working.  Restart metformin XR 500 mg twice daily with meals. Counseled on risk of worsening diarrhea within the first week. Restart Ozempic at 0.25 mg weekly, can increase to 0.5 mg weekly after 4 weeks if tolerating. Currently on 200 total units of insulin, 70% is 140 units, 20% dose reduction due to increased efficacy of basal insulin results in a starting dose around ~100-115 units. Start Tresiba U200 100 units once daily. Counseled on differences between mixed insulin and basal insulins. Moving forward, will consider addition of bolus insulin with supper, but hopefully addition of GLP1 and metformin will also provide benefit in post prandial glucose readings.  Patient counseled to contact me with any fasting readings >250 with this transition to allow for titration.   Hypertension: Controlled but fluctuant; current treatment: amlodipine 2.5 mg QPM, metoprolol succinate 25 mg QPM, losartan/HCTZ 100/12.5 mg QAM Previously recommended to continue current regimen.   Hyperlipidemia and ASCVD risk reduction: Controlled per last lipid panel; current treatment: atorvastatin 20 mg daily;   Antiplatelet regimen: aspirin 81 mg daily  Previously recommended to continue current regimen at this time  GERD: Controlled per patient report; current regimen: omeprazole 20 mg daily Previously recommended to continue current regimen at this time  BPH: Controlled per patient report; current regimen: tamsulosin 0.4 mg daily Previously recommended to continue current regimen at this time  Health Maintenance: Due for shingrix vaccination. Will discuss moving forward   Patient Goals/Self-Care Activities Over the next 90 days, patient will:  - take medications as prescribed check glucose three times daily using CGM, document, and provide at future appointments check blood pressure daily, document, and provide at future appointments collaborate with provider on medication access solutions      Plan: Face to Face appointment with care management team member scheduled for: 4 weeks  Catie Darnelle Maffucci, PharmD, [place], [place] Pharmacist [employer] at [employer] [phone]

## 2021-11-26 NOTE — Patient Instructions (Addendum)
Kirill,   Stop insulin 70/30.   Start Tyler Aas U200 (long acting insulin) 100 units daily. Restart Ozempic 0.25 mg weekly. If, after 4 weeks, you are not having stomach upset or nausea, increase to 0.5 mg weekly. Start metformin XR 500 mg twice daily. Take this medication with food.   Continue to scan your [place] at least three times daily. Please call me if you see fasting readings consistently greater than 250 after about a week.   We recommend the Shingrix (shingles) vaccine series for all over age 22. It should have a $0 copay on all Medicare plans this year. You can pursue this without a prescription at your local pharmacy, or feel free to call our [place] at [facility] at [phone].  We recommend you get the updated bivalent COVID-19 booster, at least 2 months after any prior doses. You may consider delaying a booster dose by 3 months from a prior episode of COVID-19 per the CDC. Our [facility] Outpatient pharmacies can also give this vaccine.    Take care!  Catie Darnelle Maffucci, PharmD  Visit Information  Following are the goals we discussed today:  Patient Goals/Self-Care Activities Over the next 90 days, patient will:  - take medications as prescribed check glucose three times daily using CGM, document, and provide at future appointments check blood pressure daily, document, and provide at future appointments collaborate with provider on medication access solutions        Plan: Face to Face appointment with care management team member scheduled for: 4 weeks   Catie Darnelle Maffucci, PharmD, [place], CPP Clinical Pharmacist [place] at [facility] [phone]     Please call the care guide team at [phone] if you need to cancel or reschedule your appointment.   Patient verbalizes understanding of instructions provided today and agrees to view in [place].

## 2021-11-29 ENCOUNTER — Other Ambulatory Visit: Payer: Self-pay

## 2021-11-29 ENCOUNTER — Encounter
Admission: RE | Admit: 2021-11-29 | Discharge: 2021-11-29 | Disposition: A | Discharge status: Home / Self Care | Payer: PPO | Source: Ambulatory Visit | Attending: Urology | Admitting: Urology

## 2021-11-29 ENCOUNTER — Telehealth: Payer: Self-pay | Admitting: Internal Medicine

## 2021-11-29 MED ORDER — PEN NEEDLES 32G X 4 MM MISC: 1.0000 | Freq: Four times a day (QID) | 1 refills | Status: DC

## 2021-11-29 NOTE — Telephone Encounter (Signed)
Pt called in stating that Catie wrote a prescription for Tresiba insulin pen but he needs the prescription for the needles that go along with it.

## 2021-11-29 NOTE — Telephone Encounter (Signed)
Pen needles sent. Patient notified.

## 2021-11-29 NOTE — Patient Instructions (Signed)
Your procedure is scheduled on: Thurs 1/12 Report to Day Surgery. Stop by registration desk first To find out your arrival time please call [phone] between 1PM - 3PM on Wed. 1/11.  Remember: Instructions that are not followed completely may result in serious medical risk,  up to and including death, or upon the discretion of your surgeon and anesthesiologist your  surgery may need to be rescheduled.     _X__ 1. Do not eat food after midnight the night before your procedure.                 No chewing gum or hard candies. You may drink clear liquids up to 2 hours                 before you are scheduled to arrive for your surgery- DO not drink clear                 liquids within 2 hours of the start of your surgery.                 Clear Liquids include:  water,  Black Coffee or Tea (Do not add                 anything to coffee or tea). ___X__2.  On the morning of surgery brush your teeth with toothpaste and water, you                may rinse your mouth with mouthwash if you wish.  Do not swallow any toothpaste of mouthwash.     _X__ 3.  No Alcohol for 24 hours before or after surgery.   __ 4.  Do Not Smoke or use e-cigarettes For 24 Hours Prior to Your Surgery.                 Do not use any chewable tobacco products for at least 6 hours prior to                 Surgery.  ___  5.  Do not use any recreational drugs (marijuana, cocaine, heroin, ecstasy, MDMA or other) For at least one week prior to your surgery.  Combination of these drugs with anesthesia may have life threatening results.  ____  6.  Bring all medications with you on the day of surgery if instructed.   ___x_  7.  Notify your doctor if there is any change in your medical condition      (cold, fever, infections).     Do not wear jewelry,  Do not wear lotions,  You may wear deodorant. Do not shave 48 hours prior to surgery. Men may shave face and neck. Do not bring valuables to the  hospital.    [facility] is not responsible for any belongings or valuables.  Contacts, dentures or bridgework may not be worn into surgery. Leave your suitcase in the car. After surgery it may be brought to your room. For patients admitted to the hospital, discharge time is determined by your treatment team.   Patients discharged the day of surgery will not be allowed to drive home.   Make arrangements for someone to be with you for the first 24 hours of your Same Day Discharge.    Please read over the following fact sheets that you were given:       ___x_ Take these medicines the morning of surgery with A SIP OF WATER:    1. omeprazole (PRILOSEC)  40 MG capsule take night before and morning of surgery  2. tamsulosin (FLOMAX) 0.4 MG CAPS capsule  3.   4.  5.  6.  _x___ Fleet Enema (as directed) Use AM of surgery.  Shower after BM  __x__  Shower the night before and the morning of surgery after your enema as directed  ____ Use Benzoyl Peroxide Gel as instructed  __x__ Use inhalers on the day of surgery albuterol (VENTOLIN HFA) 108 (90 Base) MCG/ACT inhaler and bring with youto the hospital  ___x_ Stop metformin 2 days prior to surgery Last dose on  1/9   __x__ Take 1/2 of usual insulin dose the night before surgery. No insulin the morning          of surgery.   ___x_ Stop aspirin  today   __x__ Stop Anti-inflammatories today   May take tylenol   __x__ Stop supplements until after surgery.  Ascorbic Acid (VITAMIN C) 1000 MG tablet, calcium carbonate (OSCAL) 1500 (600 Ca) MG TABS,  tabletCholecalciferol (VITAMIN D-3) 125 MCG (5000 UT) TABS, MAGNESIUM PO, vitamin E 400 UNIT capsule  ____ Bring C-Pap to the hospital.    If you have any questions regarding your pre-procedure instructions,  Please call Pre-admit Testing at [phone]

## 2021-11-29 NOTE — Pre-Procedure Instructions (Signed)
Patient has not started his "New Diabetic regime"  He has not started the metformin or [place].  He is continuing his 70/30 until he runs out.  He is not taking his Ozempic either. He was instructed to stop the Metformin on 1/9 (if he starts taking it) and to take 1/2 of his 70/30 insulin the night before and none the morning of surgery.

## 2021-12-02 ENCOUNTER — Ambulatory Visit (INDEPENDENT_AMBULATORY_CARE_PROVIDER_SITE_OTHER): Payer: PPO

## 2021-12-02 VITALS — Ht 72.0 in | Wt 217.0 lb

## 2021-12-02 DIAGNOSIS — Z0000 Encounter for general adult medical examination without abnormal findings: Secondary | ICD-10-CM

## 2021-12-02 NOTE — Progress Notes (Signed)
Subjective:   [name] is a 76 y.o. male who presents for Medicare Annual/Subsequent preventive examination.  Review of Systems    No ROS.  Medicare Wellness Virtual Visit.  Visual/audio telehealth visit, UTA vital signs.   See social history for additional risk factors.   Cardiac Risk Factors include: advanced age (>19men, >35 women);male gender     Objective:    Today's Vitals   12/02/21 1532  Weight: 217 lb (98.4 kg)  Height: 6' (1.829 m)   Body mass index is 29.43 kg/m.  Advanced Directives 12/02/2021 11/29/2021 03/13/2021 09/04/2020 09/02/2019 08/31/2018 01/23/2017  Does Patient Have a Medical Advance Directive? No No No No No No No  Would patient like information on creating a medical advance directive? No - Patient declined No - Patient declined - No - Patient declined No - Patient declined - -    Current Medications (verified) Outpatient Encounter Medications as of 12/02/2021  Medication Sig   albuterol (VENTOLIN HFA) 108 (90 Base) MCG/ACT inhaler Inhale 1-2 puffs into the lungs every 6 (six) hours as needed for wheezing or shortness of breath.   Ascorbic Acid (VITAMIN C) 1000 MG tablet Take 500 mg by mouth daily.   aspirin 81 MG tablet Take 81 mg by mouth daily.   atorvastatin (LIPITOR) 20 MG tablet Take 1 tablet (20 mg total) by mouth daily at 6 PM.   calcium carbonate (OSCAL) 1500 (600 Ca) MG TABS tablet Take 600 mg of elemental calcium by mouth daily with breakfast.   Cholecalciferol (VITAMIN D-3) 125 MCG (5000 UT) TABS Take 5,000 Units by mouth daily.   Continuous Blood Gluc Receiver (FREESTYLE LIBRE 2 READER) DEVI Use to check glucose at least TID   Continuous Blood Gluc Sensor (FREESTYLE LIBRE 2 SENSOR) MISC Use to check glucose at least three times daily   diclofenac Sodium (VOLTAREN) 1 % GEL Apply 2 g topically 4 (four) times daily. Prn (Patient not taking: Reported on 11/26/2021)   Fluocinolone Acetonide 0.01 % OIL Place 5 drops in ear(s) 2 (two) times daily as  needed. X1- 2 weeks (Patient taking differently: Place 5 drops in ear(s) as needed.)   fluticasone (FLONASE) 50 MCG/ACT nasal spray Place 2 sprays into both nostrils daily. (Patient not taking: Reported on 11/26/2021)   insulin degludec (TRESIBA FLEXTOUCH) 200 UNIT/ML FlexTouch Pen Inject 100 Units into the skin daily. Titrate as instructed. Max daily dose 160 units. (Patient not taking: Reported on 11/29/2021)   Insulin Pen Needle (PEN NEEDLES) 32G X 4 MM MISC Inject 1 each into the skin 4 (four) times daily.   Insulin Syringe-Needle U-100 (RELION INSULIN SYR 1CC/30G) 30G X 5/16" 1 ML MISC 1 each by Does not apply route 2 (two) times a day. Use to inject insulin BID; E11.9   Lancets (ONETOUCH ULTRASOFT) lancets Use as instructed 2x daily Dx: 250.00   losartan-hydrochlorothiazide (HYZAAR) 100-12.5 MG tablet Take 1 tablet by mouth daily. D/c 100-25 mg dose   MAGNESIUM PO Take 1 tablet by mouth daily.   metFORMIN (GLUCOPHAGE XR) 500 MG 24 hr tablet Take 1 tablet (500 mg total) by mouth 2 (two) times daily. (Patient not taking: Reported on 11/29/2021)   metoprolol succinate (TOPROL-XL) 25 MG 24 hr tablet TAKE 1 TABLET BY MOUTH EVERY DAY   omeprazole (PRILOSEC) 40 MG capsule Take 1 capsule (40 mg total) by mouth daily.   ONETOUCH ULTRA test strip TEST TWICE A DAY   Potassium 99 MG TABS Take 99 mg by mouth daily.  Semaglutide,0.25 or 0.5MG /DOS, (OZEMPIC, 0.25 OR 0.5 MG/DOSE,) 2 MG/1.5ML SOPN Inject 0.5 mg into the skin once a week. (Patient not taking: Reported on 11/29/2021)   sodium chloride (OCEAN) 0.65 % SOLN nasal spray Place 1 spray into both nostrils as needed for congestion. (Patient not taking: Reported on 11/26/2021)   tamsulosin (FLOMAX) 0.4 MG CAPS capsule Take 0.4 mg by mouth daily.   triamcinolone ointment (KENALOG) 0.1 % Apply 1 application topically 2 (two) times daily as needed (Rash). Apply to rash at ears twice a day as needed for up to 2 weeks at a time. Avoid applying to face, groin, and  axilla   vitamin E 400 UNIT capsule Take 400 Units by mouth daily.   Facility-Administered Encounter Medications as of 12/02/2021  Medication   cyanocobalamin ((VITAMIN B-12)) injection 1,000 mcg    Allergies (verified) Sulfamethoxazole-trimethoprim   History: 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 09/29/2008   Diabetes mellitus without complication ([place])    x 09-81 years as of 11/08/18    DIABETIC RETINOPATHY, BACKGROUND 09/29/2008   DYSPHAGIA UNSPECIFIED 09/29/2008   Dysplastic nevus 02/20/2021   L post thigh - mild    ESOPHAGEAL STRICTURE 12/28/2008   GERD 11/28/2008   History of kidney stones    HYPERLIPIDEMIA 08/03/2007   OSTEOARTHRITIS, 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   PAIN IN SOFT TISSUES OF LIMB 01/07/2008   UNSPECIFIED ANEMIA 12/29/2007   Unspecified essential hypertension 01/07/2008   URINARY CALCULUS 09/29/2008   Urine incontinence    Past Surgical History:  Procedure Laterality Date   CHOLECYSTECTOMY N/A 11/12/2016   Procedure: LAPAROSCOPIC CHOLECYSTECTOMY WITH INTRAOPERATIVE CHOLANGIOGRAM;  Surgeon: Clayburn Pert, MD;  Location: [facility] ORS;  Service: General;  Laterality: N/A;   COLONOSCOPY     polyp   dupyretens contracture     right hand surgery,left hand    HAND SURGERY Right    2016   LITHOTRIPSY     SHOULDER SURGERY Left    left, cyst and tumor removed   URINARY SURGERY  2018   Dr Daiva Eves "stretched" urethra around prostate to increase urine flow   Family History  Problem Relation Age of Onset   Diabetes Mother    Depression Mother    Prostate cancer Brother    Diabetes Brother    Heart disease Brother    Diabetes Brother    Colon cancer Neg Hx    Esophageal cancer  Neg Hx    Rectal cancer Neg Hx    Stomach cancer Neg Hx    Social History   Socioeconomic History   Marital status: Married    Spouse name: Not on file   Number of children: 2   Years of education: Not on file   Highest education level: Not on file  Occupational History   Occupation: Metal Fabrication    Employer: [employer]  Tobacco Use   Smoking status: Never   Smokeless tobacco: Never  Vaping Use   Vaping Use: Never used  Substance and Sexual Activity   Alcohol use: Yes    Comment: occasional beer   Drug use: No   Sexual activity: Not on file  Other Topics Concern   Not on file  Social History Narrative   Daily Caffeine Use 1-2 daily   Married    Never smoker    Wears seat belt, safe in relationship    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, retired    Investment banker, operational of Radio broadcast assistant Strain: Low Risk    Difficulty of Paying Living Expenses: Not hard at all  Food Insecurity: No Food Insecurity   Worried About Charity fundraiser in the Last Year: Never true   Arboriculturist in the Last Year: Never true  Transportation Needs: No Transportation Needs   Lack of Transportation (Medical): No   Lack of Transportation (Non-Medical): No  Physical Activity: Insufficiently Active   Days of Exercise per Week: 1 day   Minutes of Exercise per Session: 60 min  Stress: No Stress Concern Present   Feeling of Stress : Not at all  Social Connections: Unknown   Frequency of Communication with Friends and Family: Not on file   Frequency of Social Gatherings with Friends and Family: Not on file   Attends Religious Services: Not on Electrical engineer or Organizations: Not on file   Attends Archivist Meetings: Not on file   Marital Status: Married    Tobacco Counseling Counseling given: Not Answered   Clinical Intake:  Pre-visit preparation completed: Yes        Diabetes: Yes (Followed by PCP and Chronic Care Management)  How often do you need to have  someone help you when you read instructions, pamphlets, or other written materials from your doctor or pharmacy?: 1 - Never Nutrition Risk Assessment: Does the patient have any non-healing wounds?  No   Financial Strains and Diabetes Management: Is the patient seen by Chronic Care Management for management of their diabetes?  Yes   Would the patient like to be referred to a Nutritionist or for Diabetic Management?  No   Interpreter Needed?: No      Activities of Daily Living In your present state of health, do you have any difficulty performing the following activities: 12/02/2021 11/29/2021  Hearing? Y [place]? N N  Difficulty concentrating or making decisions? N N  Walking or climbing stairs? N N  Dressing or bathing? N N  Doing errands, shopping? N N  Preparing Food and eating ? N -  Using the Toilet? N -  In the past six months, have you accidently leaked urine? N -  Do you have problems with loss of bowel control? N -  Managing your Medications? N -  Managing your Finances? N -  Housekeeping or managing your Housekeeping? N -  Some recent data might be hidden    Patient Care Team: McLean-Scocuzza, Nino Glow, MD as PCP - General (Internal Medicine) De Hollingshead, RPH-CPP (Pharmacist)  Indicate any recent Medical Services you may have received from other than [facility] providers in the past year (date may be approximate).     Assessment:   This is a routine wellness examination for [place].  Virtual Visit via Telephone Note  I connected with  Sarajane Marek on 12/02/21 at  3:30 PM EST by telephone and verified that I am speaking with the correct person using two identifiers.  Persons participating in the virtual visit: patient/Nurse Health Advisor   I discussed the limitations, risks, security and privacy concerns of performing an evaluation and management service by telephone and the availability of in person appointments. The patient expressed understanding  and agreed to proceed.  Interactive audio and video telecommunications were attempted between this nurse and patient, however failed, due to patient having technical difficulties OR patient did not have access to video capability.  We continued and completed visit with audio only.  Some vital signs may be absent or patient reported.   Hearing/Vision screen Hearing Screening - Comments:: Difficulty hearing conversational tones. Followed by [facility]. Hearing aids- refused. Vision Screening - Comments:: Wears corrective lenses  Retinopathy reported  They have seen their ophthalmologist in the last 12 months  Dietary issues and exercise activities discussed: Current Exercise Habits: Home exercise routine Healthy diet Good fluid intake   Goals Addressed               This Visit's Progress     Patient Stated     I want to get my body and mind moving more (pt-stated)        Walk for exercising Brain engagement activities       Depression Screen PHQ 2/9 Scores 12/02/2021 10/01/2021 09/04/2020 05/24/2020 01/20/2020 09/02/2019 07/28/2019  PHQ - 2 Score 0 0 0 0 0 0 0    Fall Risk Fall Risk  12/02/2021 10/01/2021 03/29/2021 01/23/2021 09/25/2020  Falls in the past year? 0 0 0 0 0  Number falls in past yr: 0 0 0 0 0  Injury with Fall? - 0 0 0 0  Risk for fall due to : History of fall(s) - - - -  Follow up Falls evaluation completed Falls evaluation completed Falls evaluation completed Falls evaluation completed Falls evaluation completed    [place]: Home free of loose throw rugs in walkways, pet beds, electrical cords, etc? Yes  Adequate lighting in your home to reduce risk of falls? Yes   ASSISTIVE DEVICES UTILIZED TO PREVENT FALLS: Life alert? No  Use of a cane, walker or w/c? No   TIMED UP AND GO: Was the test performed? No .   Cognitive Function:     6CIT Screen 09/04/2020 09/02/2019  What Year? 0 points 0 points  What month? 0 points 0 points  What  time? 0 points 0 points  Count back from 20 - 0 points  Months in reverse - 0 points  Repeat phrase - 0 points  Total Score - 0    Immunizations Immunization History  Administered Date(s) Administered   Fluad Quad(high Dose 65+) 07/27/2019, 08/01/2020, 10/01/2021   Influenza Whole 08/24/2009, 08/25/2011   Influenza, High Dose Seasonal PF 08/21/2016, 08/25/2017, 08/31/2018   Influenza,inj,Quad PF,6+ Mos 10/29/2015   Influenza-Unspecified 10/29/2015   Moderna SARS-COV2 Booster Vaccination 11/24/2020, 03/19/2021   Moderna Sars-Covid-2 Vaccination 01/05/2020, 02/02/2020   Pneumococcal Conjugate-13 01/23/2017   Pneumococcal Polysaccharide-23 09/25/2003, 03/27/2015   Td 09/25/2003   Tdap 08/31/2018   Shingrix Completed?: No.    Education has been provided regarding the importance of this vaccine. Patient has been advised to call insurance company to determine out of pocket expense if they have not yet received this vaccine. Advised may also receive vaccine at local pharmacy or Health Dept. Verbalized acceptance and understanding.  Screening Tests Health Maintenance  Topic Date Due   COVID-19 Vaccine (3 - Moderna risk series) 12/18/2021 (Originally 04/16/2021)   Zoster Vaccines- Shingrix (1 of 2) 03/02/2022 (Originally 05/16/1965)   COLONOSCOPY (Pts 45-75yrs Insurance coverage will need to be confirmed)  01/23/2022   HEMOGLOBIN A1C  03/31/2022   FOOT EXAM  04/11/2022   OPHTHALMOLOGY EXAM  04/17/2022   TETANUS/TDAP  08/31/2028   Pneumonia Vaccine 37+ Years old  Completed   INFLUENZA VACCINE  Completed   Hepatitis C Screening  Completed   HPV VACCINES  Aged Out   Health Maintenance There are no preventive care reminders to display for this patient.  Lung Cancer Screening: (Low Dose CT Chest recommended if Age 106-80 years, 30 pack-year currently smoking OR have quit w/in 15years.) does not qualify.   Vision Screening: Recommended annual ophthalmology exams for early detection of  glaucoma and other disorders of the eye.  Dental Screening: Recommended annual dental exams for proper oral hygiene  Community Resource Referral / Chronic Care Management: CRR required this visit?  No   CCM required this visit?  No      Plan:   Keep all routine maintenance appointments.   I have personally reviewed and noted the following in the patients chart:   Medical and social history Use of alcohol, tobacco or illicit drugs  Current medications and supplements including opioid prescriptions. Patient is not currently taking opioid prescriptions. Functional ability and status Nutritional status Physical activity Advanced directives List of other physicians Hospitalizations, surgeries, and ER visits in previous 12 months Vitals Screenings to include cognitive, depression, and falls Referrals and appointments  In addition, I have reviewed and discussed with patient certain preventive protocols, quality metrics, and best practice recommendations. A written personalized care plan for preventive services as well as general preventive health recommendations were provided to patient.     Varney Biles, LPN   01/27/756

## 2021-12-02 NOTE — Patient Instructions (Addendum)
°  [name] , Thank you for taking time to come for your Medicare Wellness Visit. I appreciate your ongoing commitment to your health goals. Please review the following plan we discussed and let me know if I can assist you in the future.   These are the goals we discussed:  Goals       Patient Stated     DIET - INCREASE WATER INTAKE (pt-stated)      I need to drink 24 more ounces       I want to get my body and mind moving more (pt-stated)      Walk for exercising Brain engagement activities      Medication Monitoring (pt-stated)      Patient Goals/Self-Care Activities Over the next 90 days, patient will:  - take medications as prescribed check glucose three times daily using CGM, document, and provide at future appointments check blood pressure daily, document, and provide at future appointments collaborate with provider on medication access solutions         This is a list of the screening recommended for you and due dates:  Health Maintenance  Topic Date Due   COVID-19 Vaccine (3 - Moderna risk series) 12/18/2021*   Zoster (Shingles) Vaccine (1 of 2) 03/02/2022*   Colon Cancer Screening  01/23/2022   Hemoglobin A1C  03/31/2022   Complete foot exam   04/11/2022   Eye exam for diabetics  04/17/2022   Tetanus Vaccine  08/31/2028   Pneumonia Vaccine  Completed   Flu Shot  Completed   Hepatitis C Screening: USPSTF Recommendation to screen - Ages 18-79 yo.  Completed   HPV Vaccine  Aged Out  *Topic was postponed. The date shown is not the original due date.

## 2021-12-03 ENCOUNTER — Encounter
Admission: RE | Admit: 2021-12-03 | Discharge: 2021-12-03 | Disposition: A | Discharge status: Home / Self Care | Payer: PPO | Source: Ambulatory Visit | Attending: Urology | Admitting: Urology

## 2021-12-03 ENCOUNTER — Other Ambulatory Visit: Payer: Self-pay

## 2021-12-03 DIAGNOSIS — Z01810 Encounter for preprocedural cardiovascular examination: Secondary | ICD-10-CM | POA: Diagnosis not present

## 2021-12-03 DIAGNOSIS — Z01818 Encounter for other preprocedural examination: Secondary | ICD-10-CM | POA: Diagnosis not present

## 2021-12-03 NOTE — H&P (Signed)
[name], MCKESSON MEDICAL RECORD NO: [id number] ACCOUNT NO: [id number] DATE OF BIRTH: 09/17/1946 FACILITY: [facility] LOCATION: [facility] PHYSICIAN: Otelia Limes. Yves Dill, MD  History and Physical   DATE OF ADMISSION: 11/29/2021  Same day surgery 11/30/2021.  CHIEF COMPLAINT:  Elevated PSA and abnormal prostate gland MRI scan.  HISTORY OF PRESENT ILLNESS:  The patient is a 76 year old white male found to have elevated PSA of 4.26 ng/mL 10/01/2021.  He was evaluated with prostate MRI scan revealing a 70 mL prostate with a 1.9 PI-RAD category 5 lesion involving the left lateral  base.  The MRI report also indicated possibility of an area of 1-2 mm of capsular spread.  He comes in now for UroNav fusion biopsy of the prostate.  PAST MEDICAL HISTORY: ALLERGIES:  HE WAS ALLERGIC TO SULFA DRUGS.  CURRENT MEDICATIONS:  Albuterol, amlodipine, aspirin, atorvastatin, azelastine, calcium carbonate, vitamin B12, diclofenac, fluocinolone oil, fluticasone spray, insulin, losartan, HCTZ, metoprolol, Novolin insulin, omeprazole, Ozempic, potassium,  tamsulosin, vitamin C and vitamin E.  PAST SURGICAL HISTORY:  1.  ESWL, 2008. 2.  TUMT of the prostate, 2019. 3.  Cholecystectomy 2017. 4.  Right hand surgery 2016. 5.  Left shoulder surgery 2018.  PAST AND CURRENT MEDICAL CONDITIONS: 1.  Colon polyps. 2.  Rhinitis. 3.  Asthma. 4.  Chronic back pain. 5.  Chronic obstructive pulmonary disease. 6.  Diabetes. 7.  Diabetic retinopathy. 8.  Esophageal stricture. 9.  GERD. 10.  Hyperlipidemia. 11.  Kidney stones. 12.  Osteoarthritis. 13.  Hypertension. 14. Unspecified anemia.  REVIEW OF SYSTEMS:  The patient denies chest pain, shortness of breath, stroke or heart disease.  PHYSICAL EXAMINATION:   VITAL SIGNS:  Height 6 feet 2 inches, weight 219 pounds, BMI 30. GENERAL:  Obese white male in no acute distress. HEENT:  Sclerae clear. NECK:  No palpable masses or tenderness. LYMPHATIC:  No palpable  cervical or inguinal adenopathy. LUNGS:  Clear to auscultation. CARDIOVASCULAR:  Regular rhythm and rate without audible murmurs. ABDOMEN:  Soft, nontender abdomen.  No CVA tenderness. GENITOURINARY:  Rectal exam 40 gram smooth.  Nontender prostate. NEUROMUSCULAR EXAM: Grossly intact.  IMPRESSION:  1.  Elevated PSA. 2.  1.9, PI-RADS category 5 lesion with possible capsule involvement on MRI scan.  PLAN:  UroNav fusion, biopsy of the prostate.   PUS D: 11/29/2021 2:36:50 pm T: 11/29/2021 3:22:00 pm  JOB: [id number]/ [id number]

## 2021-12-03 NOTE — H&P (Signed)
NAMEJAYDENCE, [name] MEDICAL RECORD NO: [id number] ACCOUNT NO: [id number] DATE OF BIRTH: 06-Sep-1946 FACILITY: [facility] LOCATION: [facility] PHYSICIAN: Otelia Limes. Yves Dill, MD  History and Physical   DATE OF ADMISSION: 12/05/2021  Date of surgery 01/12.  CHIEF COMPLAINT:  Elevated PSA and abnormal prostate gland MRI scan.  HISTORY OF PRESENT ILLNESS:  The patient is a 76 year old white male who was found to have an elevated PSA of 4.26 ng/mL in November.  This was subsequently evaluated with MRI scan in December, which revealed a 70 mL prostate with a PI-RADS category 5  lesion involving the left lateral base.  He comes in now for UroNav fusion biopsy of the prostate.  PAST MEDICAL HISTORY:  ALLERGIES:  THE PATIENT WAS ALLERGIC TO SULFA DRUGS.  CURRENT MEDICATIONS:  Include albuterol, amlodipine, aspirin, atorvastatin, azelastine, calcium carbonate, vitamin B12, diclofenac, fluocinolone oil, fluticasone spray, insulin, losartan, hydrochlorothiazide, metoprolol, Novolin insulin, omeprazole,  Ozempic, potassium, tamsulosin, vitamin C, and vitamin E.  PAST SURGICAL HISTORY:    1.  Left shoulder surgery in 2002. 2.  Repair of right trigger finger in 2002. 3.  ESWL in 2008. 4.  Cystoscopy with stent placement in 2008. 5.  TUMT of the prostate in 2019.  PAST AND CURRENT MEDICAL CONDITIONS: 1.  Diabetes. 2.  Hypercholesterolemia. 3.  Hypertension. 4.  Kidney stones.  REVIEW OF SYSTEMS:  The patient denies chest pain, shortness of breath, stroke, or coronary artery disease.  SOCIAL HISTORY:  The patient denied cigarette use.  FAMILY HISTORY:  Negative for urologic disease or urological malignancy.  PHYSICAL EXAMINATION:   GENERAL:  Well-nourished white male in no acute distress.  Height was 6 feet 2 inches, weight 219 pounds, BMI 30.  Generally, obese white male in no acute distress. HEENT:  Sclerae were clear. NECK:  No palpable masses or tenderness. PULMONARY:  Lungs clear to  auscultation. ABDOMEN:  Soft, nontender.  No CVA tenderness. RECTAL:  40 g, smooth, nontender prostate. NEUROMUSCULAR:  Alert and oriented x3.  IMPRESSION: 1.  Elevated PSA. 2.  Abnormal prostate MRI scan with a PI-RADS category 5 lesion of the left lateral base.  PLAN:  UroNav fusion biopsy of the prostate.   ROH D: 12/02/2021 4:54:42 pm T: 12/02/2021 5:14:00 pm  JOB: [id number]/ [id number]

## 2021-12-04 ENCOUNTER — Other Ambulatory Visit: Payer: Self-pay

## 2021-12-04 ENCOUNTER — Ambulatory Visit (INDEPENDENT_AMBULATORY_CARE_PROVIDER_SITE_OTHER): Payer: PPO | Admitting: Internal Medicine

## 2021-12-04 ENCOUNTER — Encounter: Payer: Self-pay | Admitting: Internal Medicine

## 2021-12-04 ENCOUNTER — Telehealth: Payer: Self-pay | Admitting: Internal Medicine

## 2021-12-04 VITALS — Ht 72.0 in | Wt 217.0 lb

## 2021-12-04 DIAGNOSIS — C61 Malignant neoplasm of prostate: Secondary | ICD-10-CM | POA: Diagnosis not present

## 2021-12-04 DIAGNOSIS — J321 Chronic frontal sinusitis: Secondary | ICD-10-CM

## 2021-12-04 DIAGNOSIS — J449 Chronic obstructive pulmonary disease, unspecified: Secondary | ICD-10-CM

## 2021-12-04 DIAGNOSIS — J309 Allergic rhinitis, unspecified: Secondary | ICD-10-CM

## 2021-12-04 DIAGNOSIS — Z20822 Contact with and (suspected) exposure to covid-19: Secondary | ICD-10-CM | POA: Diagnosis not present

## 2021-12-04 LAB — POC COVID19 BINAXNOW: SARS Coronavirus 2 Ag: NEGATIVE

## 2021-12-04 NOTE — Telephone Encounter (Signed)
Patient called, he is having a procedure tomorrow at [facility]. He is having cold symptoms and [facility] wanted him to contact his provider to have a covid test.

## 2021-12-04 NOTE — Progress Notes (Signed)
Patient states he is having head congestion, runny nose, and sneezing. Clear mucus form the nose.   Patient's wife has some cold symptoms but has not been tested.

## 2021-12-04 NOTE — Telephone Encounter (Signed)
Patient scheduled to be seen virtually today 10/04/22 at 1:15 and can then have him come in to be swabbed after his appointment.

## 2021-12-04 NOTE — Progress Notes (Signed)
Telephone Note  I connected with [name]  on 12/04/21 at  1:15 PM EST by telephone application and verified that I am speaking with the correct person using two identifiers.  Location patient: [place] Location provider:work or home office Persons participating in the virtual visit: patient, provider  I discussed the limitations and requested verbal permission for telemedicine visit. The patient expressed understanding and agreed to proceed.   HPI:  Acute telemedicine visit for : Sxs started with stuffy nose, runny nose pnd, sneezing tried claritin and needs to be covid tested before procedure tomorrow  H/o copd no sob/wheezing Consider ENT he is established and may call back for f/u Dr. Kathyrn Sheriff   Prostate bx sch 12/05/21 Consider. Dr. Lawanna Kobus referral At [facility]   -Pertinent medication allergies:see list Allergies  Allergen Reactions   Sulfamethoxazole-Trimethoprim Rash   -COVID-19 vaccine status:  Immunization History  Administered Date(s) Administered   Fluad Quad(high Dose 65+) 07/27/2019, 08/01/2020, 10/01/2021   Influenza Whole 08/24/2009, 08/25/2011   Influenza, High Dose Seasonal PF 08/21/2016, 08/25/2017, 08/31/2018   Influenza,inj,Quad PF,6+ Mos 10/29/2015   Influenza-Unspecified 10/29/2015   Moderna SARS-COV2 Booster Vaccination 11/24/2020, 03/19/2021   Moderna Sars-Covid-2 Vaccination 01/05/2020, 02/02/2020   Pneumococcal Conjugate-13 01/23/2017   Pneumococcal Polysaccharide-23 09/25/2003, 03/27/2015   Td 09/25/2003   Tdap 08/31/2018     ROS: See pertinent positives and negatives per HPI.  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 09/29/2008   Diabetes mellitus without  complication ([place])    x 41-66 years as of 11/08/18    DIABETIC RETINOPATHY, BACKGROUND 09/29/2008   DYSPHAGIA UNSPECIFIED 09/29/2008   Dysplastic nevus 02/20/2021   L post thigh - mild    ESOPHAGEAL STRICTURE 12/28/2008   GERD 11/28/2008   History of kidney stones    HYPERLIPIDEMIA 08/03/2007   OSTEOARTHRITIS, 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   PAIN IN SOFT TISSUES OF LIMB 01/07/2008   Prostate cancer ([place])    UNSPECIFIED ANEMIA 12/29/2007   Unspecified essential hypertension 01/07/2008   URINARY CALCULUS 09/29/2008   Urine incontinence     Past Surgical History:  Procedure Laterality Date   CHOLECYSTECTOMY N/A 11/12/2016   Procedure: LAPAROSCOPIC CHOLECYSTECTOMY WITH INTRAOPERATIVE CHOLANGIOGRAM;  Surgeon: Clayburn Pert, MD;  Location: [facility] ORS;  Service: General;  Laterality: N/A;   COLONOSCOPY     polyp   dupyretens contracture     right hand surgery,left hand    HAND SURGERY Right    2016   LITHOTRIPSY     SHOULDER SURGERY Left    left, cyst and tumor removed   URINARY SURGERY  2018   Dr Daiva Eves "stretched" urethra around prostate to increase urine flow     Current Outpatient Medications:    albuterol (VENTOLIN HFA) 108 (90 Base) MCG/ACT inhaler, Inhale 1-2 puffs into the lungs every 6 (six) hours as needed for wheezing or shortness of breath., Disp: 18 g, Rfl: 11   Ascorbic Acid (VITAMIN C) 1000 MG tablet, Take 500 mg by mouth daily., Disp: , Rfl:    aspirin 81 MG tablet, Take 81 mg by mouth daily., Disp: , Rfl:    atorvastatin (LIPITOR) 20 MG tablet, Take 1 tablet (20 mg total) by mouth daily  at 6 PM., Disp: 90 tablet, Rfl: 3   calcium carbonate (OSCAL) 1500 (600 Ca) MG TABS tablet, Take 600 mg of elemental calcium by mouth daily with breakfast., Disp: , Rfl:    Cholecalciferol (VITAMIN D-3) 125 MCG (5000 UT) TABS, Take 5,000 Units by mouth daily., Disp: , Rfl:    Continuous Blood Gluc Receiver (FREESTYLE LIBRE 2 READER) DEVI, Use to check glucose at least TID, Disp:  1 each, Rfl: 1   Continuous Blood Gluc Sensor (FREESTYLE LIBRE 2 SENSOR) MISC, Use to check glucose at least three times daily, Disp: 2 each, Rfl: 11   diclofenac Sodium (VOLTAREN) 1 % GEL, Apply 2 g topically 4 (four) times daily. Prn, Disp: 100 g, Rfl: 11   Fluocinolone Acetonide 0.01 % OIL, Place 5 drops in ear(s) 2 (two) times daily as needed. X1- 2 weeks (Patient taking differently: Place 5 drops in ear(s) as needed.), Disp: 20 mL, Rfl: 11   fluticasone (FLONASE) 50 MCG/ACT nasal spray, Place 2 sprays into both nostrils daily., Disp: 16 g, Rfl: 11   insulin degludec (TRESIBA FLEXTOUCH) 200 UNIT/ML FlexTouch Pen, Inject 100 Units into the skin daily. Titrate as instructed. Max daily dose 160 units., Disp: 75 mL, Rfl: 1   Insulin Pen Needle (PEN NEEDLES) 32G X 4 MM MISC, Inject 1 each into the skin 4 (four) times daily., Disp: 200 each, Rfl: 1   Insulin Syringe-Needle U-100 (RELION INSULIN SYR 1CC/30G) 30G X 5/16" 1 ML MISC, 1 each by Does not apply route 2 (two) times a day. Use to inject insulin BID; E11.9, Disp: 100 each, Rfl: 2   Lancets (ONETOUCH ULTRASOFT) lancets, Use as instructed 2x daily Dx: 250.00, Disp: 100 each, Rfl: 5   losartan-hydrochlorothiazide (HYZAAR) 100-12.5 MG tablet, Take 1 tablet by mouth daily. D/c 100-25 mg dose, Disp: 90 tablet, Rfl: 3   MAGNESIUM PO, Take 1 tablet by mouth daily., Disp: , Rfl:    metFORMIN (GLUCOPHAGE XR) 500 MG 24 hr tablet, Take 1 tablet (500 mg total) by mouth 2 (two) times daily., Disp: 180 tablet, Rfl: 1   metoprolol succinate (TOPROL-XL) 25 MG 24 hr tablet, TAKE 1 TABLET BY MOUTH EVERY DAY, Disp: 90 tablet, Rfl: 3   omeprazole (PRILOSEC) 40 MG capsule, Take 1 capsule (40 mg total) by mouth daily., Disp: 90 capsule, Rfl: 3   ONETOUCH ULTRA test strip, TEST TWICE A DAY, Disp: 100 strip, Rfl: 9   Potassium 99 MG TABS, Take 99 mg by mouth daily., Disp: , Rfl:    Semaglutide,0.25 or 0.5MG /DOS, (OZEMPIC, 0.25 OR 0.5 MG/DOSE,) 2 MG/1.5ML SOPN, Inject  0.5 mg into the skin once a week., Disp: 4.5 mL, Rfl: 1   sodium chloride (OCEAN) 0.65 % SOLN nasal spray, Place 1 spray into both nostrils as needed for congestion., Disp: 30 mL, Rfl: 11   tamsulosin (FLOMAX) 0.4 MG CAPS capsule, Take 0.4 mg by mouth daily., Disp: , Rfl:    triamcinolone ointment (KENALOG) 0.1 %, Apply 1 application topically 2 (two) times daily as needed (Rash). Apply to rash at ears twice a day as needed for up to 2 weeks at a time. Avoid applying to face, groin, and axilla, Disp: 30 g, Rfl: 3   vitamin E 400 UNIT capsule, Take 400 Units by mouth daily., Disp: , Rfl:   Current Facility-Administered Medications:    cyanocobalamin ((VITAMIN B-12)) injection 1,000 mcg, 1,000 mcg, Intramuscular, Q30 days, McLean-Scocuzza, Nino Glow, MD, 1,000 mcg at 08/01/20 1027  EXAM:  VITALS per patient  if applicable:  GENERAL: alert, oriented, appears well and in no acute distress  PSYCH/NEURO: pleasant and cooperative, no obvious depression or anxiety, speech and thought processing grossly intact  ASSESSMENT AND PLAN:  Discussed the following assessment and plan:  Close exposure to COVID-19 virus - Plan: Novel Coronavirus, NAA ([facility]), POC COVID-19 Needs testing before bx tomorrow   Prostate cancer [facility]) noted  MRI 11/05/21 Fu Dr. Rogers Blocker  Bx sch 12/05/21  Consider Dr. Lawanna Kobus   Allergic rhinitis, unspecified seasonality, unspecified trigger Chronic obstructive pulmonary disease, unspecified COPD type stable no flare Frontal sinusitis, unspecified chronicity Consider f/u Dr. Kathyrn Sheriff allergy testing  Disc ns, claritin, flonase in the future  Consider Abx if not better and f/u ent   HM See prior visit  -we discussed possible serious and likely etiologies, options for evaluation and workup, limitations of telemedicine visit vs in person visit, treatment, treatment risks and precautions. Pt is agreeable to treatment via telemedicine at this moment.   I discussed the  assessment and treatment plan with the patient. The patient was provided an opportunity to ask questions and all were answered. The patient agreed with the plan and demonstrated an understanding of the instructions.    Time spent 10 min Delorise Jackson, MD

## 2021-12-05 ENCOUNTER — Encounter
Admission: RE | Disposition: A | Discharge status: Home / Self Care | Payer: Self-pay | Source: Home / Self Care | Attending: Urology

## 2021-12-05 ENCOUNTER — Other Ambulatory Visit: Payer: Self-pay

## 2021-12-05 ENCOUNTER — Ambulatory Visit: Payer: PPO | Admitting: Registered Nurse

## 2021-12-05 ENCOUNTER — Encounter: Payer: Self-pay | Admitting: Urology

## 2021-12-05 ENCOUNTER — Ambulatory Visit
Admission: RE | Admit: 2021-12-05 | Discharge: 2021-12-05 | Disposition: A | Discharge status: Home / Self Care | Payer: PPO | Attending: Urology | Admitting: Urology

## 2021-12-05 DIAGNOSIS — E785 Hyperlipidemia, unspecified: Secondary | ICD-10-CM | POA: Insufficient documentation

## 2021-12-05 DIAGNOSIS — I2510 Atherosclerotic heart disease of native coronary artery without angina pectoris: Secondary | ICD-10-CM | POA: Diagnosis not present

## 2021-12-05 DIAGNOSIS — K219 Gastro-esophageal reflux disease without esophagitis: Secondary | ICD-10-CM | POA: Insufficient documentation

## 2021-12-05 DIAGNOSIS — R972 Elevated prostate specific antigen [PSA]: Secondary | ICD-10-CM | POA: Diagnosis not present

## 2021-12-05 DIAGNOSIS — I10 Essential (primary) hypertension: Secondary | ICD-10-CM | POA: Insufficient documentation

## 2021-12-05 DIAGNOSIS — N4231 Prostatic intraepithelial neoplasia: Secondary | ICD-10-CM | POA: Insufficient documentation

## 2021-12-05 DIAGNOSIS — M479 Spondylosis, unspecified: Secondary | ICD-10-CM | POA: Insufficient documentation

## 2021-12-05 DIAGNOSIS — E119 Type 2 diabetes mellitus without complications: Secondary | ICD-10-CM | POA: Insufficient documentation

## 2021-12-05 DIAGNOSIS — Z794 Long term (current) use of insulin: Secondary | ICD-10-CM | POA: Insufficient documentation

## 2021-12-05 DIAGNOSIS — J449 Chronic obstructive pulmonary disease, unspecified: Secondary | ICD-10-CM | POA: Insufficient documentation

## 2021-12-05 DIAGNOSIS — R9389 Abnormal findings on diagnostic imaging of other specified body structures: Secondary | ICD-10-CM | POA: Diagnosis present

## 2021-12-05 DIAGNOSIS — U071 COVID-19: Secondary | ICD-10-CM

## 2021-12-05 HISTORY — PX: PROSTATE BIOPSY: SHX241

## 2021-12-05 LAB — SARS-COV-2, NAA 2 DAY TAT

## 2021-12-05 LAB — GLUCOSE, CAPILLARY
Glucose-Capillary: 127 mg/dL — ABNORMAL HIGH (ref 70–99)
Glucose-Capillary: 138 mg/dL — ABNORMAL HIGH (ref 70–99)

## 2021-12-05 LAB — NOVEL CORONAVIRUS, NAA: SARS-CoV-2, NAA: NOT DETECTED

## 2021-12-05 SURGERY — BIOPSY, PROSTATE
Anesthesia: General

## 2021-12-05 MED ORDER — SODIUM CHLORIDE 0.9 % IV SOLN: INTRAVENOUS | Status: DC

## 2021-12-05 MED ORDER — SUGAMMADEX SODIUM 200 MG/2ML IV SOLN
INTRAVENOUS | Status: DC | PRN
  Administered 2021-12-05: 400 mg via INTRAVENOUS

## 2021-12-05 MED ORDER — DEXAMETHASONE SODIUM PHOSPHATE 10 MG/ML IJ SOLN
INTRAMUSCULAR | Status: DC | PRN
Start: 2021-12-05 — End: 2021-12-05
  Administered 2021-12-05: 10 mg via INTRAVENOUS

## 2021-12-05 MED ORDER — CEFAZOLIN SODIUM-DEXTROSE 1-4 GM/50ML-% IV SOLN
INTRAVENOUS | Status: AC
  Administered 2021-12-05: 1 g via INTRAVENOUS
  Filled 2021-12-05: qty 50

## 2021-12-05 MED ORDER — FLEET ENEMA 7-19 GM/118ML RE ENEM: 1.0000 | ENEMA | Freq: Once | RECTAL | Status: DC

## 2021-12-05 MED ORDER — LIDOCAINE HCL (CARDIAC) PF 100 MG/5ML IV SOSY
PREFILLED_SYRINGE | INTRAVENOUS | Status: DC | PRN
  Administered 2021-12-05: 100 mg via INTRAVENOUS

## 2021-12-05 MED ORDER — DEXTROSE 5 % IV SOLN
80.0000 mg | Freq: Once | INTRAVENOUS | Status: AC
  Administered 2021-12-05: 80 mg via INTRAVENOUS
  Filled 2021-12-05: qty 2

## 2021-12-05 MED ORDER — CEFAZOLIN SODIUM-DEXTROSE 1-4 GM/50ML-% IV SOLN: 1.0000 g | Freq: Once | INTRAVENOUS | Status: AC

## 2021-12-05 MED ORDER — ONDANSETRON HCL 4 MG/2ML IJ SOLN
INTRAMUSCULAR | Status: DC | PRN
  Administered 2021-12-05: 4 mg via INTRAVENOUS

## 2021-12-05 MED ORDER — FENTANYL CITRATE (PF) 100 MCG/2ML IJ SOLN: 25.0000 ug | INTRAMUSCULAR | Status: DC | PRN

## 2021-12-05 MED ORDER — ROCURONIUM BROMIDE 100 MG/10ML IV SOLN
INTRAVENOUS | Status: DC | PRN
  Administered 2021-12-05: 50 mg via INTRAVENOUS

## 2021-12-05 MED ORDER — LEVOFLOXACIN 500 MG PO TABS: 500.0000 mg | ORAL_TABLET | Freq: Every day | ORAL | 1 refills | Status: DC

## 2021-12-05 MED ORDER — ONDANSETRON HCL 4 MG/2ML IJ SOLN: 4.0000 mg | Freq: Once | INTRAMUSCULAR | Status: DC | PRN

## 2021-12-05 MED ORDER — CHLORHEXIDINE GLUCONATE 0.12 % MT SOLN: 15.0000 mL | Freq: Once | OROMUCOSAL | Status: AC

## 2021-12-05 MED ORDER — CEFAZOLIN SODIUM-DEXTROSE 2-4 GM/100ML-% IV SOLN
INTRAVENOUS | Status: AC
  Filled 2021-12-05: qty 100

## 2021-12-05 MED ORDER — PROPOFOL 10 MG/ML IV BOLUS
INTRAVENOUS | Status: DC | PRN
  Administered 2021-12-05: 180 mg via INTRAVENOUS

## 2021-12-05 MED ORDER — FENTANYL CITRATE (PF) 100 MCG/2ML IJ SOLN
INTRAMUSCULAR | Status: AC
  Filled 2021-12-05: qty 2

## 2021-12-05 MED ORDER — ORAL CARE MOUTH RINSE: 15.0000 mL | Freq: Once | OROMUCOSAL | Status: AC

## 2021-12-05 MED ORDER — FENTANYL CITRATE (PF) 100 MCG/2ML IJ SOLN
INTRAMUSCULAR | Status: DC | PRN
  Administered 2021-12-05: 50 ug via INTRAVENOUS

## 2021-12-05 MED ORDER — CHLORHEXIDINE GLUCONATE 0.12 % MT SOLN
OROMUCOSAL | Status: AC
  Administered 2021-12-05: 15 mL via OROMUCOSAL
  Filled 2021-12-05: qty 15

## 2021-12-05 SURGICAL SUPPLY — 19 items
COVER MAYO STAND REUSABLE (DRAPES) ×2 IMPLANT
COVER TRANSDUCER ULTRASOUND (MISCELLANEOUS) IMPLANT
COVER TRANSDUCER UNLTRASOUND (MISCELLANEOUS) ×1 IMPLANT
FEE DELIVERY LASER CO2 FORTEC (MISCELLANEOUS) IMPLANT
GLOVE SURG ENC MOIS LTX SZ7 (GLOVE) ×4 IMPLANT
GUIDE NDL URONAV ULTRASND S (MISCELLANEOUS) IMPLANT
GUIDE NEEDLE URONAV ULTRASND S (MISCELLANEOUS) ×1 IMPLANT
INST BIOPSY MAXCORE 18GX25 (NEEDLE) ×2 IMPLANT
LASER CO2 FORTEC DELIVERY FEE (MISCELLANEOUS) ×2 IMPLANT
MANIFOLD NEPTUNE II (INSTRUMENTS) ×2 IMPLANT
PROBE URONAV BK 8808E 8818 HLD (MISCELLANEOUS) IMPLANT
STRAP SAFETY 5IN WIDE (MISCELLANEOUS) ×2 IMPLANT
SURGILUBE 2OZ TUBE FLIPTOP (MISCELLANEOUS) ×2 IMPLANT
TOWEL OR 17X26 4PK STRL BLUE (TOWEL DISPOSABLE) ×2 IMPLANT
URONAV BK 8808E 8818 PROBE HLD (MISCELLANEOUS) ×2
URONAV MRI FUSION TWO PATIENTS (MISCELLANEOUS) ×1 IMPLANT
URONAV ULTRASOUND (MISCELLANEOUS) ×1 IMPLANT
URONAV ULTRASOUND NDL GUIDE S (MISCELLANEOUS) ×2
WATER STERILE IRR 500ML POUR (IV SOLUTION) ×2 IMPLANT

## 2021-12-05 NOTE — H&P (Signed)
Date of Initial H&P: 12/02/21  History reviewed, patient examined, no change in status, stable for surgery.

## 2021-12-05 NOTE — Op Note (Signed)
Preoperative diagnosis: 1.  Elevated PSA (R97.2)                                           2.  PIRAD category 5 lesion of left side of prostate on MRI scan (D40.0_  Postoperative diagnosis: Same  Procedure: Uronav fusion prostate biopsy (CPT [id number], [id number])  Surgeon: Otelia Limes. Yves Dill MD  Anesthesia: General  Indications:See the history and physical. 76 year old white male (DOB 12/05/2045) who was found to have an elevated PSA of 4.26 ng/mL in November.  This was subsequently evaluated with MRI scan in December, which revealed a 70 mL prostate with a PI-RADS category 5 lesion involving the left lateral base.  He comes in now for UroNav fusion biopsy of the prostate. After informed consent the above procedure(s) were requested     Technique and findings: After adequate general anesthesia been obtained patient was placed into left lateral decubitus position and DRE was performed.  The rectal vault was clear of fecal material.  The ultrasound probe was placed and images acquired.  The ultrasound images were then fused with the MRI images.  The region of interest was identified and a total of 5 core biopsies taken here.  At this point standard 12 core systematic core biopsies were performed.  The ultrasound probe was then removed.  Blood loss was minimal.  The procedure was then terminated and patient transferred to the recovery room in stable condition.

## 2021-12-05 NOTE — Anesthesia Procedure Notes (Signed)
Procedure Name: Intubation Date/Time: 12/05/2021 10:03 AM Performed by: Johnna Acosta, CRNA Pre-anesthesia Checklist: Patient identified, Emergency Drugs available, Suction available, Patient being monitored and Timeout performed Patient Re-evaluated:Patient Re-evaluated prior to induction Oxygen Delivery Method: Circle system utilized Preoxygenation: Pre-oxygenation with 100% oxygen Induction Type: IV induction Ventilation: Mask ventilation without difficulty Laryngoscope Size: McGraph and 4 Grade View: Grade I Tube type: Oral Tube size: 7.5 mm Number of attempts: 1 Airway Equipment and Method: Stylet and Video-laryngoscopy Placement Confirmation: ETT inserted through vocal cords under direct vision, positive ETCO2 and breath sounds checked- equal and bilateral Secured at: 23 cm Tube secured with: Tape Dental Injury: Teeth and Oropharynx as per pre-operative assessment

## 2021-12-05 NOTE — Anesthesia Preprocedure Evaluation (Signed)
Anesthesia Evaluation  Patient identified by MRN, date of birth, ID band Patient awake    Reviewed: Allergy & Precautions, H&P , NPO status , Patient's Chart, lab work & pertinent test results  History of Anesthesia Complications Negative for: history of anesthetic complications  Airway Mallampati: III  TM Distance: <3 FB Neck ROM: full    Dental  (+) Poor Dentition, Chipped, Dental Advidsory Given   Pulmonary shortness of breath and with exertion, asthma , neg sleep apnea, COPD,  COPD inhaler, neg recent URI,    Pulmonary exam normal breath sounds clear to auscultation       Cardiovascular Exercise Tolerance: Good hypertension, (-) angina+ CAD  (-) Past MI, (-) Cardiac Stents and (-) DOE Normal cardiovascular exam(-) dysrhythmias (-) Valvular Problems/Murmurs Rhythm:regular Rate:Normal     Neuro/Psych negative neurological ROS  negative psych ROS   GI/Hepatic negative GI ROS, Neg liver ROS, GERD  Controlled,  Endo/Other  diabetes, Type 2, Insulin Dependent  Renal/GU Renal disease     Musculoskeletal  (+) Arthritis ,   Abdominal   Peds  Hematology negative hematology ROS (+)   Anesthesia Other Findings Past Medical History: 06/2001: Adenomatous colon polyp 08/03/2007: ALLERGIC RHINITIS 08/03/2007: BENIGN PROSTATIC HYPERTROPHY 09/29/2008: 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 09/29/2008: DIABETIC RETINOPATHY, BACKGROUND 09/29/2008: DYSPHAGIA UNSPECIFIED 12/28/2008: ESOPHAGEAL STRICTURE 11/28/2008: GERD 08/03/2007: HYPERLIPIDEMIA 09/29/2008: OSTEOARTHRITIS, LUMBAR SPINE 01/07/2008: PAIN IN SOFT TISSUES OF LIMB 12/29/2007: UNSPECIFIED ANEMIA 01/07/2008: Unspecified essential hypertension 09/29/2008: URINARY CALCULUS  Past Surgical History: No date: LITHOTRIPSY No date: SHOULDER SURGERY     Comment: left, cyst and tumor removed  BMI    Body Mass Index:  31.00 kg/m      Reproductive/Obstetrics negative OB  ROS                             Anesthesia Physical  Anesthesia Plan  ASA: 3  Anesthesia Plan: General   Post-op Pain Management:    Induction: Intravenous  PONV Risk Score and Plan: 2 and Ondansetron, Dexamethasone and Treatment may vary due to age or medical condition  Airway Management Planned: Oral ETT  Additional Equipment:   Intra-op Plan:   Post-operative Plan: Extubation in OR  Informed Consent: I have reviewed the patients History and Physical, chart, labs and discussed the procedure including the risks, benefits and alternatives for the proposed anesthesia with the patient or authorized representative who has indicated his/her understanding and acceptance.     Dental Advisory Given  Plan Discussed with: Anesthesiologist, CRNA and Surgeon  Anesthesia Plan Comments:         Anesthesia Quick Evaluation

## 2021-12-05 NOTE — Transfer of Care (Signed)
Immediate Anesthesia Transfer of Care Note  Patient: Sarajane Marek  Procedure(s) Performed: PROSTATE BIOPSY URONAV  Patient Location: PACU  Anesthesia Type:General  Level of Consciousness: sedated and drowsy  Airway & Oxygen Therapy: Patient Spontanous Breathing and Patient connected to nasal cannula oxygen  Post-op Assessment: Report given to RN and Post -op Vital signs reviewed and stable  Post vital signs: Reviewed and stable  Last Vitals:  Vitals Value Taken Time  BP 139/77 12/05/21 1037  Temp 35.7 C 12/05/21 1037  Pulse 73 12/05/21 1041  Resp 16 12/05/21 1041  SpO2 99 % 12/05/21 1041    Last Pain:  Vitals:   12/05/21 1037  TempSrc:   PainSc: Asleep         Complications: No notable events documented.

## 2021-12-05 NOTE — Discharge Instructions (Addendum)
Transrectal Ultrasound-Guided Prostate Biopsy, Care After What can I expect after the procedure? After the procedure, it is common to have: Pain and discomfort near your butt (rectum), especially while sitting. Pink-colored pee (urine). This is due to small amounts of blood in your pee. A burning feeling while peeing. Blood in your poop (stool). Bleeding from your butt. Blood in your semen. Follow these instructions at home: Medicines Take over-the-counter and prescription medicines only as told by your doctor. If you were given a sedative during your procedure, do not drive or use machines until your doctor says that it is safe. A sedative is a medicine that helps you relax. If you were prescribed an antibiotic medicine, take it as told by your doctor. Do not stop taking it even if you start to feel better. Activity A sign showing that a person should not lift anything heavy.  Return to your normal activities when your doctor says that it is safe. Ask your doctor when it is okay for you to have sex. You may have to avoid lifting. Ask your doctor how much you can safely lift. General instructions A comparison of three sample cups showing dark yellow, yellow, and pale yellow urine.  Drink enough water to keep your pee pale yellow. Watch your pee, poop, and semen for new bleeding or bleeding that gets worse. Keep all follow-up visits. Contact a doctor if: You have any of these: Blood clots in your pee or poop. Blood in your pee more than 2 weeks after the procedure. Blood in your semen more than 2 months after the procedure. New or worse bleeding in your pee, poop, or semen. Very bad belly pain. Your pee smells bad or unusual. You have trouble peeing. Your lower belly feels firm. You have problems getting an erection. You feel like you may vomit (are nauseous), or you vomit. Get help right away if: You have a fever or chills. You have bright red pee. You have very bad pain that  does not get better with medicine. You cannot pee. Summary After this procedure, it is common to have pain and discomfort near your butt, especially while sitting. You may have blood in your pee and poop. It is common to have blood in your semen. Get help right away if you have a fever or chills. This information is not intended to replace advice given to you by your health care provider. Make sure you discuss any questions you have with your health care provider. Document Revised: 05/06/2021 Document Reviewed: 05/06/2021 Elsevier Patient Education  2022 [place]   The drugs that you were given will stay in your system until tomorrow so for the next 24 hours you should not:  Drive an automobile Make any legal decisions Drink any alcoholic beverage   You may resume regular meals tomorrow.  Today it is better to start with liquids and gradually work up to solid foods.  You may eat anything you prefer, but it is better to start with liquids, then soup and crackers, and gradually work up to solid foods.   Please notify your doctor immediately if you have any unusual bleeding, trouble breathing, redness and pain at the surgery site, drainage, fever, or pain not relieved by medication.    Additional Instructions:    Please contact your physician with any problems or Same Day Surgery at [phone], Monday through Friday 6 am to 4 pm, or [place] at [facility]  number at [phone].

## 2021-12-07 NOTE — Anesthesia Postprocedure Evaluation (Signed)
Anesthesia Post Note  Patient: [name]  Procedure(s) Performed: PROSTATE BIOPSY URONAV  Patient location during evaluation: PACU Anesthesia Type: General Level of consciousness: awake and alert Pain management: pain level controlled Vital Signs Assessment: post-procedure vital signs reviewed and stable Respiratory status: spontaneous breathing, nonlabored ventilation, respiratory function stable and patient connected to nasal cannula oxygen Cardiovascular status: blood pressure returned to baseline and stable Postop Assessment: no apparent nausea or vomiting Anesthetic complications: no   No notable events documented.   Last Vitals:  Vitals:   12/05/21 1112 12/05/21 1124  BP: (!) 152/76 (!) 159/76  Pulse: 72 75  Resp: 17 16  Temp: 36.5 C   SpO2: 96% 97%    Last Pain:  Vitals:   12/06/21 0906  TempSrc:   PainSc: 0-No pain                 Martha Clan

## 2021-12-09 LAB — SURGICAL PATHOLOGY

## 2021-12-10 DIAGNOSIS — R972 Elevated prostate specific antigen [PSA]: Secondary | ICD-10-CM | POA: Diagnosis not present

## 2021-12-10 DIAGNOSIS — D400 Neoplasm of uncertain behavior of prostate: Secondary | ICD-10-CM | POA: Diagnosis not present

## 2021-12-10 DIAGNOSIS — N401 Enlarged prostate with lower urinary tract symptoms: Secondary | ICD-10-CM | POA: Diagnosis not present

## 2021-12-13 ENCOUNTER — Encounter: Payer: Self-pay | Admitting: Internal Medicine

## 2021-12-13 DIAGNOSIS — N429 Disorder of prostate, unspecified: Secondary | ICD-10-CM | POA: Insufficient documentation

## 2021-12-18 ENCOUNTER — Other Ambulatory Visit: Payer: Self-pay | Admitting: Urology

## 2021-12-18 DIAGNOSIS — U071 COVID-19: Secondary | ICD-10-CM

## 2021-12-24 DIAGNOSIS — Z794 Long term (current) use of insulin: Secondary | ICD-10-CM | POA: Diagnosis not present

## 2021-12-24 DIAGNOSIS — E785 Hyperlipidemia, unspecified: Secondary | ICD-10-CM | POA: Diagnosis not present

## 2021-12-24 DIAGNOSIS — E1159 Type 2 diabetes mellitus with other circulatory complications: Secondary | ICD-10-CM

## 2021-12-24 DIAGNOSIS — E1165 Type 2 diabetes mellitus with hyperglycemia: Secondary | ICD-10-CM | POA: Diagnosis not present

## 2021-12-24 DIAGNOSIS — I152 Hypertension secondary to endocrine disorders: Secondary | ICD-10-CM

## 2021-12-25 ENCOUNTER — Other Ambulatory Visit: Payer: Self-pay

## 2021-12-25 ENCOUNTER — Ambulatory Visit (INDEPENDENT_AMBULATORY_CARE_PROVIDER_SITE_OTHER): Payer: PPO | Admitting: Pharmacist

## 2021-12-25 VITALS — BP 110/62 | HR 72

## 2021-12-25 DIAGNOSIS — I2510 Atherosclerotic heart disease of native coronary artery without angina pectoris: Secondary | ICD-10-CM

## 2021-12-25 DIAGNOSIS — J439 Emphysema, unspecified: Secondary | ICD-10-CM

## 2021-12-25 DIAGNOSIS — E1165 Type 2 diabetes mellitus with hyperglycemia: Secondary | ICD-10-CM

## 2021-12-25 DIAGNOSIS — E1159 Type 2 diabetes mellitus with other circulatory complications: Secondary | ICD-10-CM

## 2021-12-25 DIAGNOSIS — Z794 Long term (current) use of insulin: Secondary | ICD-10-CM

## 2021-12-25 MED ORDER — TRESIBA FLEXTOUCH 200 UNIT/ML ~~LOC~~ SOPN: 90.0000 [IU] | PEN_INJECTOR | Freq: Every day | SUBCUTANEOUS | 1 refills | Status: DC

## 2021-12-25 MED ORDER — METFORMIN HCL ER 500 MG PO TB24: 1000.0000 mg | ORAL_TABLET | Freq: Two times a day (BID) | ORAL | 1 refills | Status: DC

## 2021-12-25 MED ORDER — LOSARTAN POTASSIUM 100 MG PO TABS: 100.0000 mg | ORAL_TABLET | Freq: Every day | ORAL | 1 refills | Status: DC

## 2021-12-25 NOTE — Patient Instructions (Signed)
Man,   Keep up the great work!  Let's increase metformin. Start with 1 tablet in the morning, increase to 2 in the evening. Do this for about a week. If you are tolerating from a stomach perspective, please increase to 2 in the morning and 2 in the evening.   Decrease Tresiba to 90 units once daily. Please call me if you start to see fasting readings <80.   Stop losartan/HCTZ. Start losartan 100 mg every morning. We are getting rid of the "HCTZ" part of the pill. Continue taking metoprolol in the evenings.   We recommend the Shingrix (shingles) vaccine series for all over age 74. It should have a $0 copay on all Medicare plans this year. You can pursue this without a prescription at your local pharmacy, or feel free to call our [place] at [facility] at [phone].  We recommend you get the updated bivalent COVID-19 booster, at least 2 months after any prior doses. You may consider delaying a booster dose by 3 months from a prior episode of COVID-19 per the CDC.    Take care!  Catie Darnelle Maffucci, PharmD  Visit Information  Following are the goals we discussed today:   Patient Goals/Self-Care Activities Over the next 90 days, patient will:  - take medications as prescribed check glucose three times daily using CGM, document, and provide at future appointments check blood pressure daily, document, and provide at future appointments collaborate with provider on medication access solutions        Plan: Face to Face appointment with care management team member scheduled for: 4 weeks   Catie Darnelle Maffucci, PharmD, [place], CPP Clinical Pharmacist [place] at [facility] [phone]   Print copy of patient instructions, educational materials, and care plan provided in person.

## 2021-12-25 NOTE — Chronic Care Management (AMB) (Signed)
Chronic Care Management CCM Pharmacy Note  12/25/2021 Name:  [name] MRN:  [id number] DOB:  Sep 14, 1946  Summary: - Tolerating regimen well, some post prandial elevations - AM hypotension, is self-decreasing losartan/HCTZ 100/12.5 mg by splitting in half   Recommendations/Changes made from today's visit: - Increase metformin to 1000 mg BID, decrease Tresiba to 90 units daily. Continue Ozempic at 0.5 mg weekly - Eliminate HCTZ by stopping losartan/HCTZ and starting losartan 100 mg daily - Discussed vaccines - Scheduled for fasting labs before PCP visit   Subjective: [name] is an 76 y.o. year old male who is a primary patient of McLean-Scocuzza, Nino Glow, MD.  The CCM team was consulted for assistance with disease management and care coordination needs.    Engaged with patient face to face for follow up visit for pharmacy case management and/or care coordination services.   Objective:  Medications Reviewed Today     Reviewed by De Hollingshead, RPH-CPP (Pharmacist) on 12/25/21 at 1430  Med List Status: <None>   Medication Order Taking? Sig Documenting Provider Last Dose Status Informant  albuterol (VENTOLIN HFA) 108 (90 Base) MCG/ACT inhaler [id number] Yes Inhale 1-2 puffs into the lungs every 6 (six) hours as needed for wheezing or shortness of breath. McLean-Scocuzza, Nino Glow, MD Taking Active Self  Ascorbic Acid (VITAMIN C) 1000 MG tablet [id number] Yes Take 500 mg by mouth daily. [provider] Taking Active Self  aspirin 81 MG tablet [id number] Yes Take 81 mg by mouth daily. [provider] Taking Active Self  atorvastatin (LIPITOR) 20 MG tablet [id number] Yes Take 1 tablet (20 mg total) by mouth daily at 6 PM. McLean-Scocuzza, Nino Glow, MD Taking Active Self  calcium carbonate (OSCAL) 1500 (600 Ca) MG TABS tablet [id number] Yes Take 600 mg of elemental calcium by mouth daily with breakfast. [provider] Taking Active Self  Cholecalciferol  (VITAMIN D-3) 125 MCG (5000 UT) TABS [id number] Yes Take 5,000 Units by mouth daily. [provider] Taking Active Self  Continuous Blood Gluc Receiver (FREESTYLE LIBRE 2 READER) [place]VI [id number]  Use to check glucose at least TID McLean-Scocuzza, Nino Glow, MD  Active Self  Continuous Blood Gluc Sensor (FREESTYLE LIBRE 2 SENSOR) [place] [id number]  Use to check glucose at least three times daily McLean-Scocuzza, Nino Glow, MD  Active Self  cyanocobalamin ((VITAMIN B-12)) injection 1,000 mcg [id number]   McLean-Scocuzza, Nino Glow, MD  Active   diclofenac Sodium (VOLTAREN) 1 % GEL [id number] Yes Apply 2 g topically 4 (four) times daily. Prn McLean-Scocuzza, Nino Glow, MD Taking Active Self  dutasteride (AVODART) 0.5 MG capsule [id number] Yes Take 0.5 mg by mouth daily. [provider] Taking Active   Fluocinolone Acetonide 0.01 % OIL [id number] Yes Place 5 drops in ear(s) 2 (two) times daily as needed. X1- 2 weeks  Patient taking differently: Place 5 drops in ear(s) as needed.   McLean-Scocuzza, Nino Glow, MD Taking Active Self  fluticasone (FLONASE) 50 MCG/ACT nasal spray [id number] Yes Place 2 sprays into both nostrils daily. McLean-Scocuzza, Nino Glow, MD Taking Active Self  insulin degludec (TRESIBA FLEXTOUCH) 200 UNIT/ML FlexTouch Pen [id number] Yes Inject 100 Units into the skin daily. Titrate as instructed. Max daily dose 160 units. McLean-Scocuzza, Nino Glow, MD Taking Active   Insulin Pen Needle (PEN NEEDLES) 32G X 4 MM MISC [id number] Yes Inject 1 each into the skin 4 (four) times daily. McLean-Scocuzza, Nino Glow, MD Taking Active   Lancets [facility] ULTRASOFT) lancets [id number] Yes Use as instructed  2x daily Dx: 250.00 Renato Shin, MD Taking Active Self  losartan-hydrochlorothiazide [facility]) 100-12.5 MG tablet [id number] Yes Take 1 tablet by mouth daily. D/c 100-25 mg dose McLean-Scocuzza, Nino Glow, MD Taking Active Self  MAGNESIUM PO [id number] Yes Take 1 tablet by mouth daily. [provider]  Taking Active Self  metFORMIN (GLUCOPHAGE XR) 500 MG 24 hr tablet [id number] Yes Take 1 tablet (500 mg total) by mouth 2 (two) times daily. McLean-Scocuzza, Nino Glow, MD Taking Active   metoprolol succinate (TOPROL-XL) 25 MG 24 hr tablet [id number] Yes TAKE 1 TABLET BY MOUTH EVERY DAY McLean-Scocuzza, Nino Glow, MD Taking Active Self  omeprazole (PRILOSEC) 40 MG capsule [id number] Yes Take 1 capsule (40 mg total) by mouth daily. McLean-Scocuzza, Nino Glow, MD Taking Active Self  Donald Siva test strip [id number] Yes TEST TWICE A Marella Chimes, MD Taking Active Self  Potassium 99 MG TABS [id number] Yes Take 99 mg by mouth daily. [provider] Taking Active Self           Med Note Darnelle Maffucci, Maralyn Sago Sep 05, 2021  1:43 PM)    Semaglutide,0.25 or 0.5MG /DOS, (OZEMPIC, 0.25 OR 0.5 MG/DOSE,) 2 MG/1.5ML SOPN [id number] Yes Inject 0.5 mg into the skin once a week. McLean-Scocuzza, Nino Glow, MD Taking Active   sodium chloride (OCEAN) 0.65 % SOLN nasal spray [id number] Yes Place 1 spray into both nostrils as needed for congestion. McLean-Scocuzza, Nino Glow, MD Taking Active Self  tamsulosin (FLOMAX) 0.4 MG CAPS capsule [id number] Yes Take 0.4 mg by mouth daily. [provider] Taking Active Self  triamcinolone ointment (KENALOG) 0.1 % [id number] Yes Apply 1 application topically 2 (two) times daily as needed (Rash). Apply to rash at ears twice a day as needed for up to 2 weeks at a time. Avoid applying to face, groin, and axilla Moye, [place], MD Taking Active Self  vitamin E 400 UNIT capsule [id number] Yes Take 400 Units by mouth daily. [provider] Taking Active Self            Pertinent Labs:   Lab Results  Component Value Date   HGBA1C 6.4 10/01/2021   Lab Results  Component Value Date   CHOL 75 10/01/2021   HDL 34.00 (L) 10/01/2021   LDLCALC 24 10/01/2021   LDLDIRECT 46.0 08/31/2018   TRIG 84.0 10/01/2021   CHOLHDL 2 10/01/2021   Lab Results  Component Value  Date   CREATININE 1.20 10/01/2021   BUN 16 10/01/2021   NA 140 10/01/2021   K 4.1 10/01/2021   CL 104 10/01/2021   CO2 28 10/01/2021    SDOH:  (Social Determinants of Health) assessments and interventions performed:  SDOH Interventions    Flowsheet Row Most Recent Value  SDOH Interventions   Financial Strain Interventions Intervention Not Indicated       CCM Care Plan  Review of patient past medical history, allergies, medications, health status, including review of consultants reports, laboratory and other test data, was performed as part of comprehensive evaluation and provision of chronic care management services.   Care Plan : Medication Management  Updates made by De Hollingshead, RPH-CPP since 12/25/2021 12:00 AM     Problem: Diabetes, Hypertension, Hyperlipidemia      Long-Range Goal: Disease Progression Prevention   Start Date: 05/08/2021  Recent Progress: On track  Priority: High  Note:   Current Barriers:  Unable to achieve control of diabetes  Suboptimal therapeutic regimen for diabetes  Pharmacist Clinical Goal(s):  Over the next 90 days, patient will achieve control of diabetes as evidenced by A1c  through collaboration with PharmD and provider.   Interventions: 1:1 collaboration with McLean-Scocuzza, Nino Glow, MD regarding development and update of comprehensive plan of care as evidenced by provider attestation and co-signature Inter-disciplinary care team collaboration (see longitudinal plan of care) Comprehensive medication review performed; medication list updated in electronic medical record  Health Maintenance   Yearly diabetic eye exam: up to date Yearly diabetic foot exam: up to date Urine microalbumin: up to date Yearly influenza vaccination: up to date Td/Tdap vaccination: up to date Pneumonia vaccination: up to date COVID vaccinations: due - discussed today Shingrix vaccinations: due - discussed today Colonoscopy: up to  date  Diabetes: Uncontrolled but improved; current treatment: Tresiba U200 100 units daily, Ozempic 0.5 mg weekly, metformin XR 500 mg twice daily  Hx pioglitazone: stopped by Dr. Loanne Drilling in 2014, documented reason to simplify regimen Hx glimepiride: stopped by Dr. Loanne Drilling in 2014, documented reason to simplify regimen Reports he is tolerating metformin, Ozempic, and [place] well. Denies any GI intolerability Current meal patterns: reports he has been eating cereal for breakfast most days, resulting in high elevations Current CGM readings: using Libre 2 CGM Date of Download: 1/19-12/25/21 % Time CGM is active: 93% Average Glucose: 153 mg/dL Glucose Management Indicator: 7.0  Glucose Variability: 34.2 (goal <36%) Time in Goal:  - Time in range 70-180: 73% - Time above range: 27% - Time below range: 0% Observed patterns: post breakfast elevations  Reviewed CGM readings. Discussed that attempting to maximize metformin is most cost effective option. Increase metformin to 1000 mg twice daily with slow titration. Continue Ozempic at 0.5 mg weekly and reduce Tresiba U200 to 90 units daily. Advised to contact me with any fasting hypoglycemia Consider SGLT2 moving forward given CV disease, pending cost concerns.   Hypertension: Controlled but fluctuant; current treatment: metoprolol succinate 25 mg QPM, losartan/HCTZ 100/12.5 mg QAM - reports that due to hypotensive episodes in the morning, he is cutting this pill in half sometimes.  BP: controlled today in office, he took 1/2 of losartan 100/12.5 mg this morning Stop losartan/HCTZ, start losartan 100 mg daily (eliminate HCTZ, also given urinary frequency). Continue taking losartan in the morning and continue metoprolol succinate in the evening.  Previously recommended to continue current regimen.   Hyperlipidemia and ASCVD risk reduction: Controlled per last lipid panel; current treatment: atorvastatin 20 mg daily;  Antiplatelet regimen: aspirin  81 mg daily  Previously recommended to continue current regimen at this time  GERD: Controlled per patient report; current regimen: omeprazole 20 mg daily Previously recommended to continue current regimen at this time  BPH: Controlled per patient report; current regimen: tamsulosin 0.4 mg daily Previously recommended to continue current regimen at this time  Health Maintenance: Due for shingrix vaccination. Will discuss moving forward   Patient Goals/Self-Care Activities Over the next 90 days, patient will:  - take medications as prescribed check glucose three times daily using CGM, document, and provide at future appointments check blood pressure daily, document, and provide at future appointments collaborate with provider on medication access solutions      Plan: Face to Face appointment with care management team member scheduled for: 4 weeks  Catie Darnelle Maffucci, PharmD, [place], [place] Pharmacist [employer] at [employer] [phone]

## 2022-01-20 ENCOUNTER — Other Ambulatory Visit: Payer: Self-pay

## 2022-01-20 ENCOUNTER — Ambulatory Visit: Payer: PPO | Admitting: Pharmacist

## 2022-01-20 ENCOUNTER — Telehealth: Payer: Self-pay | Admitting: Pharmacist

## 2022-01-20 VITALS — BP 124/70 | HR 72 | Wt 207.0 lb

## 2022-01-20 DIAGNOSIS — J449 Chronic obstructive pulmonary disease, unspecified: Secondary | ICD-10-CM

## 2022-01-20 DIAGNOSIS — E1165 Type 2 diabetes mellitus with hyperglycemia: Secondary | ICD-10-CM

## 2022-01-20 DIAGNOSIS — I2510 Atherosclerotic heart disease of native coronary artery without angina pectoris: Secondary | ICD-10-CM

## 2022-01-20 DIAGNOSIS — E1159 Type 2 diabetes mellitus with other circulatory complications: Secondary | ICD-10-CM

## 2022-01-20 MED ORDER — TRESIBA FLEXTOUCH 200 UNIT/ML ~~LOC~~ SOPN: 80.0000 [IU] | PEN_INJECTOR | Freq: Every day | SUBCUTANEOUS | 1 refills | Status: DC

## 2022-01-20 NOTE — Patient Instructions (Addendum)
[name],   Keep up the great work!  DECREASE Tresiba to 80 units daily. Take metformin 2 tablets (1000 mg) twice daily and Ozempic 0.5 mg weekly. If you continue to have low blood sugars (<70) please call me and we can further reduce your insulin.   Call me when you get home. Look at Time In Range -> 1) Time above range 2) Time in range and 3) Time below range.   Focus on hydrating well on the days you are outside. Keep track of how frequently you are having low blood pressures at home.   We recommend the Shingrix (shingles) vaccine series for all over age 20. It should have a $0 copay on all Medicare plans this year. You can pursue this without a prescription at your local pharmacy, or feel free to call our [place] at [facility] at [phone].  We recommend you get the updated bivalent COVID-19 booster, at least 2 months after any prior doses. You may consider delaying a booster dose by 3 months from a prior episode of COVID-19 per the CDC.   Take care!  Catie Darnelle Maffucci, PharmD  Visit Information  Following are the goals we discussed today:  Patient Goals/Self-Care Activities Over the next 90 days, patient will:  - take medications as prescribed check glucose three times daily using CGM, document, and provide at future appointments check blood pressure daily, document, and provide at future appointments collaborate with provider on medication access solutions        Plan: Face to Face appointment with care management team member scheduled for: 8 weeks   Catie Darnelle Maffucci, PharmD, [place], CPP Clinical Pharmacist [place] at [facility] [phone]     Please call the care guide team at [phone] if you need to cancel or reschedule your appointment.   Print copy of patient instructions, educational materials, and care plan provided in person.

## 2022-01-20 NOTE — Chronic Care Management (AMB) (Signed)
Chronic Care Management CCM Pharmacy Note  01/20/2022 Name:  [name] MRN:  [id number] DOB:  1946-03-02  Summary: - Some hypoglycemia. Tolerating regimen well  Recommendations/Changes made from today's visit: - Decrease Tresiba to 80 units daily  Subjective: [name] is an 76 y.o. year old male who is a primary patient of McLean-Scocuzza, Nino Glow, MD.  The CCM team was consulted for assistance with disease management and care coordination needs.    Engaged with patient face to face for follow up visit for pharmacy case management and/or care coordination services.   Objective:  Medications Reviewed Today     Reviewed by De Hollingshead, RPH-CPP (Pharmacist) on 01/20/22 at 64  Med List Status: <None>   Medication Order Taking? Sig Documenting Provider Last Dose Status Informant  albuterol (VENTOLIN HFA) 108 (90 Base) MCG/ACT inhaler [id number]  Inhale 1-2 puffs into the lungs every 6 (six) hours as needed for wheezing or shortness of breath. McLean-Scocuzza, Nino Glow, MD  Active Self  Ascorbic Acid (VITAMIN C) 1000 MG tablet [id number]  Take 500 mg by mouth daily. [provider]  Active Self  aspirin 81 MG tablet [id number]  Take 81 mg by mouth daily. [provider]  Active Self  atorvastatin (LIPITOR) 20 MG tablet [id number]  Take 1 tablet (20 mg total) by mouth daily at 6 PM. McLean-Scocuzza, Nino Glow, MD  Active Self  calcium carbonate (OSCAL) 1500 (600 Ca) MG TABS tablet [id number]  Take 600 mg of elemental calcium by mouth daily with breakfast. [provider]  Active Self  Cholecalciferol (VITAMIN D-3) 125 MCG (5000 UT) TABS [id number]  Take 5,000 Units by mouth daily. [provider]  Active Self  Continuous Blood Gluc Receiver (FREESTYLE LIBRE 2 READER) DEVI [id number]  Use to check glucose at least TID McLean-Scocuzza, Nino Glow, MD  Active Self  Continuous Blood Gluc Sensor (FREESTYLE LIBRE 2 SENSOR) [place] [id number]  Use to check glucose  at least three times daily McLean-Scocuzza, Nino Glow, MD  Active Self  cyanocobalamin ((VITAMIN B-12)) injection 1,000 mcg [id number]   McLean-Scocuzza, Nino Glow, MD  Active   diclofenac Sodium (VOLTAREN) 1 % GEL [id number]  Apply 2 g topically 4 (four) times daily. Prn McLean-Scocuzza, Nino Glow, MD  Active Self  dutasteride (AVODART) 0.5 MG capsule [id number]  Take 0.5 mg by mouth daily. [provider]  Active   Fluocinolone Acetonide 0.01 % OIL [id number]  Place 5 drops in ear(s) 2 (two) times daily as needed. X1- 2 weeks  Patient taking differently: Place 5 drops in ear(s) as needed.   McLean-Scocuzza, Nino Glow, MD  Active Self  fluticasone (FLONASE) 50 MCG/ACT nasal spray [id number]  Place 2 sprays into both nostrils daily. McLean-Scocuzza, Nino Glow, MD  Active Self  insulin degludec (TRESIBA FLEXTOUCH) 200 UNIT/ML FlexTouch Pen [id number]  Inject 90 Units into the skin daily. Titrate as instructed. Max daily dose 160 units. McLean-Scocuzza, Nino Glow, MD  Active   Insulin Pen Needle (PEN NEEDLES) 32G X 4 MM MISC [id number]  Inject 1 each into the skin 4 (four) times daily. McLean-Scocuzza, Nino Glow, MD  Active   Lancets [facility] ULTRASOFT) lancets [id number]  Use as instructed 2x daily Dx: 250.00 Renato Shin, MD  Active Self  losartan (COZAAR) 100 MG tablet [id number] Yes Take 1 tablet (100 mg total) by mouth daily. McLean-Scocuzza, Nino Glow, MD Taking Active   MAGNESIUM PO [id number]  Take 1 tablet by mouth daily. [provider]  Active Self  metFORMIN (GLUCOPHAGE XR) 500 MG 24 hr tablet [id number] Yes Take 2 tablets (1,000 mg total) by mouth 2 (two) times daily. McLean-Scocuzza, Nino Glow, MD Taking Active   metoprolol succinate (TOPROL-XL) 25 MG 24 hr tablet [id number] Yes TAKE 1 TABLET BY MOUTH EVERY DAY McLean-Scocuzza, Nino Glow, MD Taking Active Self  omeprazole (PRILOSEC) 40 MG capsule [id number]  Take 1 capsule (40 mg total) by mouth daily. McLean-Scocuzza, Nino Glow, MD  Active Self   Donald Siva test strip [id number]  TEST TWICE A Marella Chimes, MD  Active Self  Potassium 99 MG TABS [id number]  Take 99 mg by mouth daily. [provider]  Active Self           Med Note Darnelle Maffucci, Maralyn Sago Sep 05, 2021  1:43 PM)    Semaglutide,0.25 or 0.5MG /DOS, (OZEMPIC, 0.25 OR 0.5 MG/DOSE,) 2 MG/1.5ML SOPN [id number] Yes Inject 0.5 mg into the skin once a week. McLean-Scocuzza, Nino Glow, MD Taking Active   sodium chloride (OCEAN) 0.65 % SOLN nasal spray [id number]  Place 1 spray into both nostrils as needed for congestion. McLean-Scocuzza, Nino Glow, MD  Active Self  tamsulosin (FLOMAX) 0.4 MG CAPS capsule [id number]  Take 0.4 mg by mouth daily. [provider]  Active Self  triamcinolone ointment (KENALOG) 0.1 % [id number]  Apply 1 application topically 2 (two) times daily as needed (Rash). Apply to rash at ears twice a day as needed for up to 2 weeks at a time. Avoid applying to face, groin, and axilla Moye, [place], MD  Active Self  vitamin E 400 UNIT capsule [id number]  Take 400 Units by mouth daily. [provider]  Active Self            Pertinent Labs:   Lab Results  Component Value Date   HGBA1C 6.4 10/01/2021   Lab Results  Component Value Date   CHOL 75 10/01/2021   HDL 34.00 (L) 10/01/2021   LDLCALC 24 10/01/2021   LDLDIRECT 46.0 08/31/2018   TRIG 84.0 10/01/2021   CHOLHDL 2 10/01/2021   Lab Results  Component Value Date   CREATININE 1.20 10/01/2021   BUN 16 10/01/2021   NA 140 10/01/2021   K 4.1 10/01/2021   CL 104 10/01/2021   CO2 28 10/01/2021    SDOH:  (Social Determinants of Health) assessments and interventions performed:    [place]  Review of patient past medical history, allergies, medications, health status, including review of consultants reports, laboratory and other test data, was performed as part of comprehensive evaluation and provision of chronic care management services.   Care Plan : Medication  Management  Updates made by De Hollingshead, RPH-CPP since 01/20/2022 12:00 AM     Problem: Diabetes, Hypertension, Hyperlipidemia      Long-Range Goal: Disease Progression Prevention   Start Date: 05/08/2021  Recent Progress: On track  Priority: High  Note:   Current Barriers:  Unable to achieve control of diabetes  Suboptimal therapeutic regimen for diabetes  Pharmacist Clinical Goal(s):  Over the next 90 days, patient will achieve control of diabetes as evidenced by A1c  through collaboration with PharmD and provider.   Interventions: 1:1 collaboration with McLean-Scocuzza, Nino Glow, MD regarding development and update of comprehensive plan of care as evidenced by provider attestation and co-signature Inter-disciplinary care team collaboration (see longitudinal plan of care) Comprehensive medication review performed; medication list updated in electronic medical record  Health Maintenance   Yearly diabetic eye exam: up  to date Yearly diabetic foot exam: up to date Urine microalbumin: up to date Yearly influenza vaccination: up to date Td/Tdap vaccination: up to date Pneumonia vaccination: up to date COVID vaccinations: due - discussed today Shingrix vaccinations: due - discussed today Colonoscopy: up to date  Diabetes: Uncontrolled but improved; current treatment: Tresiba U200 90 units daily, Ozempic 0.5 mg weekly, metformin XR 1000 mg twice daily - though reports he had been having some low blood sugars, so he decreased back to 500 mg twice daily Hx pioglitazone: stopped by Dr. Loanne Drilling in 2014, documented reason to simplify regimen Hx glimepiride: stopped by Dr. Loanne Drilling in 2014, documented reason to simplify regimen Denies any GI intolerability Current meal patterns: reports he has been eating cereal for breakfast most days; lunch: sandwich; fruit, vegetables; supper: salads, likes pasta but is trying to cut back on portion sizes; salad twice weekly; drinks: water, zero  calorie, zero sugar  Current CGM readings: using Libre 2 CGM - he did not bring reader with him today, but recalls a 14 day average of 110. Will call back to let me know Time in Range Reviewed that insulin is causing hypoglycemia and we should further reduce that dose, rather than metformin. Reduce Tresiba to 80 units daily, increase metformin back to 1000 mg twice daily. Continue Ozempic at 0.5 mg weekly given continued weight loss.  Consider SGLT2 moving forward given CV disease, pending cost concerns.   Hypertension: Controlled but fluctuant; current treatment: metoprolol succinate 25 mg QPM, losartan 100 mg daily  Home BP readings: 110-130s/70s, though reports occasional readings with SBP <100, but reports this is if he gets dehydrated.  BP well controlled in office. Continue current regimen. Educated to focus on adequate hydration. Monitor frequency of reported episode of hypotension. He will check BP twice daily between now and next PCP visit and write down readings.   Hyperlipidemia and ASCVD risk reduction: Controlled per last lipid panel; current treatment: atorvastatin 20 mg daily;  Antiplatelet regimen: aspirin 81 mg daily  Previously recommended to continue current regimen at this time  GERD: Controlled per patient report; current regimen: omeprazole 20 mg daily Previously recommended to continue current regimen at this time  BPH: Controlled per patient report; current regimen: tamsulosin 0.4 mg daily Previously recommended to continue current regimen at this time  Health Maintenance: Due for shingrix vaccination. Will discuss moving forward   Patient Goals/Self-Care Activities Over the next 90 days, patient will:  - take medications as prescribed check glucose three times daily using CGM, document, and provide at future appointments check blood pressure daily, document, and provide at future appointments collaborate with provider on medication access solutions       Plan: Face to Face appointment with care management team member scheduled for: 8 weeks  Catie Darnelle Maffucci, PharmD, [place], [place] Pharmacist [employer] at [employer] [phone]

## 2022-01-20 NOTE — Telephone Encounter (Signed)
Time above range 9% Target 81% Time below range 10%

## 2022-01-20 NOTE — Telephone Encounter (Signed)
Noted. Further solidifies our plan today to reduce Tresiba dosing

## 2022-01-20 NOTE — Telephone Encounter (Signed)
Patient is going to call in and report some information from his [place] reader to me. He will call to let us know:   Time above range: ** Time in range: ** Time below range: **  Please take this information from him and send along to me.

## 2022-01-21 ENCOUNTER — Ambulatory Visit: Payer: PPO

## 2022-01-21 DIAGNOSIS — J449 Chronic obstructive pulmonary disease, unspecified: Secondary | ICD-10-CM

## 2022-01-21 DIAGNOSIS — E1165 Type 2 diabetes mellitus with hyperglycemia: Secondary | ICD-10-CM | POA: Diagnosis not present

## 2022-01-21 DIAGNOSIS — E1159 Type 2 diabetes mellitus with other circulatory complications: Secondary | ICD-10-CM | POA: Diagnosis not present

## 2022-01-21 DIAGNOSIS — J439 Emphysema, unspecified: Secondary | ICD-10-CM

## 2022-01-21 DIAGNOSIS — Z794 Long term (current) use of insulin: Secondary | ICD-10-CM | POA: Diagnosis not present

## 2022-01-21 DIAGNOSIS — I2510 Atherosclerotic heart disease of native coronary artery without angina pectoris: Secondary | ICD-10-CM

## 2022-01-21 DIAGNOSIS — I152 Hypertension secondary to endocrine disorders: Secondary | ICD-10-CM

## 2022-01-27 ENCOUNTER — Telehealth: Payer: Self-pay

## 2022-01-27 ENCOUNTER — Ambulatory Visit: Payer: Self-pay | Admitting: Pharmacist

## 2022-01-27 NOTE — Progress Notes (Signed)
Pt would like for you to know he will miss you. He declines scheduling with nurse at this time  ?

## 2022-01-27 NOTE — Chronic Care Management (AMB) (Signed)
?  Care Management  ? ?Note ? ?01/27/2022 ?Name: [name] MRN: [id number] DOB: 1946-07-24 ? ?[name] is a 76 y.o. year old male who is a primary care patient of McLean-Scocuzza, Nino Glow, MD and is actively engaged with the care management team. I reached out to Sarajane Marek by phone today to assist with scheduling an initial visit with the RN Case Manager ? ?Follow up plan: ?Patient declines further follow up and engagement by the care management team. Appropriate care team members and provider have been notified via electronic communication.  ? ?Noreene Larsson, RMA ?Care Guide, Embedded Care Coordination ?[facility]  ?[place], [place] [address] ?Direct Dial: [phone] ?[email or web address] ?[email or web address]: [email or web address]  ? ?

## 2022-01-27 NOTE — Chronic Care Management (AMB) (Signed)
?  Chronic Care Management  ? ?Note ? ?01/27/2022 ?Name: ABB GOBERT MRN: [id number] DOB: 02/09/1946 ? ? ? ?Closing pharmacy CCM case at this time. Closing pharmacy CCM case at this time. Will collaborate with Care Guide to outreach to schedule follow up with RN CM. Patient has clinic contact information for future questions or concerns. Patient has clinic contact information for future questions or concerns.  ? ?Catie Darnelle Maffucci, PharmD, [place], CPP ?Clinical Pharmacist ?Therapist, music at [employer] ?[phone] ? ?

## 2022-02-10 ENCOUNTER — Encounter: Payer: Self-pay | Admitting: Gastroenterology

## 2022-02-14 ENCOUNTER — Other Ambulatory Visit (INDEPENDENT_AMBULATORY_CARE_PROVIDER_SITE_OTHER): Payer: PPO

## 2022-02-14 ENCOUNTER — Other Ambulatory Visit: Payer: Self-pay

## 2022-02-14 DIAGNOSIS — I2510 Atherosclerotic heart disease of native coronary artery without angina pectoris: Secondary | ICD-10-CM

## 2022-02-14 DIAGNOSIS — E1165 Type 2 diabetes mellitus with hyperglycemia: Secondary | ICD-10-CM | POA: Diagnosis not present

## 2022-02-14 DIAGNOSIS — Z794 Long term (current) use of insulin: Secondary | ICD-10-CM | POA: Diagnosis not present

## 2022-02-14 DIAGNOSIS — I152 Hypertension secondary to endocrine disorders: Secondary | ICD-10-CM

## 2022-02-14 DIAGNOSIS — J439 Emphysema, unspecified: Secondary | ICD-10-CM

## 2022-02-14 DIAGNOSIS — E1159 Type 2 diabetes mellitus with other circulatory complications: Secondary | ICD-10-CM

## 2022-02-14 LAB — CBC WITH DIFFERENTIAL/PLATELET
Basophils Absolute: 0.1 10*3/uL (ref 0.0–0.1)
Basophils Relative: 0.8 % (ref 0.0–3.0)
Eosinophils Absolute: 1.2 10*3/uL — ABNORMAL HIGH (ref 0.0–0.7)
Eosinophils Relative: 17.8 % — ABNORMAL HIGH (ref 0.0–5.0)
HCT: 42.4 % (ref 39.0–52.0)
Hemoglobin: 14.7 g/dL (ref 13.0–17.0)
Lymphocytes Relative: 30.9 % (ref 12.0–46.0)
Lymphs Abs: 2.1 10*3/uL (ref 0.7–4.0)
MCHC: 34.8 g/dL (ref 30.0–36.0)
MCV: 96.5 fl (ref 78.0–100.0)
Monocytes Absolute: 0.6 10*3/uL (ref 0.1–1.0)
Monocytes Relative: 9.2 % (ref 3.0–12.0)
Neutro Abs: 2.9 10*3/uL (ref 1.4–7.7)
Neutrophils Relative %: 41.3 % — ABNORMAL LOW (ref 43.0–77.0)
Platelets: 175 10*3/uL (ref 150.0–400.0)
RBC: 4.39 Mil/uL (ref 4.22–5.81)
RDW: 14.2 % (ref 11.5–15.5)
WBC: 6.9 10*3/uL (ref 4.0–10.5)

## 2022-02-14 LAB — COMPREHENSIVE METABOLIC PANEL
ALT: 21 U/L (ref 0–53)
AST: 20 U/L (ref 0–37)
Albumin: 4.2 g/dL (ref 3.5–5.2)
Alkaline Phosphatase: 63 U/L (ref 39–117)
BUN: 17 mg/dL (ref 6–23)
CO2: 32 mEq/L (ref 19–32)
Calcium: 9.8 mg/dL (ref 8.4–10.5)
Chloride: 106 mEq/L (ref 96–112)
Creatinine, Ser: 1.01 mg/dL (ref 0.40–1.50)
GFR: 73.65 mL/min (ref >60.00)
Glucose, Bld: 118 mg/dL — ABNORMAL HIGH (ref 70–99)
Potassium: 4.6 mEq/L (ref 3.5–5.1)
Sodium: 143 mEq/L (ref 135–145)
Total Bilirubin: 0.9 mg/dL (ref 0.2–1.2)
Total Protein: 6.3 g/dL (ref 6.0–8.3)

## 2022-02-14 LAB — LIPID PANEL
Cholesterol: 65 mg/dL (ref 0–200)
HDL: 31 mg/dL — ABNORMAL LOW (ref >39.00)
LDL Cholesterol: 16 mg/dL (ref 0–99)
NonHDL: 34.43
Total CHOL/HDL Ratio: 2
Triglycerides: 92 mg/dL (ref 0.0–149.0)
VLDL: 18.4 mg/dL (ref 0.0–40.0)

## 2022-02-14 LAB — HEMOGLOBIN A1C: Hgb A1c MFr Bld: 6.7 % — ABNORMAL HIGH (ref 4.6–6.5)

## 2022-02-18 ENCOUNTER — Ambulatory Visit (INDEPENDENT_AMBULATORY_CARE_PROVIDER_SITE_OTHER): Payer: PPO | Admitting: Internal Medicine

## 2022-02-18 ENCOUNTER — Encounter: Payer: Self-pay | Admitting: Internal Medicine

## 2022-02-18 ENCOUNTER — Other Ambulatory Visit: Payer: Self-pay

## 2022-02-18 VITALS — BP 120/70 | HR 74 | Temp 97.6°F | Ht 72.0 in | Wt 210.4 lb

## 2022-02-18 DIAGNOSIS — E1165 Type 2 diabetes mellitus with hyperglycemia: Secondary | ICD-10-CM | POA: Diagnosis not present

## 2022-02-18 DIAGNOSIS — Z794 Long term (current) use of insulin: Secondary | ICD-10-CM | POA: Diagnosis not present

## 2022-02-18 DIAGNOSIS — E1159 Type 2 diabetes mellitus with other circulatory complications: Secondary | ICD-10-CM | POA: Diagnosis not present

## 2022-02-18 DIAGNOSIS — N4231 Prostatic intraepithelial neoplasia: Secondary | ICD-10-CM

## 2022-02-18 DIAGNOSIS — E538 Deficiency of other specified B group vitamins: Secondary | ICD-10-CM | POA: Diagnosis not present

## 2022-02-18 DIAGNOSIS — E559 Vitamin D deficiency, unspecified: Secondary | ICD-10-CM | POA: Diagnosis not present

## 2022-02-18 DIAGNOSIS — I10 Essential (primary) hypertension: Secondary | ICD-10-CM

## 2022-02-18 MED ORDER — TRESIBA FLEXTOUCH 200 UNIT/ML ~~LOC~~ SOPN: 78.0000 [IU] | PEN_INJECTOR | Freq: Every day | SUBCUTANEOUS | 1 refills | Status: DC

## 2022-02-18 MED ORDER — LOSARTAN POTASSIUM 50 MG PO TABS: 50.0000 mg | ORAL_TABLET | Freq: Two times a day (BID) | ORAL | 3 refills | Status: DC

## 2022-02-18 NOTE — Patient Instructions (Addendum)
Call and schedule colonoscopy please ? ?Dr. Fuller Plan ? ?Phone Fax E-mail Address  ?[phone] [email or web address] [address]  ? [place] [address]  ? ?Reduce tresiba to 78 units  ?Glucose tablets for sugar <70  ? ?Hypoglycemia ?Hypoglycemia occurs when the level of sugar (glucose) in the blood is too low. Hypoglycemia can happen in people who have or do not have diabetes. It can develop quickly, and it can be a medical emergency. For most people, a blood glucose level below 70 mg/dL (3.9 mmol/L) is considered hypoglycemia. ?Glucose is a type of sugar that provides the body's main source of energy. Certain hormones (insulin and glucagon) control the level of glucose in the blood. Insulin lowers blood glucose, and glucagon raises blood glucose. Hypoglycemia can result from having too much insulin in the bloodstream, or from not eating enough food that contains glucose. You may also have reactive hypoglycemia, which happens within 4 hours after eating a meal. ?What are the causes? ?Hypoglycemia occurs most often in people who have diabetes and may be caused by: ?Diabetes medicine. ?Not eating enough, or not eating often enough. ?Increased physical activity. ?Drinking alcohol on an empty stomach. ?If you do not have diabetes, hypoglycemia may be caused by: ?A tumor in the pancreas. ?Not eating enough, or not eating for long periods at a time (fasting). ?A severe infection or illness. ?Problems after having bariatric surgery. ?Organ failure, such as kidney or liver failure. ?Certain medicines. ?What increases the risk? ?Hypoglycemia is more likely to develop in people who: ?Have diabetes and take medicines to lower blood glucose. ?Abuse alcohol. ?Have a severe illness. ?What are the signs or symptoms? ?Symptoms vary depending on whether the condition is mild, moderate, or severe. ?Mild hypoglycemia ?Hunger. ?Sweating and feeling clammy. ?Dizziness or feeling light-headed. ?Sleepiness or  restless sleep. ?Nausea. ?Increased heart rate. ?Headache. ?Blurry vision. ?Mood changes, such as irritability or anxiety. ?Tingling or numbness around the mouth, lips, or tongue. ?Moderate hypoglycemia ?Confusion and poor judgment. ?Behavior changes. ?Weakness. ?Irregular heartbeat. ?A change in coordination. ?Severe hypoglycemia ?Severe hypoglycemia is a medical emergency. It can cause: ?Fainting. ?Seizures. ?Loss of consciousness (coma). ?Death. ?How is this diagnosed? ?Hypoglycemia is diagnosed with a blood test to measure your blood glucose level. This blood test is done while you are having symptoms. Your health care provider may also do a physical exam and review your medical history. ?How is this treated? ?This condition can be treated by immediately eating or drinking something that contains sugar with 15 grams of fast-acting carbohydrate, such as: ?4 oz (120 mL) of fruit juice. ?4 oz (120 mL) of regular soda (not diet soda). ?Several pieces of hard candy. Check food labels to find out how many pieces to eat for 15 grams. ?1 Tbsp (15 mL) of sugar or honey. ?4 glucose tablets. ?1 tube of glucose gel. ?Treating hypoglycemia if you have diabetes ?If you are alert and able to swallow safely, follow the 15:15 rule: ?Take 15 grams of a fast-acting carbohydrate. Talk with your health care provider about how much you should take. Options for getting 15 grams of fast-acting carbohydrate include: ?Glucose tablets (take 4 tablets). ?Several pieces of hard candy. Check food labels to find out how many pieces to eat for 15 grams. ?4 oz (120 mL) of fruit juice. ?4 oz (120 mL) of regular soda (not diet soda). ?1 Tbsp (15 mL) of sugar or honey. ?1 tube of glucose gel. ?Check your blood glucose 15 minutes after you take the  carbohydrate. ?If the repeat blood glucose level is still at or below 70 mg/dL (3.9 mmol/L), take 15 grams of a carbohydrate again. ?If your blood glucose level does not increase above 70 mg/dL (3.9  mmol/L) after 3 tries, seek emergency medical care. ?After your blood glucose level returns to normal, eat a meal or a snack within 1 hour. ? ?Treating severe hypoglycemia ?Severe hypoglycemia is when your blood glucose level is below 54 mg/dL (3 mmol/L). Severe hypoglycemia is a medical emergency. Get medical help right away. ?If you have severe hypoglycemia and you cannot eat or drink, you will need to be given glucagon. A family member or close friend should learn how to check your blood glucose and how to give you glucagon. Ask your health care provider if you need to have an emergency glucagon kit available. ?Severe hypoglycemia may need to be treated in a hospital. The treatment may include getting glucose through an IV. You may also need treatment for the cause of your hypoglycemia. ?Follow these instructions at home: ?General instructions ?Take over-the-counter and prescription medicines only as told by your health care provider. ?Monitor your blood glucose as told by your health care provider. ?If you drink alcohol: ?Limit how much you have to: ?0-1 drink a day for women who are not pregnant. ?0-2 drinks a day for men. ?Know how much alcohol is in your drink. In the [place] one drink equals one 12 oz bottle of beer (355 mL), one 5 oz glass of wine (148 mL), or one 1? oz glass of hard liquor (44 mL). ?Be sure to eat food along with drinking alcohol. ?Be aware that alcohol is absorbed quickly and may have lingering effects that may result in hypoglycemia later. Be sure to do ongoing glucose monitoring. ?Keep all follow-up visits. This is important. ?If you have diabetes: ?Always have a fast-acting carbohydrate (15 grams) option with you to treat low blood glucose. ?Follow your diabetes management plan as directed by your health care provider. Make sure you: ?Know the symptoms of hypoglycemia. It is important to treat it right away to prevent it from becoming severe. ?Check your blood glucose as often as told.  Always check before and after exercise. ?Always check your blood glucose before you drive a motorized vehicle. ?Take your medicines as told. ?Follow your meal plan. Eat on time, and do not skip meals. ?Share your diabetes management plan with people in your workplace, school, and household. ?Carry a medical alert card or wear medical alert jewelry. ?Where to find more information ?American Diabetes Association: www.diabetes.org ?Contact a health care provider if: ?You have problems keeping your blood glucose in your target range. ?You have frequent episodes of hypoglycemia. ?Get help right away if: ?You continue to have hypoglycemia symptoms after eating or drinking something that contains 15 grams of fast-acting carbohydrate, and you cannot get your blood glucose above 70 mg/dL (3.9 mmol/L) while following the 15:15 rule. ?Your blood glucose is below 54 mg/dL (3 mmol/L). ?You have a seizure. ?You faint. ?These symptoms may represent a serious problem that is an emergency. Do not wait to see if the symptoms will go away. Get medical help right away. Call your local emergency services (911 in the [place] Do not drive yourself to the hospital. ?Summary ?Hypoglycemia occurs when the level of sugar (glucose) in the blood is too low. ?Hypoglycemia can happen in people who have or do not have diabetes. It can develop quickly, and it can be a medical emergency. ?Make sure  you know the symptoms of hypoglycemia and how to treat it. ?Always have a fast-acting carbohydrate option with you to treat low blood sugar. ?This information is not intended to replace advice given to you by your health care provider. Make sure you discuss any questions you have with your health care provider. ?Document Revised: 10/11/2020 Document Reviewed: 10/11/2020 ?Elsevier Patient Education ? [place]. ? ?Hyperglycemia ?Hyperglycemia occurs when the level of sugar (glucose) in the blood is too high. Glucose is a type of sugar that provides  the body's main source of energy. Certain hormones (insulin and glucagon) control the level of glucose in the blood. Insulin lowers blood glucose, and glucagon increases blood glucose. Hyperglycemia can result

## 2022-02-18 NOTE — Progress Notes (Signed)
Chief Complaint  ?Patient presents with  ? Follow-up  ? ?F/u  ?1. 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 Rogers Blocker he will have f/u 03/29/22 to f/u PSA and monitor this condition which maybe premalignant to prostate cancer ?And BPH on Avodart per urology  ? ?2. DM2 cbgs low 10% of time and 81% of time at target and above target 9% of time he has low cbgs in the am 50s to 60s he is on tresiba 80 units, ozempic 0.5 weekly and metformin xr 1000 mg bid ?HTN  controlled at times it runs low on losartan 100 mg qd and Toprol xl 25 mg xl qd  ? ?Review of Systems  ?Constitutional:  Negative for weight loss.  ?HENT:  Negative for hearing loss.   ?Eyes:  Negative for blurred vision.  ?Respiratory:  Negative for shortness of breath.   ?Cardiovascular:  Negative for chest pain.  ?Gastrointestinal:  Negative for abdominal pain and blood in stool.  ?Musculoskeletal:  Negative for back pain.  ?Skin:  Negative for rash.  ?Neurological:  Negative for headaches.  ?Psychiatric/Behavioral:  Negative for depression.   ?Past Medical History:  ?Diagnosis Date  ? Adenomatous colon polyp 06/2001  ? ALLERGIC RHINITIS 08/03/2007  ? Allergy   ? Asthma   ? BENIGN PROSTATIC HYPERTROPHY 08/03/2007  ? Chronic back pain   ? COPD (chronic obstructive pulmonary disease) ([place])   ? Coronary artery disease   ? 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 09/29/2008  ? Diabetes mellitus without complication ([place])   ? x 30-40 years as of 11/08/18   ? DIABETIC RETINOPATHY, BACKGROUND 09/29/2008  ? DYSPHAGIA UNSPECIFIED 09/29/2008  ? Dysplastic nevus 02/20/2021  ? L post thigh - mild   ? ESOPHAGEAL STRICTURE 12/28/2008  ? GERD 11/28/2008  ? History of kidney stones   ? HYPERLIPIDEMIA 08/03/2007  ? 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 PAIN IN SOFT TISSUES OF LIMB 01/07/2008  ? Prostate cancer ([place])   ? UNSPECIFIED ANEMIA 12/29/2007  ? Unspecified essential hypertension 01/07/2008  ? URINARY CALCULUS 09/29/2008  ? Urine incontinence   ? ?Past  Surgical History:  ?Procedure Laterality Date  ? CHOLECYSTECTOMY N/A 11/12/2016  ? Procedure: LAPAROSCOPIC CHOLECYSTECTOMY WITH INTRAOPERATIVE CHOLANGIOGRAM;  Surgeon: Clayburn Pert, MD;  Location: [facility] ORS;  Service: General;  Laterality: N/A;  ? COLONOSCOPY    ? polyp  ? dupyretens contracture    ? right hand surgery,left hand   ? HAND SURGERY Right   ? 2016  ? LITHOTRIPSY    ? PROSTATE BIOPSY N/A 12/05/2021  ? Procedure: PROSTATE BIOPSY URONAV;  Surgeon: Royston Cowper, MD;  Location: [facility] ORS;  Service: Urology;  Laterality: N/A;  ? SHOULDER SURGERY Left   ? left, cyst and tumor removed  ? URINARY SURGERY  2018  ? Dr Daiva Eves "stretched" urethra around prostate to increase urine flow  ? ?Family History  ?Problem Relation Age of Onset  ? Diabetes Mother   ? Depression Mother   ? Prostate cancer Brother   ? Diabetes Brother   ? Heart disease Brother   ? Diabetes Brother   ? Colon cancer Neg Hx   ? Esophageal cancer Neg Hx   ? Rectal cancer Neg Hx   ? Stomach cancer Neg Hx   ? ?Social History  ? ?Socioeconomic History  ? Marital status: Married  ?  Spouse name: Not on file  ? Number of children: 2  ? Years of education: Not on file  ?  Highest education level: Not on file  ?Occupational History  ? Occupation: Metal Fabrication  ?  Employer: [employer]  ?Tobacco Use  ? Smoking status: Never  ? Smokeless tobacco: Never  ?Vaping Use  ? Vaping Use: Never used  ?Substance and Sexual Activity  ? Alcohol use: Yes  ?  Comment: occasional beer  ? Drug use: No  ? Sexual activity: Not on file  ?Other Topics Concern  ? Not on file  ?Social History Narrative  ? Daily Caffeine Use 1-2 daily  ? Married   ? Never smoker   ? Wears seat belt, safe in relationship   ? 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 ?Social Determinants of Health  ? ?Financial Resource Strain: Low Risk   ? Difficulty of Paying Living Expenses: Not hard at all  ?Food Insecurity: No Food Insecurity  ? Worried About Charity fundraiser in the Last Year: Never true  ? Ran  Out of Food in the Last Year: Never true  ?Transportation Needs: No Transportation Needs  ? Lack of Transportation (Medical): No  ? Lack of Transportation (Non-Medical): No  ?Physical Activity: Insufficiently Active  ? Days of Exercise per Week: 1 day  ? Minutes of Exercise per Session: 60 min  ?Stress: No Stress Concern Present  ? Feeling of Stress : Not at all  ?Social Connections: Unknown  ? Frequency of Communication with Friends and Family: Not on file  ? Frequency of Social Gatherings with Friends and Family: Not on file  ? Attends Religious Services: Not on file  ? Active Member of Clubs or Organizations: Not on file  ? Attends Archivist Meetings: Not on file  ? Marital Status: Married  ?Intimate Partner Violence: Not At Risk  ? Fear of Current or Ex-Partner: No  ? Emotionally Abused: No  ? Physically Abused: No  ? Sexually Abused: No  ? ?Current Meds  ?Medication Sig  ? albuterol (VENTOLIN HFA) 108 (90 Base) MCG/ACT inhaler Inhale 1-2 puffs into the lungs every 6 (six) hours as needed for wheezing or shortness of breath.  ? Ascorbic Acid (VITAMIN C) 1000 MG tablet Take 500 mg by mouth daily.  ? aspirin 81 MG tablet Take 81 mg by mouth daily.  ? atorvastatin (LIPITOR) 20 MG tablet Take 1 tablet (20 mg total) by mouth daily at 6 PM.  ? calcium carbonate (OSCAL) 1500 (600 Ca) MG TABS tablet Take 600 mg of elemental calcium by mouth daily with breakfast.  ? Cholecalciferol (VITAMIN D-3) 125 MCG (5000 UT) TABS Take 5,000 Units by mouth daily.  ? Continuous Blood Gluc Receiver (FREESTYLE LIBRE 2 READER) DEVI Use to check glucose at least TID  ? Continuous Blood Gluc Sensor (FREESTYLE LIBRE 2 SENSOR) MISC Use to check glucose at least three times daily  ? diclofenac Sodium (VOLTAREN) 1 % GEL Apply 2 g topically 4 (four) times daily. Prn  ? dutasteride (AVODART) 0.5 MG capsule Take 0.5 mg by mouth daily.  ? Fluocinolone Acetonide 0.01 % OIL Place 5 drops in ear(s) 2 (two) times daily as needed. X1- 2  weeks (Patient taking differently: Place 5 drops in ear(s) as needed.)  ? fluticasone (FLONASE) 50 MCG/ACT nasal spray Place 2 sprays into both nostrils daily.  ? Insulin Pen Needle (PEN NEEDLES) 32G X 4 MM MISC Inject 1 each into the skin 4 (four) times daily.  ? Lancets (ONETOUCH ULTRASOFT) lancets Use as instructed 2x daily Dx: 250.00  ? MAGNESIUM PO Take 1 tablet  by mouth daily.  ? metFORMIN (GLUCOPHAGE XR) 500 MG 24 hr tablet Take 2 tablets (1,000 mg total) by mouth 2 (two) times daily.  ? metoprolol succinate (TOPROL-XL) 25 MG 24 hr tablet TAKE 1 TABLET BY MOUTH EVERY DAY  ? omeprazole (PRILOSEC) 40 MG capsule Take 1 capsule (40 mg total) by mouth daily.  ? ONETOUCH ULTRA test strip TEST TWICE A DAY  ? Potassium 99 MG TABS Take 99 mg by mouth daily.  ? Semaglutide,0.25 or 0.'5MG'$ /DOS, (OZEMPIC, 0.25 OR 0.5 MG/DOSE,) 2 MG/1.5ML SOPN Inject 0.5 mg into the skin once a week.  ? sodium chloride (OCEAN) 0.65 % SOLN nasal spray Place 1 spray into both nostrils as needed for congestion.  ? tamsulosin (FLOMAX) 0.4 MG CAPS capsule Take 0.4 mg by mouth daily.  ? triamcinolone ointment (KENALOG) 0.1 % Apply 1 application topically 2 (two) times daily as needed (Rash). Apply to rash at ears twice a day as needed for up to 2 weeks at a time. Avoid applying to face, groin, and axilla  ? vitamin E 400 UNIT capsule Take 400 Units by mouth daily.  ? [DISCONTINUED] insulin degludec (TRESIBA FLEXTOUCH) 200 UNIT/ML FlexTouch Pen Inject 80 Units into the skin daily.  ? [DISCONTINUED] losartan (COZAAR) 100 MG tablet Take 1 tablet (100 mg total) by mouth daily.  ? ?Current Facility-Administered Medications for the 02/18/22 encounter (Office Visit) with McLean-Scocuzza, Nino Glow, MD  ?Medication  ? cyanocobalamin ((VITAMIN B-12)) injection 1,000 mcg  ? ?Allergies  ?Allergen Reactions  ? Sulfamethoxazole-Trimethoprim Rash  ? ?Recent Results (from the past 2160 hour(s))  ?Novel Coronavirus, NAA ([facility])     Status: None  ? Collection  Time: 12/04/21  2:13 PM  ? Specimen: Nasopharyngeal(NP) swabs in vial transport medium  ? Nasopharynge  Previous  ?Result Value Ref Range  ? SARS-CoV-2, NAA Not Detected Not Detected  ?  Comment: This nucleic aci

## 2022-02-19 DIAGNOSIS — N4231 Prostatic intraepithelial neoplasia: Secondary | ICD-10-CM | POA: Insufficient documentation

## 2022-02-25 ENCOUNTER — Other Ambulatory Visit: Payer: Self-pay | Admitting: Internal Medicine

## 2022-02-25 DIAGNOSIS — Z794 Long term (current) use of insulin: Secondary | ICD-10-CM

## 2022-02-27 ENCOUNTER — Ambulatory Visit: Payer: PPO | Admitting: Dermatology

## 2022-03-08 ENCOUNTER — Other Ambulatory Visit: Payer: Self-pay | Admitting: Internal Medicine

## 2022-03-12 ENCOUNTER — Encounter: Payer: Self-pay | Admitting: Gastroenterology

## 2022-03-17 ENCOUNTER — Ambulatory Visit (INDEPENDENT_AMBULATORY_CARE_PROVIDER_SITE_OTHER): Payer: PPO | Admitting: Family Medicine

## 2022-03-17 ENCOUNTER — Encounter: Payer: Self-pay | Admitting: Family Medicine

## 2022-03-17 DIAGNOSIS — J309 Allergic rhinitis, unspecified: Secondary | ICD-10-CM | POA: Diagnosis not present

## 2022-03-17 NOTE — Assessment & Plan Note (Signed)
The patient symptoms are consistent with allergies.  I encouraged him to try his Flonase 2 sprays each nostril once daily.  If this is not beneficial over the next couple of days he can add Zyrtec over-the-counter.  If that is not helpful he will call us for further treatment.  Discussed the risk of nosebleeds with the Flonase. ?

## 2022-03-17 NOTE — Progress Notes (Signed)
?Tommi Rumps, MD ?Phone: [phone] ? ?[name] is a 76 y.o. male who presents today for allergic rhinitis. ? ?Allergic rhinitis: Patient has with a history of this.  He notes a couple weeks ago he cut the grass and that set his symptoms off.  He has had rhinorrhea, sneezing, and watery eyes.  He has had some minimal cough and some postnasal drip.  He has had some congestion.  No fevers.  He has tried Claritin with no benefit.  He has not been using his Flonase. ? ?Social History  ? ?Tobacco Use  ?Smoking Status Never  ?Smokeless Tobacco Never  ? ? ?Current Outpatient Medications on File Prior to Visit  ?Medication Sig Dispense Refill  ? albuterol (VENTOLIN HFA) 108 (90 Base) MCG/ACT inhaler Inhale 1-2 puffs into the lungs every 6 (six) hours as needed for wheezing or shortness of breath. 18 g 11  ? Ascorbic Acid (VITAMIN C) 1000 MG tablet Take 500 mg by mouth daily.    ? aspirin 81 MG tablet Take 81 mg by mouth daily.    ? atorvastatin (LIPITOR) 20 MG tablet Take 1 tablet (20 mg total) by mouth daily at 6 PM. 90 tablet 3  ? BD PEN NEEDLE NANO 2ND GEN 32G X 4 MM MISC INJECT 1 EACH INTO THE SKIN 4 (FOUR) TIMES DAILY. 200 each 1  ? calcium carbonate (OSCAL) 1500 (600 Ca) MG TABS tablet Take 600 mg of elemental calcium by mouth daily with breakfast.    ? Cholecalciferol (VITAMIN D-3) 125 MCG (5000 UT) TABS Take 5,000 Units by mouth daily.    ? Continuous Blood Gluc Receiver (FREESTYLE LIBRE 2 READER) DEVI Use to check glucose at least TID 1 each 1  ? Continuous Blood Gluc Sensor (FREESTYLE LIBRE 2 SENSOR) MISC Use to check glucose at least three times daily 2 each 11  ? diclofenac Sodium (VOLTAREN) 1 % GEL Apply 2 g topically 4 (four) times daily. Prn 100 g 11  ? dutasteride (AVODART) 0.5 MG capsule Take 0.5 mg by mouth daily.    ? Fluocinolone Acetonide 0.01 % OIL Place 5 drops in ear(s) 2 (two) times daily as needed. X1- 2 weeks (Patient taking differently: Place 5 drops in ear(s) as needed.) 20 mL 11  ?  insulin degludec (TRESIBA FLEXTOUCH) 200 UNIT/ML FlexTouch Pen Inject 78 Units into the skin daily. Reduce from 80 units 72 mL 1  ? Lancets (ONETOUCH ULTRASOFT) lancets Use as instructed 2x daily Dx: 250.00 100 each 5  ? losartan (COZAAR) 50 MG tablet Take 1 tablet (50 mg total) by mouth 2 (two) times daily. Take another pill in afternoon if BP>130/>80. D/c 100 mg qd 180 tablet 3  ? MAGNESIUM PO Take 1 tablet by mouth daily.    ? metFORMIN (GLUCOPHAGE XR) 500 MG 24 hr tablet Take 2 tablets (1,000 mg total) by mouth 2 (two) times daily. 180 tablet 1  ? metoprolol succinate (TOPROL-XL) 25 MG 24 hr tablet TAKE 1 TABLET BY MOUTH EVERY DAY 90 tablet 3  ? omeprazole (PRILOSEC) 40 MG capsule Take 1 capsule (40 mg total) by mouth daily. 90 capsule 3  ? ONETOUCH ULTRA test strip TEST TWICE A DAY 100 strip 9  ? Potassium 99 MG TABS Take 99 mg by mouth daily.    ? Semaglutide,0.25 or 0.'5MG'$ /DOS, (OZEMPIC, 0.25 OR 0.5 MG/DOSE,) 2 MG/3ML SOPN Inject 0.5 mg into the skin once a week. 3 mL 3  ? tamsulosin (FLOMAX) 0.4 MG CAPS capsule Take 0.4  mg by mouth daily.    ? vitamin E 400 UNIT capsule Take 400 Units by mouth daily.    ? fluticasone (FLONASE) 50 MCG/ACT nasal spray Place 2 sprays into both nostrils daily. (Patient not taking: Reported on 03/17/2022) 16 g 11  ? sodium chloride (OCEAN) 0.65 % SOLN nasal spray Place 1 spray into both nostrils as needed for congestion. (Patient not taking: Reported on 03/17/2022) 30 mL 11  ? triamcinolone ointment (KENALOG) 0.1 % Apply 1 application topically 2 (two) times daily as needed (Rash). Apply to rash at ears twice a day as needed for up to 2 weeks at a time. Avoid applying to face, groin, and axilla (Patient not taking: Reported on 03/17/2022) 30 g 3  ? ?Current Facility-Administered Medications on File Prior to Visit  ?Medication Dose Route Frequency Provider Last Rate Last Admin  ? cyanocobalamin ((VITAMIN B-12)) injection 1,000 mcg  1,000 mcg Intramuscular Q30 days McLean-Scocuzza,  Nino Glow, MD   1,000 mcg at 08/01/20 1027  ? ? ? ?ROS see history of present illness ? ?Objective ? ?Physical Exam ?Vitals:  ? 03/17/22 1557  ?BP: 132/60  ?Pulse: 72  ?Resp: 14  ?Temp: 98.5 ?F (36.9 ?C)  ?SpO2: 97%  ? ? ?BP Readings from Last 3 Encounters:  ?03/17/22 132/60  ?02/18/22 120/70  ?01/20/22 124/70  ? ?Wt Readings from Last 3 Encounters:  ?03/17/22 208 lb 6.4 oz (94.5 kg)  ?02/18/22 210 lb 6.4 oz (95.4 kg)  ?01/20/22 207 lb (93.9 kg)  ? ? ?Physical Exam ?Constitutional:   ?   General: He is not in acute distress. ?   Appearance: He is not diaphoretic.  ?HENT:  ?   Right Ear: Tympanic membrane normal.  ?   Left Ear: Tympanic membrane normal.  ?   Mouth/Throat:  ?   Mouth: Mucous membranes are moist.  ?   Pharynx: Oropharynx is clear.  ?Cardiovascular:  ?   Rate and Rhythm: Normal rate and regular rhythm.  ?   Heart sounds: Normal heart sounds.  ?Pulmonary:  ?   Effort: Pulmonary effort is normal.  ?   Breath sounds: Normal breath sounds.  ?Skin: ?   General: Skin is warm and dry.  ?Neurological:  ?   Mental Status: He is alert.  ? ? ? ?Assessment/Plan: Please see individual problem list. ? ?Problem List Items Addressed This Visit   ? ? Allergic rhinitis  ?  The patient symptoms are consistent with allergies.  I encouraged him to try his Flonase 2 sprays each nostril once daily.  If this is not beneficial over the next couple of days he can add Zyrtec over-the-counter.  If that is not helpful he will call us for further treatment.  Discussed the risk of nosebleeds with the Flonase. ? ?  ?  ? ? ? ?Return if symptoms worsen or fail to improve. ? ?This visit occurred during the SARS-CoV-2 public health emergency.  Safety protocols were in place, including screening questions prior to the visit, additional usage of staff PPE, and extensive cleaning of exam room while observing appropriate contact time as indicated for disinfecting solutions.  ? ? ?Tommi Rumps, MD ?[place] ? ?

## 2022-03-17 NOTE — Patient Instructions (Signed)
Nice to meet you. ?Please try using the Flonase that you have at home to see if that would be beneficial.  You can use 2 sprays in each nostril.  If that is not helpful you can add on Zyrtec.  If that is not beneficial by the end of the week please let me know. ?

## 2022-03-21 ENCOUNTER — Other Ambulatory Visit: Payer: Self-pay | Admitting: Internal Medicine

## 2022-03-21 DIAGNOSIS — I10 Essential (primary) hypertension: Secondary | ICD-10-CM

## 2022-03-21 DIAGNOSIS — I2510 Atherosclerotic heart disease of native coronary artery without angina pectoris: Secondary | ICD-10-CM

## 2022-03-31 ENCOUNTER — Telehealth: Payer: Self-pay

## 2022-03-31 NOTE — Telephone Encounter (Signed)
Lvm for pt to return call to sch fasting labs after 05/18/22 or after and f/u after labs in 4-6 months. ?

## 2022-03-31 NOTE — Progress Notes (Signed)
Lvm for pt to return call to sch fasting labs after 05/18/22 or after and f/u after labs in 4-6 months. ?

## 2022-04-02 ENCOUNTER — Telehealth: Payer: Self-pay | Admitting: Internal Medicine

## 2022-04-02 NOTE — Telephone Encounter (Signed)
Patient saw Dr Caryl Bis on 03/17/2022. Dr Caryl Bis told patient to call him if not feeling better. Patient still has runny nose, sneezing, and coughing. Patient requesting medication. ?

## 2022-04-03 ENCOUNTER — Ambulatory Visit: Payer: PPO

## 2022-04-03 MED ORDER — AZELASTINE HCL 0.1 % NA SOLN: 2.0000 | Freq: Two times a day (BID) | NASAL | 12 refills | Status: DC

## 2022-04-03 NOTE — Telephone Encounter (Signed)
I called the patient and informed him of the nasal spray that was sent to the pharmacy.  Patient stated he has developed a cough and congestions and I suggested to the patient to take a at home covid test because he has a virtual visit with his PCP on tomorrow.  Mariette Cowley,cma  ?

## 2022-04-03 NOTE — Telephone Encounter (Signed)
Astelin sent to the pharmacy for him to try for his allergy symptoms.  ?

## 2022-04-03 NOTE — Addendum Note (Signed)
Addended by: Caryl Bis Hava Massingale G on: 04/03/2022 01:15 PM ? ? Modules accepted: Orders ? ?

## 2022-04-04 ENCOUNTER — Ambulatory Visit
Admission: RE | Admit: 2022-04-04 | Discharge: 2022-04-04 | Disposition: A | Discharge status: Home / Self Care | Payer: PPO | Attending: Internal Medicine | Admitting: Internal Medicine

## 2022-04-04 ENCOUNTER — Telehealth (INDEPENDENT_AMBULATORY_CARE_PROVIDER_SITE_OTHER): Payer: PPO | Admitting: Internal Medicine

## 2022-04-04 ENCOUNTER — Encounter: Payer: Self-pay | Admitting: Internal Medicine

## 2022-04-04 ENCOUNTER — Ambulatory Visit
Admission: RE | Admit: 2022-04-04 | Discharge: 2022-04-04 | Disposition: A | Discharge status: Home / Self Care | Payer: PPO | Source: Ambulatory Visit | Attending: Internal Medicine | Admitting: Internal Medicine

## 2022-04-04 DIAGNOSIS — J441 Chronic obstructive pulmonary disease with (acute) exacerbation: Secondary | ICD-10-CM | POA: Diagnosis not present

## 2022-04-04 DIAGNOSIS — R051 Acute cough: Secondary | ICD-10-CM

## 2022-04-04 DIAGNOSIS — R059 Cough, unspecified: Secondary | ICD-10-CM | POA: Diagnosis not present

## 2022-04-04 DIAGNOSIS — R0602 Shortness of breath: Secondary | ICD-10-CM | POA: Diagnosis not present

## 2022-04-04 IMAGING — CR DG CHEST 2V
1 series · 2 of 2 positions shown · non-contrast
Comparison: [DATE]

CLINICAL DATA: Shortness of breath and cough

EXAM:
CHEST - 2 VIEW

[Series 1: dg chest 2 view · 0.14mm/px · 2 of 2 slices shown]
[im 1/2]
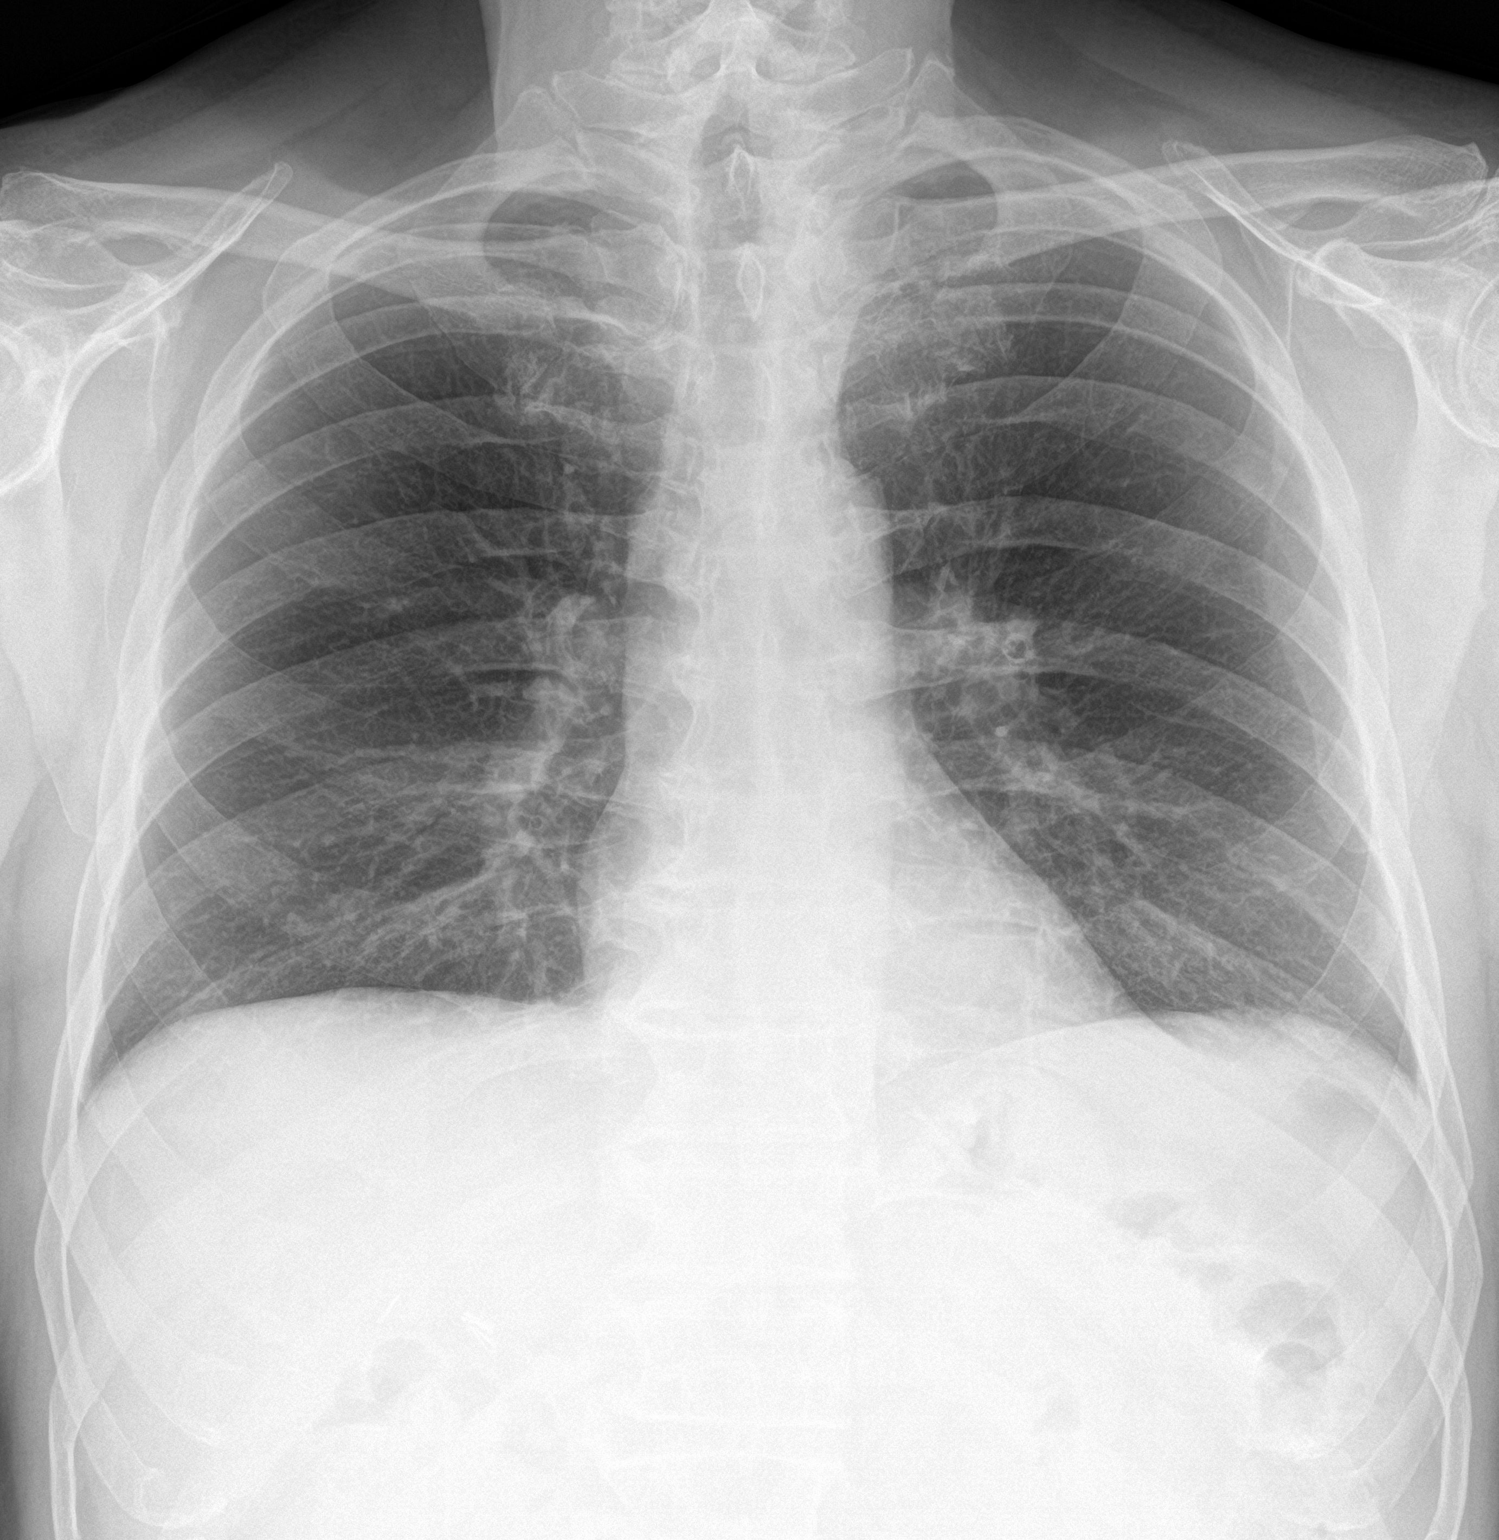
[im 2/2]
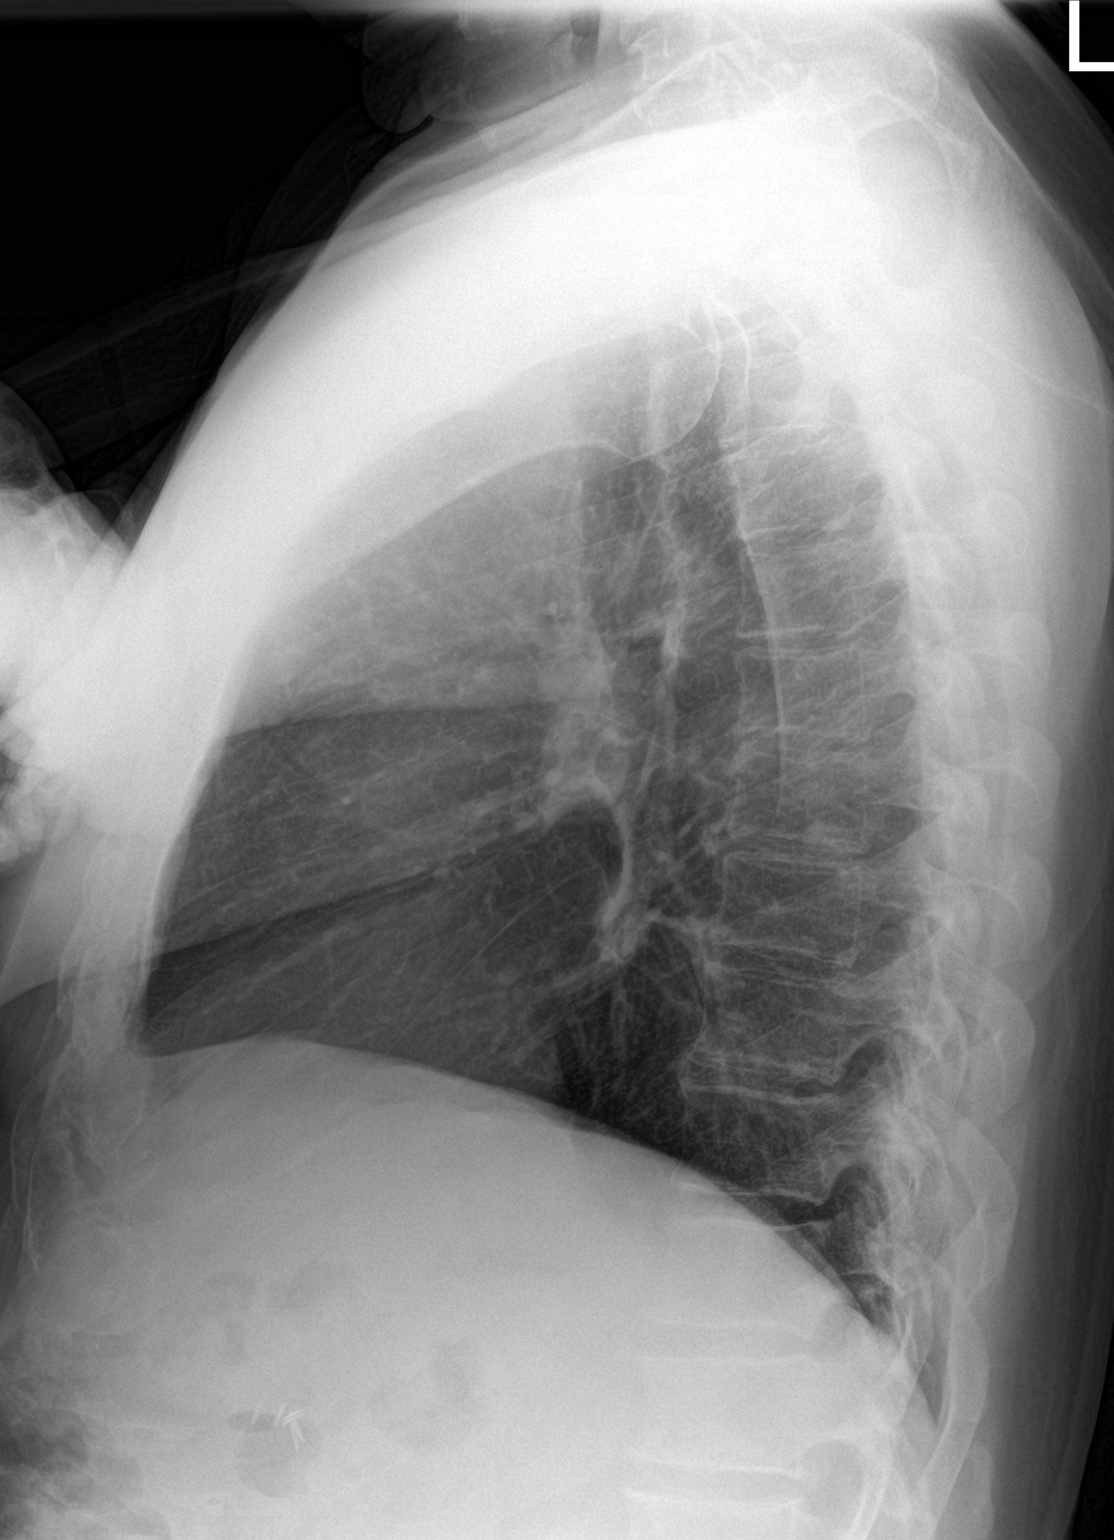

[2 of 2 positions shown; findings below may reference images not displayed]

FINDINGS: The heart size and mediastinal contours are within normal limits.
Both lungs are clear. The visualized skeletal structures are
unremarkable.
IMPRESSION: No active cardiopulmonary disease.

## 2022-04-04 MED ORDER — HYDROCOD POLI-CHLORPHE POLI ER 10-8 MG/5ML PO SUER: 5.0000 mL | Freq: Every evening | ORAL | 0 refills | Status: DC | PRN

## 2022-04-04 MED ORDER — DM-GUAIFENESIN ER 60-1200 MG PO TB12: 1.0000 | ORAL_TABLET | Freq: Two times a day (BID) | ORAL | 0 refills | Status: DC

## 2022-04-04 MED ORDER — METHYLPREDNISOLONE 4 MG PO TBPK: ORAL_TABLET | ORAL | 0 refills | Status: DC

## 2022-04-04 MED ORDER — AMOXICILLIN-POT CLAVULANATE 875-125 MG PO TABS: 1.0000 | ORAL_TABLET | Freq: Two times a day (BID) | ORAL | 0 refills | Status: DC

## 2022-04-04 NOTE — Progress Notes (Signed)
Virtual Visit via Video Note ? ?I connected with [name] ? on 04/04/22 at  10:00 AM EDT by a video enabled telemedicine application and verified that I am speaking with the correct person using two identifiers. ? Location patient: [place] ?Location provider:work or home office ?Persons participating in the virtual visit: patient, provider ? ?I discussed the limitations and requested verbal permission for telemedicine visit. The patient expressed understanding and agreed to proceed. ? ? ?HPI: ? ?Acute telemedicine visit for : ?Worsening cough since 03/17/22 advised at this te to take zyrtec flonase, albuterol inhaler for allergies 4 days ago had cough and sob and wheezing at night has to sleep propped up no fever no sick contacts tested for covid yesterday negative  ?He is producing dark phelgm  ? ? ?-Pertinent past medical history: see below ?-Pertinent medication allergies: ?Allergies  ?Allergen Reactions  ? Sulfamethoxazole-Trimethoprim Rash  ? ?-COVID-19 vaccine status:  ?Immunization History  ?Administered Date(s) Administered  ? Fluad Quad(high Dose 65+) 07/27/2019, 08/01/2020, 10/01/2021  ? Influenza Whole 08/24/2009, 08/25/2011  ? Influenza, High Dose Seasonal PF 08/21/2016, 08/25/2017, 08/31/2018  ? Influenza,inj,Quad PF,6+ Mos 10/29/2015  ? Influenza-Unspecified 10/29/2015  ? Moderna SARS-COV2 Booster Vaccination 11/24/2020, 03/19/2021  ? Moderna Sars-Covid-2 Vaccination 01/05/2020, 02/02/2020  ? Pneumococcal Conjugate-13 01/23/2017  ? Pneumococcal Polysaccharide-23 09/25/2003, 03/27/2015  ? Td 09/25/2003  ? Tdap 08/31/2018  ? ? ? ?ROS: See pertinent positives and negatives per HPI. ? ?Past Medical History:  ?Diagnosis Date  ? Adenomatous colon polyp 06/2001  ? ALLERGIC RHINITIS 08/03/2007  ? Allergy   ? Asthma   ? BENIGN PROSTATIC HYPERTROPHY 08/03/2007  ? Chronic back pain   ? COPD (chronic obstructive pulmonary disease) ([place])   ? Coronary artery disease   ? 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  09/29/2008  ? Diabetes mellitus without complication ([place])   ? x 30-40 years as of 11/08/18   ? DIABETIC RETINOPATHY, BACKGROUND 09/29/2008  ? DYSPHAGIA UNSPECIFIED 09/29/2008  ? Dysplastic nevus 02/20/2021  ? L post thigh - mild   ? ESOPHAGEAL STRICTURE 12/28/2008  ? GERD 11/28/2008  ? History of kidney stones   ? HYPERLIPIDEMIA 08/03/2007  ? 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 PAIN IN SOFT TISSUES OF LIMB 01/07/2008  ? Prostate cancer ([place])   ? UNSPECIFIED ANEMIA 12/29/2007  ? Unspecified essential hypertension 01/07/2008  ? URINARY CALCULUS 09/29/2008  ? Urine incontinence   ? ? ?Past Surgical History:  ?Procedure Laterality Date  ? CHOLECYSTECTOMY N/A 11/12/2016  ? Procedure: LAPAROSCOPIC CHOLECYSTECTOMY WITH INTRAOPERATIVE CHOLANGIOGRAM;  Surgeon: Clayburn Pert, MD;  Location: [facility] ORS;  Service: General;  Laterality: N/A;  ? COLONOSCOPY    ? polyp  ? dupyretens contracture    ? right hand surgery,left hand   ? HAND SURGERY Right   ? 2016  ? LITHOTRIPSY    ? PROSTATE BIOPSY N/A 12/05/2021  ? Procedure: PROSTATE BIOPSY URONAV;  Surgeon: Royston Cowper, MD;  Location: [facility] ORS;  Service: Urology;  Laterality: N/A;  ? SHOULDER SURGERY Left   ? left, cyst and tumor removed  ? URINARY SURGERY  2018  ? Dr Daiva Eves "stretched" urethra around prostate to increase urine flow  ? ? ? ?Current Outpatient Medications:  ?  albuterol (VENTOLIN HFA) 108 (90 Base) MCG/ACT inhaler, Inhale 1-2 puffs into the lungs every 6 (six) hours as needed for wheezing or shortness of breath., Disp: 18 g, Rfl: 11 ?  Ascorbic Acid (VITAMIN C) 1000 MG tablet, Take 500 mg by mouth  daily., Disp: , Rfl:  ?  aspirin 81 MG tablet, Take 81 mg by mouth daily., Disp: , Rfl:  ?  atorvastatin (LIPITOR) 20 MG tablet, Take 1 tablet (20 mg total) by mouth daily at 6 PM., Disp: 90 tablet, Rfl: 3 ?  azelastine (ASTELIN) 0.1 % nasal spray, Place 2 sprays into both nostrils 2 (two) times daily. Use in each nostril as directed, Disp: 30 mL, Rfl:  12 ?  BD PEN NEEDLE NANO 2ND GEN 32G X 4 MM MISC, INJECT 1 EACH INTO THE SKIN 4 (FOUR) TIMES DAILY., Disp: 200 each, Rfl: 1 ?  calcium carbonate (OSCAL) 1500 (600 Ca) MG TABS tablet, Take 600 mg of elemental calcium by mouth daily with breakfast., Disp: , Rfl:  ?  chlorpheniramine-HYDROcodone (TUSSIONEX PENNKINETIC ER) 10-8 MG/5ML, Take 5 mLs by mouth at bedtime as needed., Disp: 115 mL, Rfl: 0 ?  Cholecalciferol (VITAMIN D-3) 125 MCG (5000 UT) TABS, Take 5,000 Units by mouth daily., Disp: , Rfl:  ?  Continuous Blood Gluc Receiver (FREESTYLE LIBRE 2 READER) DEVI, Use to check glucose at least TID, Disp: 1 each, Rfl: 1 ?  Continuous Blood Gluc Sensor (FREESTYLE LIBRE 2 SENSOR) MISC, Use to check glucose at least three times daily, Disp: 2 each, Rfl: 11 ?  Dextromethorphan-Guaifenesin 60-1200 MG 12hr tablet, Take 1 tablet by mouth every 12 (twelve) hours., Disp: 60 tablet, Rfl: 0 ?  diclofenac Sodium (VOLTAREN) 1 % GEL, Apply 2 g topically 4 (four) times daily. Prn, Disp: 100 g, Rfl: 11 ?  dutasteride (AVODART) 0.5 MG capsule, Take 0.5 mg by mouth daily., Disp: , Rfl:  ?  Fluocinolone Acetonide 0.01 % OIL, Place 5 drops in ear(s) 2 (two) times daily as needed. X1- 2 weeks (Patient taking differently: Place 5 drops in ear(s) as needed.), Disp: 20 mL, Rfl: 11 ?  fluticasone (FLONASE) 50 MCG/ACT nasal spray, Place 2 sprays into both nostrils daily., Disp: 16 g, Rfl: 11 ?  insulin degludec (TRESIBA FLEXTOUCH) 200 UNIT/ML FlexTouch Pen, Inject 78 Units into the skin daily. Reduce from 80 units, Disp: 72 mL, Rfl: 1 ?  Lancets (ONETOUCH ULTRASOFT) lancets, Use as instructed 2x daily Dx: 250.00, Disp: 100 each, Rfl: 5 ?  losartan (COZAAR) 50 MG tablet, Take 1 tablet (50 mg total) by mouth 2 (two) times daily. Take another pill in afternoon if BP>130/>80. D/c 100 mg qd, Disp: 180 tablet, Rfl: 3 ?  MAGNESIUM PO, Take 1 tablet by mouth daily., Disp: , Rfl:  ?  metFORMIN (GLUCOPHAGE XR) 500 MG 24 hr tablet, Take 2 tablets (1,000  mg total) by mouth 2 (two) times daily., Disp: 180 tablet, Rfl: 1 ?  methylPREDNISolone (MEDROL DOSEPAK) 4 MG TBPK tablet, Use as directed take in the am, Disp: 21 tablet, Rfl: 0 ?  metoprolol succinate (TOPROL-XL) 25 MG 24 hr tablet, TAKE 1 TABLET BY MOUTH EVERY DAY, Disp: 90 tablet, Rfl: 1 ?  omeprazole (PRILOSEC) 40 MG capsule, Take 1 capsule (40 mg total) by mouth daily., Disp: 90 capsule, Rfl: 3 ?  ONETOUCH ULTRA test strip, TEST TWICE A DAY, Disp: 100 strip, Rfl: 9 ?  Potassium 99 MG TABS, Take 99 mg by mouth daily., Disp: , Rfl:  ?  Semaglutide,0.25 or 0.'5MG'$ /DOS, (OZEMPIC, 0.25 OR 0.5 MG/DOSE,) 2 MG/3ML SOPN, Inject 0.5 mg into the skin once a week., Disp: 3 mL, Rfl: 3 ?  tamsulosin (FLOMAX) 0.4 MG CAPS capsule, Take 0.4 mg by mouth daily., Disp: , Rfl:  ?  vitamin E  400 UNIT capsule, Take 400 Units by mouth daily., Disp: , Rfl:  ?  amoxicillin-clavulanate (AUGMENTIN) 875-125 MG tablet, Take 1 tablet by mouth 2 (two) times daily. With food, Disp: 14 tablet, Rfl: 0 ?  sodium chloride (OCEAN) 0.65 % SOLN nasal spray, Place 1 spray into both nostrils as needed for congestion. (Patient not taking: Reported on 03/17/2022), Disp: 30 mL, Rfl: 11 ?  triamcinolone ointment (KENALOG) 0.1 %, Apply 1 application topically 2 (two) times daily as needed (Rash). Apply to rash at ears twice a day as needed for up to 2 weeks at a time. Avoid applying to face, groin, and axilla (Patient not taking: Reported on 03/17/2022), Disp: 30 g, Rfl: 3 ? ?Current Facility-Administered Medications:  ?  cyanocobalamin ((VITAMIN B-12)) injection 1,000 mcg, 1,000 mcg, Intramuscular, Q30 days, McLean-Scocuzza, Nino Glow, MD, 1,000 mcg at 08/01/20 1027 ? ?EXAM: ? ?VITALS per patient if applicable: ? ?GENERAL: alert, oriented, appears well and in no acute distress ? ?HEENT: atraumatic, conjunttiva clear, no obvious abnormalities on inspection of external nose and ears ? ?NECK: normal movements of the head and neck ? ?LUNGS: on inspection no signs  of respiratory distress, breathing rate appears normal, no obvious gross SOB, gasping or wheezing ?+cough on exa m ? ?CV: no obvious cyanosis ? ?MS: moves all visible extremities without noticeable abnorma

## 2022-04-04 NOTE — Patient Instructions (Signed)
[facility] outpatient imaging  ?Address: [address], [place], [place] [address] ?phone: [phone] ? ?Mucinex dm green label for cough or robitussin DM  ?Multivitamin or below vitamins  ?Vitamin C 1000 mg daily.  ?Vitamin D3 4000 Iu (units) daily.  ?Zinc 100 mg daily.  ?Quercetin 250-500 mg 2 times per day   ?Elderberry  ?Oil of oregano  ?cepacol or chloroseptic spray ?Warm salt water gargles +hydrogen peroxide ?Sugar free cough drops  ?Warm tea with honey and lemon  ?Hydration  ?Try to eat though you dont feel like it   ?Tylenol or Advil  ?Nasal saline and Flonase 2 sprays nasal congestion  ?If sneezing/runny nose over the counter allergy pill claritin,allegra, zyrtec, xyzal ? ?Monitor pulse oximeter, buy from [facility] if oxygen is less than 90 please go to the hospital.  ?   ?   ?Are you feeling really sick? Shortness of breath, cough, chest pain?, dizziness? Confusion  ? If so let me know  ?If worsening, go to hospital or [facility] clinic Urgent care for further treatment.    ?

## 2022-04-08 ENCOUNTER — Ambulatory Visit (AMBULATORY_SURGERY_CENTER): Payer: PPO | Admitting: *Deleted

## 2022-04-08 VITALS — Ht 72.0 in | Wt 200.0 lb

## 2022-04-08 DIAGNOSIS — Z86010 Personal history of colonic polyps: Secondary | ICD-10-CM

## 2022-04-08 MED ORDER — NA SULFATE-K SULFATE-MG SULF 17.5-3.13-1.6 GM/177ML PO SOLN: 1.0000 | Freq: Once | ORAL | 0 refills | Status: AC

## 2022-04-08 NOTE — Progress Notes (Signed)

## 2022-04-23 DIAGNOSIS — H35371 Puckering of macula, right eye: Secondary | ICD-10-CM | POA: Diagnosis not present

## 2022-04-23 LAB — HM DIABETES EYE EXAM

## 2022-04-24 ENCOUNTER — Ambulatory Visit
Admission: EM | Admit: 2022-04-24 | Discharge: 2022-04-24 | Disposition: A | Discharge status: Home / Self Care | Payer: PPO | Attending: Nurse Practitioner | Admitting: Nurse Practitioner

## 2022-04-24 ENCOUNTER — Other Ambulatory Visit: Payer: Self-pay

## 2022-04-24 ENCOUNTER — Encounter (HOSPITAL_COMMUNITY): Payer: Self-pay | Admitting: Emergency Medicine

## 2022-04-24 ENCOUNTER — Emergency Department (HOSPITAL_COMMUNITY)
Admission: EM | Admit: 2022-04-24 | Discharge: 2022-04-24 | Disposition: A | Discharge status: Home / Self Care | Payer: PPO | Attending: Emergency Medicine | Admitting: Emergency Medicine

## 2022-04-24 ENCOUNTER — Encounter: Payer: Self-pay | Admitting: Emergency Medicine

## 2022-04-24 DIAGNOSIS — Z7982 Long term (current) use of aspirin: Secondary | ICD-10-CM | POA: Insufficient documentation

## 2022-04-24 DIAGNOSIS — R55 Syncope and collapse: Secondary | ICD-10-CM | POA: Diagnosis not present

## 2022-04-24 LAB — BASIC METABOLIC PANEL
Anion gap: 7 (ref 5–15)
BUN: 22 mg/dL (ref 8–23)
CO2: 23 mmol/L (ref 22–32)
Calcium: 8.7 mg/dL — ABNORMAL LOW (ref 8.9–10.3)
Chloride: 107 mmol/L (ref 98–111)
Creatinine, Ser: 1.28 mg/dL — ABNORMAL HIGH (ref 0.61–1.24)
GFR, Estimated: 59 mL/min — ABNORMAL LOW (ref >60)
Glucose, Bld: 165 mg/dL — ABNORMAL HIGH (ref 70–99)
Potassium: 3.9 mmol/L (ref 3.5–5.1)
Sodium: 137 mmol/L (ref 135–145)

## 2022-04-24 LAB — URINALYSIS, ROUTINE W REFLEX MICROSCOPIC
Bacteria, UA: NONE SEEN
Bilirubin Urine: NEGATIVE
Glucose, UA: NEGATIVE mg/dL
Hgb urine dipstick: NEGATIVE
Ketones, ur: 5 mg/dL — AB
Leukocytes,Ua: NEGATIVE
Nitrite: NEGATIVE
Protein, ur: 30 mg/dL — AB
Specific Gravity, Urine: 1.021 (ref 1.005–1.030)
pH: 5 (ref 5.0–8.0)

## 2022-04-24 LAB — CBC
HCT: 38.5 % — ABNORMAL LOW (ref 39.0–52.0)
Hemoglobin: 13.4 g/dL (ref 13.0–17.0)
MCH: 33.8 pg (ref 26.0–34.0)
MCHC: 34.8 g/dL (ref 30.0–36.0)
MCV: 97.2 fL (ref 80.0–100.0)
Platelets: 183 10*3/uL (ref 150–400)
RBC: 3.96 MIL/uL — ABNORMAL LOW (ref 4.22–5.81)
RDW: 13.2 % (ref 11.5–15.5)
WBC: 9.7 10*3/uL (ref 4.0–10.5)
nRBC: 0 % (ref 0.0–0.2)

## 2022-04-24 LAB — TROPONIN I (HIGH SENSITIVITY)
Troponin I (High Sensitivity): 2 ng/L (ref <18)
Troponin I (High Sensitivity): 2 ng/L (ref <18)

## 2022-04-24 LAB — CBG MONITORING, ED: Glucose-Capillary: 143 mg/dL — ABNORMAL HIGH (ref 70–99)

## 2022-04-24 LAB — POCT FASTING CBG KUC MANUAL ENTRY: POCT Glucose (KUC): 131 mg/dL — AB (ref 70–99)

## 2022-04-24 NOTE — ED Notes (Signed)
Patient is being discharged from the Urgent Care and sent to the Emergency Department via private vehicle . Per NP, patient is in need of higher level of care due to syncopal episode. Patient is aware and verbalizes understanding of plan of care.  Vitals:   04/24/22 1602  BP: 135/69  Pulse: 99  Resp: 18  Temp: 97.8 F (36.6 C)  SpO2: 92%

## 2022-04-24 NOTE — ED Triage Notes (Signed)
States he passed out today in a field.  States he feels SOB.  Denies any pain

## 2022-04-24 NOTE — ED Provider Notes (Signed)
[facility] EMERGENCY DEPARTMENT Provider Note   CSN: [id number] Arrival date & time: 04/24/22  1619     History {Add pertinent medical, surgical, social history, OB history to HPI:1} Chief Complaint  Patient presents with   Loss of Consciousness    [name] is a 76 y.o. male.  HPI Patient here for evaluation of syncope.  Patient is here with his son who gives history.  Son was not directly present when this happened but was close by. There are other people there who saw the patient walking in a field, "spreading seeds," when he suddenly fell.  He then tried to get up and fell again.  He fell backwards into the dirt.  He was unconscious for 30 seconds and awoke.  They were able to get him up and walk.  They immediately gave him a Powerade thinking that his blood sugar may have been low.  This morning the patient only took some of his blood pressure medicine because he knew his going to be working outside.  He commonly has difficulty with lightheadedness after taking medicines and working.  He has not been sick otherwise.  There is no significant trauma associated with the falls.  Patient went to an urgent care who did a blood sugar and found to be normal and sent him here.  Per report of son, they checked his blood pressure in the field and it was normal.    Home Medications Prior to Admission medications   Medication Sig Start Date End Date Taking? Authorizing Provider  albuterol (VENTOLIN HFA) 108 (90 Base) MCG/ACT inhaler Inhale 1-2 puffs into the lungs every 6 (six) hours as needed for wheezing or shortness of breath. 04/16/21  Yes McLean-Scocuzza, Nino Glow, MD  Ascorbic Acid (VITAMIN C) 1000 MG tablet Take 500 mg by mouth daily.   Yes [provider]  aspirin 81 MG tablet Take 81 mg by mouth daily.   Yes [provider]  atorvastatin (LIPITOR) 20 MG tablet Take 1 tablet (20 mg total) by mouth daily at 6 PM. Patient taking differently: Take 20 mg by mouth daily.  04/16/21  Yes McLean-Scocuzza, Nino Glow, MD  azelastine (ASTELIN) 0.1 % nasal spray Place 2 sprays into both nostrils 2 (two) times daily. Use in each nostril as directed 04/03/22  Yes Leone Haven, MD  calcium carbonate (OSCAL) 1500 (600 Ca) MG TABS tablet Take 600 mg of elemental calcium by mouth daily with breakfast.   Yes [provider]  cetirizine (ZYRTEC) 10 MG tablet Take 10 mg by mouth daily.   Yes [provider]  chlorpheniramine-HYDROcodone (TUSSIONEX PENNKINETIC ER) 10-8 MG/5ML Take 5 mLs by mouth at bedtime as needed. 04/04/22  Yes McLean-Scocuzza, Nino Glow, MD  Cholecalciferol (VITAMIN D-3) 125 MCG (5000 UT) TABS Take 5,000 Units by mouth daily.   Yes [provider]  Dextromethorphan-Guaifenesin 60-1200 MG 12hr tablet Take 1 tablet by mouth every 12 (twelve) hours. 04/04/22  Yes McLean-Scocuzza, Nino Glow, MD  diclofenac Sodium (VOLTAREN) 1 % GEL Apply 2 g topically 4 (four) times daily. Prn 01/20/20  Yes McLean-Scocuzza, Nino Glow, MD  dutasteride (AVODART) 0.5 MG capsule Take 0.5 mg by mouth daily. 12/10/21  Yes [provider]  fluticasone (FLONASE) 50 MCG/ACT nasal spray Place 2 sprays into both nostrils daily. 01/23/21  Yes McLean-Scocuzza, Nino Glow, MD  insulin degludec (TRESIBA FLEXTOUCH) 200 UNIT/ML FlexTouch Pen Inject 78 Units into the skin daily. Reduce from 80 units 02/18/22  Yes McLean-Scocuzza, Nino Glow,  MD  losartan (COZAAR) 50 MG tablet Take 1 tablet (50 mg total) by mouth 2 (two) times daily. Take another pill in afternoon if BP>130/>80. D/c 100 mg qd 02/18/22  Yes McLean-Scocuzza, Nino Glow, MD  MAGNESIUM PO Take 1 tablet by mouth daily.   Yes [provider]  metFORMIN (GLUCOPHAGE XR) 500 MG 24 hr tablet Take 2 tablets (1,000 mg total) by mouth 2 (two) times daily. 12/25/21  Yes McLean-Scocuzza, Nino Glow, MD  metoprolol succinate (TOPROL-XL) 25 MG 24 hr tablet TAKE 1 TABLET BY MOUTH EVERY DAY 03/21/22  Yes McLean-Scocuzza, Nino Glow, MD   omeprazole (PRILOSEC) 40 MG capsule Take 1 capsule (40 mg total) by mouth daily. 04/16/21  Yes McLean-Scocuzza, Nino Glow, MD  Potassium 99 MG TABS Take 99 mg by mouth daily.   Yes [provider]  Semaglutide,0.25 or 0.'5MG'$ /DOS, (OZEMPIC, 0.25 OR 0.5 MG/DOSE,) 2 MG/3ML SOPN Inject 0.5 mg into the skin once a week. 02/25/22  Yes McLean-Scocuzza, Nino Glow, MD  tamsulosin (FLOMAX) 0.4 MG CAPS capsule Take 0.4 mg by mouth daily. 09/14/20  Yes [provider]  amoxicillin-clavulanate (AUGMENTIN) 875-125 MG tablet Take 1 tablet by mouth 2 (two) times daily. With food Patient not taking: Reported on 04/24/2022 04/04/22   McLean-Scocuzza, Nino Glow, MD  BD PEN NEEDLE NANO 2ND GEN 32G X 4 MM MISC INJECT 1 EACH INTO THE SKIN 4 (FOUR) TIMES DAILY. 03/10/22   McLean-Scocuzza, Nino Glow, MD  Continuous Blood Gluc Receiver (FREESTYLE LIBRE 2 READER) DEVI Use to check glucose at least TID 05/08/21   McLean-Scocuzza, Nino Glow, MD  Continuous Blood Gluc Sensor (FREESTYLE LIBRE 2 SENSOR) MISC Use to check glucose at least three times daily 05/08/21   McLean-Scocuzza, Nino Glow, MD  Lancets [facility] ULTRASOFT) lancets Use as instructed 2x daily Dx: 250.00 01/15/18   Renato Shin, MD  methylPREDNISolone (MEDROL DOSEPAK) 4 MG TBPK tablet Use as directed take in the am Patient not taking: Reported on 04/24/2022 04/04/22   McLean-Scocuzza, Nino Glow, MD  [facility] ULTRA test strip TEST TWICE A DAY 02/16/21   Renato Shin, MD  vitamin E 400 UNIT capsule Take 400 Units by mouth daily.    [provider]      Allergies    Sulfamethoxazole-trimethoprim    Review of Systems   Review of Systems  Physical Exam Updated Vital Signs BP (!) 149/68   Pulse 90   Temp 97.6 F (36.4 C) (Oral)   Resp 16   Ht 6' (1.829 m)   Wt 88 kg   SpO2 99%   BMI 26.31 kg/m  Physical Exam Vitals and nursing note reviewed.  Constitutional:      General: He is not in acute distress.    Appearance: He is well-developed. He is not  ill-appearing, toxic-appearing or diaphoretic.  HENT:     Head: Normocephalic and atraumatic.     Right Ear: External ear normal.     Left Ear: External ear normal.  Eyes:     Conjunctiva/sclera: Conjunctivae normal.     Pupils: Pupils are equal, round, and reactive to light.  Neck:     Trachea: Phonation normal.  Cardiovascular:     Rate and Rhythm: Normal rate and regular rhythm.     Heart sounds: Normal heart sounds.  Pulmonary:     Effort: Pulmonary effort is normal.     Breath sounds: Normal breath sounds.  Abdominal:     Palpations: Abdomen is soft.     Tenderness: There is  no abdominal tenderness.  Musculoskeletal:        General: No swelling or tenderness. Normal range of motion.     Cervical back: Normal range of motion and neck supple.  Skin:    General: Skin is warm and dry.  Neurological:     Mental Status: He is alert and oriented to person, place, and time.     Cranial Nerves: No cranial nerve deficit.     Sensory: No sensory deficit.     Motor: No abnormal muscle tone.     Coordination: Coordination normal.     Comments: No dysarthria or aphasia or nystagmus.  No ataxia  Psychiatric:        Mood and Affect: Mood normal.        Behavior: Behavior normal.        Thought Content: Thought content normal.        Judgment: Judgment normal.    ED Results / Procedures / Treatments   Labs (all labs ordered are listed, but only abnormal results are displayed) Labs Reviewed  BASIC METABOLIC PANEL - Abnormal; Notable for the following components:      Result Value   Glucose, Bld 165 (*)    Creatinine, Ser 1.28 (*)    Calcium 8.7 (*)    GFR, Estimated 59 (*)    All other components within normal limits  CBC - Abnormal; Notable for the following components:   RBC 3.96 (*)    HCT 38.5 (*)    All other components within normal limits  URINALYSIS, ROUTINE W REFLEX MICROSCOPIC - Abnormal; Notable for the following components:   Ketones, ur 5 (*)    Protein, ur 30  (*)    All other components within normal limits  CBG MONITORING, ED - Abnormal; Notable for the following components:   Glucose-Capillary 143 (*)    All other components within normal limits  TROPONIN I (HIGH SENSITIVITY)  TROPONIN I (HIGH SENSITIVITY)    EKG EKG Interpretation  Date/Time:  Thursday April 24 2022 16:28:47 EDT Ventricular Rate:  92 PR Interval:  178 QRS Duration: 76 QT Interval:  344 QTC Calculation: 425 R Axis:   6 Text Interpretation: Normal sinus rhythm Normal ECG When compared with ECG of 03-Dec-2021 09:49, No significant change was found Confirmed by Daleen Bo [phone]) on 04/24/2022 4:33:23 PM  Radiology No results found.  Procedures Procedures  {Document cardiac monitor, telemetry assessment procedure when appropriate:1}  Medications Ordered in ED Medications - No data to display  ED Course/ Medical Decision Making/ A&P                           Medical Decision Making Syncope, with prior similar problems.  Suspect vasovagal episode associated with taking medications for high blood pressure.  No injuries from the fall.  Short-term loss of consciousness with recovery of normal mentation afterwards.  Amount and/or Complexity of Data Reviewed Labs: ordered.   ***  {Document critical care time when appropriate:1} {Document review of labs and clinical decision tools ie heart score, Chads2Vasc2 etc:1}  {Document your independent review of radiology images, and any outside records:1} {Document your discussion with family members, caretakers, and with consultants:1} {Document social determinants of health affecting pt's care:1} {Document your decision making why or why not admission, treatments were needed:1} Final Clinical Impression(s) / ED Diagnoses Final diagnoses:  Syncope, unspecified syncope type    Rx / DC Orders ED Discharge Orders  None       

## 2022-04-24 NOTE — Discharge Instructions (Addendum)
It appears that you passed out today because of mild dehydration causing a fainting spell.  To help prevent this, increase your water intake by 3 to 4 glasses every day.  For now continue to take your usual medicines.  Your doctor may need to change either your losartan, or metoprolol, if you continue to be symptomatic with things like near-syncope or syncope.  Your creatinine was elevated today which goes along with the dehydration.  Have your doctor check it next week when you see her.

## 2022-04-24 NOTE — ED Triage Notes (Signed)
Pt was working out in a field and had 2 syncopal episodes, sent from UC for evaluation of syncopal episodes, SOB and lips turning blue.

## 2022-04-28 ENCOUNTER — Encounter: Payer: Self-pay | Admitting: Gastroenterology

## 2022-04-29 ENCOUNTER — Encounter: Payer: Self-pay | Admitting: Internal Medicine

## 2022-04-29 ENCOUNTER — Ambulatory Visit (INDEPENDENT_AMBULATORY_CARE_PROVIDER_SITE_OTHER): Payer: PPO | Admitting: Internal Medicine

## 2022-04-29 ENCOUNTER — Telehealth: Payer: Self-pay | Admitting: Gastroenterology

## 2022-04-29 ENCOUNTER — Encounter: Payer: PPO | Admitting: Gastroenterology

## 2022-04-29 VITALS — BP 130/70 | HR 76 | Temp 98.0°F | Resp 14 | Ht 72.0 in | Wt 200.4 lb

## 2022-04-29 DIAGNOSIS — E1159 Type 2 diabetes mellitus with other circulatory complications: Secondary | ICD-10-CM

## 2022-04-29 DIAGNOSIS — I152 Hypertension secondary to endocrine disorders: Secondary | ICD-10-CM | POA: Diagnosis not present

## 2022-04-29 DIAGNOSIS — I2510 Atherosclerotic heart disease of native coronary artery without angina pectoris: Secondary | ICD-10-CM

## 2022-04-29 DIAGNOSIS — R0989 Other specified symptoms and signs involving the circulatory and respiratory systems: Secondary | ICD-10-CM | POA: Diagnosis not present

## 2022-04-29 DIAGNOSIS — N179 Acute kidney failure, unspecified: Secondary | ICD-10-CM | POA: Diagnosis not present

## 2022-04-29 DIAGNOSIS — R55 Syncope and collapse: Secondary | ICD-10-CM

## 2022-04-29 DIAGNOSIS — Z794 Long term (current) use of insulin: Secondary | ICD-10-CM | POA: Diagnosis not present

## 2022-04-29 DIAGNOSIS — E1165 Type 2 diabetes mellitus with hyperglycemia: Secondary | ICD-10-CM

## 2022-04-29 DIAGNOSIS — I10 Essential (primary) hypertension: Secondary | ICD-10-CM | POA: Diagnosis not present

## 2022-04-29 MED ORDER — LOSARTAN POTASSIUM 25 MG PO TABS: 25.0000 mg | ORAL_TABLET | Freq: Two times a day (BID) | ORAL | 3 refills | Status: DC

## 2022-04-29 MED ORDER — METOPROLOL SUCCINATE ER 25 MG PO TB24
12.5000 mg | ORAL_TABLET | Freq: Every day | ORAL | 3 refills | Status: DC
Start: 2022-04-29 — End: 2022-08-06

## 2022-04-29 NOTE — Progress Notes (Unsigned)
Chief Complaint  Patient presents with   Medication Problem    Seen at ED 6/1 for syncope, ED dr stated there may be side effect to metoprolol,pt still actively taking.    HPI ROS 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 09/29/2008   Diabetes mellitus without complication ([place])    x 01-74 years as of 11/08/18    DIABETIC RETINOPATHY, BACKGROUND 09/29/2008   DYSPHAGIA UNSPECIFIED 09/29/2008   Dysplastic nevus 02/20/2021   L post thigh - mild    ESOPHAGEAL STRICTURE 12/28/2008   GERD 11/28/2008   History of kidney stones    HYPERLIPIDEMIA 08/03/2007   OSTEOARTHRITIS, 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   PAIN IN SOFT TISSUES OF LIMB 01/07/2008   Prostate cancer ([place])    UNSPECIFIED ANEMIA 12/29/2007   Unspecified essential hypertension 01/07/2008   URINARY CALCULUS 09/29/2008   Urine incontinence    Past Surgical History:  Procedure Laterality Date   CHOLECYSTECTOMY N/A 11/12/2016   Procedure: LAPAROSCOPIC CHOLECYSTECTOMY WITH INTRAOPERATIVE CHOLANGIOGRAM;  Surgeon: Clayburn Pert, MD;  Location: [facility] ORS;  Service: General;  Laterality: N/A;   COLONOSCOPY     polyp   dupyretens contracture     right hand surgery,left hand    HAND SURGERY Right    2016   LITHOTRIPSY     POLYPECTOMY     PROSTATE BIOPSY N/A 12/05/2021   Procedure: PROSTATE BIOPSY Vernelle Emerald;  Surgeon: Royston Cowper, MD;  Location: [facility] ORS;  Service: Urology;  Laterality: N/A;   SHOULDER SURGERY Left    left, cyst and tumor removed   URINARY SURGERY  2018   Dr Daiva Eves "stretched" urethra around prostate to increase urine flow   Family History  Problem Relation Age of Onset   Diabetes Mother    Depression Mother    Prostate cancer  Brother    Diabetes Brother    Heart disease Brother    Diabetes Brother    Colon cancer Neg Hx    Esophageal cancer Neg Hx    Rectal cancer Neg Hx    Stomach cancer Neg Hx    Colon polyps Neg Hx    Crohn's disease Neg Hx    Social History   Socioeconomic History   Marital status: Married    Spouse name: Not on file   Number of children: 2   Years of education: Not on file   Highest education level: Not on file  Occupational History   Occupation: Metal Fabrication    Employer: [employer]  Tobacco Use   Smoking status: Never    Passive exposure: Past   Smokeless tobacco: Never  Vaping Use   Vaping Use: Never used  Substance and Sexual Activity   Alcohol use: Yes    Comment: occasional beer   Drug use: No   Sexual activity: Not on file  Other Topics Concern   Not on file  Social History Narrative   Daily Caffeine Use 1-2 daily   Married    Never smoker    Wears seat belt, safe in relationship    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, retired    Investment banker, operational of Radio broadcast assistant Strain: Low Risk    Difficulty of  Paying Living Expenses: Not hard at all  Food Insecurity: No Food Insecurity   Worried About Running Out of Food in the Last Year: Never true   Ran Out of Food in the Last Year: Never true  Transportation Needs: No Transportation Needs   Lack of Transportation (Medical): No   Lack of Transportation (Non-Medical): No  Physical Activity: Insufficiently Active   Days of Exercise per Week: 1 day   Minutes of Exercise per Session: 60 min  Stress: No Stress Concern Present   Feeling of Stress : Not at all  Social Connections: Unknown   Frequency of Communication with Friends and Family: Not on file   Frequency of Social Gatherings with Friends and Family: Not on file   Attends Religious Services: Not on file   Active Member of Clubs or Organizations: Not on file   Attends Banker Meetings: Not on file   Marital Status: Married  Catering manager  Violence: Not At Risk   Fear of Current or Ex-Partner: No   Emotionally Abused: No   Physically Abused: No   Sexually Abused: No   Current Meds  Medication Sig   albuterol (VENTOLIN HFA) 108 (90 Base) MCG/ACT inhaler Inhale 1-2 puffs into the lungs every 6 (six) hours as needed for wheezing or shortness of breath.   Ascorbic Acid (VITAMIN C) 1000 MG tablet Take 500 mg by mouth daily.   aspirin 81 MG tablet Take 81 mg by mouth daily.   atorvastatin (LIPITOR) 20 MG tablet Take 1 tablet (20 mg total) by mouth daily at 6 PM. (Patient taking differently: Take 20 mg by mouth daily.)   azelastine (ASTELIN) 0.1 % nasal spray Place 2 sprays into both nostrils 2 (two) times daily. Use in each nostril as directed   BD PEN NEEDLE NANO 2ND GEN 32G X 4 MM MISC INJECT 1 EACH INTO THE SKIN 4 (FOUR) TIMES DAILY.   calcium carbonate (OSCAL) 1500 (600 Ca) MG TABS tablet Take 600 mg of elemental calcium by mouth daily with breakfast.   Cholecalciferol (VITAMIN D-3) 125 MCG (5000 UT) TABS Take 5,000 Units by mouth daily.   Continuous Blood Gluc Receiver (FREESTYLE LIBRE 2 READER) DEVI Use to check glucose at least TID   Continuous Blood Gluc Sensor (FREESTYLE LIBRE 2 SENSOR) MISC Use to check glucose at least three times daily   Dextromethorphan-Guaifenesin 60-1200 MG 12hr tablet Take 1 tablet by mouth every 12 (twelve) hours.   dutasteride (AVODART) 0.5 MG capsule Take 0.5 mg by mouth daily.   insulin degludec (TRESIBA FLEXTOUCH) 200 UNIT/ML FlexTouch Pen Inject 78 Units into the skin daily. Reduce from 80 units   Lancets (ONETOUCH ULTRASOFT) lancets Use as instructed 2x daily Dx: 250.00   losartan (COZAAR) 50 MG tablet Take 1 tablet (50 mg total) by mouth 2 (two) times daily. Take another pill in afternoon if BP>130/>80. D/c 100 mg qd   MAGNESIUM PO Take 1 tablet by mouth daily.   metFORMIN (GLUCOPHAGE XR) 500 MG 24 hr tablet Take 2 tablets (1,000 mg total) by mouth 2 (two) times daily.   metoprolol succinate  (TOPROL-XL) 25 MG 24 hr tablet TAKE 1 TABLET BY MOUTH EVERY DAY   omeprazole (PRILOSEC) 40 MG capsule Take 1 capsule (40 mg total) by mouth daily.   ONETOUCH ULTRA test strip TEST TWICE A DAY   Potassium 99 MG TABS Take 99 mg by mouth daily.   tamsulosin (FLOMAX) 0.4 MG CAPS capsule Take 0.4 mg by mouth daily.  vitamin E 400 UNIT capsule Take 400 Units by mouth daily.   Current Facility-Administered Medications for the 04/29/22 encounter (Office Visit) with McLean-Scocuzza, Nino Glow, MD  Medication   cyanocobalamin ((VITAMIN B-12)) injection 1,000 mcg   Allergies  Allergen Reactions   Sulfamethoxazole-Trimethoprim Rash   Recent Results (from the past 2160 hour(s))  CBC w/Diff     Status: Abnormal   Collection Time: 02/14/22  8:22 AM  Result Value Ref Range   WBC 6.9 4.0 - 10.5 K/uL   RBC 4.39 4.22 - 5.81 Mil/uL   Hemoglobin 14.7 13.0 - 17.0 g/dL   HCT 42.4 39.0 - 52.0 %   MCV 96.5 78.0 - 100.0 fl   MCHC 34.8 30.0 - 36.0 g/dL   RDW 14.2 11.5 - 15.5 %   Platelets 175.0 150.0 - 400.0 K/uL   Neutrophils Relative % 41.3 (L) 43.0 - 77.0 %   Lymphocytes Relative 30.9 12.0 - 46.0 %   Monocytes Relative 9.2 3.0 - 12.0 %   Eosinophils Relative 17.8 Repeated and verified X2. (H) 0.0 - 5.0 %   Basophils Relative 0.8 0.0 - 3.0 %   Neutro Abs 2.9 1.4 - 7.7 K/uL   Lymphs Abs 2.1 0.7 - 4.0 K/uL   Monocytes Absolute 0.6 0.1 - 1.0 K/uL   Eosinophils Absolute 1.2 (H) 0.0 - 0.7 K/uL   Basophils Absolute 0.1 0.0 - 0.1 K/uL  Lipid Profile     Status: Abnormal   Collection Time: 02/14/22  8:22 AM  Result Value Ref Range   Cholesterol 65 0 - 200 mg/dL    Comment: ATP III Classification       Desirable:  < 200 mg/dL               Borderline High:  200 - 239 mg/dL          High:  > = 240 mg/dL   Triglycerides 92.0 0.0 - 149.0 mg/dL    Comment: Normal:  <150 mg/dLBorderline High:  150 - 199 mg/dL   HDL 31.00 (L) >39.00 mg/dL   VLDL 18.4 0.0 - 40.0 mg/dL   LDL Cholesterol 16 0 - 99 mg/dL   Total  CHOL/HDL Ratio 2     Comment:                Men          Women1/2 Average Risk     3.4          3.3Average Risk          5.0          4.42X Average Risk          9.6          7.13X Average Risk          15.0          11.0                       NonHDL 34.43     Comment: NOTE:  Non-HDL goal should be 30 mg/dL higher than patient's LDL goal (i.e. LDL goal of < 70 mg/dL, would have non-HDL goal of < 100 mg/dL)  Comp Met (CMET)     Status: Abnormal   Collection Time: 02/14/22  8:22 AM  Result Value Ref Range   Sodium 143 135 - 145 mEq/L   Potassium 4.6 3.5 - 5.1 mEq/L   Chloride 106 96 - 112 mEq/L   CO2 32 19 -  32 mEq/L   Glucose, Bld 118 (H) 70 - 99 mg/dL   BUN 17 6 - 23 mg/dL   Creatinine, Ser 1.01 0.40 - 1.50 mg/dL   Total Bilirubin 0.9 0.2 - 1.2 mg/dL   Alkaline Phosphatase 63 39 - 117 U/L   AST 20 0 - 37 U/L   ALT 21 0 - 53 U/L   Total Protein 6.3 6.0 - 8.3 g/dL   Albumin 4.2 3.5 - 5.2 g/dL   GFR 73.65 >60.00 mL/min    Comment: Calculated using the CKD-EPI Creatinine Equation (2021)   Calcium 9.8 8.4 - 10.5 mg/dL  Hemoglobin A1c     Status: Abnormal   Collection Time: 02/14/22  8:22 AM  Result Value Ref Range   Hgb A1c MFr Bld 6.7 (H) 4.6 - 6.5 %    Comment: Glycemic Control Guidelines for People with Diabetes:Non Diabetic:  <6%Goal of Therapy: <7%Additional Action Suggested:  >8%   POCT CBG (manual entry)     Status: Abnormal   Collection Time: 04/24/22  4:09 PM  Result Value Ref Range   POCT Glucose (KUC) 131 (A) 70 - 99 mg/dL  Basic metabolic panel     Status: Abnormal   Collection Time: 04/24/22  4:47 PM  Result Value Ref Range   Sodium 137 135 - 145 mmol/L   Potassium 3.9 3.5 - 5.1 mmol/L   Chloride 107 98 - 111 mmol/L   CO2 23 22 - 32 mmol/L   Glucose, Bld 165 (H) 70 - 99 mg/dL    Comment: Glucose reference range applies only to samples taken after fasting for at least 8 hours.   BUN 22 8 - 23 mg/dL   Creatinine, Ser 1.28 (H) 0.61 - 1.24 mg/dL   Calcium 8.7 (L) 8.9 -  10.3 mg/dL   GFR, Estimated 59 (L) >60 mL/min    Comment: (NOTE) Calculated using the CKD-EPI Creatinine Equation (2021)    Anion gap 7 5 - 15    Comment: Performed at [facility], [address], [place], [place] [address]  CBC     Status: Abnormal   Collection Time: 04/24/22  4:47 PM  Result Value Ref Range   WBC 9.7 4.0 - 10.5 K/uL   RBC 3.96 (L) 4.22 - 5.81 MIL/uL   Hemoglobin 13.4 13.0 - 17.0 g/dL   HCT 38.5 (L) 39.0 - 52.0 %   MCV 97.2 80.0 - 100.0 fL   MCH 33.8 26.0 - 34.0 pg   MCHC 34.8 30.0 - 36.0 g/dL   RDW 13.2 11.5 - 15.5 %   Platelets 183 150 - 400 K/uL   nRBC 0.0 0.0 - 0.2 %    Comment: Performed at [facility], [address], [place], [place] [address]  CBG monitoring, ED     Status: Abnormal   Collection Time: 04/24/22  4:48 PM  Result Value Ref Range   Glucose-Capillary 143 (H) 70 - 99 mg/dL    Comment: Glucose reference range applies only to samples taken after fasting for at least 8 hours.  Urinalysis, Routine w reflex microscopic Urine, Clean Catch     Status: Abnormal   Collection Time: 04/24/22  5:13 PM  Result Value Ref Range   Color, Urine YELLOW YELLOW   APPearance CLEAR CLEAR   Specific Gravity, Urine 1.021 1.005 - 1.030   pH 5.0 5.0 - 8.0   Glucose, UA NEGATIVE NEGATIVE mg/dL   Hgb urine dipstick NEGATIVE NEGATIVE   Bilirubin Urine NEGATIVE NEGATIVE   Ketones, ur  5 (A) NEGATIVE mg/dL   Protein, ur 30 (A) NEGATIVE mg/dL   Nitrite NEGATIVE NEGATIVE   Leukocytes,Ua NEGATIVE NEGATIVE   RBC / HPF 0-5 0 - 5 RBC/hpf   WBC, UA 0-5 0 - 5 WBC/hpf   Bacteria, UA NONE SEEN NONE SEEN   Squamous Epithelial / LPF 0-5 0 - 5   Mucus PRESENT    Hyaline Casts, UA PRESENT     Comment: Performed at [facility], [address] [place], [place] [address]  Troponin I (High Sensitivity)     Status: None   Collection Time: 04/24/22  5:56 PM  Result Value Ref Range   Troponin I (High Sensitivity) 2 <18 ng/L    Comment: (NOTE) Elevated high sensitivity troponin I  (hsTnI) values and significant  changes across serial measurements may suggest ACS but many other  chronic and acute conditions are known to elevate hsTnI results.  Refer to the "Links" section for chest pain algorithms and additional  guidance. Performed at [facility], [address] [place], [place] [address]   Troponin I (High Sensitivity)     Status: None   Collection Time: 04/24/22  7:33 PM  Result Value Ref Range   Troponin I (High Sensitivity) 2 <18 ng/L    Comment: (NOTE) Elevated high sensitivity troponin I (hsTnI) values and significant  changes across serial measurements may suggest ACS but many other  chronic and acute conditions are known to elevate hsTnI results.  Refer to the "Links" section for chest pain algorithms and additional  guidance. Performed at [facility], [address], [place],  [address]    Objective  Body mass index is 27.18 kg/m. Wt Readings from Last 3 Encounters:  04/29/22 200 lb 6.4 oz (90.9 kg)  04/24/22 194 lb (88 kg)  04/08/22 200 lb (90.7 kg)   Temp Readings from Last 3 Encounters:  04/29/22 98 F (36.7 C) (Oral)  04/24/22 97.6 F (36.4 C) (Oral)  04/24/22 97.8 F (36.6 C) (Oral)   BP Readings from Last 3 Encounters:  04/29/22 130/70  04/24/22 (!) 149/68  04/24/22 135/69   Pulse Readings from Last 3 Encounters:  04/29/22 76  04/24/22 90  04/24/22 99    Physical Exam  Assessment  Plan  No diagnosis found.  HM Flu shot utd Tdap utd 08/31/18  prevnar and pna 23 utd  Consider shingrix vaccine in future ordered to pharmacy x 2 doses covid 19 vx moderna 4/4 consider 5th shot   Hep A immune, consider hep B new vaccine x 2 doses in future with h/o fatty liver  Hep C negative 07/09/16    PSA-4.26 elevated 10/01/21 established Dr. Rogers Blocker will see 5 or 04/2022 Also h/o kidney stones and BPH, ED Rx Cialis 20 mg #4 RF x 6  PSA 05/29/20 3.32 09/03/20 PSA 3.2     -MRI ab w/w/o had 11/01/2019 bosniak 2  hemmorrhagic renal  cyst left kidney likely benign, bosniak 2 lower right kidney and multiple small bosniak 1 renal cysts b/l kidneys  MRI 11/05/21 prostate  IMPRESSION: 1. Lesion in the LEFT lateral base concerning for high-grade prostate carcinoma. PI-RADS: 5. (Dynacad 3D post processing performed). ROI #1. 2. Concern for subtle transscapular spread (1-2 mm) along the anterior LEFT base / mid gland. 3. Enlarged nodular transitional zone most consistent benign prostate hypertrophy PI-RADS: 2     Electronically Signed   By: Suzy Bouchard M.D.   On: 11/06/2021 14:30 Never smoker except for cigars with COPD noted on  imaging, consider pfts in future    GI Dr. Fuller Plan 01/24/2019 5 polyps tubular repeat in 3 years  -he knows and will call to schedule   Dermatology established with [facility] last seen 12/02/17  tbse referred [place] skin appt 06/26/22    Dr. Raliegh Ip cards appt 08/2019 had stress test not pfts yet [place] [facility]    Pharmacy [facility] insulin  otherwise [facility]    Rec healthy diet and exercise  Provider: Dr. Olivia Mackie McLean-Scocuzza-Internal Medicine

## 2022-04-29 NOTE — Telephone Encounter (Signed)
Good Morning Dr. Fuller Plan,  Patients son called stating that patient was not going to be able to come in for his procedure today at 2:010 due to patient passing out and being rushed to the ER. Patients son stated he was sorry and that they would call back when they could to get rescheduled.

## 2022-04-29 NOTE — Patient Instructions (Addendum)
Dr. Drema Dallas   [phone] Not available [address]   [facility]   [place] [address]        Phone Fax E-mail Address  [phone] [email or web address] [address] [address]     Specialties     Gastroenterology              Syncope, Adult  Syncope refers to a condition in which a person temporarily loses consciousness. Syncope may also be called fainting or passing out. It is caused by a sudden decrease in blood flow to the brain. This can happen for a variety of reasons. Most causes of syncope are not dangerous. It can be triggered by things such as needle sticks, seeing blood, pain, or intense emotion. However, syncope can also be a sign of a serious medical problem, such as a heart abnormality. Other causes can include dehydration, migraines, or taking medicines that lower blood pressure. Your health care provider may do tests to find the reason why you are having syncope. If you faint, get medical help right away. Call your local emergency services (911 in the [place] Follow these instructions at home: Pay attention to any changes in your symptoms. Take these actions to stay safe and to help relieve your symptoms: Knowing when you may be about to faint Signs that you may be about to faint include: Feeling dizzy, weak, light-headed, or like the room is spinning. Feeling nauseous. Seeing spots or seeing all white or all black in your field of vision. Having cold, clammy skin or feeling warm and sweaty. Hearing ringing in the ears (tinnitus). If you start to feel like you might faint, sit or lie down right away. If sitting, put your head down between your legs. If lying down, raise (elevate) your feet above the level of your heart. Breathe deeply and steadily. Wait until all the symptoms have passed. Have someone stay with you until you feel stable. Medicines Take over-the-counter and  prescription medicines only as told by your health care provider. If you are taking blood pressure or heart medicine, get up slowly and take several minutes to sit and then stand. This can reduce dizziness and decrease the risk of syncope. Lifestyle Do not drive, use machinery, or play sports until your health care provider says it is okay. Do not drink alcohol. Do not use any products that contain nicotine or tobacco. These products include cigarettes, chewing tobacco, and vaping devices, such as e-cigarettes. If you need help quitting, ask your health care provider. Avoid hot tubs and saunas. General instructions Talk with your health care provider about your symptoms. You may need to have testing to understand the cause of your syncope. Drink enough fluid to keep your urine pale yellow. Avoid prolonged standing. If you must stand for a long time, do movements such as: Moving your legs. Crossing your legs. Flexing and stretching your leg muscles. Squatting. Keep all follow-up visits. This is important. Contact a health care provider if: You have episodes of near fainting. Get help right away if: You faint. You hit your head or are injured after fainting. You have any of these symptoms that may indicate trouble with your heart: Fast or irregular heartbeats (palpitations). Unusual pain in your chest, abdomen, or back. Shortness of breath. You have a seizure. You have a severe headache. You are confused. You have vision problems. You have severe weakness or trouble walking. You are bleeding from your mouth or  rectum, or you have black or tarry stool. These symptoms may represent a serious problem that is an emergency. Do not wait to see if your symptoms will go away. Get medical help right away. Call your local emergency services (911 in the [place] Do not drive yourself to the hospital. Summary Syncope refers to a condition in which a person temporarily loses consciousness. Syncope  may also be called fainting or passing out. It is caused by a sudden decrease in blood flow to the brain. Signs that you may be about to faint include dizziness, feeling light-headed, feeling nauseous, sudden vision changes, or cold, clammy skin. Even though most causes of syncope are not dangerous, syncope can be a sign of a serious medical problem. Get help right away if you faint. If you start to feel like you might faint, sit or lie down right away. If sitting, put your head down between your legs. If lying down, raise (elevate) your feet above the level of your heart. This information is not intended to replace advice given to you by your health care provider. Make sure you discuss any questions you have with your health care provider. Document Revised: 03/21/2021 Document Reviewed: 03/21/2021 Elsevier Patient Education  [place].

## 2022-04-30 MED ORDER — BLOOD PRESSURE KIT: 1.0000 | PACK | Freq: Every day | 0 refills | Status: DC

## 2022-05-01 ENCOUNTER — Other Ambulatory Visit: Payer: Self-pay | Admitting: Internal Medicine

## 2022-05-01 DIAGNOSIS — E1165 Type 2 diabetes mellitus with hyperglycemia: Secondary | ICD-10-CM

## 2022-05-21 ENCOUNTER — Encounter: Payer: Self-pay | Admitting: Internal Medicine

## 2022-05-23 ENCOUNTER — Other Ambulatory Visit: Payer: Self-pay | Admitting: Internal Medicine

## 2022-05-23 DIAGNOSIS — Z794 Long term (current) use of insulin: Secondary | ICD-10-CM

## 2022-06-04 DIAGNOSIS — I10 Essential (primary) hypertension: Secondary | ICD-10-CM | POA: Diagnosis not present

## 2022-06-04 DIAGNOSIS — R55 Syncope and collapse: Secondary | ICD-10-CM | POA: Diagnosis not present

## 2022-06-04 DIAGNOSIS — I25118 Atherosclerotic heart disease of native coronary artery with other forms of angina pectoris: Secondary | ICD-10-CM | POA: Diagnosis not present

## 2022-06-14 ENCOUNTER — Other Ambulatory Visit: Payer: Self-pay | Admitting: Internal Medicine

## 2022-06-19 ENCOUNTER — Other Ambulatory Visit: Payer: Self-pay | Admitting: Internal Medicine

## 2022-06-19 DIAGNOSIS — I25118 Atherosclerotic heart disease of native coronary artery with other forms of angina pectoris: Secondary | ICD-10-CM | POA: Diagnosis not present

## 2022-06-19 DIAGNOSIS — R55 Syncope and collapse: Secondary | ICD-10-CM | POA: Diagnosis not present

## 2022-06-19 DIAGNOSIS — E785 Hyperlipidemia, unspecified: Secondary | ICD-10-CM

## 2022-06-24 ENCOUNTER — Other Ambulatory Visit (INDEPENDENT_AMBULATORY_CARE_PROVIDER_SITE_OTHER): Payer: PPO

## 2022-06-24 DIAGNOSIS — E1159 Type 2 diabetes mellitus with other circulatory complications: Secondary | ICD-10-CM

## 2022-06-24 DIAGNOSIS — N179 Acute kidney failure, unspecified: Secondary | ICD-10-CM | POA: Diagnosis not present

## 2022-06-24 DIAGNOSIS — I152 Hypertension secondary to endocrine disorders: Secondary | ICD-10-CM | POA: Diagnosis not present

## 2022-06-24 LAB — BASIC METABOLIC PANEL
BUN: 22 mg/dL (ref 6–23)
CO2: 25 mEq/L (ref 19–32)
Calcium: 9.2 mg/dL (ref 8.4–10.5)
Chloride: 104 mEq/L (ref 96–112)
Creatinine, Ser: 1.17 mg/dL (ref 0.40–1.50)
GFR: 61.58 mL/min (ref >60.00)
Glucose, Bld: 172 mg/dL — ABNORMAL HIGH (ref 70–99)
Potassium: 4.3 mEq/L (ref 3.5–5.1)
Sodium: 139 mEq/L (ref 135–145)

## 2022-06-24 LAB — HEMOGLOBIN A1C: Hgb A1c MFr Bld: 7.3 % — ABNORMAL HIGH (ref 4.6–6.5)

## 2022-06-26 ENCOUNTER — Ambulatory Visit: Payer: PPO | Admitting: Dermatology

## 2022-06-26 DIAGNOSIS — L821 Other seborrheic keratosis: Secondary | ICD-10-CM | POA: Diagnosis not present

## 2022-06-26 DIAGNOSIS — D2371 Other benign neoplasm of skin of right lower limb, including hip: Secondary | ICD-10-CM

## 2022-06-26 DIAGNOSIS — D2372 Other benign neoplasm of skin of left lower limb, including hip: Secondary | ICD-10-CM

## 2022-06-26 DIAGNOSIS — D229 Melanocytic nevi, unspecified: Secondary | ICD-10-CM

## 2022-06-26 DIAGNOSIS — L570 Actinic keratosis: Secondary | ICD-10-CM | POA: Diagnosis not present

## 2022-06-26 DIAGNOSIS — Z1283 Encounter for screening for malignant neoplasm of skin: Secondary | ICD-10-CM

## 2022-06-26 DIAGNOSIS — Z86018 Personal history of other benign neoplasm: Secondary | ICD-10-CM

## 2022-06-26 DIAGNOSIS — D1800 Hemangioma unspecified site: Secondary | ICD-10-CM

## 2022-06-26 DIAGNOSIS — L814 Other melanin hyperpigmentation: Secondary | ICD-10-CM | POA: Diagnosis not present

## 2022-06-26 DIAGNOSIS — L578 Other skin changes due to chronic exposure to nonionizing radiation: Secondary | ICD-10-CM

## 2022-06-26 NOTE — Progress Notes (Signed)
Follow-Up Visit   Subjective  [name] is a 76 y.o. male who presents for the following: Annual Exam (Hx dysplastic - L post thigh, hx AK's ). Patient has lesions on his face that he would like checked today. He states they were treated in the past but he has since grown new ones.   The following portions of the chart were reviewed this encounter and updated as appropriate:   Tobacco  Allergies  Meds  Problems  Med Hx  Surg Hx  Fam Hx      Review of Systems:  No other skin or systemic complaints except as noted in HPI or Assessment and Plan.  Objective  Well appearing patient in no apparent distress; mood and affect are within normal limits.  A full examination was performed including scalp, head, eyes, ears, nose, lips, neck, chest, axillae, abdomen, back, buttocks, bilateral upper extremities, bilateral lower extremities, hands, feet, fingers, toes, fingernails, and toenails. All findings within normal limits unless otherwise noted below.  L brow x 1, L temple x 1 (2) Erythematous thin papules/macules with gritty scale.     Assessment & Plan  AK (actinic keratosis) (2) L brow x 1, L temple x 1  Destruction of lesion - L brow x 1, L temple x 1 Complexity: simple   Destruction method: cryotherapy   Informed consent: discussed and consent obtained   Timeout:  patient name, date of birth, surgical site, and procedure verified Lesion destroyed using liquid nitrogen: Yes   Region frozen until ice Branan extended beyond lesion: Yes   Outcome: patient tolerated procedure well with no complications   Post-procedure details: wound care instructions given   Additional details:  Prior to procedure, discussed risks of blister formation, small wound, skin dyspigmentation, or rare scar following cryotherapy. Recommend Vaseline ointment to treated areas while healing.    Lentigines - Scattered tan macules - Due to sun exposure - Benign-appearing, observe - Recommend daily  broad spectrum sunscreen SPF 30+ to sun-exposed areas, reapply every 2 hours as needed. - Call for any changes  Seborrheic Keratoses - Stuck-on, waxy, tan-brown papules and/or plaques  - Benign-appearing - Discussed benign etiology and prognosis. - Observe - Call for any changes  Melanocytic Nevi - Tan-brown and/or pink-flesh-colored symmetric macules and papules - Benign appearing on exam today - Observation - Call clinic for new or changing moles - Recommend daily use of broad spectrum spf 30+ sunscreen to sun-exposed areas.   Hemangiomas - Red papules - Discussed benign nature - Observe - Call for any changes  Actinic Damage - Chronic condition, secondary to cumulative UV/sun exposure - diffuse scaly erythematous macules with underlying dyspigmentation - Recommend daily broad spectrum sunscreen SPF 30+ to sun-exposed areas, reapply every 2 hours as needed.  - Staying in the shade or wearing long sleeves, sun glasses (UVA+UVB protection) and wide brim hats (4-inch brim around the entire circumference of the hat) are also recommended for sun protection.  - Call for new or changing lesions.  History of Dysplastic Nevus - L post thigh  - No evidence of recurrence today - Recommend regular full body skin exams - Recommend daily broad spectrum sunscreen SPF 30+ to sun-exposed areas, reapply every 2 hours as needed.  - Call if any new or changing lesions are noted between office visits  Dermatofibroma - L lat thigh, R lat calf - Firm pink/brown papulenodule with dimple sign - Benign appearing - Call for any changes  Skin cancer screening performed today.  Return in about 1 year (around 06/27/2023) for TBSE, 6-12 weeks for AK follow up .  Luther Redo, CMA, am acting as scribe for Forest Gleason, MD .   Documentation: I have reviewed the above documentation for accuracy and completeness, and I agree with the above.  Forest Gleason, MD

## 2022-06-26 NOTE — Patient Instructions (Addendum)

## 2022-06-27 ENCOUNTER — Encounter: Payer: Self-pay | Admitting: Internal Medicine

## 2022-06-27 ENCOUNTER — Ambulatory Visit (INDEPENDENT_AMBULATORY_CARE_PROVIDER_SITE_OTHER): Payer: PPO | Admitting: Internal Medicine

## 2022-06-27 VITALS — BP 118/60 | HR 78 | Temp 97.7°F | Ht 72.0 in | Wt 189.2 lb

## 2022-06-27 DIAGNOSIS — E1159 Type 2 diabetes mellitus with other circulatory complications: Secondary | ICD-10-CM | POA: Diagnosis not present

## 2022-06-27 DIAGNOSIS — R634 Abnormal weight loss: Secondary | ICD-10-CM | POA: Diagnosis not present

## 2022-06-27 DIAGNOSIS — M79675 Pain in left toe(s): Secondary | ICD-10-CM | POA: Diagnosis not present

## 2022-06-27 DIAGNOSIS — J439 Emphysema, unspecified: Secondary | ICD-10-CM | POA: Diagnosis not present

## 2022-06-27 DIAGNOSIS — E785 Hyperlipidemia, unspecified: Secondary | ICD-10-CM

## 2022-06-27 DIAGNOSIS — E1165 Type 2 diabetes mellitus with hyperglycemia: Secondary | ICD-10-CM

## 2022-06-27 DIAGNOSIS — I10 Essential (primary) hypertension: Secondary | ICD-10-CM | POA: Diagnosis not present

## 2022-06-27 DIAGNOSIS — Z794 Long term (current) use of insulin: Secondary | ICD-10-CM | POA: Diagnosis not present

## 2022-06-27 DIAGNOSIS — I2510 Atherosclerotic heart disease of native coronary artery without angina pectoris: Secondary | ICD-10-CM | POA: Diagnosis not present

## 2022-06-27 DIAGNOSIS — Z1211 Encounter for screening for malignant neoplasm of colon: Secondary | ICD-10-CM | POA: Diagnosis not present

## 2022-06-27 DIAGNOSIS — Z23 Encounter for immunization: Secondary | ICD-10-CM | POA: Diagnosis not present

## 2022-06-27 DIAGNOSIS — I152 Hypertension secondary to endocrine disorders: Secondary | ICD-10-CM

## 2022-06-27 MED ORDER — ONETOUCH ULTRASOFT LANCETS MISC: 12 refills | Status: DC

## 2022-06-27 MED ORDER — SHINGRIX 50 MCG/0.5ML IM SUSR: 0.5000 mL | Freq: Once | INTRAMUSCULAR | 1 refills | Status: AC

## 2022-06-27 MED ORDER — GLUCOSE BLOOD VI STRP: ORAL_STRIP | 12 refills | Status: DC

## 2022-06-27 NOTE — Progress Notes (Signed)
Chief Complaint  Patient presents with   Follow-up   F/u  1. No further syncopal episodes 06/19/22 stress test normal except mild valve regurgitation and ekg normal [facility] cardiology  2. DM 2 a1c 7.3 still having lows he is not taking ozempic 0.25 x 2 months and on tresiba 78 units and metformin xr 500 2 tabs bid though he is missing some pills at times  3. Wt loss 11 lbs in 2 months still needs to f/u with GI and urology will call for GI appt   Review of Systems  Constitutional:  Negative for weight loss.  HENT:  Negative for hearing loss.   Eyes:  Negative for blurred vision.  Respiratory:  Negative for shortness of breath.   Cardiovascular:  Negative for chest pain.  Gastrointestinal:  Negative for abdominal pain and blood in stool.  Genitourinary:  Negative for dysuria.  Musculoskeletal:  Negative for back pain, falls and joint pain.  Skin:  Negative for rash.  Neurological:  Negative for headaches.  Psychiatric/Behavioral:  Negative for depression.    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 09/29/2008   Diabetes mellitus without complication ([place])    x 33-82 years as of 11/08/18    DIABETIC RETINOPATHY, BACKGROUND 09/29/2008   DYSPHAGIA UNSPECIFIED 09/29/2008   Dysplastic nevus 02/20/2021   L post thigh - mild    ESOPHAGEAL STRICTURE 12/28/2008   GERD 11/28/2008   History of kidney stones    HYPERLIPIDEMIA 08/03/2007   OSTEOARTHRITIS, 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   PAIN IN SOFT TISSUES OF LIMB 01/07/2008   Prostate cancer ([place])    UNSPECIFIED ANEMIA 12/29/2007   Unspecified essential hypertension 01/07/2008   URINARY CALCULUS 09/29/2008   Urine incontinence    Past Surgical History:  Procedure  Laterality Date   CHOLECYSTECTOMY N/A 11/12/2016   Procedure: LAPAROSCOPIC CHOLECYSTECTOMY WITH INTRAOPERATIVE CHOLANGIOGRAM;  Surgeon: Clayburn Pert, MD;  Location: [facility] ORS;  Service: General;  Laterality: N/A;   COLONOSCOPY     polyp   dupyretens contracture     right hand surgery,left hand    HAND SURGERY Right    2016   LITHOTRIPSY     POLYPECTOMY     PROSTATE BIOPSY N/A 12/05/2021   Procedure: PROSTATE BIOPSY Vernelle Emerald;  Surgeon: Royston Cowper, MD;  Location: [facility] ORS;  Service: Urology;  Laterality: N/A;   SHOULDER SURGERY Left    left, cyst and tumor removed   URINARY SURGERY  2018   Dr Daiva Eves "stretched" urethra around prostate to increase urine flow   Family History  Problem Relation Age of Onset   Diabetes Mother    Depression Mother    Prostate cancer Brother    Diabetes Brother    Heart disease Brother    Diabetes Brother    Colon cancer Neg Hx    Esophageal cancer Neg Hx    Rectal cancer Neg Hx    Stomach cancer Neg Hx    Colon polyps Neg Hx    Crohn's disease Neg Hx    Social History   Socioeconomic History   Marital status: Married    Spouse name: Not on file   Number of children: 2   Years  of education: Not on file   Highest education level: Not on file  Occupational History   Occupation: Metal Fabrication    Employer: [employer]  Tobacco Use   Smoking status: Never    Passive exposure: Past   Smokeless tobacco: Never  Vaping Use   Vaping Use: Never used  Substance and Sexual Activity   Alcohol use: Yes    Comment: occasional beer   Drug use: No   Sexual activity: Not on file  Other Topics Concern   Not on file  Social History Narrative   Daily Caffeine Use 1-2 daily   Married    Never smoker    Wears seat belt, safe in relationship    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, retired    International aid/development worker of Corporate investment banker Strain: Low Risk  (12/25/2021)   Overall Financial Resource Strain (CARDIA)    Difficulty of Paying Living Expenses: Not  hard at all  Food Insecurity: No Food Insecurity (12/02/2021)   Hunger Vital Sign    Worried About Running Out of Food in the Last Year: Never true    Ran Out of Food in the Last Year: Never true  Transportation Needs: No Transportation Needs (12/02/2021)   PRAPARE - Administrator, Civil Service (Medical): No    Lack of Transportation (Non-Medical): No  Physical Activity: Insufficiently Active (12/02/2021)   Exercise Vital Sign    Days of Exercise per Week: 1 day    Minutes of Exercise per Session: 60 min  Stress: No Stress Concern Present (12/02/2021)   [employer] of Occupational Health - Occupational Stress Questionnaire    Feeling of Stress : Not at all  Social Connections: Unknown (12/02/2021)   Social Connection and Isolation Panel [NHANES]    Frequency of Communication with Friends and Family: Not on file    Frequency of Social Gatherings with Friends and Family: Not on file    Attends Religious Services: Not on file    Active Member of Clubs or Organizations: Not on file    Attends Banker Meetings: Not on file    Marital Status: Married  Intimate Partner Violence: Not At Risk (12/02/2021)   Humiliation, Afraid, Rape, and Kick questionnaire    Fear of Current or Ex-Partner: No    Emotionally Abused: No    Physically Abused: No    Sexually Abused: No   Current Meds  Medication Sig   Ascorbic Acid (VITAMIN C) 1000 MG tablet Take 500 mg by mouth daily.   aspirin 81 MG tablet Take 81 mg by mouth daily.   azelastine (ASTELIN) 0.1 % nasal spray Place 2 sprays into both nostrils 2 (two) times daily. Use in each nostril as directed   BD PEN NEEDLE NANO 2ND GEN 32G X 4 MM MISC INJECT 1 EACH INTO THE SKIN 4 (FOUR) TIMES DAILY.   Blood Pressure KIT 1 kit by Does not apply route daily. Adult large   calcium carbonate (OSCAL) 1500 (600 Ca) MG TABS tablet Take 600 mg of elemental calcium by mouth daily with breakfast.   Cholecalciferol (VITAMIN D-3) 125 MCG (5000  UT) TABS Take 5,000 Units by mouth daily.   Dextromethorphan-Guaifenesin 60-1200 MG 12hr tablet Take 1 tablet by mouth every 12 (twelve) hours.   dutasteride (AVODART) 0.5 MG capsule Take 0.5 mg by mouth daily.   glucose blood test strip Bid Use as instructed E11.59   Lancets (ONETOUCH ULTRASOFT) lancets Use as instructed 2x daily Dx: 250.00  Lancets (ONETOUCH ULTRASOFT) lancets Bid Use as instructed   MAGNESIUM PO Take 1 tablet by mouth daily.   ONETOUCH ULTRA test strip TEST TWICE A DAY   Potassium 99 MG TABS Take 99 mg by mouth daily.   tamsulosin (FLOMAX) 0.4 MG CAPS capsule Take 0.4 mg by mouth daily.   vitamin E 400 UNIT capsule Take 400 Units by mouth daily.   [EXPIRED] Zoster Vaccine Adjuvanted [facility]) injection Inject 0.5 mLs into the muscle once for 1 dose. 2nd dose 2 to < 6 months of 1st   [DISCONTINUED] albuterol (VENTOLIN HFA) 108 (90 Base) MCG/ACT inhaler Inhale 1-2 puffs into the lungs every 6 (six) hours as needed for wheezing or shortness of breath.   [DISCONTINUED] atorvastatin (LIPITOR) 20 MG tablet Take 1 tablet (20 mg total) by mouth daily.   [DISCONTINUED] Continuous Blood Gluc Receiver (FREESTYLE LIBRE 2 READER) DEVI Use to check glucose at least TID   [DISCONTINUED] Continuous Blood Gluc Sensor (FREESTYLE LIBRE 2 SENSOR) MISC USE TO CHECK GLUCOSE AT LEAST THREE TIMES DAILY   [DISCONTINUED] insulin degludec (TRESIBA FLEXTOUCH) 200 UNIT/ML FlexTouch Pen Inject 78 Units into the skin daily. Reduce from 80 units   [DISCONTINUED] losartan (COZAAR) 25 MG tablet Take 1 tablet (25 mg total) by mouth 2 (two) times daily. Take another pill in afternoon if BP>130/>80. D/c 150 mg qd   [DISCONTINUED] metFORMIN (GLUCOPHAGE-XR) 500 MG 24 hr tablet TAKE 2 TABLETS BY MOUTH TWICE A DAY   [DISCONTINUED] metoprolol succinate (TOPROL-XL) 25 MG 24 hr tablet Take 0.5 tablets (12.5 mg total) by mouth daily. D/c 25   [DISCONTINUED] omeprazole (PRILOSEC) 40 MG capsule Take 1 capsule (40 mg  total) by mouth daily.   Current Facility-Administered Medications for the 06/27/22 encounter (Office Visit) with McLean-Scocuzza, Nino Glow, MD  Medication   cyanocobalamin ((VITAMIN B-12)) injection 1,000 mcg   Allergies  Allergen Reactions   Sulfamethoxazole-Trimethoprim Rash   Recent Results (from the past 2160 hour(s))  Hemoglobin A1c     Status: Abnormal   Collection Time: 06/24/22 11:13 AM  Result Value Ref Range   Hgb A1c MFr Bld 7.3 (H) 4.6 - 6.5 %    Comment: Glycemic Control Guidelines for People with Diabetes:Non Diabetic:  <6%Goal of Therapy: <7%Additional Action Suggested:  >1%   Basic Metabolic Panel (BMET)     Status: Abnormal   Collection Time: 06/24/22 11:13 AM  Result Value Ref Range   Sodium 139 135 - 145 mEq/L   Potassium 4.3 3.5 - 5.1 mEq/L   Chloride 104 96 - 112 mEq/L   CO2 25 19 - 32 mEq/L   Glucose, Bld 172 (H) 70 - 99 mg/dL   BUN 22 6 - 23 mg/dL   Creatinine, Ser 1.17 0.40 - 1.50 mg/dL   GFR 61.58 >60.00 mL/min    Comment: Calculated using the CKD-EPI Creatinine Equation (2021)   Calcium 9.2 8.4 - 10.5 mg/dL   Objective  Body mass index is 25.66 kg/m. Wt Readings from Last 3 Encounters:  06/27/22 189 lb 3.2 oz (85.8 kg)  04/29/22 200 lb 6.4 oz (90.9 kg)  04/24/22 194 lb (88 kg)   Temp Readings from Last 3 Encounters:  06/27/22 97.7 F (36.5 C) (Oral)  04/29/22 98 F (36.7 C) (Oral)  04/24/22 97.6 F (36.4 C) (Oral)   BP Readings from Last 3 Encounters:  06/27/22 118/60  04/29/22 130/70  04/24/22 (!) 149/68   Pulse Readings from Last 3 Encounters:  06/27/22 78  04/29/22 76  04/24/22 90  Physical Exam Vitals and nursing note reviewed.  Constitutional:      Appearance: Normal appearance. He is well-developed and well-groomed.  HENT:     Head: Normocephalic and atraumatic.  Eyes:     Conjunctiva/sclera: Conjunctivae normal.     Pupils: Pupils are equal, round, and reactive to light.  Cardiovascular:     Rate and Rhythm: Normal  rate and regular rhythm.     Heart sounds: Normal heart sounds.  Pulmonary:     Effort: Pulmonary effort is normal. No respiratory distress.     Breath sounds: Normal breath sounds.  Abdominal:     Tenderness: There is no abdominal tenderness.  Skin:    General: Skin is warm and moist.  Neurological:     General: No focal deficit present.     Mental Status: He is alert and oriented to person, place, and time. Mental status is at baseline.     Sensory: Sensation is intact.     Motor: Motor function is intact.     Coordination: Coordination is intact.     Gait: Gait is intact. Gait normal.  Psychiatric:        Attention and Perception: Attention and perception normal.        Mood and Affect: Mood and affect normal.        Speech: Speech normal.        Behavior: Behavior normal. Behavior is cooperative.        Thought Content: Thought content normal.        Cognition and Memory: Cognition and memory normal.        Judgment: Judgment normal.     Assessment  Plan  Hypertension controlled associated with diabetes 7.3 06/24/22  - Plan: Lancets (ONETOUCH ULTRASOFT) lancets, glucose blood test strip he is not taking ozempic 0.25 x 2 months and on tresiba 78 units and metformin xr 500 2 tabs bid though he is missing some pills at times  Lipitor 20 and  Toprol 12.5 mg qd  Losartan 25 mg bid take qd and bid if BP >130/>80   Weight loss - Plan: Ambulatory referral to Gastroenterology due for screening colonoscopy   Toe pain, left - Plan: Ambulatory referral to Podiatry   Pulmonary emphysema, unspecified emphysema type ([place]) - Plan: albuterol (VENTOLIN HFA) 108 (90 Base) MCG/ACT inhaler  Hyperlipidemia, unspecified hyperlipidemia type - Plan: atorvastatin (LIPITOR) 20 MG tablet   Coronary artery disease involving native coronary artery of native heart without angina pectoris - Plan: metoprolol succinate (TOPROL-XL) 12.5MG  24 hr tablet  HM Flu shot utd Tdap utd 08/31/18  prevnar and  pna 23 utd  Prevnar 20 x 1 today 06/27/22  Consider shingrix vaccine given Rx today   covid 19 vx moderna 4/4 consider 5th shot   Hep A immune, consider hep B new vaccine x 2 doses in future with h/o fatty liver  Hep C negative 07/09/16    PSA-4.26 elevated 10/01/21 established Dr. 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 Lesion in the LEFT lateral base concerning for high-grade prostate carcinoma. PI-RADS: 5. (Dynacad  3D post processing performed). ROI #1. 2. Concern for subtle transscapular spread (1-2 mm) along the anterior LEFT base / mid gland. 3. Enlarged nodular transitional zone most consistent benign prostate hypertrophy PI-RADS: 2     Electronically Signed   By: Suzy Bouchard M.D.   On: 11/06/2021 14:30 Never smoker except for cigars with COPD noted on imaging, consider pfts in future    GI Dr. Fuller Plan 01/24/2019 5 polyps tubular repeat in 3 years  -colonoscopy due NOW referral placed   Dermatology established with [facility] last seen 12/02/17  tbse referred [place] skin appt 06/26/22    Dr. Raliegh Ip cards appt 08/2019 had stress test not pfts yet [place] eye    Pharmacy [facility] insulin  otherwise [facility] [place]    Rec healthy diet and exercise      Provider: Dr. Olivia Mackie McLean-Scocuzza-Internal Medicine

## 2022-06-27 NOTE — Patient Instructions (Addendum)
Dr. Forde Dandy    Consider another covid shot    09/05/2022 my last day  Given prevnar 58   Consider vascular specialist to check the circulation in your legs in the future as well   Podiatry  Phone Fax E-mail Address  [phone] Not available [address] [address]    Dr. Justin Mend  Phone Fax E-mail Address  [phone] [email or web address] [address] [place] [address]     Specialties     Gastroenterology            06/19/22 INTERPRETATION  Normal Stress Echocardiogram  NORMAL RIGHT VENTRICULAR SYSTOLIC FUNCTION  MILD VALVULAR REGURGITATION (See above)  NO VALVULAR STENOSIS NOTED   This result has an attachment that is not available.  Normal sinus rhythm  Normal ECG  When compared with ECG of 03-Jan-2019 12:58,  No significant change was found  I reviewed and concur with this report. Electronically signed [id number] MD, Darnell Level ([id number]) on 06/09/2022 2:39:40 PM   Covid 19 pill  Molnupiravir   If needing prescription strength medication we will need to make an appointment with a provider.  These are over the counter medication options:  Mucinex dm green label for cough or robitussin DM  Multivitamin or below vitamins  Vitamin C 1000 mg daily.  Vitamin D3 4000 Iu (units) daily.  Zinc 100 mg daily.  Quercetin 250-500 mg 2 times per day   Elderberry  Oil of oregano  cepacol or chloroseptic spray Warm salt water gargles +hydrogen peroxide Sugar free cough drops  Warm tea with honey and lemon  Hydration  Try to eat though you dont feel like it   Tylenol or Advil  Nasal saline and Flonase 2 sprays nasal congestion  If sneezing/runny nose over the counter allergy pill claritin,allegra, zyrtec, xyzal Quarantine x 10-14 days 14 days preferred   Monitor pulse oximeter, buy from [place] if oxygen is less than 90 please go to the hospital.        Are you feeling really sick? Shortness of  breath, cough, chest pain?, dizziness? Confusion   If so let me know  If worsening, go to hospital or [facility] clinic Urgent care for further treatment.     Pneumococcal Conjugate Vaccine (Prevnar 20) Suspension for Injection What is this medication? PNEUMOCOCCAL VACCINE (NEU mo KOK al vak SEEN) is a vaccine. It prevents pneumococcus bacterial infections. These bacteria can cause serious infections like pneumonia, meningitis, and blood infections. This vaccine will not treat an infection and will not cause infection. This vaccine is recommended for adults 18 years and older. This medicine may be used for other purposes; ask your health care provider or pharmacist if you have questions. COMMON BRAND NAME(S): Prevnar 20 What should I tell my care team before I take this medication? They need to know if you have any of these conditions: bleeding disorder fever immune system problems an unusual or allergic reaction to pneumococcal vaccine, diphtheria toxoid, other vaccines, other medicines, foods, dyes, or preservatives pregnant or trying to get pregnant breast-feeding How should I use this medication? This vaccine is injected into a muscle. It is given by a health care provider. A copy of Vaccine Information Statements will be given before each vaccination. Be sure to read this information carefully each time. This sheet may change often. Talk to your health care provider about the use of this medicine in children. Special care may be needed. Overdosage: If you think you have taken  too much of this medicine contact a poison control center or emergency room at once. NOTE: This medicine is only for you. Do not share this medicine with others. What if I miss a dose? This does not apply. This medicine is not for regular use. What may interact with this medication? medicines for cancer chemotherapy medicines that suppress your immune function steroid medicines like prednisone or cortisone This  list may not describe all possible interactions. Give your health care provider a list of all the medicines, herbs, non-prescription drugs, or dietary supplements you use. Also tell them if you smoke, drink alcohol, or use illegal drugs. Some items may interact with your medicine. What should I watch for while using this medication? Mild fever and pain should go away in 3 days or less. Report any unusual symptoms to your health care provider. What side effects may I notice from receiving this medication? Side effects that you should report to your doctor or health care professional as soon as possible: allergic reactions (skin rash, itching or hives; swelling of the face, lips, or tongue) confusion fast, irregular heartbeat fever over 102 degrees F muscle weakness seizures trouble breathing unusual bruising or bleeding Side effects that usually do not require medical attention (report to your doctor or health care professional if they continue or are bothersome): fever of 102 degrees F or less headache joint pain muscle cramps, pain pain, tender at site where injected This list may not describe all possible side effects. Call your doctor for medical advice about side effects. You may report side effects to FDA at 1-800-FDA-1088. Where should I keep my medication? This vaccine is only given by a health care provider. It will not be stored at home. NOTE: This sheet is a summary. It may not cover all possible information. If you have questions about this medicine, talk to your doctor, pharmacist, or health care provider.  2023 Elsevier/Gold Standard (2020-07-13 00:00:00)

## 2022-06-30 ENCOUNTER — Telehealth: Payer: Self-pay

## 2022-06-30 NOTE — Telephone Encounter (Signed)
Unable to leave message for patient, line rings then disconnects.

## 2022-06-30 NOTE — Progress Notes (Signed)
Please call to schedule office appt with me or APP for wt loss, history of colon polyps

## 2022-06-30 NOTE — Telephone Encounter (Signed)
Per Dr. Fuller Plan, "Please call to schedule office appt with me or APP for wt loss, history of colon polyps."

## 2022-07-01 NOTE — Telephone Encounter (Signed)
Unable to leave message for patient, line rings then disconnects.

## 2022-07-02 NOTE — Telephone Encounter (Signed)
Unable to reach patient x 3. Left detailed message on wife's cell phone & sent a mychart message as well.

## 2022-07-09 ENCOUNTER — Encounter: Payer: Self-pay | Admitting: Dermatology

## 2022-07-15 DIAGNOSIS — N401 Enlarged prostate with lower urinary tract symptoms: Secondary | ICD-10-CM | POA: Diagnosis not present

## 2022-07-15 DIAGNOSIS — D400 Neoplasm of uncertain behavior of prostate: Secondary | ICD-10-CM | POA: Diagnosis not present

## 2022-07-15 DIAGNOSIS — R972 Elevated prostate specific antigen [PSA]: Secondary | ICD-10-CM | POA: Diagnosis not present

## 2022-08-06 MED ORDER — METOPROLOL SUCCINATE ER 25 MG PO TB24: 12.5000 mg | ORAL_TABLET | Freq: Every day | ORAL | 3 refills | Status: DC

## 2022-08-06 MED ORDER — OMEPRAZOLE 40 MG PO CPDR: 40.0000 mg | DELAYED_RELEASE_CAPSULE | Freq: Every day | ORAL | 3 refills | Status: DC

## 2022-08-06 MED ORDER — METFORMIN HCL ER 500 MG PO TB24: 1000.0000 mg | ORAL_TABLET | Freq: Two times a day (BID) | ORAL | 3 refills | Status: DC

## 2022-08-06 MED ORDER — LOSARTAN POTASSIUM 25 MG PO TABS: 25.0000 mg | ORAL_TABLET | Freq: Two times a day (BID) | ORAL | 3 refills | Status: DC

## 2022-08-06 MED ORDER — ATORVASTATIN CALCIUM 20 MG PO TABS: 20.0000 mg | ORAL_TABLET | Freq: Every day | ORAL | 3 refills | Status: DC

## 2022-08-06 MED ORDER — FREESTYLE LIBRE 2 SENSOR MISC: 1.0000 | 11 refills | Status: DC

## 2022-08-06 MED ORDER — ALBUTEROL SULFATE HFA 108 (90 BASE) MCG/ACT IN AERS: 1.0000 | INHALATION_SPRAY | Freq: Four times a day (QID) | RESPIRATORY_TRACT | 11 refills | Status: DC | PRN

## 2022-08-06 MED ORDER — TRESIBA FLEXTOUCH 200 UNIT/ML ~~LOC~~ SOPN: 78.0000 [IU] | PEN_INJECTOR | Freq: Every day | SUBCUTANEOUS | 3 refills | Status: DC

## 2022-10-02 ENCOUNTER — Ambulatory Visit: Payer: PPO | Admitting: Dermatology

## 2022-10-07 ENCOUNTER — Other Ambulatory Visit (INDEPENDENT_AMBULATORY_CARE_PROVIDER_SITE_OTHER): Payer: PPO

## 2022-10-07 ENCOUNTER — Ambulatory Visit: Payer: PPO | Admitting: Gastroenterology

## 2022-10-07 ENCOUNTER — Encounter: Payer: Self-pay | Admitting: Gastroenterology

## 2022-10-07 VITALS — BP 142/76 | HR 84 | Ht 72.0 in | Wt 177.0 lb

## 2022-10-07 DIAGNOSIS — I152 Hypertension secondary to endocrine disorders: Secondary | ICD-10-CM

## 2022-10-07 DIAGNOSIS — R634 Abnormal weight loss: Secondary | ICD-10-CM

## 2022-10-07 DIAGNOSIS — E559 Vitamin D deficiency, unspecified: Secondary | ICD-10-CM

## 2022-10-07 DIAGNOSIS — Z86010 Personal history of colonic polyps: Secondary | ICD-10-CM | POA: Diagnosis not present

## 2022-10-07 DIAGNOSIS — E538 Deficiency of other specified B group vitamins: Secondary | ICD-10-CM | POA: Diagnosis not present

## 2022-10-07 DIAGNOSIS — E1159 Type 2 diabetes mellitus with other circulatory complications: Secondary | ICD-10-CM | POA: Diagnosis not present

## 2022-10-07 LAB — LIPID PANEL
Cholesterol: 91 mg/dL (ref 0–200)
HDL: 51.1 mg/dL (ref >39.00)
LDL Cholesterol: 18 mg/dL (ref 0–99)
NonHDL: 39.75
Total CHOL/HDL Ratio: 2
Triglycerides: 109 mg/dL (ref 0.0–149.0)
VLDL: 21.8 mg/dL (ref 0.0–40.0)

## 2022-10-07 LAB — CBC WITH DIFFERENTIAL/PLATELET
Basophils Absolute: 0 10*3/uL (ref 0.0–0.1)
Basophils Relative: 0.7 % (ref 0.0–3.0)
Eosinophils Absolute: 0.2 10*3/uL (ref 0.0–0.7)
Eosinophils Relative: 3 % (ref 0.0–5.0)
HCT: 44.5 % (ref 39.0–52.0)
Hemoglobin: 15.1 g/dL (ref 13.0–17.0)
Lymphocytes Relative: 32.8 % (ref 12.0–46.0)
Lymphs Abs: 2.1 10*3/uL (ref 0.7–4.0)
MCHC: 33.8 g/dL (ref 30.0–36.0)
MCV: 95.7 fl (ref 78.0–100.0)
Monocytes Absolute: 0.5 10*3/uL (ref 0.1–1.0)
Monocytes Relative: 8 % (ref 3.0–12.0)
Neutro Abs: 3.6 10*3/uL (ref 1.4–7.7)
Neutrophils Relative %: 55.5 % (ref 43.0–77.0)
Platelets: 184 10*3/uL (ref 150.0–400.0)
RBC: 4.65 Mil/uL (ref 4.22–5.81)
RDW: 13.7 % (ref 11.5–15.5)
WBC: 6.5 10*3/uL (ref 4.0–10.5)

## 2022-10-07 LAB — COMPREHENSIVE METABOLIC PANEL
ALT: 15 U/L (ref 0–53)
AST: 15 U/L (ref 0–37)
Albumin: 4.4 g/dL (ref 3.5–5.2)
Alkaline Phosphatase: 67 U/L (ref 39–117)
BUN: 18 mg/dL (ref 6–23)
CO2: 29 mEq/L (ref 19–32)
Calcium: 9.9 mg/dL (ref 8.4–10.5)
Chloride: 101 mEq/L (ref 96–112)
Creatinine, Ser: 1.13 mg/dL (ref 0.40–1.50)
GFR: 64.08 mL/min (ref >60.00)
Glucose, Bld: 210 mg/dL — ABNORMAL HIGH (ref 70–99)
Potassium: 4.5 mEq/L (ref 3.5–5.1)
Sodium: 136 mEq/L (ref 135–145)
Total Bilirubin: 0.9 mg/dL (ref 0.2–1.2)
Total Protein: 7.4 g/dL (ref 6.0–8.3)

## 2022-10-07 LAB — HEMOGLOBIN A1C: Hgb A1c MFr Bld: 7.8 % — ABNORMAL HIGH (ref 4.6–6.5)

## 2022-10-07 LAB — VITAMIN B12: Vitamin B-12: 262 pg/mL (ref 211–911)

## 2022-10-07 LAB — TSH: TSH: 1.1 u[IU]/mL (ref 0.35–5.50)

## 2022-10-07 LAB — VITAMIN D 25 HYDROXY (VIT D DEFICIENCY, FRACTURES): VITD: 39.01 ng/mL (ref 30.00–100.00)

## 2022-10-07 MED ORDER — NA SULFATE-K SULFATE-MG SULF 17.5-3.13-1.6 GM/177ML PO SOLN: 1.0000 | Freq: Once | ORAL | 0 refills | Status: AC

## 2022-10-07 NOTE — Progress Notes (Signed)
Assessment     Weight loss - R/O inadequate calorie intake, occult malignancy, malabsorption  GERD with LA grade A esophagitis, history of esophageal stricture Personal history of adenomatous colon polyps   Recommendations    CBC, CMP, TSH, CT AP He is advised to increase daily caloric intake.  He is advised have lunch daily and if he cannot have lunch take 2 Ensure or 2 Boost for lunch Continue omeprazole 40 mg daily and follow antireflux measures Schedule colonoscopy and EGD. The risks (including bleeding, perforation, infection, missed lesions, medication reactions and possible hospitalization or surgery if complications occur), benefits, and alternatives to colonoscopy with possible biopsy and possible polypectomy were discussed with the patient and they consent to proceed.  The risks (including bleeding, perforation, infection, missed lesions, medication reactions and possible hospitalization or surgery if complications occur), benefits, and alternatives to endoscopy with possible biopsy and possible dilation were discussed with the patient and they consent to proceed.     HPI    This is a 76 year old male  with weight loss.  He relates a slow steady weight loss for over 1 year.  He was on Ozempic for a period of time and this was discontinued however his weight loss persisted.  He relates he has had a decrease in appetite and it change in his eating patterns since he now resides with his son after he and his wife have separated.  He generally does not eat lunch and eats healthier meals at breakfast and dinner.  Wt 224 lbs in 10/2021 and wt 177 lbs today.  He states he has a soft to loose a bowel movement every 2 to 3 days.  This bowel pattern has not changed. Denies abdominal pain, constipation, diarrhea, change in stool caliber, melena, hematochezia, nausea, vomiting, dysphagia, reflux symptoms, chest pain.   Labs / Imaging       Latest Ref Rng & Units 02/14/2022    8:22 AM  10/01/2021   12:33 PM 09/25/2020    2:50 PM  Hepatic Function  Total Protein 6.0 - 8.3 g/dL 6.3  6.7  7.2   Albumin 3.5 - 5.2 g/dL 4.2  4.1  4.2   AST 0 - 37 U/L _0 ALT 0 - 53 U/L _1 Alk Phosphatase 39 - 117 U/L 63  56  57   Total Bilirubin 0.2 - 1.2 mg/dL 0.9  1.0  0.7        Latest Ref Rng & Units 04/24/2022    4:47 PM 02/14/2022    8:22 AM 10/01/2021   12:33 PM  CBC  WBC 4.0 - 10.5 K/uL 9.7  6.9  7.9   Hemoglobin 13.0 - 17.0 g/dL 13.4  14.7  16.0   Hematocrit 39.0 - 52.0 % 38.5  42.4  47.3   Platelets 150 - 400 K/uL 183  175.0  206.0     Current Medications, Allergies, Past Medical History, Past Surgical History, Family History and Social History were reviewed in [employer] record.   Physical Exam: General: Well developed, well nourished, no acute distress Head: Normocephalic and atraumatic Eyes: Sclerae anicteric, EOMI Ears: Normal auditory acuity Mouth: Not examined Lungs: Clear throughout to auscultation Heart: Regular rate and rhythm; no murmurs, rubs or bruits Abdomen: Soft, non tender and non distended. No masses, hepatosplenomegaly or hernias noted. Normal Bowel sounds Rectal: Deferred to colonoscopy Musculoskeletal: Symmetrical with no gross deformities  Pulses:  Normal pulses noted Extremities: No clubbing, cyanosis, edema or deformities noted Neurological: Alert oriented x 4, grossly nonfocal Psychological:  Alert and cooperative. Normal mood and affect   Malcolm T. Fuller Plan, MD 10/07/2022, 11:10 AM

## 2022-10-07 NOTE — Patient Instructions (Signed)
Your provider has requested that you go to the basement level for lab work before leaving today. Press "B" on the elevator. The lab is located at the first door on the left as you exit the elevator.  Please start drinking 2 Ensures or Boosts at lunchtime.  You have been scheduled for a CT scan of the abdomen and pelvis at [facility], 1st floor Radiology. You are scheduled on 10/12/22 at 9:00am. You should arrive 15 minutes prior to your appointment time for registration.  We are giving you 2 bottles of contrast today that you will need to drink before arriving for the exam. The solution may taste better if refrigerated so put them in the refrigerator when you get home, but do NOT add ice or any other liquid to this solution as that would dilute it. Shake well before drinking.   Please follow the written instructions below on the day of your exam:   1) Do not eat anything after 5:00am (4 hours prior to your test)    If you have any questions regarding your exam or if you need to reschedule, you may call [facility] at [phone] between the hours of 8:00 am and 5:00 pm, Monday-Friday.    You have been scheduled for an endoscopy and colonoscopy. Please follow the written instructions given to you at your visit today. Please pick up your prep supplies at the pharmacy within the next 1-3 days. If you use inhalers (even only as needed), please bring them with you on the day of your procedure.  ORAL DIABETIC MEDICATION INSTRUCTIONS (Metformin, Glipizide, Glimepiride, Farxiga, [place], Rybelsus, [place], [place], Dustin)   The day before your procedure:  Take your diabetic pill as you do normally  The day of your procedure:  Do not take your diabetic pill   We will check your blood sugar levels during the admission process and again in Recovery before discharging you home  _____________________________________________________________________________  INSULIN  (LONG ACTING) MEDICATION INSTRUCTIONS (Lantus, Levemir, Basaglar, NPH, 70/30, Humulin, Novolin-N, Toujeo,Tresiba, [place], [place])  The day before your procedure:  Take  your regular daily dose   The day of your procedure:  Do not take your morning dose We will check your blood sugar levels during the admission process and again in Recovery before discharging you home    INSULIN (SHORT ACTING) MEDICATION INSTRUCTIONS (Regular, Humulog, Novolog,Novolin-R)  The day before your procedure:  Do not take your evening dose  The day of your procedure:  Do not take your morning dose We will check your blood sugar levels during the admission process and again in Recovery before discharging you home     ONCE A WEEK INJECTIONS Trulicity/ Ozempic/ Mounjaro/ Wegovy - DO NOT TAKE three days prior to the procedure  _____________________________________________________________________________  ONCE A DAY INJECTIONS Byetta,Victoza,Saxenda,Adlyxin- DO NOT TAKE 2 days prior to the procedure    The [facility] providers would like to encourage you to use [facility] to communicate with providers for non-urgent requests or questions.  Due to long hold times on the telephone, sending your provider a message by [facility] may be a faster and more efficient way to get a response.  Please allow 48 business hours for a response.  Please remember that this is for non-urgent requests.   Due to recent changes in healthcare laws, you may see the results of your imaging and laboratory studies on MyChart before your provider has had a chance to review them.  We understand that in some  cases there may be results that are confusing or concerning to you. Not all laboratory results come back in the same time frame and the provider may be waiting for multiple results in order to interpret others.  Please give [place] 48 hours in order for your provider to thoroughly review all the results before contacting the office for  clarification of your results.   Thank you for choosing me and [facility].  Pricilla Riffle. Dagoberto Ligas., MD., Marval Regal

## 2022-10-11 ENCOUNTER — Ambulatory Visit (HOSPITAL_COMMUNITY)
Admission: RE | Admit: 2022-10-11 | Discharge: 2022-10-11 | Disposition: A | Discharge status: Home / Self Care | Payer: PPO | Source: Ambulatory Visit | Attending: Gastroenterology | Admitting: Gastroenterology

## 2022-10-11 DIAGNOSIS — K760 Fatty (change of) liver, not elsewhere classified: Secondary | ICD-10-CM | POA: Diagnosis not present

## 2022-10-11 DIAGNOSIS — N200 Calculus of kidney: Secondary | ICD-10-CM | POA: Diagnosis not present

## 2022-10-11 DIAGNOSIS — R634 Abnormal weight loss: Secondary | ICD-10-CM

## 2022-10-11 MED ORDER — IOHEXOL 300 MG/ML  SOLN
100.0000 mL | Freq: Once | INTRAMUSCULAR | Status: AC | PRN
  Administered 2022-10-11: 100 mL via INTRAVENOUS

## 2022-10-12 ENCOUNTER — Ambulatory Visit (HOSPITAL_COMMUNITY): Payer: PPO

## 2022-10-27 ENCOUNTER — Encounter: Payer: Self-pay | Admitting: Gastroenterology

## 2022-11-04 ENCOUNTER — Encounter: Payer: Self-pay | Admitting: Gastroenterology

## 2022-11-04 ENCOUNTER — Ambulatory Visit (AMBULATORY_SURGERY_CENTER): Payer: PPO | Admitting: Gastroenterology

## 2022-11-04 VITALS — BP 131/65 | HR 71 | Temp 98.0°F | Resp 11 | Ht 72.0 in | Wt 177.0 lb

## 2022-11-04 DIAGNOSIS — K219 Gastro-esophageal reflux disease without esophagitis: Secondary | ICD-10-CM

## 2022-11-04 DIAGNOSIS — K319 Disease of stomach and duodenum, unspecified: Secondary | ICD-10-CM | POA: Diagnosis not present

## 2022-11-04 DIAGNOSIS — Z86010 Personal history of colonic polyps: Secondary | ICD-10-CM | POA: Diagnosis not present

## 2022-11-04 DIAGNOSIS — R634 Abnormal weight loss: Secondary | ICD-10-CM | POA: Diagnosis not present

## 2022-11-04 DIAGNOSIS — Z09 Encounter for follow-up examination after completed treatment for conditions other than malignant neoplasm: Secondary | ICD-10-CM | POA: Diagnosis not present

## 2022-11-04 DIAGNOSIS — K3189 Other diseases of stomach and duodenum: Secondary | ICD-10-CM | POA: Diagnosis not present

## 2022-11-04 MED ORDER — SODIUM CHLORIDE 0.9 % IV SOLN: 500.0000 mL | Freq: Once | INTRAVENOUS | Status: DC

## 2022-11-04 NOTE — Patient Instructions (Addendum)
Information on gastritis given to you today.  - Resume previous diet. - Continue present medications. - No repeat colonoscopy due to age and the absence of colonic polyps.  Await pathology results from the biopsies taken today.   YOU HAD AN ENDOSCOPIC PROCEDURE TODAY AT [facility]:   Refer to the procedure report that was given to you for any specific questions about what was found during the examination.  If the procedure report does not answer your questions, please call your gastroenterologist to clarify.  If you requested that your care partner not be given the details of your procedure findings, then the procedure report has been included in a sealed envelope for you to review at your convenience later.  YOU SHOULD EXPECT: Some feelings of bloating in the abdomen. Passage of more gas than usual.  Walking can help get rid of the air that was put into your GI tract during the procedure and reduce the bloating. If you had a lower endoscopy (such as a colonoscopy or flexible sigmoidoscopy) you may notice spotting of blood in your stool or on the toilet paper. If you underwent a bowel prep for your procedure, you may not have a normal bowel movement for a few days.  Please Note:  You might notice some irritation and congestion in your nose or some drainage.  This is from the oxygen used during your procedure.  There is no need for concern and it should clear up in a day or so.  SYMPTOMS TO REPORT IMMEDIATELY:  Following lower endoscopy (colonoscopy or flexible sigmoidoscopy):  Excessive amounts of blood in the stool  Significant tenderness or worsening of abdominal pains  Swelling of the abdomen that is new, acute  Fever of 100F or higher  Following upper endoscopy (EGD)  Vomiting of blood or coffee ground material  New chest pain or pain under the shoulder blades  Painful or persistently difficult swallowing  New shortness of breath  Fever of 100F or higher  Black,  tarry-looking stools  For urgent or emergent issues, a gastroenterologist can be reached at any hour by calling [phone]. Do not use MyChart messaging for urgent concerns.    DIET:  We do recommend a small meal at first, but then you may proceed to your regular diet.  Drink plenty of fluids but you should avoid alcoholic beverages for 24 hours.  ACTIVITY:  You should plan to take it easy for the rest of today and you should NOT DRIVE or use heavy machinery until tomorrow (because of the sedation medicines used during the test).    FOLLOW UP: Our staff will call the number listed on your records the next business day following your procedure.  We will call around 7:15- 8:00 am to check on you and address any questions or concerns that you may have regarding the information given to you following your procedure. If we do not reach you, we will leave a message.     If any biopsies were taken you will be contacted by phone or by letter within the next 1-3 weeks.  Please call us at [phone] if you have not heard about the biopsies in 3 weeks.    SIGNATURES/CONFIDENTIALITY: You and/or your care partner have signed paperwork which will be entered into your electronic medical record.  These signatures attest to the fact that that the information above on your After Visit Summary has been reviewed and is understood.  Full responsibility of the confidentiality of  this discharge information lies with you and/or your care-partner.

## 2022-11-04 NOTE — Progress Notes (Signed)
Vss nad trans to pacu °

## 2022-11-04 NOTE — Op Note (Signed)
[place] Patient Name: [name] Procedure Date: 11/04/2022 2:40 PM MRN: [id number] Endoscopist: Ladene Artist , MD, [id number] Age: 77 Referring MD:  Date of Birth: May 03, 1946 Gender: Male Account #: [id number] Procedure:                Upper GI endoscopy Indications:              Gastroesophageal reflux disease, Weight loss Medicines:                Monitored Anesthesia Care Procedure:                Pre-Anesthesia Assessment:                           - Prior to the procedure, a History and Physical                            was performed, and patient medications and                            allergies were reviewed. The patient's tolerance of                            previous anesthesia was also reviewed. The risks                            and benefits of the procedure and the sedation                            options and risks were discussed with the patient.                            All questions were answered, and informed consent                            was obtained. Prior Anticoagulants: The patient has                            taken no anticoagulant or antiplatelet agents. ASA                            Grade Assessment: III - A patient with severe                            systemic disease. After reviewing the risks and                            benefits, the patient was deemed in satisfactory                            condition to undergo the procedure.                           After obtaining informed consent, the endoscope was  passed under direct vision. Throughout the                            procedure, the patient's blood pressure, pulse, and                            oxygen saturations were monitored continuously. The                            GIF HQ190 #[id number] was introduced through the                            mouth, and advanced to the second part of duodenum.                            The upper  GI endoscopy was accomplished without                            difficulty. The patient tolerated the procedure                            well. Scope In: Scope Out: Findings:                 The examined esophagus was normal.                           Patchy mildly erythematous mucosa without bleeding                            was found in the entire examined stomach. Biopsies                            were taken with a cold forceps for histology.                           The exam of the stomach was otherwise normal.                           The duodenal bulb and second portion of the                            duodenum were normal. Complications:            No immediate complications. Estimated Blood Loss:     Estimated blood loss was minimal. Impression:               - Normal esophagus.                           - Erythematous mucosa in the stomach. Biopsied.                           - Normal duodenal bulb and second portion of the  duodenum. Recommendation:           - Patient has a contact number available for                            emergencies. The signs and symptoms of potential                            delayed complications were discussed with the                            patient. Return to normal activities tomorrow.                            Written discharge instructions were provided to the                            patient.                           - Resume previous diet.                           - Follow antireflux measures.                           - Continue present medications.                           - Await pathology results. Ladene Artist, MD 11/04/2022 3:17:12 PM This report has been signed electronically.

## 2022-11-04 NOTE — Progress Notes (Signed)
VS completed by DT.  Pt's states no medical or surgical changes since previsit or office visit.  

## 2022-11-04 NOTE — Progress Notes (Signed)
Called to room to assist during endoscopic procedure.  Patient ID and intended procedure confirmed with present staff. Received instructions for my participation in the procedure from the performing physician.  

## 2022-11-04 NOTE — Progress Notes (Signed)
10/07/2022 H&P, no changes

## 2022-11-04 NOTE — Op Note (Signed)
[place] Patient Name: [name] Procedure Date: 11/04/2022 2:40 PM MRN: [id number] Endoscopist: Ladene Artist , MD, [id number] Age: 76 Referring MD:  Date of Birth: Oct 10, 1946 Gender: Male Account #: [id number] Procedure:                Colonoscopy Indications:              Surveillance: Personal history of adenomatous                            polyps on last colonoscopy 3 years ago Medicines:                Monitored Anesthesia Care Procedure:                Pre-Anesthesia Assessment:                           - Prior to the procedure, a History and Physical                            was performed, and patient medications and                            allergies were reviewed. The patient's tolerance of                            previous anesthesia was also reviewed. The risks                            and benefits of the procedure and the sedation                            options and risks were discussed with the patient.                            All questions were answered, and informed consent                            was obtained. Prior Anticoagulants: The patient has                            taken no anticoagulant or antiplatelet agents. ASA                            Grade Assessment: III - A patient with severe                            systemic disease. After reviewing the risks and                            benefits, the patient was deemed in satisfactory                            condition to undergo the procedure.  After obtaining informed consent, the colonoscope                            was passed under direct vision. Throughout the                            procedure, the patient's blood pressure, pulse, and                            oxygen saturations were monitored continuously. The                            [id number]-HQ190L Colonoscope was introduced through the                            anus and advanced to  the the cecum, identified by                            appendiceal orifice and ileocecal valve. The                            ileocecal valve, appendiceal orifice, and rectum                            were photographed. The quality of the bowel                            preparation was good. The colonoscopy was performed                            without difficulty. The patient tolerated the                            procedure well. Scope In: 2:42:10 PM Scope Out: 3:01:23 PM Scope Withdrawal Time: 0 hours 14 minutes 13 seconds  Total Procedure Duration: 0 hours 19 minutes 13 seconds  Findings:                 The perianal and digital rectal examinations were                            normal.                           The entire examined colon appeared normal on direct                            and retroflexion views. Complications:            No immediate complications. Estimated blood loss:                            None. Estimated Blood Loss:     Estimated blood loss: none. Impression:               - The entire examined colon is normal on direct and  retroflexion views.                           - No specimens collected. Recommendation:           - Patient has a contact number available for                            emergencies. The signs and symptoms of potential                            delayed complications were discussed with the                            patient. Return to normal activities tomorrow.                            Written discharge instructions were provided to the                            patient.                           - Resume previous diet.                           - Continue present medications.                           - No repeat colonoscopy due to age and the absence                            of colonic polyps. Ladene Artist, MD 11/04/2022 3:13:38 PM This report has been signed electronically.

## 2022-11-05 ENCOUNTER — Telehealth: Payer: Self-pay | Admitting: *Deleted

## 2022-11-05 NOTE — Telephone Encounter (Signed)
  Follow up Call-     11/04/2022    1:48 PM  Call back number  Post procedure Call Back phone  # [phone]  Permission to leave phone message Yes     Patient questions:  Do you have a fever, pain , or abdominal swelling? No. Pain Score  0 *  Have you tolerated food without any problems? Yes.    Have you been able to return to your normal activities? Yes.    Do you have any questions about your discharge instructions: Diet   No. Medications  No. Follow up visit  No.  Do you have questions or concerns about your Care? No.  Actions: * If pain score is 4 or above: No action needed, pain <4.

## 2022-11-20 ENCOUNTER — Encounter: Payer: Self-pay | Admitting: Gastroenterology

## 2022-12-22 ENCOUNTER — Telehealth: Payer: Self-pay | Admitting: Internal Medicine

## 2022-12-22 ENCOUNTER — Other Ambulatory Visit: Payer: Self-pay

## 2022-12-22 DIAGNOSIS — I2510 Atherosclerotic heart disease of native coronary artery without angina pectoris: Secondary | ICD-10-CM

## 2022-12-22 DIAGNOSIS — Z794 Long term (current) use of insulin: Secondary | ICD-10-CM

## 2022-12-22 DIAGNOSIS — I10 Essential (primary) hypertension: Secondary | ICD-10-CM

## 2022-12-22 DIAGNOSIS — E785 Hyperlipidemia, unspecified: Secondary | ICD-10-CM

## 2022-12-22 MED ORDER — ATORVASTATIN CALCIUM 20 MG PO TABS: 20.0000 mg | ORAL_TABLET | Freq: Every day | ORAL | 0 refills | Status: DC

## 2022-12-22 MED ORDER — OMEPRAZOLE 40 MG PO CPDR: 40.0000 mg | DELAYED_RELEASE_CAPSULE | Freq: Every day | ORAL | 0 refills | Status: DC

## 2022-12-22 MED ORDER — METOPROLOL SUCCINATE ER 25 MG PO TB24: 12.5000 mg | ORAL_TABLET | Freq: Every day | ORAL | 0 refills | Status: DC

## 2022-12-22 NOTE — Telephone Encounter (Signed)
Refills have been sent enough to cover until 12/31/22 appt with [name]

## 2022-12-22 NOTE — Telephone Encounter (Signed)
Pt came into the office stating that all his medication that was prescribed to him was taken out of his truck and he need a refill on all of them [facility] [place]

## 2022-12-31 ENCOUNTER — Encounter: Payer: PPO | Admitting: Family Medicine

## 2023-01-01 ENCOUNTER — Other Ambulatory Visit (HOSPITAL_COMMUNITY): Payer: Self-pay

## 2023-01-06 ENCOUNTER — Other Ambulatory Visit (HOSPITAL_COMMUNITY): Payer: Self-pay

## 2023-01-06 ENCOUNTER — Telehealth: Payer: Self-pay

## 2023-01-06 NOTE — Telephone Encounter (Signed)
Pharmacy Patient Advocate Encounter  Received notification from [employer] that the request for prior authorization for [facility] 2 has been denied due to .    Please be advised we currently do not have a Pharmacist to review denials, therefore you will need to process appeals accordingly as needed. Thanks for your support at this time.   You may fax [phone] to appeal.

## 2023-02-10 ENCOUNTER — Other Ambulatory Visit (HOSPITAL_COMMUNITY): Payer: Self-pay

## 2023-05-26 ENCOUNTER — Encounter: Payer: Self-pay | Admitting: Family Medicine

## 2023-05-26 ENCOUNTER — Ambulatory Visit: Payer: Medicare Other | Admitting: Family Medicine

## 2023-05-26 VITALS — BP 150/72 | HR 75 | Temp 97.8°F | Resp 16 | Ht 72.0 in | Wt 180.0 lb

## 2023-05-26 DIAGNOSIS — E559 Vitamin D deficiency, unspecified: Secondary | ICD-10-CM

## 2023-05-26 DIAGNOSIS — E1165 Type 2 diabetes mellitus with hyperglycemia: Secondary | ICD-10-CM

## 2023-05-26 DIAGNOSIS — E1159 Type 2 diabetes mellitus with other circulatory complications: Secondary | ICD-10-CM | POA: Diagnosis not present

## 2023-05-26 DIAGNOSIS — E118 Type 2 diabetes mellitus with unspecified complications: Secondary | ICD-10-CM

## 2023-05-26 DIAGNOSIS — E538 Deficiency of other specified B group vitamins: Secondary | ICD-10-CM | POA: Diagnosis not present

## 2023-05-26 DIAGNOSIS — E119 Type 2 diabetes mellitus without complications: Secondary | ICD-10-CM

## 2023-05-26 DIAGNOSIS — E785 Hyperlipidemia, unspecified: Secondary | ICD-10-CM | POA: Diagnosis not present

## 2023-05-26 DIAGNOSIS — I152 Hypertension secondary to endocrine disorders: Secondary | ICD-10-CM | POA: Diagnosis not present

## 2023-05-26 DIAGNOSIS — E1169 Type 2 diabetes mellitus with other specified complication: Secondary | ICD-10-CM | POA: Diagnosis not present

## 2023-05-26 LAB — COMPREHENSIVE METABOLIC PANEL
ALT: 17 U/L (ref 0–53)
AST: 15 U/L (ref 0–37)
Albumin: 4.3 g/dL (ref 3.5–5.2)
Alkaline Phosphatase: 96 U/L (ref 39–117)
BUN: 17 mg/dL (ref 6–23)
CO2: 28 mEq/L (ref 19–32)
Calcium: 10.5 mg/dL (ref 8.4–10.5)
Chloride: 96 mEq/L (ref 96–112)
Creatinine, Ser: 0.96 mg/dL (ref 0.40–1.50)
GFR: 77.58 mL/min (ref >60.00)
Glucose, Bld: 370 mg/dL — ABNORMAL HIGH (ref 70–99)
Potassium: 4.5 mEq/L (ref 3.5–5.1)
Sodium: 133 mEq/L — ABNORMAL LOW (ref 135–145)
Total Bilirubin: 0.8 mg/dL (ref 0.2–1.2)
Total Protein: 7.5 g/dL (ref 6.0–8.3)

## 2023-05-26 LAB — CBC WITH DIFFERENTIAL/PLATELET
Basophils Absolute: 0 10*3/uL (ref 0.0–0.1)
Basophils Relative: 0.7 % (ref 0.0–3.0)
Eosinophils Absolute: 0.3 10*3/uL (ref 0.0–0.7)
Eosinophils Relative: 5.2 % — ABNORMAL HIGH (ref 0.0–5.0)
HCT: 45.9 % (ref 39.0–52.0)
Hemoglobin: 15.6 g/dL (ref 13.0–17.0)
Lymphocytes Relative: 29 % (ref 12.0–46.0)
Lymphs Abs: 1.8 10*3/uL (ref 0.7–4.0)
MCHC: 34 g/dL (ref 30.0–36.0)
MCV: 94.7 fl (ref 78.0–100.0)
Monocytes Absolute: 0.5 10*3/uL (ref 0.1–1.0)
Monocytes Relative: 8.2 % (ref 3.0–12.0)
Neutro Abs: 3.6 10*3/uL (ref 1.4–7.7)
Neutrophils Relative %: 56.9 % (ref 43.0–77.0)
Platelets: 179 10*3/uL (ref 150.0–400.0)
RBC: 4.85 Mil/uL (ref 4.22–5.81)
RDW: 13.6 % (ref 11.5–15.5)
WBC: 6.3 10*3/uL (ref 4.0–10.5)

## 2023-05-26 LAB — LIPID PANEL
Cholesterol: 144 mg/dL (ref 0–200)
HDL: 51 mg/dL (ref >39.00)
LDL Cholesterol: 72 mg/dL (ref 0–99)
NonHDL: 92.98
Total CHOL/HDL Ratio: 3
Triglycerides: 103 mg/dL (ref 0.0–149.0)
VLDL: 20.6 mg/dL (ref 0.0–40.0)

## 2023-05-26 LAB — MICROALBUMIN / CREATININE URINE RATIO
Creatinine,U: 22.4 mg/dL
Microalb Creat Ratio: 40.9 mg/g — ABNORMAL HIGH (ref 0.0–30.0)
Microalb, Ur: 9.2 mg/dL — ABNORMAL HIGH (ref 0.0–1.9)

## 2023-05-26 LAB — HEMOGLOBIN A1C: Hgb A1c MFr Bld: 9.8 % — ABNORMAL HIGH (ref 4.6–6.5)

## 2023-05-26 LAB — VITAMIN D 25 HYDROXY (VIT D DEFICIENCY, FRACTURES): VITD: 45.76 ng/mL (ref 30.00–100.00)

## 2023-05-26 LAB — TSH: TSH: 1.51 u[IU]/mL (ref 0.35–5.50)

## 2023-05-26 LAB — VITAMIN B12: Vitamin B-12: 256 pg/mL (ref 211–911)

## 2023-05-26 MED ORDER — LOSARTAN POTASSIUM 50 MG PO TABS: 50.0000 mg | ORAL_TABLET | Freq: Every evening | ORAL | 0 refills | Status: DC

## 2023-05-26 NOTE — Patient Instructions (Addendum)
It was a pleasure meeting you today. Thank you for allowing me to take part in your health care.  Our goals for today as we discussed include:   Take a dose of Losartan when you get home  Tomorrow Losartan 50 mg at night.  Continue current medications. Will plan for adjustments at next visit and pending results of blood work.  We will get some labs today.  If they are abnormal or we need to do something about them, I will call you.  If they are normal, I will send you a message on MyChart (if it is active) or a letter in the mail.  If you don't hear from [place] in 2 weeks, please call the office at the number below.   Recommend Shingles vaccine.  This is a 2 dose series and can be given at your local pharmacy.  Please talk to your pharmacist about this.   Schedule Medicare Annual Wellness Visit   Referral sent for diabetic eye exam   If you have any questions or concerns, please do not hesitate to call the office at [phone].  I look forward to our next visit and until then take care and stay safe.  Regards,   Dana Allan, MD   [facility]

## 2023-05-26 NOTE — Progress Notes (Signed)
[id number]  [id number] .  SUBJECTIVE:   Chief Complaint  Patient presents with   Establish Care   HPI Patient presents to clinic to for care.  Type 2 diabetes Asymptomatic.  Denies polyuria polydipsia polyphagia. Self discontinued metformin and Ozempic.  Currently takes Tresiba 30-40 units every morning, previously prescribed 78 units daily.  Does not check CBGs at home.  Last A1c 7.8, 10/07/2022.  Patient reports had been living in [place] and is now returning to [place], would like to reestablish care.  Not currently on statin.  Recently restarted ARB.  Hypertension Asymptomatic.  Does not check blood pressures at home.  Self discontinued metoprolol 25 mg half a tablet daily and ARB.  Recently restarted losartan 25 mg 2 weeks ago.  Reports having mild headache that may be related to elevated blood pressure.  Headache has now resolved.  Denies any chest pain, shortness of breath or lower extremity edema.  Hyperlipidemia Previously prescribed atorvastatin 20 mg daily.  Patient self discontinued.  BPH Takes dutasteride 0.5 mg daily and Flomax 0.4 mg daily.  Follows with Dr. Sheppard Penton, urology.   PERTINENT PMH / PSH: DM type II Hypertension Hyperlipidemia BPH   OBJECTIVE:  BP (!) 150/72   Pulse 75   Temp 97.8 F (36.6 C)   Resp 16   Ht 6' (1.829 m)   Wt 180 lb (81.6 kg)   SpO2 99%   BMI 24.41 kg/m    Physical Exam Vitals reviewed.  HENT:     Head: Normocephalic.     Right Ear: Tympanic membrane, ear canal and external ear normal.     Left Ear: Tympanic membrane, ear canal and external ear normal.     Nose: Nose normal.     Mouth/Throat:     Mouth: Mucous membranes are moist.  Eyes:     Conjunctiva/sclera: Conjunctivae normal.     Pupils: Pupils are equal, round, and reactive to light.  Neck:     Thyroid: No thyromegaly or thyroid tenderness.     Vascular: No carotid bruit.  Cardiovascular:     Rate and Rhythm: Normal rate and regular rhythm.     Pulses: Normal  pulses.     Heart sounds: Normal heart sounds.  Pulmonary:     Effort: Pulmonary effort is normal.     Breath sounds: Normal breath sounds.  Abdominal:     General: Abdomen is flat. Bowel sounds are normal.     Palpations: Abdomen is soft.  Musculoskeletal:        General: Normal range of motion.     Cervical back: Normal range of motion and neck supple.     Right lower leg: No edema.     Left lower leg: No edema.  Lymphadenopathy:     Cervical: No cervical adenopathy.  Neurological:     Mental Status: He is alert.  Psychiatric:        Mood and Affect: Mood normal.        Behavior: Behavior normal.        Thought Content: Thought content normal.        Judgment: Judgment normal.        05/26/2023   10:22 AM 06/27/2022   10:13 AM 04/29/2022    1:54 PM 04/04/2022    9:11 AM 03/17/2022    3:58 PM  Depression screen PHQ 2/9  Decreased Interest 2 0 0 0 0  Down, Depressed, Hopeless 0 0 0 0 0  PHQ - 2 Score 2 0  0 0 0  Altered sleeping 2      Tired, decreased energy 1      Change in appetite 0      Feeling bad or failure about yourself  0      Trouble concentrating 0      Moving slowly or fidgety/restless 0      Suicidal thoughts 0      PHQ-9 Score 5      Difficult doing work/chores Somewhat difficult          05/26/2023   10:22 AM 05/24/2020    2:49 PM 01/20/2020    2:09 PM  GAD 7 : Generalized Anxiety Score  Nervous, Anxious, on Edge 0 0 0  Control/stop worrying 0 0 0  Worry too much - different things 0 0 0  Trouble relaxing 0 0 0  Restless 0 0 0  Easily annoyed or irritable 0 0 0  Afraid - awful might happen 0 0 0  Total GAD 7 Score 0 0 0  Anxiety Difficulty Not difficult at all Not difficult at all Not difficult at all    ASSESSMENT/PLAN:  Type 2 diabetes mellitus with complications [facility]) Assessment & Plan: Chronic.  Asymptomatic. Self discontinued metformin and Ozempic Currently takes [place] 30-40 units daily.  Previously prescribed 78 units daily Last A1c  09/2022, and not at goal less than 7. Repeat A1c Urine ACR today Discussed restarting metformin and Ozempic.  Patient hesitant.  Also discussed SGLT2's pending blood work.  Patient hesitant to start other antidiabetic medication. Will continue to discuss Referral sent for diabetic eye exam Foot exam at next visit Recommend statin   Orders: -     Hemoglobin A1c -     CBC with Differential/Platelet -     Microalbumin / creatinine urine ratio  B12 deficiency Assessment & Plan: Check vitamin B12 level  Orders: -     Vitamin B12  Hyperlipidemia associated with type 2 diabetes mellitus (HCC) Assessment & Plan: Chronic.  Previously Prescribed statin therapy but self discontinued. Check fasting lipids Recommend statin therapy given risk factors diabetes, hypertension, male gender Patient declined initiation of medication at this time.  Will continue to discuss  Orders: -     Lipid panel  Hypertension associated with diabetes [facility]) Assessment & Plan: Chronic.  Not at goal.  Elevated again at repeat. Patient self discontinued metoprolol and losartan.  Recently restarted 25 mg daily, prescribed 25 mg twice daily. Check c-Met today Increase losartan to 50 mg nightly Continue to monitor blood pressure at home, goal less than 4/90 Strict return precautions provided  Orders: -     Comprehensive metabolic panel -     TSH -     Losartan Potassium; Take 1 tablet (50 mg total) by mouth at bedtime. Take another pill in afternoon if BP>130/>80. D/c 150 mg qd  Dispense: 30 tablet; Refill: 0  Vitamin D deficiency Assessment & Plan: Chec vitamin D level   Orders: -     VITAMIN D 25 Hydroxy (Vit-D Deficiency, Fractures)  Diabetic eye exam [facility]) Assessment & Plan: Referral sent ophthalmology  Orders: -     Ambulatory referral to Ophthalmology  Declined shingles vaccine Foot exam at next visit Colonoscopy up-to-date, 12/23.  Followed by Dr. Russella Dar.  No indication for further colon  cancer screening per GI.  PDMP reviewed  Return in about 2 weeks (around 06/09/2023) for PCP.  Dana Allan, MD

## 2023-06-05 ENCOUNTER — Encounter: Payer: Self-pay | Admitting: Family Medicine

## 2023-06-09 ENCOUNTER — Encounter: Payer: Self-pay | Admitting: Family Medicine

## 2023-06-10 ENCOUNTER — Ambulatory Visit: Payer: Medicare Other | Admitting: Family Medicine

## 2023-06-17 ENCOUNTER — Other Ambulatory Visit: Payer: Self-pay | Admitting: Family Medicine

## 2023-06-17 DIAGNOSIS — Z794 Long term (current) use of insulin: Secondary | ICD-10-CM

## 2023-06-18 NOTE — Telephone Encounter (Signed)
Chart supports rx. Last OV: 05/26/2023 Next OV: 06/30/2023

## 2023-06-21 ENCOUNTER — Encounter: Payer: Self-pay | Admitting: Family Medicine

## 2023-06-21 DIAGNOSIS — E119 Type 2 diabetes mellitus without complications: Secondary | ICD-10-CM | POA: Insufficient documentation

## 2023-06-21 DIAGNOSIS — E118 Type 2 diabetes mellitus with unspecified complications: Secondary | ICD-10-CM | POA: Insufficient documentation

## 2023-06-21 MED ORDER — LOSARTAN POTASSIUM 50 MG PO TABS: 50.0000 mg | ORAL_TABLET | Freq: Every evening | ORAL | 0 refills | Status: DC

## 2023-06-21 NOTE — Assessment & Plan Note (Addendum)
Chronic.  Asymptomatic. Self discontinued metformin and Ozempic Currently takes [place] 30-40 units daily.  Previously prescribed 78 units daily Last A1c 09/2022, and not at goal less than 7. Repeat A1c Urine ACR today Discussed restarting metformin and Ozempic.  Patient hesitant.  Also discussed SGLT2's pending blood work.  Patient hesitant to start other antidiabetic medication. Will continue to discuss Referral sent for diabetic eye exam Foot exam at next visit Recommend statin

## 2023-06-21 NOTE — Assessment & Plan Note (Signed)
Chronic.  Previously Prescribed statin therapy but self discontinued. Check fasting lipids Recommend statin therapy given risk factors diabetes, hypertension, male gender Patient declined initiation of medication at this time.  Will continue to discuss

## 2023-06-21 NOTE — Assessment & Plan Note (Signed)
Chronic.  Not at goal.  Elevated again at repeat. Patient self discontinued metoprolol and losartan.  Recently restarted 25 mg daily, prescribed 25 mg twice daily. Check c-Met today Increase losartan to 50 mg nightly Continue to monitor blood pressure at home, goal less than 4/90 Strict return precautions provided

## 2023-06-21 NOTE — Assessment & Plan Note (Signed)
Chec vitamin D level

## 2023-06-21 NOTE — Assessment & Plan Note (Signed)
Check vitamin B12 level

## 2023-06-21 NOTE — Assessment & Plan Note (Signed)
Referral sent ophthalmology

## 2023-06-25 LAB — HM DIABETES EYE EXAM

## 2023-06-29 NOTE — Progress Notes (Addendum)
Marland Kitchen  SUBJECTIVE:   Chief Complaint  Patient presents with   Medical Management of Chronic Issues    DM   HPI Patient presents to clinic to for care.  Type 2 diabetes Recent A1c 9.8.  Continues to take Tresiba 40 units am and 20 units pm.  Has had some low blood glucose 50-55 in am, but asymptomatic.   Not on statin. Now on ARB Recently had eye exam completed.  Plan for upcoming bilateral cataract surgery.  Hypertension Asymptomatic.  Does not check blood pressures at home.  Tolerating increase in Losartan 50 mg daily.    Hyperlipidemia Recent LDL not at goal.  Agreeable to restarting statin.   PERTINENT PMH / PSH: DM type II Hypertension Hyperlipidemia BPH   OBJECTIVE:  BP 126/70   Pulse 69   Temp 97.9 F (36.6 C)   Resp 16   Ht 6' (1.829 m)   Wt 183 lb 4 oz (83.1 kg)   SpO2 100%   BMI 24.85 kg/m    Physical Exam Vitals reviewed.  Constitutional:      General: He is not in acute distress.    Appearance: Normal appearance. He is normal weight. He is not ill-appearing, toxic-appearing or diaphoretic.  Eyes:     General:        Right eye: No discharge.        Left eye: No discharge.  Cardiovascular:     Rate and Rhythm: Normal rate and regular rhythm.     Heart sounds: Normal heart sounds.  Pulmonary:     Effort: Pulmonary effort is normal.     Breath sounds: Normal breath sounds.  Skin:    General: Skin is warm and dry.  Neurological:     Mental Status: He is alert and oriented to person, place, and time. Mental status is at baseline.  Psychiatric:        Mood and Affect: Mood normal.        Behavior: Behavior normal.        Thought Content: Thought content normal.        Judgment: Judgment normal.        06/30/2023    3:49 PM 05/26/2023   10:22 AM 06/27/2022   10:13 AM 04/29/2022    1:54 PM 04/04/2022    9:11 AM  Depression screen PHQ 2/9  Decreased Interest 0 2 0 0 0  Down, Depressed, Hopeless 0 0 0 0 0  PHQ - 2 Score 0 2 0 0 0  Altered sleeping  2 2     Tired, decreased energy 2 1     Change in appetite 0 0     Feeling bad or failure about yourself  0 0     Trouble concentrating 0 0     Moving slowly or fidgety/restless 0 0     Suicidal thoughts 0 0     PHQ-9 Score 4 5     Difficult doing work/chores Not difficult at all Somewhat difficult         06/30/2023    3:50 PM 05/26/2023   10:22 AM 05/24/2020    2:49 PM 01/20/2020    2:09 PM  GAD 7 : Generalized Anxiety Score  Nervous, Anxious, on Edge 0 0 0 0  Control/stop worrying 0 0 0 0  Worry too much - different things 0 0 0 0  Trouble relaxing 0 0 0 0  Restless 0 0 0 0  Easily annoyed or irritable 0 0 0  0  Afraid - awful might happen 0 0 0 0  Total GAD 7 Score 0 0 0 0  Anxiety Difficulty Not difficult at all Not difficult at all Not difficult at all Not difficult at all    ASSESSMENT/PLAN:  Type 2 diabetes mellitus with complications [facility]) Assessment & Plan: Chronic.  Having some hypoglycemic episodes without symptoms Previously Self discontinued metformin and Ozempic Continue Tresiba 40 units am  Decrease tresiba from 20 units to 18 units pm  Repeat A1c Start Rybelsus 3 mg daily.  Samples provided x 30 days Eye exam complete Foot exam complete Start statin Continue ARB.  BP well controlled    Orders: -     Tresiba FlexTouch; Inject 58 Units into the skin daily. Take 40 units in am and 18 units at night  Dispense: 72 mL; Refill: 3 -     Rybelsus; Take 1 tablet (3 mg total) by mouth daily.  Dispense: 30 tablet; Refill: 3 -     AMB Referral to Pharmacy Medication Management  Hyperlipidemia associated with type 2 diabetes mellitus (HCC) Assessment & Plan: Chronic.  Elevated LDL Start Crestor 10 mg daily   Orders: -     Rosuvastatin Calcium; Take 1 tablet (10 mg total) by mouth daily.  Dispense: 90 tablet; Refill: 3 -     AMB Referral to Pharmacy Medication Management  B12 deficiency Assessment & Plan: Low Vitamin B 12 Start Vitamin B 12 1000 mcg daily  s/l  Orders: -     Vitamin B-12; Place 1 tablet (1,000 mcg total) under the tongue daily.  Dispense: 90 tablet; Refill: 3  Hypertension associated with diabetes (HCC) Assessment & Plan: Chronic.  Well controlled with increased dose of ARB Continue losartan to 50 mg nightly Continue to monitor blood pressure at home, goal less than 140/90 Strict return precautions provided  Orders: -     AMB Referral to Pharmacy Medication Management  Referral sent for DM management and medication financial assistance  Declined shingles vaccine Colonoscopy up-to-date, 12/23.  Followed by Dr. Russella Dar.  No indication for further colon cancer screening per GI.  PDMP reviewed  Return in about 3 months (around 09/30/2023) for PCP.  Dana Allan, MD

## 2023-06-30 ENCOUNTER — Ambulatory Visit (INDEPENDENT_AMBULATORY_CARE_PROVIDER_SITE_OTHER): Payer: Medicare Other | Admitting: Family Medicine

## 2023-06-30 ENCOUNTER — Encounter: Payer: Self-pay | Admitting: Family Medicine

## 2023-06-30 ENCOUNTER — Ambulatory Visit: Payer: Medicare Other | Admitting: Family Medicine

## 2023-06-30 VITALS — BP 126/70 | HR 69 | Temp 97.9°F | Resp 16 | Ht 72.0 in | Wt 183.2 lb

## 2023-06-30 DIAGNOSIS — E538 Deficiency of other specified B group vitamins: Secondary | ICD-10-CM

## 2023-06-30 DIAGNOSIS — E1159 Type 2 diabetes mellitus with other circulatory complications: Secondary | ICD-10-CM

## 2023-06-30 DIAGNOSIS — E118 Type 2 diabetes mellitus with unspecified complications: Secondary | ICD-10-CM

## 2023-06-30 DIAGNOSIS — E1169 Type 2 diabetes mellitus with other specified complication: Secondary | ICD-10-CM

## 2023-06-30 DIAGNOSIS — E1165 Type 2 diabetes mellitus with hyperglycemia: Secondary | ICD-10-CM

## 2023-06-30 DIAGNOSIS — E785 Hyperlipidemia, unspecified: Secondary | ICD-10-CM

## 2023-06-30 DIAGNOSIS — Z794 Long term (current) use of insulin: Secondary | ICD-10-CM

## 2023-06-30 DIAGNOSIS — I152 Hypertension secondary to endocrine disorders: Secondary | ICD-10-CM

## 2023-06-30 MED ORDER — RYBELSUS 3 MG PO TABS: 3.0000 mg | ORAL_TABLET | Freq: Every day | ORAL | Status: DC

## 2023-06-30 MED ORDER — TRESIBA FLEXTOUCH 200 UNIT/ML ~~LOC~~ SOPN: 58.0000 [IU] | PEN_INJECTOR | Freq: Every day | SUBCUTANEOUS | 3 refills | Status: DC

## 2023-06-30 MED ORDER — ROSUVASTATIN CALCIUM 10 MG PO TABS: 10.0000 mg | ORAL_TABLET | Freq: Every day | ORAL | 3 refills | Status: DC

## 2023-06-30 MED ORDER — VITAMIN B-12 1000 MCG SL SUBL: 1000.0000 ug | SUBLINGUAL_TABLET | Freq: Every day | SUBLINGUAL | 3 refills | Status: AC

## 2023-06-30 NOTE — Patient Instructions (Addendum)
It was a pleasure meeting you today. Thank you for allowing me to take part in your health care.  Our goals for today as we discussed include:  Start Rybelsus 3 mg daily for 4 weeks. If tolerating medication will increase to 7 mg daily.  You will need to call the clinic to let me know if you are tolerating the 3 mg dose  Decrease Tresiba to 18 units at night Continue Tresiba 40 units in morning  Start Crestor 10 mg daily  Follow up in 3 months   If you have any questions or concerns, please do not hesitate to call the office at [phone].  I look forward to our next visit and until then take care and stay safe.  Regards,   Dana Allan, MD   [facility]

## 2023-07-02 ENCOUNTER — Ambulatory Visit: Payer: PPO | Admitting: Dermatology

## 2023-07-07 ENCOUNTER — Encounter: Payer: Self-pay | Admitting: Anesthesiology

## 2023-07-07 NOTE — Anesthesia Preprocedure Evaluation (Signed)
Anesthesia Evaluation    Airway        Dental   Pulmonary           Cardiovascular hypertension,   08-20-16 - Left ventricle: The cavity size was normal. Wall thickness was    increased in a pattern of mild LVH. Systolic function was normal.    The estimated ejection fraction was in the range of 60% to 65%.    Wall motion was normal; there were no regional wall motion    abnormalities. Left ventricular diastolic function parameters    were normal.  - Left atrium: The atrium was mildly dilated.  - Atrial septum: No defect or patent foramen ovale was identified  Mild tricuspid regurg   Neuro/Psych    GI/Hepatic   Endo/Other  diabetes    Renal/GU      Musculoskeletal   Abdominal   Peds  Hematology   Anesthesia Other Findings Medical History  ALLERGIC RHINITIS BENIGN PROSTATIC HYPERTROPHY Adenomatous colon polyp  DIAB W/RENAL MANIFESTS  DIABETIC RETINOPATHY  DYSPHAGIA UNSPECIFIED ESOPHAGEAL STRICTURE  GERD HYPERLIPIDEMIA OSTEOARTHRITIS, LUMBAR SPINE URINARY CALCULUS Unspecified essential hypertension UNSPECIFIED ANEMIA PAIN IN SOFT TISSUES OF LIMB Chronic back pain Asthma Allergy Urine incontinence COPD (chronic obstructive pulmonary disease)   Diabetes mellitus Dysplastic nevus  Coronary artery disease History of kidney stones  Prostate cancer [facility])    Reproductive/Obstetrics                              Anesthesia Physical Anesthesia Plan Anesthesia Quick Evaluation

## 2023-07-08 ENCOUNTER — Encounter: Payer: Self-pay | Admitting: Ophthalmology

## 2023-07-09 NOTE — Discharge Instructions (Signed)

## 2023-07-12 ENCOUNTER — Encounter: Payer: Self-pay | Admitting: Family Medicine

## 2023-07-12 MED ORDER — RYBELSUS 3 MG PO TABS
3.0000 mg | ORAL_TABLET | Freq: Every day | ORAL | 3 refills | Status: DC
Start: 2023-07-12 — End: 2023-08-14

## 2023-07-12 NOTE — Assessment & Plan Note (Signed)
Chronic.  Elevated LDL Start Crestor 10 mg daily

## 2023-07-12 NOTE — Assessment & Plan Note (Signed)
Low Vitamin B 12 Start Vitamin B 12 1000 mcg daily s/l

## 2023-07-12 NOTE — Assessment & Plan Note (Signed)
Chronic.  Well controlled with increased dose of ARB Continue losartan to 50 mg nightly Continue to monitor blood pressure at home, goal less than 140/90 Strict return precautions provided

## 2023-07-12 NOTE — Assessment & Plan Note (Signed)
Chronic.  Having some hypoglycemic episodes without symptoms Previously Self discontinued metformin and Ozempic Continue Tresiba 40 units am  Decrease tresiba from 20 units to 18 units pm  Repeat A1c Start Rybelsus 3 mg daily.  Samples provided x 30 days Eye exam complete Foot exam complete Start statin Continue ARB.  BP well controlled

## 2023-07-13 ENCOUNTER — Ambulatory Visit: Admission: RE | Admit: 2023-07-13 | Payer: Medicare Other | Source: Home / Self Care | Admitting: Ophthalmology

## 2023-07-13 ENCOUNTER — Telehealth: Payer: Self-pay

## 2023-07-13 SURGERY — CATARACT EXTRACTION PHACO AND INTRAOCULAR LENS PLACEMENT (IOC)
Anesthesia: Topical | Laterality: Right

## 2023-07-13 NOTE — Progress Notes (Signed)
   Care Guide Note  07/13/2023 Name: [name] MRN: [id number] DOB: [birth year]  Referred by: Dana Allan, MD Reason for referral : Care Management (Outreach to schedule with pharm d )   [name] is a 77 y.o. year old male who is a primary care patient of Dana Allan, MD. [name] was referred to the pharmacist for assistance related to DM.    Successful contact was made with the patient to discuss pharmacy services including being ready for the pharmacist to call at least 5 minutes before the scheduled appointment time, to have medication bottles and any blood sugar or blood pressure readings ready for review. The patient agreed to meet with the pharmacist via with the pharmacist via telephone visit on (date/time).  07/21/2023  [name], RMA Care Guide [facility]  [place], [place] [address] Direct Dial: [phone] [name]

## 2023-07-21 ENCOUNTER — Other Ambulatory Visit: Payer: Medicare Other

## 2023-07-21 DIAGNOSIS — E118 Type 2 diabetes mellitus with unspecified complications: Secondary | ICD-10-CM

## 2023-07-21 NOTE — Progress Notes (Signed)
07/21/2023 Name: KOBI PLY MRN: [id number] DOB: 07/15/1946  Chief Complaint  Patient presents with   Diabetes   Hypertension   Hyperlipidemia    [name] is a 77 y.o. year old male who presented for a telephone visit.   They were referred to the pharmacist by their PCP for assistance in managing diabetes, hypertension, and hyperlipidemia.   Subjective:  Care Team: Primary Care Provider: Dana Allan, MD ; Next Scheduled Visit: needs to be scheduled  Medication Access/Adherence  Current Pharmacy:  [facility] [address], [place] - [address] [place] [address] Phone: [phone] Fax: [phone]  [facility] [phone] [place], [place] - [address] [place] [address] Phone: [phone] Fax: [phone]  [id number] [place], [place] - [address] [place] [address] Phone: [phone] Fax: [phone]   Patient reports affordability concerns with their medications: No  - has not been in the coverage gap before Patient reports access/transportation concerns to their pharmacy: No  Patient reports adherence concerns with their medications:  No    Reports that Rybelsus is currently affordable.   Had life difficulties since last June- was not sure where [place] was going [place] be [place] Was not going to the doctor. Got off track managing his medications and his DM.   Diabetes:  Current medications: Tresiba (insulin degludec) 40-50 units in the AM and 20-30 units at night (doses based on his blood sugars? Unclear what numbers leads to him changing the dose), Rybelsus (semaglutide) 3 mg daily (confirmed appropriate administration).   Medications tried in the past: metformin (has some of this medication left), Ozempic (semaglutide) - stopped when he was moving around last year.   Denies GI AE with Rybelsus, but does not think it is influencing his BG very  much. Does c/o of GERD chronic GERD symptoms and requests medication for this condition.   Current glucose readings: Pt reports that he does not have recent BG to share. States that he has had some BG 50-55 when he wakes up in the morning.  Using glucose meter; testing 2-3 times daily in the morning and evening before he takes [place].   Pt previously used FL2 CGM- which he liked. Would be interested in using again eventually.   Patient endorses hypoglycemic s/sx including dizziness, shakiness, sweating. Patient denies hyperglycemic symptoms including polyuria, polydipsia, polyphagia, nocturia, neuropathy, blurred vision.  Current meal patterns: 3 meals/day. Was eating 2 meals/day on Ozempic and lost significant amount of weight.  - Breakfast: bowl of cereal - did not discuss other meals in detail  Current medication access support: none  Hypertension:  Current medications: losartan 50 mg daily Medications previously tried: losartan/hydrochlorothiazide, metoprolol succinate  Current blood pressure readings readings: last clinic BP controlled at 126/70  Patient denies hypotensive s/sx including dizziness, lightheadedness.  Patient denies hypertensive symptoms including headache, chest pain, shortness of breath  Hyperlipidemia/ASCVD Risk Reduction  Current lipid lowering medications: rosuvastatin 10 mg daily Medications tried in the past: atorva 20mg  daily, simva 40  Antiplatelet regimen: ASA 81 mg daily  ASCVD History: CAD in problem list, but unsure of previous ASCVD event Risk Factors: T2DM, HLD, HTN, age   Objective:  Lab Results  Component Value Date   HGBA1C 9.8 (H) 05/26/2023    Lab Results  Component Value Date   CREATININE 0.96 05/26/2023   BUN 17 05/26/2023  NA 133 (L) 05/26/2023   K 4.5 05/26/2023   CL 96 05/26/2023   CO2 28 05/26/2023    Lab Results  Component Value Date   CHOL 144 05/26/2023   HDL 51.00 05/26/2023   LDLCALC 72 05/26/2023    LDLDIRECT 46.0 08/31/2018   TRIG 103.0 05/26/2023   CHOLHDL 3 05/26/2023    Medications Reviewed Today     Reviewed by Particia Lather, RPH (Pharmacist) on 07/21/23 at 1840  Med List Status: <None>   Medication Order Taking? Sig Documenting Provider Last Dose Status Informant  Ascorbic Acid (VITAMIN C) 1000 MG tablet [id number]  Take 500 mg by mouth daily. [provider]  Active Self, Pharmacy Records  aspirin 81 MG tablet [id number] Yes Take 81 mg by mouth daily. [provider] Taking Active Self, Pharmacy Records  calcium carbonate (OSCAL) 1500 (600 Ca) MG TABS tablet [id number]  Take 600 mg of elemental calcium by mouth daily with breakfast. [provider]  Active Self, Pharmacy Records  Cholecalciferol (VITAMIN D-3) 125 MCG (5000 UT) TABS [id number]  Take 5,000 Units by mouth daily. [provider]  Active Self, Pharmacy Records  Cyanocobalamin (VITAMIN B-12) 1000 MCG SUBL [id number]  Place 1 tablet (1,000 mcg total) under the tongue daily. Dana Allan, MD  Active   dutasteride (AVODART) 0.5 MG capsule [id number] Yes Take 0.5 mg by mouth daily. [provider] Taking Active Self, Pharmacy Records  insulin degludec (TRESIBA FLEXTOUCH) 200 UNIT/ML FlexTouch Pen [id number] Yes Inject 58 Units into the skin daily. Take 40 units in am and 18 units at night Dana Allan, MD Taking Active   Lancets [facility] ULTRASOFT) lancets [id number]  Use as instructed 2x daily Dx: 250.00 Romero Belling, MD  Active Self, Pharmacy Records  Lancets [facility] ULTRASOFT) lancets [id number]  Bid Use as instructed McLean-Scocuzza, Pasty Spillers, MD  Active   losartan (COZAAR) 50 MG tablet [id number] Yes TAKE 1 TABLET BY MOUTH EVERYDAY AT BEDTIME Dana Allan, MD Taking Active   losartan (COZAAR) 50 MG tablet [id number] Yes Take 1 tablet (50 mg total) by mouth at bedtime. Take another pill in afternoon if BP>130/>80. D/c 150 mg qd Dana Allan, MD Taking Active   MAGNESIUM PO [id number]  Take  1 tablet by mouth daily. [provider]  Active Self, Pharmacy Records  Na Sulfate-K Sulfate-Mg Sulf 17.5-3.13-1.6 GM/177ML SOLN [id number]  See admin instructions. [provider]  Active   Potassium 99 MG TABS [id number]  Take 99 mg by mouth daily. [provider]  Active Self, Pharmacy Records           Med Note [name] Nine Sep 05, 2021  1:43 PM)    rosuvastatin (CRESTOR) 10 MG tablet [id number] Yes Take 1 tablet (10 mg total) by mouth daily. Dana Allan, MD Taking Active   Semaglutide [facility]) 3 MG TABS [id number] Yes Take 1 tablet (3 mg total) by mouth daily. Dana Allan, MD Taking Active   tamsulosin [facility]) 0.4 MG CAPS capsule [id number] Yes Take 0.4 mg by mouth daily. [provider] Taking Active Self, Pharmacy Records  vitamin E 400 UNIT capsule [id number]  Take 400 Units by mouth daily. [provider]  Active Self, Pharmacy Records              Assessment/Plan:   Diabetes: - Currently uncontrolled with last A1c of 9.8% uncontrolled above goal <7%, and increased from previous A1c of 7.8% likely due to being disconnected from care and stopping medications. Pt  requires reduction in insulin dosage due to recent hypoglycemia. Unnecessary for patient to inject current dose of Tresiba U200 twice daily - instructed pt to transition to once daily dosing. Pt would like to restart his metformin as he previously tolerated this medication well - he has some unexpired supply remaining at home. He is tolerating Rybelsus 3 mg well and just picked up a 30 day supply. Will escalate to 7 mg daily at f/u (vs switching back to Ozempic) if pt continue to tolerate.  - Reviewed long term cardiovascular and renal outcomes of uncontrolled blood sugar - Reviewed goal A1c, goal fasting, and goal 2 hour post prandial glucose - Reviewed dietary modifications including minimizing carbohydrate heavy foods and emphasizing non-starchy vegetables and  protein at each meal.  - Reviewed lifestyle modifications including: staying hydrated with water - Recommend to DECREASE Tresiba U200 (insulin degludec) to 50 units daily (from 40 units in the AM + 20 units in the PM).   - Recommend to restart metformin XR 500 mg 1 tab daily x1 week then increase to 2 tab (1000 mg total) daily.  - Recommend to continue Rybelsus (semaglutide) 3 mg daily 30 min before food or medications with 4 oz of water. Will plan to escalate GLP-1RA therapy at f/u, since pt just picked up 30 DS.  - Recommend to check glucose twice daily fasting and 2 hr PPG. Consider sending Rx for FL3 at f/u if patient is agreeable to restarting CGM.  - Will re-assess need for patient assistance at f/u  Hypertension: - Currently controlled below goal <130/80 mmHg on losartan 50 mg daily.  - Reviewed long term cardiovascular and renal outcomes of uncontrolled blood pressure - Recommend to continue current regimen   Hyperlipidemia/ASCVD Risk Reduction: - Currently uncontrolled with LDL of 72 mg/dL above goal <84 mg/dL - though it appears pt was not taking statin at time of this lab draw.  - Recommend to continue rosuvastatin 10 mg daily.    Follow Up Plan: Pharmacist telephone 08/18/23  Nils Pyle, PharmD PGY1 Pharmacy Resident

## 2023-07-29 ENCOUNTER — Encounter: Payer: Self-pay | Admitting: Emergency Medicine

## 2023-07-30 MED ORDER — TRESIBA FLEXTOUCH 200 UNIT/ML ~~LOC~~ SOPN: 50.0000 [IU] | PEN_INJECTOR | Freq: Every day | SUBCUTANEOUS | 3 refills | Status: DC

## 2023-07-30 MED ORDER — METFORMIN HCL ER 500 MG PO TB24: 500.0000 mg | ORAL_TABLET | Freq: Two times a day (BID) | ORAL | 1 refills | Status: DC

## 2023-08-03 ENCOUNTER — Ambulatory Visit (INDEPENDENT_AMBULATORY_CARE_PROVIDER_SITE_OTHER): Payer: Medicare Other | Admitting: *Deleted

## 2023-08-03 ENCOUNTER — Encounter: Payer: PPO | Admitting: Dermatology

## 2023-08-03 ENCOUNTER — Telehealth: Payer: Self-pay | Admitting: *Deleted

## 2023-08-03 VITALS — BP 150/83 | Ht 72.0 in | Wt 185.0 lb

## 2023-08-03 DIAGNOSIS — Z0000 Encounter for general adult medical examination without abnormal findings: Secondary | ICD-10-CM

## 2023-08-03 NOTE — Telephone Encounter (Signed)
Dr. Clent Ridges is out of the office today.

## 2023-08-03 NOTE — Telephone Encounter (Signed)
Called patient to perform his Medicare Wellness visit today. Patient's blood pressure was 150/83 pulse 94 per patient. Patient stated that his blood pressure has been elevated in the mornings. Patient is wondering if his blood pressure medication should be increased. Patient checked his blood pressure at the end of the visit and it was 145/61. Patient stated that he took his blood pressure medication about an hour ago. Patient is not having any symptoms. Pharmacy [facility]

## 2023-08-03 NOTE — Telephone Encounter (Signed)
Noted. Patient needs to be scheduled with his PCP for his BP.

## 2023-08-03 NOTE — Patient Instructions (Signed)
[name] , Thank you for taking time to come for your Medicare Wellness Visit. I appreciate your ongoing commitment to your health goals. Please review the following plan we discussed and let me know if I can assist you in the future.   Referrals/Orders/Follow-Ups/Clinician Recommendations: None  This is a list of the screening recommended for you and due dates:  Health Maintenance  Topic Date Due   Zoster (Shingles) Vaccine (1 of 2) Never done   COVID-19 Vaccine (3 - Moderna risk series) 04/16/2021   Flu Shot  06/25/2023   Hemoglobin A1C  11/26/2023   Yearly kidney function blood test for diabetes  05/25/2024   Yearly kidney health urinalysis for diabetes  05/25/2024   Eye exam for diabetics  06/24/2024   Complete foot exam   06/29/2024   Medicare Annual Wellness Visit  08/02/2024   DTaP/Tdap/Td vaccine (3 - Td or Tdap) 08/31/2028   Pneumonia Vaccine  Completed   Hepatitis C Screening  Completed   HPV Vaccine  Aged Out   Colon Cancer Screening  Discontinued    Advanced directives: (Declined) Advance directive discussed with you today. Even though you declined this today, please call our office should you change your mind, and we can give you the proper paperwork for you to fill out. Will get paperwork from the office.  Next Medicare Annual Wellness Visit scheduled for next year: Yes 08/03/24 @ 9:45

## 2023-08-03 NOTE — Progress Notes (Signed)
Subjective:   [name] is a 77 y.o. male who presents for Medicare Annual/Subsequent preventive examination.  Visit Complete: Virtual  I connected with  Lilli Light on 08/03/23 by a audio enabled telemedicine application and verified that I am speaking with the correct person using two identifiers.  Patient Location: Home  Provider Location: Office/Clinic  I discussed the limitations of evaluation and management by telemedicine. The patient expressed understanding and agreed to proceed.  Vital Signs: Unable to obtain new vitals due to this being a telehealth visit. Patient was able to give blood pressure reading which has been documented.   Review of Systems     Cardiac Risk Factors include: advanced age (>43men, >80 women);dyslipidemia;male gender;diabetes mellitus;hypertension;Other (see comment), Risk factor comments: CAD     Objective:    Today's Vitals   08/03/23 0945  BP: (!) 150/83  Weight: 185 lb (83.9 kg)  Height: 6' (1.829 m)   Body mass index is 25.09 kg/m.     08/03/2023   10:06 AM 04/24/2022    4:26 PM 12/05/2021    8:20 AM 12/02/2021    3:39 PM 11/29/2021   10:54 AM 03/13/2021   12:30 PM 09/04/2020    9:43 AM  Advanced Directives  Does Patient Have a Medical Advance Directive? No No  No No No No  Would patient like information on creating a medical advance directive? No - Patient declined No - Patient declined No - Patient declined No - Patient declined No - Patient declined  No - Patient declined    Current Medications (verified) Outpatient Encounter Medications as of 08/03/2023  Medication Sig   Ascorbic Acid (VITAMIN C) 1000 MG tablet Take 500 mg by mouth daily.   aspirin 81 MG tablet Take 81 mg by mouth daily.   calcium carbonate (OSCAL) 1500 (600 Ca) MG TABS tablet Take 600 mg of elemental calcium by mouth daily with breakfast.   Cholecalciferol (VITAMIN D-3) 125 MCG (5000 UT) TABS Take 5,000 Units by mouth daily.   dutasteride (AVODART) 0.5 MG  capsule Take 0.5 mg by mouth daily.   insulin degludec (TRESIBA FLEXTOUCH) 200 UNIT/ML FlexTouch Pen Inject 50 Units into the skin daily.   Lancets (ONETOUCH ULTRASOFT) lancets Use as instructed 2x daily Dx: 250.00   Lancets (ONETOUCH ULTRASOFT) lancets Bid Use as instructed   losartan (COZAAR) 50 MG tablet TAKE 1 TABLET BY MOUTH EVERYDAY AT BEDTIME   losartan (COZAAR) 50 MG tablet Take 1 tablet (50 mg total) by mouth at bedtime. Take another pill in afternoon if BP>130/>80. D/c 150 mg qd   MAGNESIUM PO Take 1 tablet by mouth daily.   metFORMIN (GLUCOPHAGE-XR) 500 MG 24 hr tablet Take 1 tablet (500 mg total) by mouth 2 (two) times daily with a meal.   Na Sulfate-K Sulfate-Mg Sulf 17.5-3.13-1.6 GM/177ML SOLN See admin instructions.   Potassium 99 MG TABS Take 99 mg by mouth daily.   rosuvastatin (CRESTOR) 10 MG tablet Take 1 tablet (10 mg total) by mouth daily.   Semaglutide (RYBELSUS) 3 MG TABS Take 1 tablet (3 mg total) by mouth daily.   tamsulosin (FLOMAX) 0.4 MG CAPS capsule Take 0.4 mg by mouth daily.   vitamin E 400 UNIT capsule Take 400 Units by mouth daily.   Cyanocobalamin (VITAMIN B-12) 1000 MCG SUBL Place 1 tablet (1,000 mcg total) under the tongue daily. (Patient not taking: Reported on 08/03/2023)   No facility-administered encounter medications on file as of 08/03/2023.    Allergies (verified)  Sulfamethoxazole-trimethoprim   History: 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   DYSPHAGIA UNSPECIFIED 09/29/2008   Dysplastic nevus 02/20/2021   L post thigh - mild     ESOPHAGEAL STRICTURE 12/28/2008   GERD 11/28/2008   History of kidney stones    HYPERLIPIDEMIA 08/03/2007   OSTEOARTHRITIS, 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   PAIN IN SOFT TISSUES OF LIMB 01/07/2008   Prostate cancer (HCC)    UNSPECIFIED ANEMIA 12/29/2007   Unspecified essential hypertension 01/07/2008   URINARY CALCULUS 09/29/2008   Urine incontinence    Past Surgical History:  Procedure Laterality Date   CHOLECYSTECTOMY N/A 11/12/2016   Procedure: LAPAROSCOPIC CHOLECYSTECTOMY WITH INTRAOPERATIVE CHOLANGIOGRAM;  Surgeon: Ricarda Frame, MD;  Location: [facility] ORS;  Service: General;  Laterality: N/A;   COLONOSCOPY     polyp   dupyretens contracture     right hand surgery,left hand    HAND SURGERY Right    2016   LITHOTRIPSY     POLYPECTOMY     PROSTATE BIOPSY N/A 12/05/2021   Procedure: PROSTATE BIOPSY Addison Bailey;  Surgeon: Orson Ape, MD;  Location: [facility] ORS;  Service: Urology;  Laterality: N/A;   SHOULDER SURGERY Left    left, cyst and tumor removed   URINARY SURGERY  2018   Dr Diamantina Monks "stretched" urethra around prostate to increase urine flow   Family History  Problem Relation Age of Onset   Diabetes Mother    Depression Mother    Prostate cancer Brother    Diabetes Brother    Heart disease Brother    Diabetes Brother    Colon cancer Neg Hx    Esophageal cancer Neg Hx    Rectal cancer Neg Hx    Stomach cancer Neg Hx    Colon polyps Neg Hx    Crohn's disease Neg Hx    Social History   Socioeconomic History   Marital status: Married    Spouse name: Not on file   Number of children: 2   Years of education: Not on file   Highest education level: Not on file  Occupational History   Occupation: Metal Fabrication    Employer: [employer]  Tobacco Use   Smoking status: Never    Passive exposure: Past   Smokeless tobacco: Never  Vaping Use   Vaping status: Never Used  Substance and Sexual Activity   Alcohol use: Yes    Comment: occasional beer   Drug use: No    Sexual activity: Not on file  Other Topics Concern   Not on file  Social History Narrative   Daily Caffeine Use 1-2 daily   Married    Never smoker    Wears seat belt, safe in relationship    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, retired    International aid/development worker of Corporate investment banker Strain: Low Risk  (08/03/2023)   Overall Financial Resource Strain (CARDIA)    Difficulty of Paying Living Expenses: Not hard at all  Food Insecurity: No Food Insecurity (08/03/2023)   Hunger Vital Sign    Worried  About Running Out of Food in the Last Year: Never true    Ran Out of Food in the Last Year: Never true  Transportation Needs: No Transportation Needs (08/03/2023)   PRAPARE - Administrator, Civil Service (Medical): No    Lack of Transportation (Non-Medical): No  Physical Activity: Inactive (08/03/2023)   Exercise Vital Sign    Days of Exercise per Week: 0 days    Minutes of Exercise per Session: 0 min  Stress: No Stress Concern Present (08/03/2023)   [employer] of Occupational Health - Occupational Stress Questionnaire    Feeling of Stress : Only a little  Social Connections: Moderately Integrated (08/03/2023)   Social Connection and Isolation Panel [NHANES]    Frequency of Communication with Friends and Family: More than three times a week    Frequency of Social Gatherings with Friends and Family: Once a week    Attends Religious Services: More than 4 times per year    Active Member of [employer] or Organizations: No    Attends Engineer, structural: Never    Marital Status: Married    Tobacco Counseling Counseling given: Not Answered   Clinical Intake:  Pre-visit preparation completed: Yes  Pain : No/denies pain     BMI - recorded: 25.09 Nutritional Status: BMI 25 -29 Overweight Nutritional Risks: None Diabetes: Yes CBG done?: Yes (FBS 96) CBG resulted in Enter/ Edit results?: No Did pt. bring in CBG monitor from home?: No  How often do you need to have someone help you  when you read instructions, pamphlets, or other written materials from your doctor or pharmacy?: 1 - Never  Interpreter Needed?: No  Information entered by :: R. Suhani Stillion LPN   Activities of Daily Living    08/03/2023    9:51 AM  In your present state of health, do you have any difficulty performing the following activities:  Hearing? 1  Comment needs hearing aids  Vision? 0  Comment glasses  Difficulty concentrating or making decisions? 1  Comment has cataracts  Walking or climbing stairs? 0  Dressing or bathing? 0  Doing errands, shopping? 0  Preparing Food and eating ? N  Using the Toilet? N  In the past six months, have you accidently leaked urine? N  Do you have problems with loss of bowel control? N  Managing your Medications? N  Managing your Finances? N  Housekeeping or managing your Housekeeping? N    Patient Care Team: Dana Allan, MD as PCP - General (Family Medicine)  Indicate any recent Medical Services you may have received from other than [facility] providers in the past year (date may be approximate).     Assessment:   This is a routine wellness examination for Davelle.  Hearing/Vision screen Hearing Screening - Comments:: Needs hearing aids Vision Screening - Comments:: Has cataracts   Goals Addressed             This Visit's Progress    Patient Stated       Start back a walking program        Depression Screen    08/03/2023   10:01 AM 06/30/2023    3:49 PM 05/26/2023   10:22 AM 06/27/2022   10:13 AM 04/29/2022    1:54 PM 04/04/2022    9:11 AM 03/17/2022    3:58 PM  PHQ 2/9 Scores  PHQ - 2 Score 0 0 2 0 0 0 0  PHQ- 9 Score 3 4 5  Fall Risk    08/03/2023    9:53 AM 06/30/2023    3:49 PM 05/26/2023   10:22 AM 06/27/2022   10:13 AM 04/29/2022    1:54 PM  Fall Risk   Falls in the past year? 0 0 0 1 1  Number falls in past yr: 0 0 0 0 1  Injury with Fall? 0 1 0 0 0  Risk for fall due to : No Fall Risks No Fall Risks No Fall Risks  History of fall(s)   Follow up Falls prevention discussed;Falls evaluation completed Falls evaluation completed Falls evaluation completed  Falls evaluation completed    MEDICARE RISK AT HOME: Medicare Risk at Home Any stairs in or around the home?: No If so, are there any without handrails?: No Home free of loose throw rugs in walkways, pet beds, electrical cords, etc?: Yes Adequate lighting in your home to reduce risk of falls?: Yes Life alert?: No Use of a cane, walker or w/c?: No Grab bars in the bathroom?: No Shower chair or bench in shower?: No Elevated toilet seat or a handicapped toilet?: No   Cognitive Function:        08/03/2023   10:07 AM 09/04/2020    9:59 AM 09/02/2019    9:54 AM  6CIT Screen  What Year? 0 points 0 points 0 points  What month? 0 points 0 points 0 points  What time? 0 points 0 points 0 points  Count back from 20 0 points  0 points  Months in reverse 2 points  0 points  Repeat phrase 0 points  0 points  Total Score 2 points  0 points    Immunizations Immunization History  Administered Date(s) Administered   Fluad Quad(high Dose 65+) 07/27/2019, 08/01/2020, 10/01/2021   Influenza Whole 08/24/2009, 08/25/2011   Influenza, High Dose Seasonal PF 08/21/2016, 08/25/2017, 08/31/2018   Influenza,inj,Quad PF,6+ Mos 10/29/2015   Influenza-Unspecified 10/29/2015   Moderna SARS-COV2 Booster Vaccination 11/24/2020, 03/19/2021   Moderna Sars-Covid-2 Vaccination 01/05/2020, 02/02/2020   PNEUMOCOCCAL CONJUGATE-20 06/27/2022   Pneumococcal Conjugate-13 01/23/2017   Pneumococcal Polysaccharide-23 09/25/2003, 03/27/2015   Td 09/25/2003   Tdap 08/31/2018    TDAP status: Up to date  Flu Vaccine status: Due, Education has been provided regarding the importance of this vaccine. Advised may receive this vaccine at local pharmacy or Health Dept. Aware to provide a copy of the vaccination record if obtained from local pharmacy or Health Dept. Verbalized acceptance and  understanding.  Pneumococcal vaccine status: Up to date  Covid-19 vaccine status: Completed vaccines  Qualifies for Shingles Vaccine? Yes   Zostavax completed Yes  Patient stated that he had the two and will get the records to the office Shingrix Completed?: No.    Education has been provided regarding the importance of this vaccine. Patient has been advised to call insurance company to determine out of pocket expense if they have not yet received this vaccine. Advised may also receive vaccine at local pharmacy or Health Dept. Verbalized acceptance and understanding.  Screening Tests Health Maintenance  Topic Date Due   Zoster Vaccines- Shingrix (1 of 2) Never done   COVID-19 Vaccine (3 - Moderna risk series) 04/16/2021   Medicare Annual Wellness (AWV)  12/02/2022   INFLUENZA VACCINE  06/25/2023   HEMOGLOBIN A1C  11/26/2023   Diabetic kidney evaluation - eGFR measurement  05/25/2024   Diabetic kidney evaluation - Urine ACR  05/25/2024   OPHTHALMOLOGY EXAM  06/24/2024   FOOT EXAM  06/29/2024  DTaP/Tdap/Td (3 - Td or Tdap) 08/31/2028   Pneumonia Vaccine 47+ Years old  Completed   Hepatitis C Screening  Completed   HPV VACCINES  Aged Out   Colonoscopy  Discontinued    Health Maintenance  Health Maintenance Due  Topic Date Due   Zoster Vaccines- Shingrix (1 of 2) Never done   COVID-19 Vaccine (3 - Moderna risk series) 04/16/2021   Medicare Annual Wellness (AWV)  12/02/2022   INFLUENZA VACCINE  06/25/2023    Colorectal cancer screening: No longer required.   Lung Cancer Screening: (Low Dose CT Chest recommended if Age 59-80 years, 20 pack-year currently smoking OR have quit w/in 15years.) does not qualify.    Additional Screening:  Hepatitis C Screening: does qualify; Completed 8/17  Vision Screening: Recommended annual ophthalmology exams for early detection of glaucoma and other disorders of the eye. Is the patient up to date with their annual eye exam?  Yes  Who is  the provider or what is the name of the office in which the patient attends annual eye exams? [facility], getting ready to have cataract surgery If pt is not established with a provider, would they like to be referred to a provider to establish care? No .   Dental Screening: Recommended annual dental exams for proper oral hygiene  Diabetic Foot Exam: Diabetic Foot Exam: Completed 8/24  Community Resource Referral / Chronic Care Management: CRR required this visit?  No   CCM required this visit?  No     Plan:     I have personally reviewed and noted the following in the patient's chart:   Medical and social history Use of alcohol, tobacco or illicit drugs  Current medications and supplements including opioid prescriptions. Patient is not currently taking opioid prescriptions. Functional ability and status Nutritional status Physical activity Advanced directives List of other physicians Hospitalizations, surgeries, and ER visits in previous 12 months Vitals Screenings to include cognitive, depression, and falls Referrals and appointments  In addition, I have reviewed and discussed with patient certain preventive protocols, quality metrics, and best practice recommendations. A written personalized care plan for preventive services as well as general preventive health recommendations were provided to patient.     Sydell Axon, LPN   4/0/3474   After Visit Summary: (MyChart) Due to this being a telephonic visit, the after visit summary with patients personalized plan was offered to patient via MyChart   Nurse Notes: Bloop pressure has been elevated. Will send phone note to PCP.

## 2023-08-04 NOTE — Telephone Encounter (Signed)
Noted  

## 2023-08-04 NOTE — Telephone Encounter (Signed)
Patient states he is returning our call.  I read message from [facility], CMA, to patient.  I scheduled patient for an appointment to see Dr. Dana Allan regarding his blood pressure.

## 2023-08-04 NOTE — Telephone Encounter (Signed)
Left message to return call to our office.  Pt needs an appointment with Clent Ridges about BP

## 2023-08-11 ENCOUNTER — Encounter: Admission: RE | Payer: Self-pay | Source: Home / Self Care

## 2023-08-11 ENCOUNTER — Ambulatory Visit: Admission: RE | Admit: 2023-08-11 | Payer: Medicare Other | Source: Home / Self Care | Admitting: Ophthalmology

## 2023-08-11 SURGERY — CATARACT EXTRACTION PHACO AND INTRAOCULAR LENS PLACEMENT (IOC)
Anesthesia: Topical | Laterality: Left

## 2023-08-14 ENCOUNTER — Ambulatory Visit (INDEPENDENT_AMBULATORY_CARE_PROVIDER_SITE_OTHER): Payer: Medicare Other | Admitting: Family Medicine

## 2023-08-14 ENCOUNTER — Encounter: Payer: Self-pay | Admitting: Family Medicine

## 2023-08-14 VITALS — BP 138/68 | HR 80 | Temp 98.2°F | Resp 16 | Ht 73.0 in | Wt 190.2 lb

## 2023-08-14 DIAGNOSIS — E1169 Type 2 diabetes mellitus with other specified complication: Secondary | ICD-10-CM

## 2023-08-14 DIAGNOSIS — N400 Enlarged prostate without lower urinary tract symptoms: Secondary | ICD-10-CM

## 2023-08-14 DIAGNOSIS — E11319 Type 2 diabetes mellitus with unspecified diabetic retinopathy without macular edema: Secondary | ICD-10-CM

## 2023-08-14 DIAGNOSIS — I152 Hypertension secondary to endocrine disorders: Secondary | ICD-10-CM

## 2023-08-14 DIAGNOSIS — E1159 Type 2 diabetes mellitus with other circulatory complications: Secondary | ICD-10-CM

## 2023-08-14 DIAGNOSIS — E1165 Type 2 diabetes mellitus with hyperglycemia: Secondary | ICD-10-CM

## 2023-08-14 DIAGNOSIS — E118 Type 2 diabetes mellitus with unspecified complications: Secondary | ICD-10-CM | POA: Diagnosis not present

## 2023-08-14 DIAGNOSIS — E785 Hyperlipidemia, unspecified: Secondary | ICD-10-CM

## 2023-08-14 LAB — COMPREHENSIVE METABOLIC PANEL
ALT: 17 U/L (ref 0–53)
AST: 19 U/L (ref 0–37)
Albumin: 4.2 g/dL (ref 3.5–5.2)
Alkaline Phosphatase: 56 U/L (ref 39–117)
BUN: 20 mg/dL (ref 6–23)
CO2: 29 mEq/L (ref 19–32)
Calcium: 10.4 mg/dL (ref 8.4–10.5)
Chloride: 100 mEq/L (ref 96–112)
Creatinine, Ser: 1.08 mg/dL (ref 0.40–1.50)
GFR: 67.25 mL/min (ref >60.00)
Glucose, Bld: 161 mg/dL — ABNORMAL HIGH (ref 70–99)
Potassium: 4.3 mEq/L (ref 3.5–5.1)
Sodium: 137 mEq/L (ref 135–145)
Total Bilirubin: 0.8 mg/dL (ref 0.2–1.2)
Total Protein: 7 g/dL (ref 6.0–8.3)

## 2023-08-14 MED ORDER — LOSARTAN POTASSIUM 50 MG PO TABS
50.0000 mg | ORAL_TABLET | Freq: Two times a day (BID) | ORAL | 2 refills | Status: DC
Start: 2023-08-14 — End: 2024-02-04

## 2023-08-14 MED ORDER — RYBELSUS 7 MG PO TABS: 7.0000 mg | ORAL_TABLET | Freq: Every day | ORAL | 3 refills | Status: DC

## 2023-08-14 MED ORDER — TAMSULOSIN HCL 0.4 MG PO CAPS
0.4000 mg | ORAL_CAPSULE | Freq: Every day | ORAL | 3 refills | Status: DC
Start: 2023-08-14 — End: 2024-03-29

## 2023-08-14 MED ORDER — DUTASTERIDE 0.5 MG PO CAPS
0.5000 mg | ORAL_CAPSULE | Freq: Every day | ORAL | 3 refills | Status: DC
Start: 2023-08-14 — End: 2024-03-29

## 2023-08-14 MED ORDER — RYBELSUS 7 MG PO TABS: 7.0000 mg | ORAL_TABLET | Freq: Every day | ORAL | 0 refills | Status: DC

## 2023-08-14 NOTE — Progress Notes (Signed)
Marland Kitchen  SUBJECTIVE:   Chief Complaint  Patient presents with   Hypertension   HPI Patient presents to clinic for follow up chronic disease management  Type 2 diabetes Now taking Tresiba 50 units daily.  No further episodes of hypoglycemia. Rybelsus was started at last visit. Tolerating well.   On stating and ARB  Hypertension Asymptomatic.  Reports elevated BP at home.  Range 130-150/75/79.  Self increased Losartan from 50 mg daily to BID.  Tolerating increase in medication. BP goal <140/90  Hyperlipidemia Recently started Crestor 10 mg daily and tolerating well.   PERTINENT PMH / PSH: DM type II Hypertension Hyperlipidemia BPH   OBJECTIVE:  BP 138/68   Pulse 80   Temp 98.2 F (36.8 C)   Resp 16   Ht 6\' 1"  (1.854 m)   Wt 190 lb 4 oz (86.3 kg)   SpO2 99%   BMI 25.10 kg/m    Physical Exam     08/14/2023    8:59 AM 08/03/2023   10:01 AM 06/30/2023    3:49 PM 05/26/2023   10:22 AM 06/27/2022   10:13 AM  Depression screen PHQ 2/9  Decreased Interest 0 0 0 2 0  Down, Depressed, Hopeless 0 0 0 0 0  PHQ - 2 Score 0 0 0 2 0  Altered sleeping 2 2 2 2    Tired, decreased energy 0 1 2 1    Change in appetite 0 0 0 0   Feeling bad or failure about yourself  0 0 0 0   Trouble concentrating 0 0 0 0   Moving slowly or fidgety/restless 0 0 0 0   Suicidal thoughts 0 0 0 0   PHQ-9 Score 2 3 4 5    Difficult doing work/chores Not difficult at all Not difficult at all Not difficult at all Somewhat difficult       08/14/2023    8:59 AM 06/30/2023    3:50 PM 05/26/2023   10:22 AM 05/24/2020    2:49 PM  GAD 7 : Generalized Anxiety Score  Nervous, Anxious, on Edge 0 0 0 0  Control/stop worrying 0 0 0 0  Worry too much - different things 0 0 0 0  Trouble relaxing 0 0 0 0  Restless 0 0 0 0  Easily annoyed or irritable 0 0 0 0  Afraid - awful might happen 0 0 0 0  Total GAD 7 Score 0 0 0 0  Anxiety Difficulty Not difficult at all Not difficult at all Not difficult at all Not difficult at  all    ASSESSMENT/PLAN:  Benign prostatic hyperplasia, unspecified whether lower urinary tract symptoms present Assessment & Plan: Refill Finasteride Refill Flomax  Orders: -     Dutasteride; Take 1 capsule (0.5 mg total) by mouth daily.  Dispense: 90 capsule; Refill: 3 -     Tamsulosin HCl; Take 1 capsule (0.4 mg total) by mouth daily.  Dispense: 90 capsule; Refill: 3  Type 2 diabetes mellitus with complications (HCC) Assessment & Plan: Chronic.  Continue Tresiba 50 units daily Increase Rybelsus 7 mg daily.  Eye exam complete Foot exam complete Continue statin Continue ARB.  BP well controlled Follow up in 3 months   Orders: -     Comprehensive metabolic panel -     Rybelsus; Take 1 tablet (7 mg total) by mouth daily. (Patient not taking: Reported on 08/18/2023)  Dispense: 30 tablet; Refill: 3  Hypertension associated with diabetes (HCC) Assessment & Plan: Chronic. Stable.  Goal <  140/90 per JNC 8  Continue losartan to 50 mg BID, self increased dose Check Cmet Continue to monitor blood pressure at home, goal less than 140/90 Strict return precautions provided  Orders: -     Losartan Potassium; Take 1 tablet (50 mg total) by mouth in the morning and at bedtime.  Dispense: 180 tablet; Refill: 2  Hyperlipidemia associated with type 2 diabetes mellitus (HCC) Assessment & Plan: Chronic.  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  Followed by Dr. Russella Dar.  No indication for further colon cancer screening per GI.  PDMP reviewed  Return in about 3 months (around 11/13/2023) for PCP.  Dana Allan, MD

## 2023-08-14 NOTE — Patient Instructions (Addendum)
It was a pleasure meeting you today. Thank you for allowing me to take part in your health care.  Our goals for today as we discussed include:  Increase Rybelsus to 7 mg daily Continue Triesba 50 units daily Continue Metformin 500 mg daily.  Recommend increasing dose  Continue Losartan 50 mg two times a day  Stop potassium supplements  Modify diet  Follow up in Dec    If you have any questions or concerns, please do not hesitate to call the office at [phone].  I look forward to our next visit and until then take care and stay safe.  Regards,   Dana Allan, MD   [facility]

## 2023-08-18 ENCOUNTER — Encounter: Payer: Self-pay | Admitting: Ophthalmology

## 2023-08-18 ENCOUNTER — Other Ambulatory Visit: Payer: Medicare Other

## 2023-08-18 DIAGNOSIS — E118 Type 2 diabetes mellitus with unspecified complications: Secondary | ICD-10-CM

## 2023-08-18 MED ORDER — FREESTYLE LIBRE 2 SENSOR MISC: 5 refills | Status: DC

## 2023-08-18 NOTE — Progress Notes (Signed)
08/18/2023 Name: [name] MRN: [id number] DOB: 02-06-1946  Chief Complaint  Patient presents with   Diabetes Mellitus    [name] is a 77 y.o. year old male who presented for a telephone visit.   They were referred to the pharmacist by their PCP for assistance in managing diabetes.   Subjective: Pt reports doing well.   Care Team: Primary Care Provider: Dana Allan, MD ; Next Scheduled Visit: 09/30/23   Medication Access/Adherence  Current Pharmacy:  [facility] [phone] [place], [place] - [address] [place] [address] Phone: [phone] Fax: [phone]  [facility] [phone] [place], [place] - [address] [place] [address] Phone: [phone] Fax: [phone]  [id number] - [place],  - [address] [place] [address] Phone: [phone] Fax: [phone]   Patient reports affordability concerns with their medications: No  - States that Rybelsus is affordable at this time.  Patient reports access/transportation concerns to their pharmacy: No  Patient reports adherence concerns with their medications:  No     Diabetes:  Current medications: Tresiba (insulin degludec) 50 units daily and additional 20 units in the evening, Rybelsus (semaglutide) 7 mg daily, metformin XR 500 mg daily  In the afternoon or evening takes [place] 20 units in the afternoon. Tried to do 50 units once a day as discussed at previous visits, but states that his BG ran too high and he restarted the 20 units in the evening. Does not remember specific values.   Medications tried in the past: Ozempic (semaglutide) - stopped when he was moving around last year.    Reports that he is having more acid reflux - happens when he eats meat (fried chicken, ham). Does not occur when he eats grilled chicken salads. Endorses burning pain, feeling like its hard to swallow. He thinks it is associated with rosuvastatin, but  he did start taking rosuvastatin and Rybelsus around the same time.   Current glucose readings: Pt unable to review history of BG monitor Using unknown meter; testing two times daily before meals FBG 90s Denies BG >200 (before or <70 mg/dL  Pt interested in using FL sensor again. Has FL2 reader dispensed in 2022, but needs to make sure he can locate it.   Patient reports hypoglycemic s/sx including dizziness, lightheadedness (he believes this is due to low blood pressure, not low blood sugar- but has not checked either when these symptoms occur). Patient denies hyperglycemic symptoms including polyuria, polydipsia, polyphagia, nocturia, neuropathy, blurred vision.  Current meal patterns: 3 meals/day. Was eating 2 meals/day on Ozempic and lost significant amount of weight.  - Breakfast: bowl of cereal  - Lunch: does not always eat lunch - Supper: Usually has grilled chicken (or air fried chicken) salads twice a week.  Current physical activity: yard work, almost daily  Current medication access support: none  Hypertension:  Current medications: losartan 50 mg   Patient has a validated, automated, upper arm home BP cuff Current blood pressure readings readings:  140s/70s usually, under 150s.   Patient reports hypotensive s/sx including dizziness, lightheadedness when he goes outside to do yardwork.  Patient denies hypertensive symptoms including headache, chest pain, shortness of breath   Objective:  Lab Results  Component Value Date   HGBA1C 9.8 (H) 05/26/2023    Lab Results  Component Value Date   CREATININE 1.08 08/14/2023   BUN 20 08/14/2023   NA  137 08/14/2023   K 4.3 08/14/2023   CL 100 08/14/2023   CO2 29 08/14/2023    Lab Results  Component Value Date   CHOL 144 05/26/2023   HDL 51.00 05/26/2023   LDLCALC 72 05/26/2023   LDLDIRECT 46.0 08/31/2018   TRIG 103.0 05/26/2023   CHOLHDL 3 05/26/2023    Medications Reviewed Today     Reviewed by Particia Lather,  RPH (Pharmacist) on 08/18/23 at 1837  Med List Status: <None>   Medication Order Taking? Sig Documenting Provider Last Dose Status Informant  Ascorbic Acid (VITAMIN C) 1000 MG tablet [id number]  Take 500 mg by mouth daily. [provider]  Active Self, Pharmacy Records  aspirin 81 MG tablet [id number]  Take 81 mg by mouth daily. [provider]  Active Self, Pharmacy Records  calcium carbonate (OSCAL) 1500 (600 Ca) MG TABS tablet [id number]  Take 600 mg of elemental calcium by mouth daily with breakfast. [provider]  Active Self, Pharmacy Records  Cholecalciferol (VITAMIN D-3) 125 MCG (5000 UT) TABS [id number]  Take 5,000 Units by mouth daily. [provider]  Active Self, Pharmacy Records  Cyanocobalamin (VITAMIN B-12) 1000 MCG SUBL [id number]  Place 1 tablet (1,000 mcg total) under the tongue daily. Dana Allan, MD  Active   dutasteride (AVODART) 0.5 MG capsule [id number]  Take 1 capsule (0.5 mg total) by mouth daily. Dana Allan, MD  Active   insulin degludec (TRESIBA FLEXTOUCH) 200 UNIT/ML FlexTouch Pen [id number] Yes Inject 50 Units into the skin daily. Dana Allan, MD Taking Active            Med Note Particia Lather   Tue Aug 18, 2023  6:20 PM) Pt taking 50 units in the AM and 20 units in the evening.   Lancets [facility] ULTRASOFT) lancets [id number]  Use as instructed 2x daily Dx: 250.00 Romero Belling, MD  Active Self, Pharmacy Records  Lancets [facility] ULTRASOFT) lancets [id number]  Bid Use as instructed McLean-Scocuzza, Pasty Spillers, MD  Active   losartan (COZAAR) 50 MG tablet [id number] Yes Take 1 tablet (50 mg total) by mouth in the morning and at bedtime. Dana Allan, MD Taking Active   MAGNESIUM PO [id number]  Take 1 tablet by mouth daily. [provider]  Active Self, Pharmacy Records  metFORMIN (GLUCOPHAGE-XR) 500 MG 24 hr tablet [id number] Yes Take 1 tablet (500 mg total) by mouth 2 (two) times daily with a meal. Dana Allan, MD Taking Active   Na  Sulfate-K Sulfate-Mg Sulf 17.5-3.13-1.6 GM/177ML SOLN [id number]  See admin instructions. [provider]  Active   rosuvastatin (CRESTOR) 10 MG tablet [id number] Yes Take 1 tablet (10 mg total) by mouth daily. Dana Allan, MD Taking Active   Semaglutide [facility]) 7 MG TABS [id number] No Take 1 tablet (7 mg total) by mouth daily.  Patient not taking: Reported on 08/18/2023   Dana Allan, MD Not Taking Active            Med Note Particia Lather   Tue Aug 18, 2023  6:20 PM) Still taking 3 mg tablets - will pick up 7 mg next time.  tamsulosin (FLOMAX) 0.4 MG CAPS capsule [id number]  Take 1 capsule (0.4 mg total) by mouth daily. Dana Allan, MD  Active   vitamin E 400 UNIT capsule [id number]  Take 400 Units by mouth daily. [provider]  Active Self, Pharmacy Records              Assessment/Plan:   Diabetes: -  Currently uncontrolled with last A1c of 9.8% uncontrolled above goal <7%, though improved per pt reported BG. Pt has self-increased his insulin, after we implemented a 20% decrease at previous telephone appt. He denies low blood sugars, but describes symptoms of low blood sugars (dizzy, lightheaded) after being active/doing yard work. Reiterated long duration of Tresiba and that taking two separate doses is unnecessary. Pt would like to continue current regimen and start checking BG when he feels dizzy. Pt also reports severe acid reflux, including difficulty swallowing - which could be associated with GLP-1 RA. Counseled patient on reducing portion sizes, avoiding greasy fried foods to reduce symptoms. Suggested alternative option of holding medication for 3-4 days to see if symptoms improve. Pt elected to hold medication. Scheduled f/u call in 2 weeks given multiple changes and concern for hypoglycemia. Pt is an excellent candidate for CGM - sent in Rx for FL2 sensors. Will confirm pt is able to find FL2 reader at f/u appt.  - Reviewed long term cardiovascular and renal  outcomes of uncontrolled blood sugar - Reviewed goal A1c, goal fasting, and goal 2 hour post prandial glucose - Reviewed dietary modifications including avoiding greasy fried foods to reduce acid reflux symptoms - Recommend to continue insulin degludec Evaristo Bury) 50 units daily, metformin XR 500 mg BID - Recommend to hold Rybelsus (semaglutide) for 3-4 days to assess impact on acid reflux - Will recommend increasing metformin at f/u if GI symptoms are improved - Recommend to check glucose at least twice daily with glucometer, or continously with CGM if reader is available. Offered to set up in-person appt to set up CGM on smart phone, if compatible, though patient declined at this time.  - Pt is currently treated with a moderate-intensity statin. Recommend repeat lipid panel at PCP f/u in Nov 2024.   Follow Up Plan: pharmacist telephone 10/8 - 2 weeks, PCP 09/30/23  Nils Pyle, PharmD PGY1 Pharmacy Resident

## 2023-08-18 NOTE — Anesthesia Preprocedure Evaluation (Addendum)
Anesthesia Evaluation  Patient identified by MRN, date of birth, ID band Patient awake    Reviewed: Allergy & Precautions, H&P , NPO status , Patient's Chart, lab work & pertinent test results  Airway Mallampati: III  TM Distance: <3 FB Neck ROM: Full    Dental no notable dental hx.    Pulmonary asthma , COPD   Pulmonary exam normal breath sounds clear to auscultation       Cardiovascular hypertension, + CAD  Normal cardiovascular exam Rhythm:Regular Rate:Normal  08-20-16 - Left ventricle: The cavity size was normal. Wall thickness was    increased in a pattern of mild LVH. Systolic function was normal.    The estimated ejection fraction was in the range of 60% to 65%.    Wall motion was normal; there were no regional wall motion    abnormalities. Left ventricular diastolic function parameters    were normal.  - Left atrium: The atrium was mildly dilated.  - Atrial septum: No defect or patent foramen ovale was identified  Mild tricuspid regurg   06-13-22 Echo stress test (06/19/2022 11:04 AM EDT) Imaging Results - Echo stress test (06/19/2022 11:04 AM EDT)  LV Ejection Fraction (%) 55          Aortic Valve Regurgitation Grade none          Aortic Valve Stenosis Grade none         Mitral Valve Regurgitation Grade trivial          Mitral Valve Stenosis Grade none          Tricuspid Valve Regurgitation Grade mild         Tricuspid Valve Regurgitation Max Velocity (m/s) 2.7 m/sec        Right Ventricle Systolic Pressure (mmHg) 31.6 mmHg         Neuro/Psych negative neurological ROS  negative psych ROS   GI/Hepatic Neg liver ROS,GERD  ,,  Endo/Other  diabetes    Renal/GU Renal disease  negative genitourinary   Musculoskeletal  (+) Arthritis ,    Abdominal   Peds negative pediatric ROS (+)  Hematology  (+) Blood dyscrasia, anemia   Anesthesia Other Findings ALLERGIC RHINITIS  BENIGN PROSTATIC  HYPERTROPHY Adenomatous colon polyp  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 DIABETIC RETINOPATHY, DYSPHAGIA UNSPECIFIED ESOPHAGEAL STRICTURE  GERD HYPERLIPIDEMIA OSTEOARTHRITIS, LUMBAR SPINE URINARY CALCULUS  Unspecified essential hypertension UNSPECIFIED ANEMIA  PAIN IN SOFT TISSUES OF LIMB Chronic back pain  Asthma Allergy Urine incontinence COPD (chronic obstructive pulmonary disease)   Diabetes mellitus without complication  Dysplastic nevus  Coronary artery disease History of kidney stones  Prostate cancer (HCC)    Reproductive/Obstetrics negative OB ROS                              Anesthesia Physical Anesthesia Plan  ASA: 3  Anesthesia Plan: MAC   Post-op Pain Management:    Induction: Intravenous  PONV Risk Score and Plan:   Airway Management Planned: Natural Airway and Nasal Cannula  Additional Equipment:   Intra-op Plan:   Post-operative Plan:   Informed Consent: I have reviewed the patients History and Physical, chart, labs and discussed the procedure including the risks, benefits and alternatives for the proposed anesthesia with the patient or authorized representative who has indicated his/her understanding and acceptance.     Dental Advisory Given  Plan Discussed with: Anesthesiologist, CRNA and Surgeon  Anesthesia Plan Comments: (Patient consented for risks  of anesthesia including but not limited to:  - adverse reactions to medications - damage to eyes, teeth, lips or other oral mucosa - nerve damage due to positioning  - sore throat or hoarseness - Damage to heart, brain, nerves, lungs, other parts of body or loss of life  Patient voiced understanding.)         Anesthesia Quick Evaluation

## 2023-08-19 NOTE — Discharge Instructions (Signed)

## 2023-08-22 NOTE — Assessment & Plan Note (Signed)
Chronic.  Tolerating statin therapy Continue Crestor 10 mg daily

## 2023-08-22 NOTE — Assessment & Plan Note (Addendum)
Chronic. Stable.  Goal <140/90 per JNC 8  Continue losartan to 50 mg BID, self increased dose Check Cmet Continue to monitor blood pressure at home, goal less than 140/90 Strict return precautions provided

## 2023-08-22 NOTE — Assessment & Plan Note (Signed)
Follows with ophthalmology Plan for upcoming surgery

## 2023-08-23 ENCOUNTER — Encounter: Payer: Self-pay | Admitting: Family Medicine

## 2023-08-23 NOTE — Assessment & Plan Note (Addendum)
Chronic.  Continue Tresiba 50 units daily Increase Rybelsus 7 mg daily.  Eye exam complete Foot exam complete Continue statin Continue ARB.  BP well controlled Follow up in 3 months

## 2023-08-23 NOTE — Assessment & Plan Note (Signed)
Refill Finasteride Refill Flomax

## 2023-08-25 ENCOUNTER — Encounter: Payer: Self-pay | Admitting: Ophthalmology

## 2023-08-25 ENCOUNTER — Encounter
Admission: RE | Disposition: A | Discharge status: Home / Self Care | Payer: Self-pay | Source: Home / Self Care | Attending: Ophthalmology

## 2023-08-25 ENCOUNTER — Ambulatory Visit: Payer: Medicare Other | Admitting: Anesthesiology

## 2023-08-25 ENCOUNTER — Ambulatory Visit
Admission: RE | Admit: 2023-08-25 | Discharge: 2023-08-25 | Disposition: A | Discharge status: Home / Self Care | Payer: Medicare Other | Attending: Ophthalmology | Admitting: Ophthalmology

## 2023-08-25 ENCOUNTER — Other Ambulatory Visit: Payer: Self-pay

## 2023-08-25 DIAGNOSIS — I10 Essential (primary) hypertension: Secondary | ICD-10-CM | POA: Insufficient documentation

## 2023-08-25 DIAGNOSIS — D649 Anemia, unspecified: Secondary | ICD-10-CM | POA: Insufficient documentation

## 2023-08-25 DIAGNOSIS — J449 Chronic obstructive pulmonary disease, unspecified: Secondary | ICD-10-CM | POA: Insufficient documentation

## 2023-08-25 DIAGNOSIS — M1990 Unspecified osteoarthritis, unspecified site: Secondary | ICD-10-CM | POA: Diagnosis not present

## 2023-08-25 DIAGNOSIS — N400 Enlarged prostate without lower urinary tract symptoms: Secondary | ICD-10-CM | POA: Diagnosis not present

## 2023-08-25 DIAGNOSIS — I082 Rheumatic disorders of both aortic and tricuspid valves: Secondary | ICD-10-CM | POA: Insufficient documentation

## 2023-08-25 DIAGNOSIS — E785 Hyperlipidemia, unspecified: Secondary | ICD-10-CM | POA: Diagnosis not present

## 2023-08-25 DIAGNOSIS — H2511 Age-related nuclear cataract, right eye: Secondary | ICD-10-CM | POA: Diagnosis not present

## 2023-08-25 DIAGNOSIS — I25119 Atherosclerotic heart disease of native coronary artery with unspecified angina pectoris: Secondary | ICD-10-CM | POA: Diagnosis not present

## 2023-08-25 DIAGNOSIS — E119 Type 2 diabetes mellitus without complications: Secondary | ICD-10-CM | POA: Diagnosis not present

## 2023-08-25 DIAGNOSIS — E1136 Type 2 diabetes mellitus with diabetic cataract: Secondary | ICD-10-CM | POA: Diagnosis present

## 2023-08-25 DIAGNOSIS — K219 Gastro-esophageal reflux disease without esophagitis: Secondary | ICD-10-CM | POA: Insufficient documentation

## 2023-08-25 DIAGNOSIS — G8929 Other chronic pain: Secondary | ICD-10-CM | POA: Diagnosis not present

## 2023-08-25 DIAGNOSIS — M549 Dorsalgia, unspecified: Secondary | ICD-10-CM | POA: Insufficient documentation

## 2023-08-25 HISTORY — DX: Rheumatic tricuspid insufficiency: I07.1

## 2023-08-25 HISTORY — PX: CATARACT EXTRACTION W/PHACO: SHX586

## 2023-08-25 LAB — GLUCOSE, CAPILLARY: Glucose-Capillary: 131 mg/dL — ABNORMAL HIGH (ref 70–99)

## 2023-08-25 SURGERY — PHACOEMULSIFICATION, CATARACT, WITH IOL INSERTION
Anesthesia: Monitor Anesthesia Care | Laterality: Right

## 2023-08-25 MED ORDER — TETRACAINE HCL 0.5 % OP SOLN
OPHTHALMIC | Status: AC
  Filled 2023-08-25: qty 4

## 2023-08-25 MED ORDER — SIGHTPATH DOSE#1 NA CHONDROIT SULF-NA HYALURON 40-17 MG/ML IO SOLN
INTRAOCULAR | Status: DC | PRN
  Administered 2023-08-25: 1 mL via INTRAOCULAR

## 2023-08-25 MED ORDER — SIGHTPATH DOSE#1 BSS IO SOLN
INTRAOCULAR | Status: DC | PRN
  Administered 2023-08-25: 15 mL via INTRAOCULAR

## 2023-08-25 MED ORDER — LACTATED RINGERS IV SOLN: INTRAVENOUS | Status: DC

## 2023-08-25 MED ORDER — SIGHTPATH DOSE#1 BSS IO SOLN
INTRAOCULAR | Status: DC | PRN
  Administered 2023-08-25: 75 mL via OPHTHALMIC

## 2023-08-25 MED ORDER — MIDAZOLAM HCL 2 MG/2ML IJ SOLN
INTRAMUSCULAR | Status: AC
  Filled 2023-08-25: qty 2

## 2023-08-25 MED ORDER — MIDAZOLAM HCL 2 MG/2ML IJ SOLN
INTRAMUSCULAR | Status: DC | PRN
  Administered 2023-08-25: 2 mg via INTRAVENOUS

## 2023-08-25 MED ORDER — ARMC OPHTHALMIC DILATING DROPS
OPHTHALMIC | Status: AC
  Filled 2023-08-25: qty 0.5

## 2023-08-25 MED ORDER — FENTANYL CITRATE (PF) 100 MCG/2ML IJ SOLN
INTRAMUSCULAR | Status: DC | PRN
  Administered 2023-08-25: 100 ug via INTRAVENOUS

## 2023-08-25 MED ORDER — MOXIFLOXACIN HCL 0.5 % OP SOLN
OPHTHALMIC | Status: DC | PRN
  Administered 2023-08-25: .2 mL via OPHTHALMIC

## 2023-08-25 MED ORDER — ARMC OPHTHALMIC DILATING DROPS
1.0000 | OPHTHALMIC | Status: DC | PRN
  Administered 2023-08-25 (×3): 1 via OPHTHALMIC

## 2023-08-25 MED ORDER — BRIMONIDINE TARTRATE-TIMOLOL 0.2-0.5 % OP SOLN
OPHTHALMIC | Status: DC | PRN
  Administered 2023-08-25: 1 [drp] via OPHTHALMIC

## 2023-08-25 MED ORDER — TETRACAINE HCL 0.5 % OP SOLN
1.0000 [drp] | OPHTHALMIC | Status: DC | PRN
  Administered 2023-08-25 (×3): 1 [drp] via OPHTHALMIC

## 2023-08-25 MED ORDER — SIGHTPATH DOSE#1 BSS IO SOLN
INTRAOCULAR | Status: DC | PRN
  Administered 2023-08-25: 1 mL

## 2023-08-25 MED ORDER — FENTANYL CITRATE (PF) 100 MCG/2ML IJ SOLN
INTRAMUSCULAR | Status: AC
  Filled 2023-08-25: qty 2

## 2023-08-25 SURGICAL SUPPLY — 11 items
ANGLE REVERSE CUT SHRT 25GA (CUTTER) ×1
CATARACT SUITE SIGHTPATH (MISCELLANEOUS) ×1
CYSTOTOME ANGL RVRS SHRT 25G (CUTTER) ×1 IMPLANT
FEE CATARACT SUITE SIGHTPATH (MISCELLANEOUS) ×1 IMPLANT
GLOVE BIOGEL PI IND STRL 8 (GLOVE) ×1 IMPLANT
GLOVE SURG LX STRL 8.0 MICRO (GLOVE) ×1 IMPLANT
LENS CLARION VIVITY TORIC 21.0 ×1 IMPLANT
LENS IOL CLRN VT TRC 3 21.0 IMPLANT
NDL FILTER BLUNT 18X1 1/2 (NEEDLE) ×1 IMPLANT
NEEDLE FILTER BLUNT 18X1 1/2 (NEEDLE) ×1
SYR 3ML LL SCALE MARK (SYRINGE) ×1 IMPLANT

## 2023-08-25 NOTE — Op Note (Signed)
PREOPERATIVE DIAGNOSIS:  Nuclear sclerotic cataract of the right eye.   POSTOPERATIVE DIAGNOSIS:  Nuclear sclerotic cataract of the right eye.   OPERATIVE PROCEDURE: Procedure(s): CATARACT EXTRACTION PHACO AND INTRAOCULAR LENS PLACEMENT (IOC) RIGHT CLAREON VIVITY TORIC DIABETIC 13.53 01:17.7   SURGEON:  Galen Manila, MD.   ANESTHESIA: 1.      Managed anesthesia care. 2.     0.49ml of Shugarcaine was instilled following the paracentesis  Anesthesiologist: Marisue Humble, MD CRNA: Domenic Moras, CRNA  COMPLICATIONS:  None.   TECHNIQUE:   Stop and chop    DESCRIPTION OF PROCEDURE:  The patient was examined and consented in the preoperative holding area where the aforementioned topical anesthesia was applied to the right eye.  The patient was brought back to the Operating Room where he was sat upright on the gurney and given a target to fixate upon while the eye was marked at the 3:00 and 9:00 position.  The patient was then reclined on the operating table.  The eye was prepped and draped in the usual sterile ophthalmic fashion and a lid speculum was placed. A paracentesis was created with the side port blade and the anterior chamber was filled with viscoelastic. A near clear corneal incision was performed with the steel keratome. A continuous curvilinear capsulorrhexis was performed with a cystotome followed by the capsulorrhexis forceps. Hydrodissection and hydrodelineation were carried out with BSS on a blunt cannula. The lens was removed in a stop and chop technique and the remaining cortical material was removed with the irrigation-aspiration handpiece. The eye was inflated with viscoelastic and the ZCT  lens  was placed in the eye and rotated to within a few degrees of the predetermined orientation.  The remaining viscoelastic was removed from the eye.  The Sinskey hook was used to rotate the toric lens into its final resting place at 007 degrees.  0. The eye was inflated to a  physiologic pressure and found to be watertight. 0.36ml of Vigamox was placed in the anterior chamber.  The eye was dressed with Vigamox.and Combigan. The patient was given protective glasses to wear throughout the day and a shield with which to sleep tonight. The patient was also given drops with which to begin a drop regimen today and will follow-up with me in one day. Implant Name Type Inv. Item Serial No. Manufacturer Lot No. LRB No. Used Action  LENS CLARION VIVITY TORIC 21.0 - [id number]  LENS CLARION VIVITY TORIC 21.0 [id number] [employer]  Right 1 Implanted   Procedure(s): CATARACT EXTRACTION PHACO AND INTRAOCULAR LENS PLACEMENT (IOC) RIGHT CLAREON VIVITY TORIC DIABETIC 13.53 01:17.7 (Right)  Electronically signed: Galen Manila 08/25/2023 9:10 AM

## 2023-08-25 NOTE — H&P (Signed)
[facility]   Primary Care Physician:  Dana Allan, MD Ophthalmologist: Dr. Maren Reamer  Pre-Procedure History & Physical: HPI:  [name] is a 77 y.o. male here for cataract surgery.   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   DYSPHAGIA UNSPECIFIED 09/29/2008   Dysplastic nevus 02/20/2021   L post thigh - mild    ESOPHAGEAL STRICTURE 12/28/2008   GERD 11/28/2008   History of kidney stones    HYPERLIPIDEMIA 08/03/2007   Mild tricuspid regurgitation by prior echocardiogram    OSTEOARTHRITIS, 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   PAIN IN SOFT TISSUES OF LIMB 01/07/2008   Prostate cancer (HCC)    UNSPECIFIED ANEMIA 12/29/2007   Unspecified essential hypertension 01/07/2008   URINARY CALCULUS 09/29/2008   Urine incontinence     Past Surgical History:  Procedure Laterality Date   CHOLECYSTECTOMY N/A 11/12/2016   Procedure: LAPAROSCOPIC CHOLECYSTECTOMY WITH INTRAOPERATIVE CHOLANGIOGRAM;  Surgeon: Ricarda Frame, MD;  Location: [facility] ORS;  Service: General;  Laterality: N/A;   COLONOSCOPY     polyp   dupyretens contracture     right hand surgery,left hand    HAND SURGERY Right    2016   LITHOTRIPSY     POLYPECTOMY     PROSTATE BIOPSY N/A 12/05/2021   Procedure: PROSTATE BIOPSY Addison Bailey;  Surgeon: Orson Ape, MD;  Location: [facility] ORS;  Service: Urology;  Laterality: N/A;   SHOULDER SURGERY Left    left, cyst and tumor removed   URINARY SURGERY  2018   Dr Diamantina Monks "stretched" urethra around prostate to increase urine flow    Prior to Admission medications    Medication Sig Start Date End Date Taking? Authorizing Provider  Ascorbic Acid (VITAMIN C) 1000 MG tablet Take 500 mg by mouth daily.   Yes [provider]  aspirin 81 MG tablet Take 81 mg by mouth daily.   Yes [provider]  calcium carbonate (OSCAL) 1500 (600 Ca) MG TABS tablet Take 600 mg of elemental calcium by mouth daily with breakfast.   Yes [provider]  Cholecalciferol (VITAMIN D-3) 125 MCG (5000 UT) TABS Take 5,000 Units by mouth daily.   Yes [provider]  Continuous Glucose Sensor (FREESTYLE LIBRE 2 SENSOR) MISC Use as directed to monitor blood glucose. Change sensor every 14 days. 08/18/23  Yes Dana Allan, MD  Cyanocobalamin (VITAMIN B-12) 1000 MCG SUBL Place 1 tablet (1,000 mcg total) under the tongue daily. 06/30/23  Yes Dana Allan, MD  dutasteride (AVODART) 0.5 MG capsule Take 1 capsule (0.5 mg total) by mouth daily. 08/14/23  Yes Dana Allan, MD  insulin degludec (TRESIBA FLEXTOUCH) 200 UNIT/ML FlexTouch Pen Inject 50 Units into the skin daily. 07/30/23  Yes Dana Allan, MD  Lancets [facility] ULTRASOFT) lancets Use as instructed 2x daily Dx: 250.00 01/15/18  Yes Romero Belling, MD  Lancets [facility] ULTRASOFT) lancets Bid Use as instructed 06/27/22  Yes McLean-Scocuzza, Pasty Spillers, MD  losartan (COZAAR) 50 MG tablet Take 1 tablet (50 mg total) by mouth in the morning and at bedtime. 08/14/23  Yes Dana Allan, MD  MAGNESIUM PO Take 1 tablet by mouth daily.   Yes [provider]  metFORMIN (GLUCOPHAGE-XR) 500 MG 24 hr tablet Take 1 tablet (500 mg total) by mouth 2 (two) times daily with a meal. 07/30/23  Yes Dana Allan, MD  Na Sulfate-K Sulfate-Mg Sulf 17.5-3.13-1.6 GM/177ML SOLN See admin instructions. 10/07/22  Yes [provider]  rosuvastatin (CRESTOR) 10 MG tablet Take 1 tablet (10 mg total) by mouth daily. 06/30/23  Yes Dana Allan, MD  Semaglutide (RYBELSUS) 7 MG TABS Take 1 tablet (7 mg total) by mouth daily. Patient not  taking: Reported on 08/18/2023 08/14/23  Yes Dana Allan, MD  tamsulosin (FLOMAX) 0.4 MG CAPS capsule Take 1 capsule (0.4 mg total) by mouth daily. 08/14/23  Yes Dana Allan, MD  vitamin E 400 UNIT capsule Take 400 Units by mouth daily.   Yes [provider]    Allergies as of 07/21/2023 - Review Complete 07/12/2023  Allergen Reaction Noted   Sulfamethoxazole-trimethoprim Rash     Family History  Problem Relation Age of Onset   Diabetes Mother    Depression Mother    Prostate cancer Brother    Diabetes Brother    Heart disease Brother    Diabetes Brother    Colon cancer Neg Hx    Esophageal cancer Neg Hx    Rectal cancer Neg Hx    Stomach cancer Neg Hx    Colon polyps Neg Hx    Crohn's disease Neg Hx     Social History   Socioeconomic History   Marital status: Married    Spouse name: Not on file   Number of children: 2   Years of education: Not on file   Highest education level: Not on file  Occupational History   Occupation: Metal Fabrication    Employer: [employer]  Tobacco Use   Smoking status: Never    Passive exposure: Past   Smokeless tobacco: Never  Vaping Use   Vaping status: Never Used  Substance and Sexual Activity   Alcohol use: Yes    Comment: occasional beer   Drug use: No   Sexual activity: Not on file  Other Topics Concern   Not on file  Social History Narrative   Daily Caffeine Use 1-2 daily   Married    Never smoker    Wears seat belt, safe in relationship    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, retired    International aid/development worker of Corporate investment banker Strain: Low Risk  (08/03/2023)   Overall Financial Resource Strain (CARDIA)    Difficulty of Paying Living Expenses: Not hard at all  Food Insecurity: No Food Insecurity (08/03/2023)   Hunger Vital Sign    Worried About Running Out of Food in the Last Year: Never true    Ran Out of Food in the Last Year: Never true  Transportation Needs: No Transportation Needs (08/03/2023)   PRAPARE - Therapist, art (Medical): No    Lack of Transportation (Non-Medical): No  Physical Activity: Inactive (08/03/2023)   Exercise Vital Sign    Days of Exercise per Week: 0 days    Minutes of Exercise per Session: 0 min  Stress: No Stress Concern Present (08/03/2023)   [employer] of Occupational Health - Occupational Stress Questionnaire    Feeling of Stress : Only a little  Social Connections: Moderately Integrated (08/03/2023)   Social Connection and Isolation Panel [NHANES]    Frequency of Communication with  Friends and Family: More than three times a week    Frequency of Social Gatherings with Friends and Family: Once a week    Attends Religious Services: More than 4 times per year    Active Member of [employer] or Organizations: No    Attends Banker Meetings: Never    Marital Status: Married  Catering manager Violence: Not At Risk (08/03/2023)   Humiliation, Afraid, Rape, and Kick questionnaire    Fear of Current or Ex-Partner: No    Emotionally Abused: No    Physically Abused: No    Sexually Abused: No    Review of Systems: See HPI, otherwise negative ROS  Physical Exam: BP (!) 149/74   Pulse 75   Temp 98.4 F (36.9 C) (Temporal)   Resp 16   Ht 6' (1.829 m)   Wt 86.1 kg   SpO2 99%   BMI 25.76 kg/m  General:   Alert, cooperative in NAD Head:  Normocephalic and atraumatic. Respiratory:  Normal work of breathing. Cardiovascular:  RRR  Impression/Plan: [name] is here for cataract surgery.  Risks, benefits, limitations, and alternatives regarding cataract surgery have been reviewed with the patient.  Questions have been answered.  All parties agreeable.   Galen Manila, MD  08/25/2023, 8:41 AM

## 2023-08-25 NOTE — Transfer of Care (Signed)
Immediate Anesthesia Transfer of Care Note  Patient: [name]  Procedure(s) Performed: CATARACT EXTRACTION PHACO AND INTRAOCULAR LENS PLACEMENT (IOC) RIGHT CLAREON VIVITY TORIC DIABETIC 13.53 01:17.7 (Right)  Patient Location: PACU  Anesthesia Type: MAC  Level of Consciousness: awake, alert  and patient cooperative  Airway and Oxygen Therapy: Patient Spontanous Breathing and Patient connected to supplemental oxygen  Post-op Assessment: Post-op Vital signs reviewed, Patient's Cardiovascular Status Stable, Respiratory Function Stable, Patent Airway and No signs of Nausea or vomiting  Post-op Vital Signs: Reviewed and stable  Complications: No notable events documented.

## 2023-08-25 NOTE — Anesthesia Postprocedure Evaluation (Signed)
Anesthesia Post Note  Patient: [name]  Procedure(s) Performed: CATARACT EXTRACTION PHACO AND INTRAOCULAR LENS PLACEMENT (IOC) RIGHT CLAREON VIVITY TORIC DIABETIC 13.53 01:17.7 (Right)  Patient location during evaluation: PACU Anesthesia Type: MAC Level of consciousness: awake and alert Pain management: pain level controlled Vital Signs Assessment: post-procedure vital signs reviewed and stable Respiratory status: spontaneous breathing, nonlabored ventilation, respiratory function stable and patient connected to nasal cannula oxygen Cardiovascular status: stable and blood pressure returned to baseline Postop Assessment: no apparent nausea or vomiting Anesthetic complications: no   No notable events documented.   Last Vitals:  Vitals:   08/25/23 0912 08/25/23 0915  BP: 128/77 122/65  Pulse: 77 67  Resp: 14 13  Temp: 36.6 C 36.6 C  SpO2: 97% 97%    Last Pain:  Vitals:   08/25/23 0915  TempSrc:   PainSc: 0-No pain                 [name]

## 2023-09-01 ENCOUNTER — Other Ambulatory Visit: Payer: Medicare Other

## 2023-09-01 DIAGNOSIS — E118 Type 2 diabetes mellitus with unspecified complications: Secondary | ICD-10-CM

## 2023-09-01 NOTE — Progress Notes (Signed)
09/01/2023 Name: [name] MRN: [id number] DOB: May 18, 1946  Chief Complaint  Patient presents with   Diabetes    [name] is a 77 y.o. year old male who presented for a telephone visit.   They were referred to the pharmacist by their PCP for assistance in managing diabetes.    Subjective:  Care Team: Primary Care Provider: Dana Allan, MD ; Next Scheduled Visit: 09/30/23  Medication Access/Adherence  Current Pharmacy:  [facility] [phone] [place], [place] - [address] [place] [address] Phone: [phone] Fax: [phone]  [facility] [phone] [place], [place] - [address] [place] [address] Phone: [phone] Fax: [phone]  [id number] - [place], [place] - [address] [place] [address] Phone: [phone] Fax: [phone]   Patient reports affordability concerns with their medications: Yes  - Rybelsus was $200 when he went to pharmacy last - did not pick it up. Reports the FL2 sensors did not cost him anything.  Patient reports access/transportation concerns to their pharmacy: No  Patient reports adherence concerns with their medications:  No     Diabetes:  Current medications: Rybelsus (semaglutide) 3 mg PO daily (planning to take until he runs out ~2 weeks), Tresiba 50 units in the AM and 20 units in the PM, metformin XR 500 mg PO BID  Medications tried in the past:   Reports that he is still having severe heartburn. Every time he lays down at night it is very painful. Would like to start some kind of medication to treat the heartburn.   Using One Touch Ultra meter; testing 2 times daily (FBG and evening) Current glucose readings:  FBG: 100, 71, 81, 82, 96, 111, 134, 109 PPG (evening): 200s, highest 267  Reports that he was not able to find FL2 reader - thinks it is in a storage unit in [facility]. Pt does have an iphone - states he has had it for ~2 years, so it  would likely be compatible.    Patient denies hypoglycemic s/sx including dizziness, shakiness, sweating. Patient denies hyperglycemic symptoms including polyuria, polydipsia, polyphagia, nocturia, neuropathy, blurred vision.  Reports BP is 145/88, 144/80, 146/91   Objective:  Lab Results  Component Value Date   HGBA1C 9.8 (H) 05/26/2023    Lab Results  Component Value Date   CREATININE 1.08 08/14/2023   BUN 20 08/14/2023   NA 137 08/14/2023   K 4.3 08/14/2023   CL 100 08/14/2023   CO2 29 08/14/2023    Lab Results  Component Value Date   CHOL 144 05/26/2023   HDL 51.00 05/26/2023   LDLCALC 72 05/26/2023   LDLDIRECT 46.0 08/31/2018   TRIG 103.0 05/26/2023   CHOLHDL 3 05/26/2023    Medications Reviewed Today     Reviewed by Particia Lather, RPH (Pharmacist) on 09/01/23 at 1841  Med List Status: <None>   Medication Order Taking? Sig Documenting Provider Last Dose Status Informant  Ascorbic Acid (VITAMIN C) 1000 MG tablet [id number]  Take 500 mg by mouth daily. [provider]  Active Self, Pharmacy Records  aspirin 81 MG tablet [id number]  Take 81 mg by mouth daily. [provider]  Active Self, Pharmacy Records  calcium carbonate (OSCAL) 1500 (600 Ca) MG TABS tablet [id number]  Take 600 mg of elemental calcium by mouth daily with breakfast. [provider]  Active Self, Pharmacy Records  Cholecalciferol (VITAMIN D-3) 125  MCG (5000 UT) TABS [id number]  Take 5,000 Units by mouth daily. [provider]  Active Self, Pharmacy Records  Continuous Glucose Sensor (FREESTYLE [place] 2 SENSOR) [place] [id number]  Use as directed to monitor blood glucose. Change sensor every 14 days. Dana Allan, MD  Active   Cyanocobalamin (VITAMIN B-12) 1000 MCG SUBL [id number]  Place 1 tablet (1,000 mcg total) under the tongue daily. Dana Allan, MD  Active   dutasteride (AVODART) 0.5 MG capsule [id number]  Take 1 capsule (0.5 mg total) by mouth daily. Dana Allan, MD   Active   insulin degludec (TRESIBA FLEXTOUCH) 200 UNIT/ML FlexTouch Pen [id number] Yes Inject 50 Units into the skin daily. Dana Allan, MD Taking Active            Med Note Particia Lather   Tue Aug 18, 2023  6:20 PM) Pt taking 50 units in the AM and 20 units in the evening.   Lancets [facility] ULTRASOFT) lancets [id number]  Use as instructed 2x daily Dx: 250.00 Romero Belling, MD  Active Self, Pharmacy Records  Lancets [facility] ULTRASOFT) lancets [id number]  Bid Use as instructed McLean-Scocuzza, Pasty Spillers, MD  Active   losartan (COZAAR) 50 MG tablet [id number] Yes Take 1 tablet (50 mg total) by mouth in the morning and at bedtime. Dana Allan, MD Taking Active   MAGNESIUM PO [id number]  Take 1 tablet by mouth daily. [provider]  Active Self, Pharmacy Records  metFORMIN (GLUCOPHAGE-XR) 500 MG 24 hr tablet [id number] Yes Take 1 tablet (500 mg total) by mouth 2 (two) times daily with a meal. Dana Allan, MD Taking Active   Na Sulfate-K Sulfate-Mg Sulf 17.5-3.13-1.6 GM/177ML SOLN [id number]  See admin instructions. [provider]  Active   rosuvastatin (CRESTOR) 10 MG tablet [id number] Yes Take 1 tablet (10 mg total) by mouth daily. Dana Allan, MD Taking Active   Semaglutide [facility]) 7 MG TABS [id number] No Take 1 tablet (7 mg total) by mouth daily.  Patient not taking: Reported on 09/01/2023   Dana Allan, MD Not Taking Active            Med Note Particia Lather   Tue Sep 01, 2023  6:41 PM) Still taking 3 mg tabs- not planning to refill due to cost (donut hole, ~$200)  tamsulosin (FLOMAX) 0.4 MG CAPS capsule [id number]  Take 1 capsule (0.4 mg total) by mouth daily. Dana Allan, MD  Active   vitamin E 400 UNIT capsule [id number]  Take 400 Units by mouth daily. [provider]  Active Self, Pharmacy Records             Assessment/Plan:   Diabetes: - Currently uncontrolled with last A1c of 9.8% above goal <7%, though improved per pt reported BG. Pt reports that he is  not going to refill Rybelsus (semaglutide) due to cost/donut whole - discussed option to pursue Ozempic or Rybelsus through patient assistance which pt will consider. Pt held Rybelsus for 1 week and did not notice any improvement in GERD symptoms. Pt does not appear to be having hypoglycemia on self-increased insulin dose of Tresiba. Pt is overbasalized (0.5 units/kg = 43 units daily), but insists on continuing to take evening dose of Tresiba. Likely needs in-person pharmacy appt to set up CGM and potentially apply for pt assistance for a GLP-1RA, which would help increase insulin sensitivity and decrease PPG. Pt is hesitant to increase metformin dose today. - Reviewed long term cardiovascular and renal outcomes of uncontrolled blood sugar - Reviewed goal  A1c, goal fasting, and goal 2 hour post prandial glucose - Recommend to continue insulin degludec Evaristo Bury) 50 units subcutaneous in the AM and 20 units subcutaneous in the PM - Recommend to continue Rybelsus 3 mg PO daily until supply runs out - will f/u eligibility for patient assistance  - Recommend to continue metformin XR 500 mg PO BID. Plan to reevaluate willingness to increase dose at follow-up - Recommend to check glucose twice daily - fasting and at bedtime. Will schedule in-person pharmacy appt to set up CGM monitoring on phone. Consider replacing FL2 with FL3, if patient is going to be using smart phone vs reader.   Medication Management: - Communicated with provider concerning severe GERD symptoms. Per fill hx, pt was taking omeprazole 40 mg daily until ~Feb 2024. Will request that provider refill medication prior to f/u on 09/30/23.  Follow Up Plan: PCP 09/30/23  Nils Pyle, PharmD PGY1 Pharmacy Resident

## 2023-09-03 ENCOUNTER — Encounter: Payer: Self-pay | Admitting: Pharmacist

## 2023-09-06 ENCOUNTER — Other Ambulatory Visit: Payer: Self-pay | Admitting: Family Medicine

## 2023-09-06 DIAGNOSIS — K2100 Gastro-esophageal reflux disease with esophagitis, without bleeding: Secondary | ICD-10-CM

## 2023-09-06 MED ORDER — OMEPRAZOLE 20 MG PO CPDR
20.0000 mg | DELAYED_RELEASE_CAPSULE | Freq: Every day | ORAL | 3 refills | Status: DC
Start: 2023-09-06 — End: 2023-10-13

## 2023-09-07 ENCOUNTER — Telehealth: Payer: Self-pay

## 2023-09-07 NOTE — Progress Notes (Unsigned)
Care Guide Note  09/07/2023 Name: [name] MRN: [id number] DOB: 1946/02/27  Referred by: Dana Allan, MD Reason for referral : Care Coordination (Outreach to schedule face to face with Pharm d )   [name] is a 77 y.o. year old male who is a primary care patient of Dana Allan, MD. [name] was referred to the pharmacist for assistance related to DM.    An unsuccessful telephone outreach was attempted today to contact the patient who was referred to the pharmacy team for assistance with medication management. Additional attempts will be made to contact the patient.   [name], RMA Care Guide [facility]  [place], [place] [address] Direct Dial: [phone] [name]

## 2023-09-09 NOTE — Progress Notes (Signed)
Care Guide Note  09/09/2023 Name: [name] MRN: [id number] DOB: May 24, 1946  Referred by: Dana Allan, MD Reason for referral : Care Coordination (Outreach to schedule face to face with Pharm d )   [name] is a 77 y.o. year old male who is a primary care patient of Dana Allan, MD. [name] was referred to the pharmacist for assistance related to DM.    Successful contact was made with the patient to discuss pharmacy services including being ready for the pharmacist to call at least 5 minutes before the scheduled appointment time, to have medication bottles and any blood sugar or blood pressure readings ready for review. The patient agreed to meet with the pharmacist via with the pharmacist via telephone visit on (date/time).  09/22/2023  [name], RMA Care Guide [facility]  [place], [place] [address] Direct Dial: [phone] [email or web address]

## 2023-09-10 NOTE — Discharge Instructions (Signed)

## 2023-09-11 ENCOUNTER — Encounter: Payer: Self-pay | Admitting: Pharmacist

## 2023-09-15 ENCOUNTER — Encounter
Admission: RE | Disposition: A | Discharge status: Home / Self Care | Payer: Self-pay | Source: Home / Self Care | Attending: Ophthalmology

## 2023-09-15 ENCOUNTER — Ambulatory Visit: Payer: Medicare Other | Admitting: Anesthesiology

## 2023-09-15 ENCOUNTER — Ambulatory Visit
Admission: RE | Admit: 2023-09-15 | Discharge: 2023-09-15 | Disposition: A | Discharge status: Home / Self Care | Payer: Medicare Other | Attending: Ophthalmology | Admitting: Ophthalmology

## 2023-09-15 ENCOUNTER — Other Ambulatory Visit: Payer: Self-pay

## 2023-09-15 ENCOUNTER — Encounter: Payer: Self-pay | Admitting: Ophthalmology

## 2023-09-15 DIAGNOSIS — I10 Essential (primary) hypertension: Secondary | ICD-10-CM | POA: Diagnosis not present

## 2023-09-15 DIAGNOSIS — H2512 Age-related nuclear cataract, left eye: Secondary | ICD-10-CM | POA: Insufficient documentation

## 2023-09-15 DIAGNOSIS — Z794 Long term (current) use of insulin: Secondary | ICD-10-CM | POA: Insufficient documentation

## 2023-09-15 DIAGNOSIS — E11319 Type 2 diabetes mellitus with unspecified diabetic retinopathy without macular edema: Secondary | ICD-10-CM | POA: Insufficient documentation

## 2023-09-15 DIAGNOSIS — Z8546 Personal history of malignant neoplasm of prostate: Secondary | ICD-10-CM | POA: Insufficient documentation

## 2023-09-15 DIAGNOSIS — J4489 Other specified chronic obstructive pulmonary disease: Secondary | ICD-10-CM | POA: Diagnosis not present

## 2023-09-15 DIAGNOSIS — E785 Hyperlipidemia, unspecified: Secondary | ICD-10-CM | POA: Insufficient documentation

## 2023-09-15 DIAGNOSIS — N400 Enlarged prostate without lower urinary tract symptoms: Secondary | ICD-10-CM | POA: Diagnosis not present

## 2023-09-15 DIAGNOSIS — I2510 Atherosclerotic heart disease of native coronary artery without angina pectoris: Secondary | ICD-10-CM | POA: Insufficient documentation

## 2023-09-15 DIAGNOSIS — Z7984 Long term (current) use of oral hypoglycemic drugs: Secondary | ICD-10-CM | POA: Insufficient documentation

## 2023-09-15 DIAGNOSIS — K219 Gastro-esophageal reflux disease without esophagitis: Secondary | ICD-10-CM | POA: Insufficient documentation

## 2023-09-15 DIAGNOSIS — E1136 Type 2 diabetes mellitus with diabetic cataract: Secondary | ICD-10-CM | POA: Insufficient documentation

## 2023-09-15 HISTORY — PX: CATARACT EXTRACTION W/PHACO: SHX586

## 2023-09-15 LAB — GLUCOSE, CAPILLARY: Glucose-Capillary: 133 mg/dL — ABNORMAL HIGH (ref 70–99)

## 2023-09-15 SURGERY — PHACOEMULSIFICATION, CATARACT, WITH IOL INSERTION
Anesthesia: Monitor Anesthesia Care | Site: Eye | Laterality: Left

## 2023-09-15 MED ORDER — TETRACAINE HCL 0.5 % OP SOLN
OPHTHALMIC | Status: AC
  Filled 2023-09-15: qty 4

## 2023-09-15 MED ORDER — SIGHTPATH DOSE#1 BSS IO SOLN
INTRAOCULAR | Status: DC | PRN
  Administered 2023-09-15: 2 mL

## 2023-09-15 MED ORDER — MOXIFLOXACIN HCL 0.5 % OP SOLN
OPHTHALMIC | Status: DC | PRN
  Administered 2023-09-15: .2 mL via OPHTHALMIC

## 2023-09-15 MED ORDER — ARMC OPHTHALMIC DILATING DROPS
1.0000 | OPHTHALMIC | Status: DC | PRN
  Administered 2023-09-15 (×3): 1 via OPHTHALMIC

## 2023-09-15 MED ORDER — SIGHTPATH DOSE#1 BSS IO SOLN
INTRAOCULAR | Status: DC | PRN
  Administered 2023-09-15: 15 mL via INTRAOCULAR

## 2023-09-15 MED ORDER — MIDAZOLAM HCL 2 MG/2ML IJ SOLN
INTRAMUSCULAR | Status: AC
Start: 2023-09-15 — End: ?
  Filled 2023-09-15: qty 2

## 2023-09-15 MED ORDER — SODIUM CHLORIDE 0.9% FLUSH
10.0000 mL | INTRAVENOUS | Status: DC | PRN
  Administered 2023-09-15: 10 mL via INTRAVENOUS

## 2023-09-15 MED ORDER — MIDAZOLAM HCL 2 MG/2ML IJ SOLN
INTRAMUSCULAR | Status: DC | PRN
  Administered 2023-09-15: 2 mg via INTRAVENOUS

## 2023-09-15 MED ORDER — SIGHTPATH DOSE#1 NA CHONDROIT SULF-NA HYALURON 40-17 MG/ML IO SOLN
INTRAOCULAR | Status: DC | PRN
  Administered 2023-09-15: 1 mL via INTRAOCULAR

## 2023-09-15 MED ORDER — TETRACAINE HCL 0.5 % OP SOLN
1.0000 [drp] | OPHTHALMIC | Status: DC | PRN
  Administered 2023-09-15 (×3): 1 [drp] via OPHTHALMIC

## 2023-09-15 MED ORDER — SIGHTPATH DOSE#1 BSS IO SOLN
INTRAOCULAR | Status: DC | PRN
  Administered 2023-09-15: 67 mL via OPHTHALMIC

## 2023-09-15 MED ORDER — BRIMONIDINE TARTRATE-TIMOLOL 0.2-0.5 % OP SOLN
OPHTHALMIC | Status: DC | PRN
  Administered 2023-09-15: 1 [drp] via OPHTHALMIC

## 2023-09-15 MED ORDER — ARMC OPHTHALMIC DILATING DROPS
OPHTHALMIC | Status: AC
  Filled 2023-09-15: qty 0.5

## 2023-09-15 SURGICAL SUPPLY — 15 items
ANGLE REVERSE CUT SHRT 25GA (CUTTER) ×1
CANNULA ANT/CHMB 27G (MISCELLANEOUS) IMPLANT
CANNULA ANT/CHMB 27GA (MISCELLANEOUS)
CATARACT SUITE SIGHTPATH (MISCELLANEOUS) ×1
CYSTOTOME ANGL RVRS SHRT 25G (CUTTER) ×1 IMPLANT
FEE CATARACT SUITE SIGHTPATH (MISCELLANEOUS) ×1 IMPLANT
GLOVE BIOGEL PI IND STRL 8 (GLOVE) ×1 IMPLANT
GLOVE SURG LX STRL 8.0 MICRO (GLOVE) ×1 IMPLANT
LENS CLAREON VIVITY TORIC 19.5 ×1 IMPLANT
LENS CLRN VIVITY TORIC 3 19.5 ×1 IMPLANT
LENS IOL CLRN VT TRC 3 19.5 IMPLANT
NDL FILTER BLUNT 18X1 1/2 (NEEDLE) ×1 IMPLANT
NEEDLE FILTER BLUNT 18X1 1/2 (NEEDLE) ×1
RING MALYGIN (MISCELLANEOUS) IMPLANT
SYR 3ML LL SCALE MARK (SYRINGE) ×1 IMPLANT

## 2023-09-15 NOTE — Transfer of Care (Signed)
Immediate Anesthesia Transfer of Care Note  Patient: [name]  Procedure(s) Performed: CATARACT EXTRACTION PHACO AND INTRAOCULAR LENS PLACEMENT (IOC) LEFT CLAREON VIVITY TORIC DIABETIC 14.37 01:14.9 (Left: Eye)  Patient Location: PACU  Anesthesia Type: MAC  Level of Consciousness: awake, alert  and patient cooperative  Airway and Oxygen Therapy: Patient Spontanous Breathing and Patient connected to supplemental oxygen  Post-op Assessment: Post-op Vital signs reviewed, Patient's Cardiovascular Status Stable, Respiratory Function Stable, Patent Airway and No signs of Nausea or vomiting  Post-op Vital Signs: Reviewed and stable  Complications: No notable events documented.

## 2023-09-15 NOTE — Anesthesia Preprocedure Evaluation (Signed)
Anesthesia Evaluation  Patient identified by MRN, date of birth, ID band Patient awake    Reviewed: Allergy & Precautions, H&P , NPO status , Patient's Chart, lab work & pertinent test results  Airway Mallampati: III  TM Distance: <3 FB Neck ROM: Full    Dental no notable dental hx.    Pulmonary asthma , COPD   Pulmonary exam normal breath sounds clear to auscultation       Cardiovascular hypertension, + CAD  Normal cardiovascular exam Rhythm:Regular Rate:Normal  08-20-16 - Left ventricle: The cavity size was normal. Wall thickness was    increased in a pattern of mild LVH. Systolic function was normal.    The estimated ejection fraction was in the range of 60% to 65%.    Wall motion was normal; there were no regional wall motion    abnormalities. Left ventricular diastolic function parameters    were normal.  - Left atrium: The atrium was mildly dilated.  - Atrial septum: No defect or patent foramen ovale was identified  Mild tricuspid regurg   06-13-22 Echo stress test (06/19/2022 11:04 AM EDT) Imaging Results - Echo stress test (06/19/2022 11:04 AM EDT)  LV Ejection Fraction (%) 55          Aortic Valve Regurgitation Grade none          Aortic Valve Stenosis Grade none         Mitral Valve Regurgitation Grade trivial          Mitral Valve Stenosis Grade none          Tricuspid Valve Regurgitation Grade mild         Tricuspid Valve Regurgitation Max Velocity (m/s) 2.7 m/sec        Right Ventricle Systolic Pressure (mmHg) 31.6 mmHg         Neuro/Psych negative neurological ROS  negative psych ROS   GI/Hepatic Neg liver ROS,GERD  ,,  Endo/Other  diabetes    Renal/GU Renal disease  negative genitourinary   Musculoskeletal  (+) Arthritis ,    Abdominal   Peds negative pediatric ROS (+)  Hematology  (+) Blood dyscrasia, anemia   Anesthesia Other Findings ALLERGIC RHINITIS  BENIGN PROSTATIC  HYPERTROPHY Adenomatous colon polyp  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 DIABETIC RETINOPATHY, DYSPHAGIA UNSPECIFIED ESOPHAGEAL STRICTURE  GERD HYPERLIPIDEMIA OSTEOARTHRITIS, LUMBAR SPINE URINARY CALCULUS  Unspecified essential hypertension UNSPECIFIED ANEMIA  PAIN IN SOFT TISSUES OF LIMB Chronic back pain  Asthma Allergy Urine incontinence COPD (chronic obstructive pulmonary disease)   Diabetes mellitus without complication  Dysplastic nevus  Coronary artery disease History of kidney stones  Prostate cancer (HCC)    Reproductive/Obstetrics negative OB ROS                              Anesthesia Physical Anesthesia Plan  ASA: 3  Anesthesia Plan: MAC   Post-op Pain Management:    Induction: Intravenous  PONV Risk Score and Plan:   Airway Management Planned: Natural Airway and Nasal Cannula  Additional Equipment:   Intra-op Plan:   Post-operative Plan:   Informed Consent: I have reviewed the patients History and Physical, chart, labs and discussed the procedure including the risks, benefits and alternatives for the proposed anesthesia with the patient or authorized representative who has indicated his/her understanding and acceptance.     Dental Advisory Given  Plan Discussed with: Anesthesiologist, CRNA and Surgeon  Anesthesia Plan Comments: (Patient consented for risks  of anesthesia including but not limited to:  - adverse reactions to medications - damage to eyes, teeth, lips or other oral mucosa - nerve damage due to positioning  - sore throat or hoarseness - Damage to heart, brain, nerves, lungs, other parts of body or loss of life  Patient voiced understanding.)         Anesthesia Quick Evaluation

## 2023-09-15 NOTE — H&P (Signed)
[facility]   Primary Care Physician:  Dana Allan, MD Ophthalmologist: Dr. Druscilla Brownie  Pre-Procedure History & Physical: HPI:  [name] is a 77 y.o. male here for cataract surgery.   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   DYSPHAGIA UNSPECIFIED 09/29/2008   Dysplastic nevus 02/20/2021   L post thigh - mild    ESOPHAGEAL STRICTURE 12/28/2008   GERD 11/28/2008   History of kidney stones    HYPERLIPIDEMIA 08/03/2007   Mild tricuspid regurgitation by prior echocardiogram    OSTEOARTHRITIS, 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   PAIN IN SOFT TISSUES OF LIMB 01/07/2008   Prostate cancer (HCC)    UNSPECIFIED ANEMIA 12/29/2007   Unspecified essential hypertension 01/07/2008   URINARY CALCULUS 09/29/2008   Urine incontinence     Past Surgical History:  Procedure Laterality Date   CATARACT EXTRACTION W/PHACO Right 08/25/2023   Procedure: CATARACT EXTRACTION PHACO AND INTRAOCULAR LENS PLACEMENT (IOC) RIGHT CLAREON VIVITY TORIC DIABETIC 13.53 01:17.7;  Surgeon: Galen Manila, MD;  Location[facility];  Service: Ophthalmology;  Laterality: Right;   CHOLECYSTECTOMY N/A 11/12/2016   Procedure: LAPAROSCOPIC CHOLECYSTECTOMY WITH INTRAOPERATIVE CHOLANGIOGRAM;  Surgeon: Ricarda Frame, MD;  Location: [facility];  Service: General;  Laterality: N/A;   COLONOSCOPY     polyp   dupyretens contracture     right hand surgery,left hand    HAND SURGERY Right    2016   LITHOTRIPSY     POLYPECTOMY     PROSTATE BIOPSY N/A 12/05/2021   Procedure: PROSTATE BIOPSY Addison Bailey;   Surgeon: Orson Ape, MD;  Location: [facility];  Service: Urology;  Laterality: N/A;   SHOULDER SURGERY Left    left, cyst and tumor removed   URINARY SURGERY  2018   Dr Diamantina Monks "stretched" urethra around prostate to increase urine flow    Prior to Admission medications   Medication Sig Start Date End Date Taking? Authorizing Provider  Ascorbic Acid (VITAMIN C) 1000 MG tablet Take 500 mg by mouth daily.   Yes [provider]  aspirin 81 MG tablet Take 81 mg by mouth daily.   Yes [provider]  calcium carbonate (OSCAL) 1500 (600 Ca) MG TABS tablet Take 600 mg of elemental calcium by mouth daily with breakfast.   Yes [provider]  Cholecalciferol (VITAMIN D-3) 125 MCG (5000 UT) TABS Take 5,000 Units by mouth daily.   Yes [provider]  Cyanocobalamin (VITAMIN B-12) 1000 MCG SUBL Place 1 tablet (1,000 mcg total) under the tongue daily. 06/30/23  Yes Dana Allan, MD  dutasteride (AVODART) 0.5 MG capsule Take 1 capsule (0.5 mg total) by mouth daily. 08/14/23  Yes Dana Allan, MD  insulin degludec (TRESIBA FLEXTOUCH) 200 UNIT/ML FlexTouch Pen Inject 50 Units into the skin daily. 07/30/23  Yes Dana Allan, MD  losartan (COZAAR) 50 MG tablet Take 1 tablet (50 mg total) by mouth in the morning and at bedtime. 08/14/23  Yes Dana Allan, MD  MAGNESIUM PO Take 1 tablet by mouth daily.   Yes [provider]  metFORMIN (GLUCOPHAGE-XR) 500 MG 24 hr tablet Take 1 tablet (500 mg total) by mouth 2 (two) times daily with a meal. 07/30/23  Yes Dana Allan, MD  omeprazole (PRILOSEC) 20 MG capsule Take 1 capsule (20 mg total) by mouth daily. 09/06/23  Yes Dana Allan, MD  rosuvastatin (CRESTOR) 10 MG tablet Take 1 tablet (10 mg total) by mouth daily. 06/30/23  Yes Dana Allan, MD  Semaglutide (RYBELSUS) 7 MG TABS Take 1 tablet (7 mg total) by mouth daily. 08/14/23  Yes Dana Allan, MD  tamsulosin (FLOMAX) 0.4 MG CAPS capsule Take 1 capsule (0.4 mg total) by  mouth daily. 08/14/23  Yes Dana Allan, MD  vitamin E 400 UNIT capsule Take 400 Units by mouth daily.   Yes [provider]  Continuous Glucose Sensor (FREESTYLE [place] 2 SENSOR) MISC Use as directed to monitor blood glucose. Change sensor every 14 days. 08/18/23   Dana Allan, MD  Lancets [facility] ULTRASOFT) lancets Use as instructed 2x daily Dx: 250.00 01/15/18   Romero Belling, MD  Lancets [facility] ULTRASOFT) lancets Bid Use as instructed 06/27/22   McLean-Scocuzza, Pasty Spillers, MD  Na Sulfate-K Sulfate-Mg Sulf 17.5-3.13-1.6 GM/177ML SOLN See admin instructions. 10/07/22   [provider]    Allergies as of 07/21/2023 - Review Complete 07/12/2023  Allergen Reaction Noted   Sulfamethoxazole-trimethoprim Rash     Family History  Problem Relation Age of Onset   Diabetes Mother    Depression Mother    Prostate cancer Brother    Diabetes Brother    Heart disease Brother    Diabetes Brother    Colon cancer Neg Hx    Esophageal cancer Neg Hx    Rectal cancer Neg Hx    Stomach cancer Neg Hx    Colon polyps Neg Hx    Crohn's disease Neg Hx     Social History   Socioeconomic History   Marital status: Married    Spouse name: Not on file   Number of children: 2   Years of education: Not on file   Highest education level: Not on file  Occupational History   Occupation: Metal Fabrication    Employer: [employer]  Tobacco Use   Smoking status: Never    Passive exposure: Past   Smokeless tobacco: Never  Vaping Use   Vaping status: Never Used  Substance and Sexual Activity   Alcohol use: Yes    Comment: occasional beer   Drug use: No   Sexual activity: Not on file  Other Topics Concern   Not on file  Social History Narrative   Daily Caffeine Use 1-2 daily   Married    Never smoker    Wears seat belt, safe in relationship    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, retired    International aid/development worker of Corporate investment banker Strain: Low Risk  (08/03/2023)   Overall Financial Resource Strain  (CARDIA)    Difficulty of Paying Living Expenses: Not hard at all  Food Insecurity: No Food Insecurity (08/03/2023)   Hunger Vital Sign    Worried About Running Out of Food in the Last Year: Never true    Ran Out of Food in the Last Year: Never true  Transportation Needs: No Transportation Needs (08/03/2023)   PRAPARE - Administrator, Civil Service (Medical): No    Lack of Transportation (Non-Medical): No  Physical Activity: Inactive (08/03/2023)   Exercise Vital Sign    Days of Exercise per Week: 0 days  Minutes of Exercise per Session: 0 min  Stress: No Stress Concern Present (08/03/2023)   [employer] of Occupational Health - Occupational Stress Questionnaire    Feeling of Stress : Only a little  Social Connections: Moderately Integrated (08/03/2023)   Social Connection and Isolation Panel [NHANES]    Frequency of Communication with Friends and Family: More than three times a week    Frequency of Social Gatherings with Friends and Family: Once a week    Attends Religious Services: More than 4 times per year    Active Member of [employer] or Organizations: No    Attends Banker Meetings: Never    Marital Status: Married  Catering manager Violence: Not At Risk (08/03/2023)   Humiliation, Afraid, Rape, and Kick questionnaire    Fear of Current or Ex-Partner: No    Emotionally Abused: No    Physically Abused: No    Sexually Abused: No    Review of Systems: See HPI, otherwise negative ROS  Physical Exam: BP 121/67   Pulse 73   Resp 15   Ht 6' (1.829 m)   Wt 86.6 kg   SpO2 98%   BMI 25.90 kg/m  General:   Alert, cooperative in NAD Head:  Normocephalic and atraumatic. Respiratory:  Normal work of breathing. Cardiovascular:  RRR  Impression/Plan: [name] is here for cataract surgery.  Risks, benefits, limitations, and alternatives regarding cataract surgery have been reviewed with the patient.  Questions have been answered.  All parties  agreeable.   Galen Manila, MD  09/15/2023, 10:23 AM

## 2023-09-15 NOTE — Op Note (Signed)
PREOPERATIVE DIAGNOSIS:  Nuclear sclerotic cataract of the left eye.   POSTOPERATIVE DIAGNOSIS:  Nuclear sclerotic cataract of the left eye.   OPERATIVE PROCEDURE: Procedure(s): CATARACT EXTRACTION PHACO AND INTRAOCULAR LENS PLACEMENT (IOC) LEFT CLAREON VIVITY TORIC DIABETIC 14.37 01:14.9   SURGEON:  Galen Manila, MD.   ANESTHESIA: 1.      Managed anesthesia care. 2.     0.14ml os Shugarcaine was instilled following the paracentesis [email or web address]@   COMPLICATIONS:  Viscoelastic was used to raise the pupil margin.  A  Malyugin ring was placed as the pupil would not achieve sufficient pharmacologic dilation to undergo cataract extraction safely.( The ring was removed atraumatically following insertion of the IOL.)    TECHNIQUE:   Stop and chop    DESCRIPTION OF PROCEDURE:  The patient was examined and consented in the preoperative holding area where the aforementioned topical anesthesia was applied to the left eye.  The patient was brought back to the Operating Room where he was sat upright on the gurney and given a target to fixate upon while the eye was marked at the 3:00 and 9:00 position.  The patient was then reclined on the operating table.  The eye was prepped and draped in the usual sterile ophthalmic fashion and a lid speculum was placed. A paracentesis was created with the side port blade and the anterior chamber was filled with viscoelastic. A near clear corneal incision was performed with the steel keratome. A continuous curvilinear capsulorrhexis was performed with a cystotome followed by the capsulorrhexis forceps. Hydrodissection and hydrodelineation were carried out with BSS on a blunt cannula. The lens was removed in a stop and chop technique and the remaining cortical material was removed with the irrigation-aspiration handpiece. The eye was inflated with viscoelastic and the ZCT lens was placed in the eye and rotated to within a few degrees of the predetermined orientation.  The  remaining viscoelastic was removed from the eye.  The Sinskey hook was used to rotate the toric lens into its final resting place at 174 degrees.  0.1 ml of Vigamox was placed in the anterior chamber. The eye was inflated to a physiologic pressure and found to be watertight.  The eye was dressed with Vigamox. The patient was given protective glasses to wear throughout the day and a shield with which to sleep tonight. The patient was also given drops with which to begin a drop regimen today and will follow-up with me in one day. Implant Name Type Inv. Item Serial No. Manufacturer Lot No. LRB No. Used Action  LENS CLAREON VIVITY TORIC 19.5 - [id number]  LENS CLAREON VIVITY TORIC 19.5 [id number] SIGHT[employer]  Left 1 Implanted   Procedure(s): CATARACT EXTRACTION PHACO AND INTRAOCULAR LENS PLACEMENT (IOC) LEFT CLAREON VIVITY TORIC DIABETIC 14.37 01:14.9 (Left)  Electronically signed: Galen Manila 10/22/202410:59 AM

## 2023-09-15 NOTE — Anesthesia Postprocedure Evaluation (Signed)
Anesthesia Post Note  Patient: [name]  Procedure(s) Performed: CATARACT EXTRACTION PHACO AND INTRAOCULAR LENS PLACEMENT (IOC) LEFT CLAREON VIVITY TORIC DIABETIC 14.37 01:14.9 (Left: Eye)  Patient location during evaluation: PACU Anesthesia Type: MAC Level of consciousness: awake and alert Pain management: pain level controlled Vital Signs Assessment: post-procedure vital signs reviewed and stable Respiratory status: spontaneous breathing, nonlabored ventilation, respiratory function stable and patient connected to nasal cannula oxygen Cardiovascular status: blood pressure returned to baseline and stable Postop Assessment: no apparent nausea or vomiting Anesthetic complications: no  No notable events documented.   Last Vitals:  Vitals:   09/15/23 1100 09/15/23 1105  BP: (!) 106/59 (!) 106/58  Pulse: 70 69  Resp: 13 13  Temp: 36.6 C 36.6 C  SpO2: 99% 99%    Last Pain:  Vitals:   09/15/23 1105  TempSrc:   PainSc: 0-No pain                 Stephanie Coup

## 2023-09-16 ENCOUNTER — Other Ambulatory Visit: Payer: Self-pay | Admitting: Pharmacist

## 2023-09-16 ENCOUNTER — Encounter: Payer: Self-pay | Admitting: Ophthalmology

## 2023-09-16 DIAGNOSIS — E119 Type 2 diabetes mellitus without complications: Secondary | ICD-10-CM

## 2023-09-16 NOTE — Progress Notes (Unsigned)
Patient has lost his [facility] reader. Current phone not compatible with [place] apps. Dexcom receiver sample available to provide to patient.   Will send Rx for Dexcom G7 sensors to pharmacy to see if we can get these covered w/o a prescription for a reader (Medicare requires reader for sensors to be covered). If approved, will reschedule patient appointment for in-person CGM set up/teaching .  Loree Fee, PharmD Clinical Pharmacist [facility] [phone]

## 2023-09-22 ENCOUNTER — Other Ambulatory Visit: Payer: Self-pay | Admitting: Pharmacist

## 2023-09-22 ENCOUNTER — Ambulatory Visit: Payer: Medicare Other | Admitting: Pharmacist

## 2023-09-22 VITALS — BP 149/74 | HR 76

## 2023-09-22 DIAGNOSIS — E785 Hyperlipidemia, unspecified: Secondary | ICD-10-CM

## 2023-09-22 DIAGNOSIS — E1159 Type 2 diabetes mellitus with other circulatory complications: Secondary | ICD-10-CM | POA: Diagnosis not present

## 2023-09-22 DIAGNOSIS — E1169 Type 2 diabetes mellitus with other specified complication: Secondary | ICD-10-CM

## 2023-09-22 DIAGNOSIS — I152 Hypertension secondary to endocrine disorders: Secondary | ICD-10-CM | POA: Diagnosis not present

## 2023-09-22 DIAGNOSIS — Z7984 Long term (current) use of oral hypoglycemic drugs: Secondary | ICD-10-CM

## 2023-09-22 DIAGNOSIS — E119 Type 2 diabetes mellitus without complications: Secondary | ICD-10-CM

## 2023-09-22 DIAGNOSIS — E118 Type 2 diabetes mellitus with unspecified complications: Secondary | ICD-10-CM

## 2023-09-22 DIAGNOSIS — Z794 Long term (current) use of insulin: Secondary | ICD-10-CM

## 2023-09-22 MED ORDER — DEXCOM G7 SENSOR MISC: 11 refills | Status: DC

## 2023-09-22 NOTE — Patient Instructions (Addendum)
[name],   It was a pleasure to speak with you today! As we discussed:?   Continue Ryblesus 3 mg once daily Take Rybelsus at least 30 minutes before the first food, beverage, or other oral medications of the day with no more than 4 ounces of plain water only. If taken differently, the medication will not work as well.   Take your injection, Tresiba (basaglar) 50 units once daily. Stop using your nighttime injection.   Continue Metformin XR 5001.5 tablet in the morning, 1 tablet in the evening with food  What If My Sensor Falls Off or What If My Sensor Isn't Working? Call [employer] Customer Care Team at [phone] Available 7 days a week from 8AM-8PM EST, excluding holidays  ___________________________________________________________________________ FreeStyle Libre 2 Device: Manual:  https://www.freestyle.abbott/content/dam/adc/freestyle/countries/[place]/documents/[email or web address]-guide.pdf?srsltid=[email or web address]   If you have not received the shingles vaccine previously, you are due to receive Shingrix.    Please reach out prior to your next scheduled appointment should you have any questions or concerns.   Thank you!   Future Appointments  Date Time Provider Department Center  09/22/2023 11:00 AM [facility] PHARMACIST [facility]  09/30/2023  1:00 PM Dana Allan, MD [facility]  08/03/2024  9:45 AM [facility] ANNUAL WELLNESS VISIT [facility]   Loree Fee, PharmD Clinical Pharmacist [facility] [phone]

## 2023-09-30 ENCOUNTER — Ambulatory Visit: Payer: Medicare Other | Admitting: Family Medicine

## 2023-10-05 ENCOUNTER — Other Ambulatory Visit: Payer: Medicare Other

## 2023-10-05 NOTE — Progress Notes (Deleted)
10/05/2023 Name: [name] MRN: [id number] DOB: Mar 14, 1946  Subjective  No chief complaint on file.  Reason for visit: [name] is a 77 y.o. year old male who was referred for medication management by their primary care provider, Dana Allan, MD. They presented for a face to face visit today.  They were referred to the pharmacist by their PCP for assistance in managing diabetes   Care Team: Primary Care Provider: Dana Allan, MD  Reason for visit: ?  [name] is a 77 y.o. male with a history of diabetes (type 2), who presents today for a follow up diabetes pharmacotherapy visit.? Pertinent PMH also includes HTN, CAD, Asthma/emphysema, GERD, chronic constipation, FLD, cholecystitis, HLD, CKD, hx kidney stones.   Known DM Complications: retinopathy, HTN, CAD, Diabetic Kidney Disease with microalbuminuria, hx retinopathy documented   Date of Last Diabetes Related Visit: with Pharmacist on 09/01/23   At Last Diabetes Related Visit: ?  Pt self-increased metformin to 750 mg AM, 500 mg PM. Rybelsus expensive though feasible. Provided patient with Omron arm BP cuff. Stop taking extra insulin dose in evenings (hypoglycemia). 10/8: Start Omeprazole 20 mg daily (heartburn at night); Libre 2 prescribed. Sensors covered though patient lost reader. Would like assistance setting up his device.   Medication Access/Adherence: Prescription drug coverage: Payor: [employer]vertising copywriter MEDICARE / Plan: UHC MEDICARE / Product Type: *No Product type* / .  Reports that all medications are not affordable. Rybelsus previously was covered though reports copay increased to > $200. Current Patient Assistance: None (reports living in a household of 2 and reports income >$60k as wife works) Medication Adherence: Patient denies missing doses of their medication.    Since Last visit / History of Present Illness: ?  Patient reports implementing plan from last visit. He is taking medications as  prescribed. ***Has stopped giving extra evening dose of Tresiba 20 units (in addition to 50 units each morning). With this, he reports no further instances of*** overnight hypoglycemia.  ***States that he does not go low if he makes sure nighttime BG is >200 mg/dL.   Since starting omeprazole, patient reports improvement in nighttime heartburn.   Reported DM Regimen: ?  Rybelsus 3 mg daily - Continues to take 3 mg due to large supply Metformin XR 750 mg qAM, 500 mg qPM.  Tresiba (basaglar) 50 units daily (morning) - Also taking additional 20 units at night    DM medications tried in the past:?  Ozempic (too much weight loss)  Overall, patient thinks that blood sugars are  lower in the morning and higher in the evening  since last diabetes related visit.   SMBG Per BG meter: ?Has been using glucometer twice daily. Has recently found [place] 2 reader.    FBG: 80-120 mg/dL ***    Evening: approaching 200 before dinner.  ***   Hypo/Hyperglycemia: ?  Symptoms of hypoglycemia since last visit:? yes - A couple of times, BG in the 50s mg/dL (reports one reading in the high 40s ~2 weeks ago)  If yes, it was treated by:  A couple of cookies   Symptoms of hyperglycemia since last visit:? no - none  Exercise: Has started walking a couple times per week. Reports goal of increasing days/week.   DM Prevention:  Statin: Taking; moderate intensity.?  History of chronic kidney disease? yes History of albuminuria? yes, last UACR on 05/26/23 = 40.9 mg/g (previous 2.1 mg/g 10/01/21) ACE/ARB - Taking losartan 50 mg daily; Urine MA/CR Ratio -  elevated urinary albumin excretion.  Last eye exam: 06/25/23 Last foot exam: 06/30/2023 Tobacco Use: None; Previous use of cigars  Immunizations:? Flu: Up to Date (last received 08/04/23); Pneumococcal: Up to Date PCV13 (01/23/17) PCV20 (06/27/22) PPSV23 (09/25/03, 03/27/15; received after age 34); Shingrix: No Record - DUE; Covid (Up to date, Booster 08/04/23)  Cardiovascular  Risk Reduction History of clinical ASCVD? no The 10-year ASCVD risk score (Arnett DK, et al., 2019) is: 52.7% History of heart failure? no  History of hyperlipidemia? yes Current BMI: 25.9 kg/m2 (Ht 72 in, Wt 86.6 kg) Taking statin? yes; moderate intensity (rosuvastatin 10 mg daily) Taking aspirin? unclear if indicated; Taking   Taking SGLT-2i? no Taking GLP- 1 RA?  On Rybelsus 3 mg (not therapeutic dose)   Reported HTN Regimen: ?  Losartan 50 mg twice daily  Patient takes their blood pressure medications in the morning and in the evening.  Patient is checking their blood pressure at home regularly (wrist cuff) Current blood pressure readings readings: From memory.  At last visit, pt provided w validated Omron BP cuff which he has been using to check BP.  Usually range ***140s/80s mmHg (morning). Highest 160/90 *** mmHg.  Patient denies hypotensive s/sx. No dizziness, lightheadedness.  Patient denies hypertensive symptoms. No headache, chest pain, shortness of breath, visual changes.     _______________________________________________  Objective    Review of Systems:?  Constitutional:? No fever, chills or unintentional weight loss  Cardiovascular:? No chest pain or pressure, shortness of breath, dyspnea on exertion, orthopnea or LE edema  Pulmonary:? No cough or shortness of breath  GI:? No nausea, vomiting, constipation, diarrhea, abdominal pain, dyspepsia, change in bowel habits  Endocrine:? No polyuria, polyphagia or blurred vision  Psych:? No depression, anxiety, insomnia    Physical Examination:  Vitals:  Wt Readings from Last 3 Encounters:  09/15/23 191 lb (86.6 kg)  08/25/23 189 lb 14.4 oz (86.1 kg)  08/14/23 190 lb 4 oz (86.3 kg)   BP Readings from Last 3 Encounters:  09/22/23 (!) 149/74  09/15/23 (!) 106/58  08/25/23 122/65   Pulse Readings from Last 3 Encounters:  09/22/23 76  09/15/23 69  08/25/23 67     Labs:?  Lab Results  Component Value Date    HGBA1C 9.8 (H) 05/26/2023   HGBA1C 7.8 (H) 10/07/2022   HGBA1C 7.3 (H) 06/24/2022   GLUCOSE 161 (H) 08/14/2023   MICRALBCREAT 40.9 (H) 05/26/2023   MICRALBCREAT 2.1 10/01/2021   MICRALBCREAT 19 05/29/2020   CREATININE 1.08 08/14/2023   CREATININE 0.96 05/26/2023   CREATININE 1.13 10/07/2022   GFR 67.25 08/14/2023   GFR 77.58 05/26/2023   GFR 64.08 10/07/2022    Lab Results  Component Value Date   CHOL 144 05/26/2023   LDLCALC 72 05/26/2023   LDLCALC 18 10/07/2022   LDLCALC 16 02/14/2022   LDLDIRECT 46.0 08/31/2018   LDLDIRECT 43.0 08/25/2017   HDL 51.00 05/26/2023   TRIG 103.0 05/26/2023   TRIG 109.0 10/07/2022   TRIG 92.0 02/14/2022   ALT 17 08/14/2023   ALT 17 05/26/2023   AST 19 08/14/2023   AST 15 05/26/2023      Chemistry      Component Value Date/Time   NA 137 08/14/2023 0954   K 4.3 08/14/2023 0954   CL 100 08/14/2023 0954   CO2 29 08/14/2023 0954   BUN 20 08/14/2023 0954   CREATININE 1.08 08/14/2023 0954      Component Value Date/Time   CALCIUM 10.4 08/14/2023 0954  ALKPHOS 56 08/14/2023 0954   AST 19 08/14/2023 0954   ALT 17 08/14/2023 0954   BILITOT 0.8 08/14/2023 0954     The 10-year ASCVD risk score (Arnett DK, et al., 2019) is: 52.7%  Assessment and Plan:   1. Diabetes, type 2: uncontrolled per last A1c of 9.8% (05/26/23), increased from previous 7.8% (10/08/23) with goal <7% without hypoglycemia. Suspect improvement in A1c per home FBG 80-100 with occasional hypoglycemia. Patient notes hyperglycemia before dinner >200 mg/dL, higher after dinner. Expect GLP1RA titration to benefit PP hyperglycemia. Cost is somewhat of a barrier at this time though insurance plan to reset in Jan. States cost is feasible though he is unsure if worth it.  Current Regimen: Tresiba 50 units AM (taking +20 units before bed, not as prescribed), Rybelsus 3 mg daily, metformin (self-increased to 750 mg AM, 500 mg PM).  STOP taking extra Tresiba dose in the evenings. Continue  50 units each morning as prescribed Continue metformin and Rybelsus without change today  Diet: Nighttime snacking at times resulting in PP hyperglycemia. Working toward mindful choices at evening meal (more lean proteins/vegetables, limit carb portions) Exercise: Increasing #walks/week   SMBG: Libre 2 sensor placed in clinic today.  Reviewed signs/symptoms/treatment of hypoglycemia. Recommended keeping small apple juice boxes for tx. This way, will not have to finish OJ bottle once opened.  Future Consideration: GLP1-RA: Too much weight loss on Ozempic, BMI 25.9. Titration of Rybelsus reasonable, though worry about his new-onset GERD after Starting 3 mg dose which may be related. Reasonable to try alternative agent with less weight loss (Trulicity, Victoza) SGLT2i: Previously discussed: Patient hesitant to start additional diabetes medication. Discussed kidney benefit again today. Not started due to cost, assess in 2025 once Medicare resets.  Alternative: Brenzavvy available for ~$50/month through [facility].   Metformin: Stable renal function. Reasonable to increase to goal dose given eGFR persistently >60. Patient prefers to take med changes slow. Consider at PCP f/u visit.  TZD: Avoiding due to possible weight gain/increase in fracture risk.    2. HTN: uncontrolled based on clinic BP of 162/79, 149/74 mmHg today, goal <130/80 mmHg. At goal at previous 2 clinic visits in October. Does monitor BP at home and brought his wrist cuff to clinic today which does read higher diastolic than clinic cuff. Denies lightheadedness, dizziness, SOB, CP, vision changes.  Current Regimen: losartan 50 mg twice daily Continue medications without changes.  Provided patient with Omron arm BP cuff today. Future Consideration: CCB: Reasonable, monitor for LEE. Consider amlodipine 5 mg daily. Thiazide: hx kidney stones and Na remains at lower end of normal.  MRA may be beneficial in the setting of CKD with  increased microalbumin on last labs   3. ASCVD (Primary Prevention): mild left CAS with stable angina. LDL  reasonably well controlled  on last lipid panel with LDL 72 mg/dL, TG 119 mh/dL (11/27/76). LDL goal <70 mg/dL (primary prevention, diabetes).  Key risk factors include: diabetes, hypertension, hyperlipidemia, and former smoker The 10-year ASCVD risk score (Arnett DK, et al., 2019) is: 52.7% indicated patient is at High risk.  Current Regimen: rosuvastatin 10 mg daily, aspirin 81 mg daily  Continue medications today without changes  4. CKD: eGFR baseline has improved >60 relative to previous baseline ~40s-50s through 2017-2021. UACR well controlled previously, though microalbuminuria on recent labs >30 mg/g despite consistent ARB use. Would benefit from SGLT2i in the setting of DM2 and microalbuminuria though brand name may be cost prohibitive based on Rybelsus  copay. We reviewed his renal labs today.   Continue losartan 50 mg twice daily Future consideration:  SGLT2i ideal per albuminuria despite RAAS agent and concurrent DM2. MRA reasonable for kidney protection, and more cost-effective. K WNL though also on RAAS agent, would require close monitoring of K.   5. Healthcare Maintenance:  Pneumococcal - Current status: Up to Date, PCV-20. Series complete.  Shingles - Current status: Not documented Influenza - Current status: Completed for 2024 season  Due to receive the following vaccines: Shingrix   Follow Up Follow up with PCP scheduled 09/30/23 Check in with clinical pharmacist via phone in ~2 weeks once CGM data is available. Patient given direct line for questions regarding medication therapy  Future Appointments  Date Time Provider Department Center  10/05/2023 11:00 AM [facility]CM PHARMACIST [facility]  08/03/2024  9:45 AM [facility] ANNUAL WELLNESS VISIT [facility]    Loree Fee, PharmD Clinical Pharmacist [facility] [phone]

## 2023-10-13 ENCOUNTER — Ambulatory Visit: Payer: Medicare Other | Admitting: Family Medicine

## 2023-10-13 ENCOUNTER — Other Ambulatory Visit: Payer: Self-pay | Admitting: Family Medicine

## 2023-10-13 ENCOUNTER — Encounter: Payer: Self-pay | Admitting: Family Medicine

## 2023-10-13 VITALS — BP 130/62 | HR 90 | Temp 98.0°F | Resp 16 | Ht 72.0 in | Wt 192.2 lb

## 2023-10-13 DIAGNOSIS — E118 Type 2 diabetes mellitus with unspecified complications: Secondary | ICD-10-CM

## 2023-10-13 DIAGNOSIS — I152 Hypertension secondary to endocrine disorders: Secondary | ICD-10-CM | POA: Diagnosis not present

## 2023-10-13 DIAGNOSIS — K2100 Gastro-esophageal reflux disease with esophagitis, without bleeding: Secondary | ICD-10-CM | POA: Diagnosis not present

## 2023-10-13 DIAGNOSIS — E119 Type 2 diabetes mellitus without complications: Secondary | ICD-10-CM

## 2023-10-13 DIAGNOSIS — J439 Emphysema, unspecified: Secondary | ICD-10-CM

## 2023-10-13 DIAGNOSIS — N400 Enlarged prostate without lower urinary tract symptoms: Secondary | ICD-10-CM

## 2023-10-13 DIAGNOSIS — E1159 Type 2 diabetes mellitus with other circulatory complications: Secondary | ICD-10-CM

## 2023-10-13 DIAGNOSIS — E1169 Type 2 diabetes mellitus with other specified complication: Secondary | ICD-10-CM | POA: Diagnosis not present

## 2023-10-13 DIAGNOSIS — E538 Deficiency of other specified B group vitamins: Secondary | ICD-10-CM

## 2023-10-13 DIAGNOSIS — E785 Hyperlipidemia, unspecified: Secondary | ICD-10-CM

## 2023-10-13 DIAGNOSIS — N429 Disorder of prostate, unspecified: Secondary | ICD-10-CM

## 2023-10-13 DIAGNOSIS — Z7985 Long-term (current) use of injectable non-insulin antidiabetic drugs: Secondary | ICD-10-CM

## 2023-10-13 DIAGNOSIS — Z7984 Long term (current) use of oral hypoglycemic drugs: Secondary | ICD-10-CM

## 2023-10-13 LAB — POCT GLYCOSYLATED HEMOGLOBIN (HGB A1C): Hemoglobin A1C: 8.4 % — AB (ref 4.0–5.6)

## 2023-10-13 MED ORDER — METFORMIN HCL ER 500 MG PO TB24: 1000.0000 mg | ORAL_TABLET | Freq: Two times a day (BID) | ORAL | 1 refills | Status: DC

## 2023-10-13 MED ORDER — FREESTYLE LIBRE 2 SENSOR MISC: 5 refills | Status: DC

## 2023-10-13 MED ORDER — OMEPRAZOLE 20 MG PO CPDR: 20.0000 mg | DELAYED_RELEASE_CAPSULE | Freq: Every day | ORAL | 3 refills | Status: DC

## 2023-10-13 MED ORDER — ONETOUCH ULTRASOFT LANCETS MISC: 12 refills | Status: DC

## 2023-10-13 NOTE — Progress Notes (Signed)
SUBJECTIVE:   Chief Complaint  Patient presents with   Medical Management of Chronic Issues   HPI Presents to clinic for follow up chronic disease management  Discussed the use of AI scribe software for clinical note transcription with the patient, who gave verbal consent to proceed.  History of Present Illness [name], a patient with a history of diabetes, hypertension, and COPD, presents for a follow-up visit. The patient reports that his blood pressure has been well-controlled, but his diabetes management has been challenging. He has been using the [employer] 2 for glucose monitoring, which he misplaced but has since found and is currently using. He reports his blood glucose levels have been high, with readings over 300, despite taking 50 units of Tresiba in the morning and Metformin 1.5 tablets in the morning and one in the evening. He has self-adjusted his Metformin dosage, believing he needs to increase it to two tablets in the morning and one in the evening.  The patient also reports taking Rybelsus 7mg , but plans to discontinue due to the high cost. He has been experiencing some discomfort in the right side of his back, which he is concerned may be related to his kidneys. However, he denies any issues with urination or any noticeable blood in his urine. He is also on Flomax and Avodart for a history of borderline prostate cancer, previously followed by Dr Artis Flock.  Last office visit 06/2022.  The patient has identified a potential dietary contributor to his elevated blood glucose levels, specifically the creamer he uses in his coffee, and plans to eliminate it from his diet. He has had cataract surgery in the past, which went well, but anticipates needing reading glasses in the future. He also reports a history of being on the borderline of having prostate cancer, and is currently seeking a new urologist for follow-up.    PERTINENT PMH / PSH: As above  OBJECTIVE:  BP 130/62    Pulse 90   Temp 98 F (36.7 C)   Resp 16   Ht 6' (1.829 m)   Wt 192 lb 4 oz (87.2 kg)   SpO2 99%   BMI 26.07 kg/m    Physical Exam Vitals reviewed.  Constitutional:      General: He is not in acute distress.    Appearance: Normal appearance. He is not ill-appearing, toxic-appearing or diaphoretic.  Eyes:     General:        Right eye: No discharge.        Left eye: No discharge.  Cardiovascular:     Rate and Rhythm: Normal rate and regular rhythm.     Heart sounds: Normal heart sounds.  Pulmonary:     Effort: Pulmonary effort is normal.     Breath sounds: Normal breath sounds.  Abdominal:     General: Bowel sounds are normal.  Musculoskeletal:        General: Normal range of motion.     Cervical back: Normal range of motion.  Skin:    General: Skin is warm and dry.  Neurological:     Mental Status: He is alert and oriented to person, place, and time. Mental status is at baseline.  Psychiatric:        Mood and Affect: Mood normal.        Behavior: Behavior normal.        Thought Content: Thought content normal.        Judgment: Judgment normal.  10/13/2023    7:57 AM 08/14/2023    8:59 AM 08/03/2023   10:01 AM 06/30/2023    3:49 PM 05/26/2023   10:22 AM  Depression screen PHQ 2/9  Decreased Interest 0 0 0 0 2  Down, Depressed, Hopeless 0 0 0 0 0  PHQ - 2 Score 0 0 0 0 2  Altered sleeping 0 2 2 2 2   Tired, decreased energy 0 0 1 2 1   Change in appetite 0 0 0 0 0  Feeling bad or failure about yourself  0 0 0 0 0  Trouble concentrating 0 0 0 0 0  Moving slowly or fidgety/restless 0 0 0 0 0  Suicidal thoughts 0 0 0 0 0  PHQ-9 Score 0 2 3 4 5   Difficult doing work/chores Not difficult at all Not difficult at all Not difficult at all Not difficult at all Somewhat difficult      10/13/2023    7:57 AM 08/14/2023    8:59 AM 06/30/2023    3:50 PM 05/26/2023   10:22 AM  GAD 7 : Generalized Anxiety Score  Nervous, Anxious, on Edge 0 0 0 0  Control/stop worrying 0 0  0 0  Worry too much - different things 0 0 0 0  Trouble relaxing 0 0 0 0  Restless 0 0 0 0  Easily annoyed or irritable 0 0 0 0  Afraid - awful might happen 0 0 0 0  Total GAD 7 Score 0 0 0 0  Anxiety Difficulty Not difficult at all Not difficult at all Not difficult at all Not difficult at all    ASSESSMENT/PLAN:  Hypertension associated with diabetes [facility] Assessment & Plan: Well controlled. Does not check BP at home -Continue Losartan 50 mg BID -Encouraged to check BP at home and obtain home BP monitor   Orders: -     Comprehensive metabolic panel; Future  Type 2 diabetes mellitus with complications (HCC) Assessment & Plan: Poor control with high blood glucose readings (up to 300). Patient self-adjusting Metformin dose. Using [facility] 2 for glucose monitoring. -Increase Metformin XR 500 mg to two tablets in the morning and two tablets in the evening. -Continue Tresiba 50 units daily -HbA1c today 8.4, improved from previous -Encourage patient to maintain consistent medication regimen and monitor blood glucose levels regularly. -Encouraged to continue Rybelsus 3 mg daily and will discuss with pharmacy if can help financially.  Can provide samples if needed.   Orders: -     OneTouch UltraSoft Lancets; Bid Use as instructed  Dispense: 200 each; Refill: 12 -     FreeStyle Libre 2 Sensor; Use as directed to monitor blood glucose. Change sensor every 14 days.  Dispense: 2 each; Refill: 5 -     POCT glycosylated hemoglobin (Hb A1C) -     Rybelsus; Take 1 tablet (3 mg total) by mouth daily.  Dispense: 30 tablet; Refill: 3 -     POCT glycosylated hemoglobin (Hb A1C); Future -     Vitamin B12; Future -     metFORMIN HCl ER; Take 2 tablets (1,000 mg total) by mouth 2 (two) times daily with a meal.  Hyperlipidemia associated with type 2 diabetes mellitus (HCC) Assessment & Plan: Chronic.  Tolerating statin therapy Continue Crestor 10 mg daily    Gastroesophageal reflux  disease with esophagitis without hemorrhage Assessment & Plan: Refill Omeprazole  Orders: -     Omeprazole; Take 1 capsule (20 mg total) by mouth daily.  Dispense:  30 capsule; Refill: 3  Benign prostatic hyperplasia, unspecified whether lower urinary tract symptoms present Assessment & Plan: On Flomax. 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  Last PSA documented 4.26 10/01/2021.  Last f/u with urology 06/2022 -Continue Flomax 0.4 mg daily -Continue Avodart 0.5 mg daily -Repeat PSA at next visit. If continues to remain elevated will refer to Urology   Orders: -     PSA; Future  B12 deficiency -     Vitamin B12; Future  Pulmonary emphysema, unspecified emphysema type (HCC) Assessment & Plan: Asymptomatic.  Does not use inhalers. -Continue current management.   Prostate disorder Assessment & Plan: Requires repeat PSA Recommend urology referral.  Patient reports he is looking for Urologist.  He will notify me if needing referral.  If not evaluated by next visit will revisit with patient       PDMP reviewed  Return in about 3 months (around 01/13/2024) for PCP, Diabetes.  Dana Allan, MD

## 2023-10-13 NOTE — Patient Instructions (Addendum)
It was a pleasure meeting you today. Thank you for allowing me to take part in your health care.  Our goals for today as we discussed include:  Increase Metformin to 500 mg 2 tablets two times a day Stop the Rybelsus.  Will try to help to see if can continue the 3 mg daily financially.   Continue Tresiba 50 units daily  You should pay attention to your hemoglobin A1C.  It is a three month test about your average blood sugar. If the A1C is - <7.0 is great.  That is our goal for treating you. - Between 7.0 and 9.0 is not so good.  We would need to work to do better. - Above 9.0 is terrible.  You would really need to work with [place] to get it under control.    Today's A1C = 8.4.  Last A1c 9.8   If blood glucose greater than 400 and symptomatic please have someone take you to the Emergency department or call 911  Continue current blood pressure medication  This is a list of the screening recommended for you and due dates:  Health Maintenance  Topic Date Due   COVID-19 Vaccine (4 - 2023-24 season) 09/29/2023   Hemoglobin A1C  11/26/2023   Yearly kidney health urinalysis for diabetes  05/25/2024   Eye exam for diabetics  06/24/2024   Complete foot exam   06/29/2024   Medicare Annual Wellness Visit  08/02/2024   Yearly kidney function blood test for diabetes  08/13/2024   DTaP/Tdap/Td vaccine (3 - Td or Tdap) 08/31/2028   Pneumonia Vaccine  Completed   Flu Shot  Completed   Hepatitis C Screening  Completed   Zoster (Shingles) Vaccine  Completed   HPV Vaccine  Aged Out   Colon Cancer Screening  Discontinued    If you have any questions or concerns, please do not hesitate to call the office at [phone].  I look forward to our next visit and until then take care and stay safe.  Regards,   Dana Allan, MD   [facility]

## 2023-10-22 ENCOUNTER — Encounter: Payer: Self-pay | Admitting: Family Medicine

## 2023-10-22 ENCOUNTER — Telehealth: Payer: Self-pay | Admitting: Family Medicine

## 2023-10-22 MED ORDER — RYBELSUS 3 MG PO TABS: 3.0000 mg | ORAL_TABLET | Freq: Every day | ORAL | 3 refills | Status: DC

## 2023-10-22 MED ORDER — METFORMIN HCL ER 500 MG PO TB24: 1000.0000 mg | ORAL_TABLET | Freq: Two times a day (BID) | ORAL | Status: DC

## 2023-10-22 NOTE — Assessment & Plan Note (Signed)
Chronic.  Tolerating statin therapy Continue Crestor 10 mg daily

## 2023-10-22 NOTE — Assessment & Plan Note (Addendum)
Poor control with high blood glucose readings (up to 300). Patient self-adjusting Metformin dose. Using [facility] 2 for glucose monitoring. -Increase Metformin XR 500 mg to two tablets in the morning and two tablets in the evening. -Continue Tresiba 50 units daily -HbA1c today 8.4, improved from previous -Encourage patient to maintain consistent medication regimen and monitor blood glucose levels regularly. -Encouraged to continue Rybelsus 3 mg daily and will discuss with pharmacy if can help financially.  Can provide samples if needed.

## 2023-10-22 NOTE — Assessment & Plan Note (Signed)
Refill Omeprazole

## 2023-10-22 NOTE — Telephone Encounter (Signed)
er

## 2023-10-22 NOTE — Assessment & Plan Note (Addendum)
On Flomax. 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  Last PSA documented 4.26 10/01/2021.  Last f/u with urology 06/2022 -Continue Flomax 0.4 mg daily -Continue Avodart 0.5 mg daily -Repeat PSA at next visit. If continues to remain elevated will refer to Urology

## 2023-10-22 NOTE — Assessment & Plan Note (Signed)
Asymptomatic.  Does not use inhalers. -Continue current management.

## 2023-10-22 NOTE — Assessment & Plan Note (Signed)
Requires repeat PSA Recommend urology referral.  Patient reports he is looking for Urologist.  He will notify me if needing referral.  If not evaluated by next visit will revisit with patient

## 2023-10-22 NOTE — Assessment & Plan Note (Signed)
Well controlled. Does not check BP at home -Continue Losartan 50 mg BID -Encouraged to check BP at home and obtain home BP monitor

## 2023-10-29 ENCOUNTER — Telehealth: Payer: Self-pay

## 2023-10-29 DIAGNOSIS — E118 Type 2 diabetes mellitus with unspecified complications: Secondary | ICD-10-CM

## 2023-10-29 MED ORDER — RYBELSUS 3 MG PO TABS: 3.0000 mg | ORAL_TABLET | Freq: Every day | ORAL | Status: DC

## 2023-10-29 NOTE — Telephone Encounter (Signed)
Called pt to let him know that his sample of Rybelsus  is at the front ready for pick up.

## 2023-10-29 NOTE — Telephone Encounter (Signed)
Medication Samples have been provided to the patient.  Drug name: Rybelsus       Strength: 3 MG        Qty: 1 Box  LOT: [id number]  Exp.Date: 08/23/2024  Dosing instructions: Take 1 tablet (3 mg total) by mouth daily   The patient has been instructed regarding the correct time, dose, and frequency of taking this medication, including desired effects and most common side effects.   Teshia Mahone 1:18 PM 10/29/2023

## 2023-12-17 ENCOUNTER — Ambulatory Visit: Payer: Medicare Other | Admitting: Nurse Practitioner

## 2023-12-17 ENCOUNTER — Encounter: Payer: Self-pay | Admitting: Nurse Practitioner

## 2023-12-17 ENCOUNTER — Ambulatory Visit (INDEPENDENT_AMBULATORY_CARE_PROVIDER_SITE_OTHER): Payer: Medicare Other | Admitting: Nurse Practitioner

## 2023-12-17 VITALS — BP 112/66 | HR 117 | Temp 98.2°F | Ht 72.0 in | Wt 191.8 lb

## 2023-12-17 DIAGNOSIS — R509 Fever, unspecified: Secondary | ICD-10-CM

## 2023-12-17 DIAGNOSIS — J101 Influenza due to other identified influenza virus with other respiratory manifestations: Secondary | ICD-10-CM

## 2023-12-17 LAB — POCT INFLUENZA A/B
Influenza A, POC: POSITIVE — AB
Influenza B, POC: NEGATIVE

## 2023-12-17 LAB — POC COVID19 BINAXNOW: SARS Coronavirus 2 Ag: NEGATIVE

## 2023-12-17 MED ORDER — OSELTAMIVIR PHOSPHATE 75 MG PO CAPS: 75.0000 mg | ORAL_CAPSULE | Freq: Two times a day (BID) | ORAL | 0 refills | Status: AC

## 2023-12-17 NOTE — Progress Notes (Signed)
Established Patient Office Visit  Subjective:  Patient ID: [name], male    DOB: 09-10-1946  Age: 78 y.o. MRN: [id number]  CC:  Chief Complaint  Patient presents with   Cough    Cough, fever & congestion    HPI  [name] presents for:  URI  This is a new problem. Episode onset: 3-4 days ago. The problem has been unchanged. The maximum temperature recorded prior to his arrival was 100.4 - 100.9 F. The fever has been present for 1 to 2 days. Pertinent negatives include no joint swelling. Associated symptoms comments: Myalgias, post nasal drip. He has tried decongestant for the symptoms. The treatment provided mild relief.     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   DYSPHAGIA UNSPECIFIED 09/29/2008   Dysplastic nevus 02/20/2021   L post thigh - mild    ESOPHAGEAL STRICTURE 12/28/2008   GERD 11/28/2008   History of kidney stones    HYPERLIPIDEMIA 08/03/2007   Mild tricuspid regurgitation by prior echocardiogram    OSTEOARTHRITIS, 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   PAIN IN SOFT TISSUES OF LIMB 01/07/2008   Prostate cancer (HCC)    UNSPECIFIED ANEMIA 12/29/2007   Unspecified essential hypertension 01/07/2008   URINARY CALCULUS 09/29/2008   Urine incontinence     Past Surgical History:  Procedure Laterality Date   CATARACT EXTRACTION W/PHACO Right 08/25/2023   Procedure: CATARACT EXTRACTION PHACO AND INTRAOCULAR LENS PLACEMENT (IOC) RIGHT [facility] 13.53 01:17.7;  Surgeon: Galen Manila, MD;  Location: [facility];  Service:  Ophthalmology;  Laterality: Right;   CATARACT EXTRACTION W/PHACO Left 09/15/2023   Procedure: CATARACT EXTRACTION PHACO AND INTRAOCULAR LENS PLACEMENT (IOC) LEFT [facility]ORIC DIABETIC 14.37 01:14.9;  Surgeon: Galen Manila, MD;  Location: [facility];  Service: Ophthalmology;  Laterality: Left;   CHOLECYSTECTOMY N/A 11/12/2016   Procedure: LAPAROSCOPIC CHOLECYSTECTOMY WITH INTRAOPERATIVE CHOLANGIOGRAM;  Surgeon: Ricarda Frame, MD;  Location: [facility];  Service: General;  Laterality: N/A;   COLONOSCOPY     polyp   dupyretens contracture     right hand surgery,left hand    HAND SURGERY Right    2016   LITHOTRIPSY     POLYPECTOMY     PROSTATE BIOPSY N/A 12/05/2021   Procedure: PROSTATE BIOPSY Addison Bailey;  Surgeon: Orson Ape, MD;  Location: [facility];  Service: Urology;  Laterality: N/A;   SHOULDER SURGERY Left    left, cyst and tumor removed   URINARY SURGERY  2018   Dr Diamantina Monks "stretched" urethra around prostate to increase urine flow    Family History  Problem Relation Age of Onset   Diabetes Mother    Depression Mother    Prostate cancer Brother    Diabetes Brother    Heart disease Brother    Diabetes Brother    Colon cancer Neg Hx    Esophageal cancer Neg Hx    Rectal cancer Neg Hx    Stomach cancer Neg Hx    Colon polyps Neg Hx    Crohn's  disease Neg Hx     Social History   Socioeconomic History   Marital status: Married    Spouse name: Not on file   Number of children: 2   Years of education: Not on file   Highest education level: Not on file  Occupational History   Occupation: Metal Fabrication    Employer: [employer]  Tobacco Use   Smoking status: Never    Passive exposure: Past   Smokeless tobacco: Never  Vaping Use   Vaping status: Never Used  Substance and Sexual Activity   Alcohol use: Yes    Comment: occasional beer   Drug use: No   Sexual activity: Not on file  Other Topics Concern   Not on file  Social History  Narrative   Daily Caffeine Use 1-2 daily   Married    Never smoker    Wears seat belt, safe in relationship    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, retired    Chief Executive Officer Drivers of Corporate investment banker Strain: Low Risk  (08/03/2023)   Overall Financial Resource Strain (CARDIA)    Difficulty of Paying Living Expenses: Not hard at all  Food Insecurity: No Food Insecurity (08/03/2023)   Hunger Vital Sign    Worried About Running Out of Food in the Last Year: Never true    Ran Out of Food in the Last Year: Never true  Transportation Needs: No Transportation Needs (08/03/2023)   PRAPARE - Administrator, Civil Service (Medical): No    Lack of Transportation (Non-Medical): No  Physical Activity: Inactive (08/03/2023)   Exercise Vital Sign    Days of Exercise per Week: 0 days    Minutes of Exercise per Session: 0 min  Stress: No Stress Concern Present (08/03/2023)   [employer] of Occupational Health - Occupational Stress Questionnaire    Feeling of Stress : Only a little  Social Connections: Moderately Integrated (08/03/2023)   Social Connection and Isolation Panel [NHANES]    Frequency of Communication with Friends and Family: More than three times a week    Frequency of Social Gatherings with Friends and Family: Once a week    Attends Religious Services: More than 4 times per year    Active Member of [employer] or Organizations: No    Attends Banker Meetings: Never    Marital Status: Married  Catering manager Violence: Not At Risk (08/03/2023)   Humiliation, Afraid, Rape, and Kick questionnaire    Fear of Current or Ex-Partner: No    Emotionally Abused: No    Physically Abused: No    Sexually Abused: No     Outpatient Medications Prior to Visit  Medication Sig Dispense Refill   Ascorbic Acid (VITAMIN C) 1000 MG tablet Take 500 mg by mouth daily.     aspirin 81 MG tablet Take 81 mg by mouth daily.     calcium carbonate (OSCAL) 1500 (600 Ca) MG TABS tablet Take 600 mg of elemental  calcium by mouth daily with breakfast.     Cholecalciferol (VITAMIN D-3) 125 MCG (5000 UT) TABS Take 5,000 Units by mouth daily.     Continuous Glucose Receiver (DEXCOM G7 RECEIVER) DEVI by Does not apply route.     Continuous Glucose Sensor (DEXCOM G7 SENSOR) MISC by Does not apply route.     Continuous Glucose Sensor (FREESTYLE LIBRE 2 SENSOR) MISC Use as directed to monitor blood glucose. Change sensor every 14 days. 2 each 5   Cyanocobalamin (VITAMIN B-12) 1000 MCG  SUBL Place 1 tablet (1,000 mcg total) under the tongue daily. 90 tablet 3   dutasteride (AVODART) 0.5 MG capsule Take 1 capsule (0.5 mg total) by mouth daily. 90 capsule 3   insulin degludec (TRESIBA FLEXTOUCH) 200 UNIT/ML FlexTouch Pen Inject 50 Units into the skin daily. 72 mL 3   Lancets (ONETOUCH ULTRASOFT) lancets Use as instructed 2x daily Dx: 250.00 100 each 5   Lancets (ONETOUCH ULTRASOFT) lancets Bid Use as instructed 200 each 12   losartan (COZAAR) 50 MG tablet Take 1 tablet (50 mg total) by mouth in the morning and at bedtime. 180 tablet 2   MAGNESIUM PO Take 1 tablet by mouth daily.     metFORMIN (GLUCOPHAGE-XR) 500 MG 24 hr tablet Take 2 tablets (1,000 mg total) by mouth 2 (two) times daily with a meal.     Na Sulfate-K Sulfate-Mg Sulf 17.5-3.13-1.6 GM/177ML SOLN See admin instructions.     omeprazole (PRILOSEC) 20 MG capsule Take 1 capsule (20 mg total) by mouth daily. 30 capsule 3   rosuvastatin (CRESTOR) 10 MG tablet Take 1 tablet (10 mg total) by mouth daily. 90 tablet 3   Semaglutide (RYBELSUS) 3 MG TABS Take 1 tablet (3 mg total) by mouth daily. 30 tablet 3   Semaglutide (RYBELSUS) 3 MG TABS Take 1 tablet (3 mg total) by mouth daily.     tamsulosin (FLOMAX) 0.4 MG CAPS capsule Take 1 capsule (0.4 mg total) by mouth daily. 90 capsule 3   vitamin E 400 UNIT capsule Take 400 Units by mouth daily.     No facility-administered medications prior to visit.    Allergies  Allergen Reactions    Sulfamethoxazole-Trimethoprim Rash    ROS Review of Systems Negative unless indicated in HPI.    Objective:    Physical Exam Constitutional:      Appearance: Normal appearance.  HENT:     Head:     Salivary Glands: Right salivary gland is not diffusely enlarged. Left salivary gland is not diffusely enlarged.     Right Ear: Tympanic membrane normal. Tympanic membrane is not erythematous.     Left Ear: Tympanic membrane normal. Tympanic membrane is not erythematous.     Nose: Congestion present.     Right Turbinates: Not enlarged.     Left Turbinates: Not enlarged.     Right Sinus: No maxillary sinus tenderness or frontal sinus tenderness.     Left Sinus: No maxillary sinus tenderness or frontal sinus tenderness.     Mouth/Throat:     Mouth: Mucous membranes are moist.     Pharynx: No pharyngeal swelling, oropharyngeal exudate or posterior oropharyngeal erythema.     Tonsils: No tonsillar exudate.  Cardiovascular:     Rate and Rhythm: Normal rate and regular rhythm.  Pulmonary:     Effort: Pulmonary effort is normal.     Breath sounds: Normal breath sounds. No stridor. No wheezing.  Neurological:     General: No focal deficit present.     Mental Status: He is alert and oriented to person, place, and time. Mental status is at baseline.  Psychiatric:        Mood and Affect: Mood normal.        Behavior: Behavior normal.        Thought Content: Thought content normal.        Judgment: Judgment normal.     BP 112/66   Pulse (!) 117   Temp 98.2 F (36.8 C)   Ht 6' (1.829 m)  Wt 191 lb 12.8 oz (87 kg)   SpO2 98%   BMI 26.01 kg/m  Wt Readings from Last 3 Encounters:  12/17/23 191 lb 12.8 oz (87 kg)  10/13/23 192 lb 4 oz (87.2 kg)  09/15/23 191 lb (86.6 kg)     Health Maintenance  Topic Date Due   COVID-19 Vaccine (4 - 2024-25 season) 01/02/2024 (Originally 09/29/2023)   HEMOGLOBIN A1C  04/11/2024   Diabetic kidney evaluation - Urine ACR  05/25/2024    OPHTHALMOLOGY EXAM  06/24/2024   FOOT EXAM  06/29/2024   Medicare Annual Wellness (AWV)  08/02/2024   Diabetic kidney evaluation - eGFR measurement  08/13/2024   DTaP/Tdap/Td (3 - Td or Tdap) 08/31/2028   Pneumonia Vaccine 70+ Years old  Completed   INFLUENZA VACCINE  Completed   Hepatitis C Screening  Completed   Zoster Vaccines- Shingrix  Completed   HPV VACCINES  Aged Out   Colonoscopy  Discontinued    There are no preventive care reminders to display for this patient.  Lab Results  Component Value Date   TSH 1.51 05/26/2023   Lab Results  Component Value Date   WBC 6.3 05/26/2023   HGB 15.6 05/26/2023   HCT 45.9 05/26/2023   MCV 94.7 05/26/2023   PLT 179.0 05/26/2023   Lab Results  Component Value Date   NA 137 08/14/2023   K 4.3 08/14/2023   CO2 29 08/14/2023   GLUCOSE 161 (H) 08/14/2023   BUN 20 08/14/2023   CREATININE 1.08 08/14/2023   BILITOT 0.8 08/14/2023   ALKPHOS 56 08/14/2023   AST 19 08/14/2023   ALT 17 08/14/2023   PROT 7.0 08/14/2023   ALBUMIN 4.2 08/14/2023   CALCIUM 10.4 08/14/2023   ANIONGAP 7 04/24/2022   GFR 67.25 08/14/2023   Lab Results  Component Value Date   CHOL 144 05/26/2023   Lab Results  Component Value Date   HDL 51.00 05/26/2023   Lab Results  Component Value Date   LDLCALC 72 05/26/2023   Lab Results  Component Value Date   TRIG 103.0 05/26/2023   Lab Results  Component Value Date   CHOLHDL 3 05/26/2023   Lab Results  Component Value Date   HGBA1C 8.4 (A) 10/13/2023      Assessment & Plan:  Influenza A Assessment & Plan: Vital signs stable, patient in is no sign of acute distress, nontoxic.  Discussed findings with the patient. Positive for Flu A and negative for COVID. -Will treat with tamiflu -Increase fluid intake, rest. -Will let us know if symptoms not improving   Orders: -     Oseltamivir Phosphate; Take 1 capsule (75 mg total) by mouth 2 (two) times daily for 5 days.  Dispense: 10 capsule; Refill:  0  Fever, unspecified fever cause -     POC COVID-19 BinaxNow -     POCT Influenza A/B    Follow-up: No follow-ups on file.   Kara Dies, NP

## 2023-12-17 NOTE — Patient Instructions (Signed)
You tested positive for FLU. Have sent Tamiflu take it twice a day for 5 days. If symptoms not improving let us know.  Take OTC plain mucinex for congestion.  Increase fluid intake.

## 2023-12-18 DIAGNOSIS — J101 Influenza due to other identified influenza virus with other respiratory manifestations: Secondary | ICD-10-CM | POA: Insufficient documentation

## 2023-12-18 NOTE — Assessment & Plan Note (Signed)
Vital signs stable, patient in is no sign of acute distress, nontoxic.  Discussed findings with the patient. Positive for Flu A and negative for COVID. -Will treat with tamiflu -Increase fluid intake, rest. -Will let us know if symptoms not improving

## 2023-12-29 ENCOUNTER — Other Ambulatory Visit: Payer: Self-pay | Admitting: Family Medicine

## 2023-12-29 DIAGNOSIS — E118 Type 2 diabetes mellitus with unspecified complications: Secondary | ICD-10-CM

## 2023-12-29 NOTE — Telephone Encounter (Signed)
 Copied from CRM [id number]. Topic: Clinical - Medication Refill >> Dec 29, 2023  3:42 PM Deaijah H wrote: Most Recent Primary Care Visit:  Provider: VINCENTE SABER  Department: [facility]  Visit Type: OFFICE VISIT  Date: 12/17/2023  Medication: Lancets (ONETOUCH ULTRASOFT) lancets  Has the patient contacted their pharmacy? Yes (Agent: If no, request that the patient contact the pharmacy for the refill. If patient does not wish to contact the pharmacy document the reason why and proceed with request.) (Agent: If yes, when and what did the pharmacy advise?) - Stated refill ahs ran out   Is this the correct pharmacy for this prescription? Yes If no, delete pharmacy and type the correct one.  This is the patient's preferred pharmacy:  [id number] - [place], [place] - [address] [place] [address] Phone: [phone] Fax: [phone]   Has the prescription been filled recently? No  Is the patient out of the medication? Yes  Has the patient been seen for an appointment in the last year OR does the patient have an upcoming appointment? Yes  Can we respond through MyChart? Yes  Agent: Please be advised that Rx refills may take up to 3 business days. We ask that you follow-up with your pharmacy.

## 2023-12-30 MED ORDER — ONETOUCH ULTRASOFT LANCETS MISC: 12 refills | Status: AC

## 2024-01-04 ENCOUNTER — Telehealth: Payer: Self-pay | Admitting: Family Medicine

## 2024-01-04 ENCOUNTER — Telehealth: Payer: Self-pay

## 2024-01-04 NOTE — Telephone Encounter (Signed)
 Left message to return call to our office.

## 2024-01-04 NOTE — Telephone Encounter (Signed)
 Copied from CRM [id number]. Topic: Clinical - Medication Question >> Jan 04, 2024  2:05 PM Earnestine Goes B wrote: Reason for CRM: Pt called to speak with Loetta Ringer, states he is returning her call please give pt a call back at [phone]

## 2024-01-04 NOTE — Telephone Encounter (Signed)
 Copied from CRM [id number]. Topic: Clinical - Medication Refill >> Jan 04, 2024  9:33 AM Howard Macho wrote: Most Recent Primary Care Visit:  Provider: Tona Francis  Department: [facility]  Visit Type: OFFICE VISIT  Date: 12/17/2023  Medication: one touch test strips  Has the patient contacted their pharmacy? Yes (Agent: If no, request that the patient contact the pharmacy for the refill. If patient does not wish to contact the pharmacy document the reason why and proceed with request.) (Agent: If yes, when and what did the pharmacy advise?)  Is this the correct pharmacy for this prescription? Yes If no, delete pharmacy and type the correct one.  This is the patient's preferred pharmacy:  [id number] - [place], [address] - [address] [place] [address] Phone: [phone] Fax: [phone]   Has the prescription been filled recently? No  Is the patient out of the medication? Yes  Has the patient been seen for an appointment in the last year OR does the patient have an upcoming appointment? Yes  Can we respond through MyChart? Yes  Agent: Please be advised that Rx refills may take up to 3 business days. We ask that you follow-up with your pharmacy.

## 2024-01-05 ENCOUNTER — Other Ambulatory Visit: Payer: Self-pay

## 2024-01-05 NOTE — Telephone Encounter (Signed)
Sent to provider to send to pharmacy.

## 2024-01-05 NOTE — Telephone Encounter (Signed)
Called and spoke to pt see previous note.

## 2024-01-06 ENCOUNTER — Ambulatory Visit: Payer: Medicare Other | Admitting: Podiatry

## 2024-01-06 ENCOUNTER — Encounter: Payer: Self-pay | Admitting: Podiatry

## 2024-01-06 DIAGNOSIS — B351 Tinea unguium: Secondary | ICD-10-CM

## 2024-01-06 DIAGNOSIS — M79674 Pain in right toe(s): Secondary | ICD-10-CM

## 2024-01-06 DIAGNOSIS — M79675 Pain in left toe(s): Secondary | ICD-10-CM

## 2024-01-06 MED ORDER — GLUCOSE BLOOD VI STRP: ORAL_STRIP | 12 refills | Status: AC

## 2024-01-06 NOTE — Progress Notes (Signed)
  Subjective:  Patient ID: [name], male    DOB: March 10, 1946,  MRN: [id number]  Chief Complaint  Patient presents with   Diabetes    "I may have an ingrown toenail that is giving me problems.  I'm a Diabetic and I take all sorts of medicines.  It's the big one on my left foot.  It seems to do well if I don't wear socks and shoes."    78 y.o. male presents with the above complaint. History confirmed with patient.   Objective:  Physical Exam: warm, good capillary refill, no trophic changes or ulcerative lesions, normal DP and PT pulses, and normal sensory exam. Left Foot: dystrophic yellowed discolored nail plates with subungual debris Right Foot: dystrophic yellowed discolored nail plates with subungual debris   Assessment:   1. Pain due to onychomycosis of toenails of both feet      Plan:  Patient was evaluated and treated and all questions answered.  Discussed the etiology and treatment options for the condition in detail with the patient. . Recommended debridement of the nails today. Sharp and mechanical debridement performed of all painful and mycotic nails today. Nails debrided in length and thickness using a nail nipper to level of comfort. Discussed treatment options including appropriate shoe gear. Follow up as needed for painful nails.  We also discussed permanent removal of the left great toenail if his relief from today's debridement is short-lived.  I discussed with him that his A1c would need to be less than 7.5% to proceed with permanent matricectomy.  Follow-up with me as needed if he is interested in this.    Return if symptoms worsen or fail to improve.

## 2024-01-13 ENCOUNTER — Telehealth: Payer: Medicare Other | Admitting: Family Medicine

## 2024-01-13 NOTE — Progress Notes (Deleted)
 Unable to connect with patient  Dana Allan, MD

## 2024-01-20 ENCOUNTER — Encounter: Payer: Self-pay | Admitting: Family Medicine

## 2024-01-20 NOTE — Progress Notes (Signed)
Could not connect with patient.

## 2024-01-21 ENCOUNTER — Ambulatory Visit (INDEPENDENT_AMBULATORY_CARE_PROVIDER_SITE_OTHER): Payer: Medicare Other | Admitting: Family Medicine

## 2024-01-21 ENCOUNTER — Encounter: Payer: Self-pay | Admitting: Family Medicine

## 2024-01-21 ENCOUNTER — Other Ambulatory Visit (INDEPENDENT_AMBULATORY_CARE_PROVIDER_SITE_OTHER): Payer: Self-pay | Admitting: Pharmacist

## 2024-01-21 VITALS — BP 136/64 | HR 82 | Temp 98.2°F | Resp 18 | Ht 72.0 in | Wt 198.4 lb

## 2024-01-21 DIAGNOSIS — I152 Hypertension secondary to endocrine disorders: Secondary | ICD-10-CM

## 2024-01-21 DIAGNOSIS — N400 Enlarged prostate without lower urinary tract symptoms: Secondary | ICD-10-CM

## 2024-01-21 DIAGNOSIS — Z7984 Long term (current) use of oral hypoglycemic drugs: Secondary | ICD-10-CM

## 2024-01-21 DIAGNOSIS — E1159 Type 2 diabetes mellitus with other circulatory complications: Secondary | ICD-10-CM | POA: Diagnosis not present

## 2024-01-21 DIAGNOSIS — G479 Sleep disorder, unspecified: Secondary | ICD-10-CM | POA: Diagnosis not present

## 2024-01-21 DIAGNOSIS — E538 Deficiency of other specified B group vitamins: Secondary | ICD-10-CM | POA: Diagnosis not present

## 2024-01-21 DIAGNOSIS — E118 Type 2 diabetes mellitus with unspecified complications: Secondary | ICD-10-CM

## 2024-01-21 LAB — COMPREHENSIVE METABOLIC PANEL
ALT: 19 U/L (ref 0–53)
AST: 18 U/L (ref 0–37)
Albumin: 4.2 g/dL (ref 3.5–5.2)
Alkaline Phosphatase: 55 U/L (ref 39–117)
BUN: 15 mg/dL (ref 6–23)
CO2: 29 meq/L (ref 19–32)
Calcium: 9.6 mg/dL (ref 8.4–10.5)
Chloride: 103 meq/L (ref 96–112)
Creatinine, Ser: 1.07 mg/dL (ref 0.40–1.50)
GFR: 67.8 mL/min (ref >60.00)
Glucose, Bld: 202 mg/dL — ABNORMAL HIGH (ref 70–99)
Potassium: 4.3 meq/L (ref 3.5–5.1)
Sodium: 138 meq/L (ref 135–145)
Total Bilirubin: 1 mg/dL (ref 0.2–1.2)
Total Protein: 7 g/dL (ref 6.0–8.3)

## 2024-01-21 LAB — VITAMIN B12: Vitamin B-12: 461 pg/mL (ref 211–911)

## 2024-01-21 LAB — POCT GLYCOSYLATED HEMOGLOBIN (HGB A1C): Hemoglobin A1C: 7.8 % — AB (ref 4.0–5.6)

## 2024-01-21 LAB — PSA: PSA: 1.06 ng/mL (ref 0.10–4.00)

## 2024-01-21 MED ORDER — EMPAGLIFLOZIN 10 MG PO TABS: 10.0000 mg | ORAL_TABLET | Freq: Every day | ORAL | 1 refills | Status: DC

## 2024-01-21 MED ORDER — EMPAGLIFLOZIN 10 MG PO TABS: 10.0000 mg | ORAL_TABLET | Freq: Every day | ORAL | Status: AC

## 2024-01-21 NOTE — Patient Instructions (Addendum)
 It was a pleasure meeting you today. Thank you for allowing me to take part in your health care.  Our goals for today as we discussed include:  Start Jardiance 10 mg daily.  Sample provided x 2 Continue Tresiba 50 units daily Continue Metformin XR 500 mg 2 tablets 2 times a day  Referral sent for sleep studies.  Call pharmacy for refills of medications.  This is a list of the screening recommended for you and due dates:  Health Maintenance  Topic Date Due   COVID-19 Vaccine (4 - 2024-25 season) 09/29/2023   Yearly kidney health urinalysis for diabetes  05/25/2024   Eye exam for diabetics  06/24/2024   Complete foot exam   06/29/2024   Hemoglobin A1C  07/20/2024   Medicare Annual Wellness Visit  08/02/2024   Yearly kidney function blood test for diabetes  08/13/2024   DTaP/Tdap/Td vaccine (3 - Td or Tdap) 08/31/2028   Pneumonia Vaccine  Completed   Flu Shot  Completed   Hepatitis C Screening  Completed   Zoster (Shingles) Vaccine  Completed   HPV Vaccine  Aged Out   Colon Cancer Screening  Discontinued      If you have any questions or concerns, please do not hesitate to call the office at [phone].  I look forward to our next visit and until then take care and stay safe.  Regards,   Dana Allan, MD   [facility]

## 2024-01-21 NOTE — Progress Notes (Signed)
 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

## 2024-01-21 NOTE — Progress Notes (Signed)
 SUBJECTIVE:   Chief Complaint  Patient presents with   Diabetes    3 month follow up   HPI Presents for follow up chronic disease management  Discussed the use of AI scribe software for clinical note transcription with the patient, who gave verbal consent to proceed.  History of Present Illness [name] is a 78 year old male with diabetes and hypertension who presents for follow-up.  He manages his diabetes with Tresiba 50 units daily and metformin, two pills in the morning and two in the evening. Despite this regimen, he experiences elevated blood sugar levels after breakfast, often exceeding 200 mg/dL, regardless of his breakfast choices, which typically include cereal or a biscuit. He has previously tried Ozempic but found it too costly and is not currently on Rybelsus due to similar cost concerns. He has not tried weekly injections like Trulicity or daily medications like Jardiance recently. His blood sugar levels are more stable in the evening, and he has reduced his lunch intake to snacks only.  His hypertension is currently managed with a home blood pressure reading of 136/64 mmHg before breakfast and medication intake, which he considers decent compared to previous levels. No chest pain or shortness of breath is reported.  He has difficulty breathing through his nose at night, leading to mouth breathing and vivid dreams. He has not seen an ENT specialist or undergone sleep studies but wants to address these issues. He experiences daytime sleepiness, often falling asleep while watching TV.  He had the flu a couple of months ago, which was difficult to recover from and left him with a persistent dry cough. He has received all recommended vaccines. He used to walk two to three times a week and aims to return to this routine, also planning to go to the gym about three days a week. No chest pain, shortness of breath, or leg swelling is reported. He is active and trying to maintain a  routine of walking and gym visits.      PERTINENT PMH / PSH: As above  OBJECTIVE:  BP 136/64   Pulse 82   Temp 98.2 F (36.8 C)   Resp 18   Ht 6' (1.829 m)   Wt 198 lb 6 oz (90 kg)   SpO2 100%   BMI 26.90 kg/m    Physical Exam Vitals reviewed.  Constitutional:      General: He is not in acute distress.    Appearance: Normal appearance. He is normal weight. He is not ill-appearing, toxic-appearing or diaphoretic.  Eyes:     General:        Right eye: No discharge.        Left eye: No discharge.  Cardiovascular:     Rate and Rhythm: Normal rate and regular rhythm.     Heart sounds: Normal heart sounds.  Pulmonary:     Effort: Pulmonary effort is normal.     Breath sounds: Normal breath sounds.  Musculoskeletal:        General: Normal range of motion.     Cervical back: Normal range of motion.  Skin:    General: Skin is warm and dry.  Neurological:     Mental Status: He is alert and oriented to person, place, and time. Mental status is at baseline.  Psychiatric:        Mood and Affect: Mood normal.        Behavior: Behavior normal.        Thought Content: Thought  content normal.        Judgment: Judgment normal.           01/21/2024    8:21 AM 01/13/2024    8:31 AM 12/17/2023    3:19 PM 10/13/2023    7:57 AM 08/14/2023    8:59 AM  Depression screen PHQ 2/9  Decreased Interest 0 0 0 0 0  Down, Depressed, Hopeless 0 0 0 0 0  PHQ - 2 Score 0 0 0 0 0  Altered sleeping 0 0 1 0 2  Tired, decreased energy 0 0 1 0 0  Change in appetite 0 0 0 0 0  Feeling bad or failure about yourself  0 0 0 0 0  Trouble concentrating 0 0 0 0 0  Moving slowly or fidgety/restless 0 0 0 0 0  Suicidal thoughts 0 0 0 0 0  PHQ-9 Score 0 0 2 0 2  Difficult doing work/chores Not difficult at all Not difficult at all Not difficult at all Not difficult at all Not difficult at all      01/21/2024    8:21 AM 01/13/2024    8:31 AM 12/17/2023    3:19 PM 10/13/2023    7:57 AM  GAD 7 :  Generalized Anxiety Score  Nervous, Anxious, on Edge 0 0 0 0  Control/stop worrying 0 0 0 0  Worry too much - different things 0 0 0 0  Trouble relaxing 0 0 0 0  Restless 0 0 0 0  Easily annoyed or irritable 0 0 0 0  Afraid - awful might happen 0 0 0 0  Total GAD 7 Score 0 0 0 0  Anxiety Difficulty Not difficult at all Not difficult at all Not difficult at all Not difficult at all    ASSESSMENT/PLAN:  Sleep difficulties Assessment & Plan: Reports difficulty sleeping due to mouth breathing and frequent awakenings. Daytime somnolence noted. -Refer to pulmonology for sleep study to evaluate for possible sleep apnea.  Orders: -     Ambulatory referral to Pulmonology  Type 2 diabetes mellitus with complications [facility]) Assessment & Plan: Suboptimal control with postprandial hyperglycemia, particularly after breakfast. Currently on Tresiba 50 units daily and Metformin. Unable to afford Rybelsus. -Start Jardiance once daily in the morning. -Check for samples of Jardiance to provide to the patient. -Advise patient about potential side effects including genitourinary infections.  Orders: -     POCT glycosylated hemoglobin (Hb A1C) -     Vitamin B12 -     Empagliflozin; Take 1 tablet (10 mg total) by mouth daily before breakfast for 14 days. -     Empagliflozin; Take 1 tablet (10 mg total) by mouth daily before breakfast.  Dispense: 30 tablet; Refill: 1  Hypertension associated with diabetes [facility]) Assessment & Plan: Well controlled. Does not check BP at home -Continue Losartan 50 mg BID -Encouraged to check BP at home and obtain home BP monitor  -Check Cmet  Orders: -     Comprehensive metabolic panel  Benign prostatic hyperplasia, unspecified whether lower urinary tract symptoms present -     PSA  B12 deficiency -     Vitamin B12     PDMP reviewed  Return in about 6 months (around 07/20/2024), or if symptoms worsen or fail to improve, for PCP.  Dana Allan, MD

## 2024-01-25 ENCOUNTER — Encounter: Payer: Self-pay | Admitting: Family Medicine

## 2024-01-25 DIAGNOSIS — G479 Sleep disorder, unspecified: Secondary | ICD-10-CM | POA: Insufficient documentation

## 2024-01-25 NOTE — Assessment & Plan Note (Signed)
 Suboptimal control with postprandial hyperglycemia, particularly after breakfast. Currently on Tresiba 50 units daily and Metformin. Unable to afford Rybelsus. -Start Jardiance once daily in the morning. -Check for samples of Jardiance to provide to the patient. -Advise patient about potential side effects including genitourinary infections.

## 2024-01-25 NOTE — Assessment & Plan Note (Signed)
 Reports difficulty sleeping due to mouth breathing and frequent awakenings. Daytime somnolence noted. -Refer to pulmonology for sleep study to evaluate for possible sleep apnea.

## 2024-01-25 NOTE — Assessment & Plan Note (Signed)
 Well controlled. Does not check BP at home -Continue Losartan 50 mg BID -Encouraged to check BP at home and obtain home BP monitor  -Check Cmet

## 2024-02-02 ENCOUNTER — Telehealth: Payer: Self-pay | Admitting: Family Medicine

## 2024-02-02 NOTE — Telephone Encounter (Signed)
 Called pt and he stated that at 11 or 12 at night is below 100

## 2024-02-02 NOTE — Telephone Encounter (Signed)
 Pt stating that his medication  empagliflozin (JARDIANCE) 10 MG TABS tablet  is lowering his sugar levels too low at night and he is not currently taking the medication. Pt is also inquiring maybe lower his dosage. Please advise.

## 2024-02-04 ENCOUNTER — Ambulatory Visit (INDEPENDENT_AMBULATORY_CARE_PROVIDER_SITE_OTHER): Admitting: Sleep Medicine

## 2024-02-04 ENCOUNTER — Other Ambulatory Visit: Payer: Self-pay | Admitting: Family Medicine

## 2024-02-04 ENCOUNTER — Encounter: Payer: Self-pay | Admitting: Sleep Medicine

## 2024-02-04 VITALS — BP 126/58 | HR 74 | Resp 14 | Ht 72.0 in | Wt 195.4 lb

## 2024-02-04 DIAGNOSIS — Z794 Long term (current) use of insulin: Secondary | ICD-10-CM | POA: Diagnosis not present

## 2024-02-04 DIAGNOSIS — G4733 Obstructive sleep apnea (adult) (pediatric): Secondary | ICD-10-CM | POA: Diagnosis not present

## 2024-02-04 DIAGNOSIS — E119 Type 2 diabetes mellitus without complications: Secondary | ICD-10-CM | POA: Diagnosis not present

## 2024-02-04 DIAGNOSIS — I10 Essential (primary) hypertension: Secondary | ICD-10-CM | POA: Diagnosis not present

## 2024-02-04 DIAGNOSIS — E1159 Type 2 diabetes mellitus with other circulatory complications: Secondary | ICD-10-CM

## 2024-02-04 NOTE — Addendum Note (Signed)
 Addended by: Tempie Hoist on: 02/04/2024 10:01 AM   Modules accepted: Level of Service

## 2024-02-04 NOTE — Progress Notes (Deleted)
 Epworth Sleepiness Scale  Use the following scale to choose the most appropriate number for each situation. 0 Would never nod off 1  Slight  chance of nodding off 2 Moderate chance of nodding off 3 High chance of nodding off  Sitting and reading: 0 Watching TV: 0 Sitting, inactive, in a public place (e.g., in a meeting, theater, or dinner event): 0 As a passenger in a car for an hour or more without stopping for a break: 3 Lying down to rest when circumstances permit:3 Sitting and talking to someone: 0 Sitting quietly after a meal without alcohol: 2 In a car, while stopped for a few  minutes in traffic or at a light: 0  TOTOAL: 8

## 2024-02-04 NOTE — Progress Notes (Addendum)
 Name:[name] MRN: [id number] DOB: [birth year]   CHIEF COMPLAINT:  EXCESSIVE DAYTIME SLEEPINESS   HISTORY OF PRESENT ILLNESS:  [name] is a 78 y.o. w/ a h/o HTN, GERD and hyperlipidemia who presents for c/o loud snoring and excessive daytime sleepiness which has been present for several years. Reports nocturnal awakenings due to unclear reasons, however does not have difficulty falling back to sleep. Reports a 30 lb weight loss over the last few years. Admits to night sweats and dry mouth. Denies morning headaches, RLS symptoms, dream enactment, cataplexy, hypnagogic or hypnapompic hallucinations. Denies a family history of sleep apnea. Denies drowsy driving. Drinks 2 cups of coffee daily, occasional alcohol use, denies tobacco or illicit drug use.   Bedtime 10 pm Sleep onset 30 mins Rise time 5-6 am   EPWORTH SLEEP SCORE     02/04/2024    9:22 AM  Results of the Epworth flowsheet  Sitting and reading 0  Watching TV 0  Sitting, inactive in a public place (e.g. a theatre or a meeting) 0  As a passenger in a car for an hour without a break 3  Lying down to rest in the afternoon when circumstances permit 3  Sitting and talking to someone 0  Sitting quietly after a lunch without alcohol 1  In a car, while stopped for a few minutes in traffic 0  Total score 7      PAST MEDICAL HISTORY :   has a past medical history of Adenomatous colon polyp (06/2001), ALLERGIC RHINITIS (08/03/2007), Allergy, Asthma, BENIGN PROSTATIC HYPERTROPHY (08/03/2007), Chronic back pain, COPD (chronic obstructive pulmonary disease) (HCC), Coronary artery disease, 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 (09/29/2008), Diabetes mellitus without complication (HCC), DIABETIC RETINOPATHY, BACKGROUND (09/29/2008), DYSPHAGIA UNSPECIFIED (09/29/2008), Dysplastic nevus (02/20/2021), ESOPHAGEAL STRICTURE (12/28/2008), GERD (11/28/2008), History of kidney stones, HYPERLIPIDEMIA (08/03/2007), Mild tricuspid  regurgitation by prior echocardiogram, OSTEOARTHRITIS, 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  has a past surgical history that includes Shoulder surgery (Left); Lithotripsy; Cholecystectomy (N/A, 11/12/2016); Hand surgery (Right); Colonoscopy; Urinary surgery (2018); dupyretens contracture; Prostate biopsy (N/A, 12/05/2021); Polypectomy; Cataract extraction w/PHACO (Right, 08/25/2023); and Cataract extraction w/PHACO (Left, 09/15/2023). Prior to Admission medications   Medication Sig Start Date End Date Taking? Authorizing Provider  Ascorbic Acid (VITAMIN C) 1000 MG tablet Take 500 mg by mouth daily.   Yes [provider]  aspirin 81 MG tablet Take 81 mg by mouth daily.   Yes [provider]  calcium carbonate (OSCAL) 1500 (600 Ca) MG TABS tablet Take 600 mg of elemental calcium by mouth daily with breakfast.   Yes [provider]  Cholecalciferol (VITAMIN D-3) 125 MCG (5000 UT) TABS Take 5,000 Units by mouth daily.   Yes [provider]  Continuous Glucose Sensor (FREESTYLE LIBRE 2 SENSOR) MISC Use as directed to monitor blood glucose. Change sensor every 14 days. 10/13/23  Yes Dana Allan, MD  Cyanocobalamin (VITAMIN B-12) 1000 MCG SUBL Place 1 tablet (1,000 mcg total) under the tongue daily. 06/30/23  Yes Dana Allan, MD  dutasteride (AVODART) 0.5 MG capsule Take 1 capsule (0.5 mg total) by mouth daily. 08/14/23  Yes Dana Allan, MD  glucose blood test strip Use as instructed 01/06/24  Yes Dana Allan, MD  insulin degludec (TRESIBA FLEXTOUCH) 200 UNIT/ML FlexTouch Pen Inject 50 Units into the skin daily. 07/30/23  Yes Dana Allan,  MD  Lancets [facility] ULTRASOFT) lancets Use as instructed 2x daily Dx: 250.00 01/15/18  Yes Romero Belling, MD  Lancets [facility] ULTRASOFT) lancets  Bid Use as instructed 12/30/23  Yes Dana Allan, MD  losartan (COZAAR) 50 MG tablet Take 1 tablet (50 mg total) by mouth in the morning and at bedtime. 08/14/23  Yes Dana Allan, MD  MAGNESIUM PO Take 1 tablet by mouth daily.   Yes [provider]  metFORMIN (GLUCOPHAGE-XR) 500 MG 24 hr tablet Take 2 tablets (1,000 mg total) by mouth 2 (two) times daily with a meal. 10/22/23  Yes Dana Allan, MD  Na Sulfate-K Sulfate-Mg Sulf 17.5-3.13-1.6 GM/177ML SOLN See admin instructions. 10/07/22  Yes [provider]  omeprazole (PRILOSEC) 20 MG capsule Take 1 capsule (20 mg total) by mouth daily. 10/13/23  Yes Dana Allan, MD  rosuvastatin (CRESTOR) 10 MG tablet Take 1 tablet (10 mg total) by mouth daily. 06/30/23  Yes Dana Allan, MD  tamsulosin (FLOMAX) 0.4 MG CAPS capsule Take 1 capsule (0.4 mg total) by mouth daily. 08/14/23  Yes Dana Allan, MD  vitamin E 400 UNIT capsule Take 400 Units by mouth daily.   Yes [provider]  empagliflozin (JARDIANCE) 10 MG TABS tablet Take 1 tablet (10 mg total) by mouth daily before breakfast for 14 days. Patient not taking: Reported on 02/04/2024 01/21/24 02/04/24  Dana Allan, MD  empagliflozin (JARDIANCE) 10 MG TABS tablet Take 1 tablet (10 mg total) by mouth daily before breakfast. Patient not taking: Reported on 02/04/2024 01/21/24   Dana Allan, MD   Allergies  Allergen Reactions   Sulfamethoxazole-Trimethoprim Rash    FAMILY HISTORY:  family history includes Depression in his mother; Diabetes in his brother, brother, and mother; Heart disease in his brother; Prostate cancer in his brother. SOCIAL HISTORY:  reports that he has never smoked. He has been exposed to tobacco smoke. He has never used smokeless tobacco. He reports that he does not currently use alcohol. He reports that he does not use drugs.   Review of Systems:  Gen:  Denies  fever, sweats, chills weight loss  HEENT: Denies blurred vision, double vision, ear pain, eye  pain, hearing loss, nose bleeds, sore throat Cardiac:  No dizziness, chest pain or heaviness, chest tightness,edema, No JVD Resp:   No cough, -sputum production, -shortness of breath,-wheezing, -hemoptysis,  Gi: Denies swallowing difficulty, stomach pain, nausea or vomiting, diarrhea, constipation, bowel incontinence Gu:  Denies bladder incontinence, burning urine Ext:   Denies Joint pain, stiffness or swelling Skin: Denies  skin rash, easy bruising or bleeding or hives Endoc:  Denies polyuria, polydipsia , polyphagia or weight change Psych:   Denies depression, insomnia or hallucinations  Other:  All other systems negative  VITAL SIGNS: BP (!) 126/58   Pulse 74   Resp 14   Ht 6' (1.829 m)   Wt 195 lb 6.4 oz (88.6 kg)   SpO2 98%   BMI 26.50 kg/m    Physical Examination:   General Appearance: No distress  EYES PERRLA, EOM intact.   NECK Supple, No JVD Mallampati IV Pulmonary: normal breath sounds, No wheezing.  CardiovascularNormal S1,S2.  No m/r/g.   Abdomen: Benign, Soft, non-tender. Skin:   warm, no rashes, no ecchymosis  Extremities: normal, no cyanosis, clubbing. Neuro:without focal findings,  speech normal  PSYCHIATRIC: Mood, affect within normal limits.   ASSESSMENT AND PLAN  OSA I suspect that OSA is likely present due to clinical presentation. Discussed the consequences of untreated sleep apnea.  Advised not to drive drowsy for safety of patient and others. Will complete further evaluation with a home sleep study and follow up to review results.    HTN Stable, on current management. Following with PCP.   DMII Stable, on current management. Following with PCP.   MEDICATION ADJUSTMENTS/LABS AND TESTS ORDERED: Recommend Sleep Study   Patient  satisfied with Plan of action and management. All questions answered  Follow up to review HST results and treatment plan.   I spent a total of 35 minutes reviewing chart data, face-to-face evaluation with the  patient, counseling and coordination of care as detailed above.    Tempie Hoist, M.D.  Sleep Medicine [facility]

## 2024-02-04 NOTE — Patient Instructions (Signed)
 [name]

## 2024-02-09 DIAGNOSIS — W010XXA Fall on same level from slipping, tripping and stumbling without subsequent striking against object, initial encounter: Secondary | ICD-10-CM | POA: Diagnosis not present

## 2024-02-09 DIAGNOSIS — S60221A Contusion of right hand, initial encounter: Secondary | ICD-10-CM | POA: Diagnosis not present

## 2024-02-09 DIAGNOSIS — S6991XA Unspecified injury of right wrist, hand and finger(s), initial encounter: Secondary | ICD-10-CM | POA: Diagnosis not present

## 2024-02-10 NOTE — Telephone Encounter (Signed)
 Called pt and advised this information. He stated that he is going on vacation next week and would call back to scheduled an appointment with Lillia Abed the pharmacist.

## 2024-03-04 ENCOUNTER — Other Ambulatory Visit: Payer: Self-pay | Admitting: Family Medicine

## 2024-03-04 DIAGNOSIS — K2100 Gastro-esophageal reflux disease with esophagitis, without bleeding: Secondary | ICD-10-CM

## 2024-03-23 DIAGNOSIS — H35379 Puckering of macula, unspecified eye: Secondary | ICD-10-CM | POA: Diagnosis not present

## 2024-03-23 DIAGNOSIS — E113393 Type 2 diabetes mellitus with moderate nonproliferative diabetic retinopathy without macular edema, bilateral: Secondary | ICD-10-CM | POA: Diagnosis not present

## 2024-03-23 DIAGNOSIS — Z961 Presence of intraocular lens: Secondary | ICD-10-CM | POA: Diagnosis not present

## 2024-03-29 ENCOUNTER — Ambulatory Visit: Admitting: Urology

## 2024-03-29 VITALS — BP 158/73 | HR 77 | Ht 72.0 in | Wt 194.4 lb

## 2024-03-29 DIAGNOSIS — Z125 Encounter for screening for malignant neoplasm of prostate: Secondary | ICD-10-CM | POA: Diagnosis not present

## 2024-03-29 DIAGNOSIS — N400 Enlarged prostate without lower urinary tract symptoms: Secondary | ICD-10-CM

## 2024-03-29 DIAGNOSIS — N529 Male erectile dysfunction, unspecified: Secondary | ICD-10-CM | POA: Diagnosis not present

## 2024-03-29 DIAGNOSIS — N486 Induration penis plastica: Secondary | ICD-10-CM

## 2024-03-29 MED ORDER — TADALAFIL 10 MG PO TABS: 10.0000 mg | ORAL_TABLET | Freq: Every day | ORAL | 6 refills | Status: AC | PRN

## 2024-03-29 MED ORDER — TAMSULOSIN HCL 0.4 MG PO CAPS: 0.4000 mg | ORAL_CAPSULE | Freq: Every day | ORAL | 3 refills | Status: DC

## 2024-03-29 MED ORDER — DUTASTERIDE 0.5 MG PO CAPS: 0.5000 mg | ORAL_CAPSULE | Freq: Every day | ORAL | 3 refills | Status: DC

## 2024-03-29 NOTE — Progress Notes (Signed)
 03/29/24 11:03 AM   [name] 03-Mar-1946 [id number]  CC: Urinary symptoms, PSA screening, history of nephrolithiasis, peyronies disease, ED  HPI: 78 year old male previously followed by Dr. Sullivan Endow for the above urologic issues, transferring his care to [facility] after his retirement.   He has a long history of obstructive urinary symptoms, reportedly underwent a microwave prostate procedure with Dr. Sullivan Endow ~2019.  Really denies any significant urinary complaints at this time on Flomax  and dutasteride .   He has a history of an mildly elevated PSA of 4.26, prostate MRI in December 2022 showed a 70 g prostate with a PI-RADS 5 lesion, he underwent MRI fusion biopsy with Dr. Sullivan Endow which was benign.  He was started on dutasteride  at that time and PSA decreased appropriately, most recently 1.06 from February 2025, corrected for finasteride 2.1.  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  He also has ED, has not recently tried medications but interested in a repeat trial of Cialis.  History of nephrolithiasis, denies any recent stone events, recent CT shows small nonobstructive left-sided renal stones.  PMH: 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   DYSPHAGIA UNSPECIFIED 09/29/2008   Dysplastic nevus 02/20/2021   L post thigh - mild    ESOPHAGEAL STRICTURE 12/28/2008   GERD 11/28/2008   History of kidney stones    HYPERLIPIDEMIA 08/03/2007   Mild tricuspid regurgitation by prior  echocardiogram    OSTEOARTHRITIS, 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   PAIN IN SOFT TISSUES OF LIMB 01/07/2008   Prostate cancer (HCC)    UNSPECIFIED ANEMIA 12/29/2007   Unspecified essential hypertension 01/07/2008   URINARY CALCULUS 09/29/2008   Urine incontinence     Surgical History: Past Surgical History:  Procedure Laterality Date   CATARACT EXTRACTION W/PHACO Right 08/25/2023   Procedure: CATARACT EXTRACTION PHACO AND INTRAOCULAR LENS PLACEMENT (IOC) RIGHT [facility]OR[facility] DIABETIC 13.53 01:17.7;  Surgeon: Clair Crews, MD;  Location: [facility];  Service: Ophthalmology;  Laterality: Right;   CATARACT EXTRACTION W/PHACO Left 09/15/2023   Procedure: CATARACT EXTRACTION PHACO AND INTRAOCULAR LENS PLACEMENT (IOC) LEFT CLAREON VIVITY TORIC DIABETIC 14.37 01:14.9;  Surgeon: Clair Crews, MD;  Location: [facility];  Service: Ophthalmology;  Laterality: Left;   CHOLECYSTECTOMY N/A 11/12/2016   Procedure: LAPAROSCOPIC CHOLECYSTECTOMY WITH INTRAOPERATIVE CHOLANGIOGRAM;  Surgeon: Gwyndolyn Lerner, MD;  Location: [facility];  Service: General;  Laterality: N/A;   COLONOSCOPY     polyp   dupyretens contracture     right hand surgery,left hand    HAND SURGERY Right    2016   LITHOTRIPSY     POLYPECTOMY     PROSTATE BIOPSY N/A 12/05/2021   Procedure: PROSTATE BIOPSY Ali Ink;  Surgeon: Rea Cambridge, MD;  Location: [facility];  Service: Urology;  Laterality: N/A;   SHOULDER SURGERY Left    left, cyst and tumor removed   URINARY SURGERY  2018   Dr Jurline Olmsted "stretched" urethra around prostate to increase urine flow  Family History: Family History  Problem Relation Age of Onset   Diabetes Mother    Depression Mother    Prostate cancer Brother    Diabetes Brother    Heart disease Brother    Diabetes Brother    Colon cancer Neg Hx    Esophageal cancer Neg Hx    Rectal cancer Neg Hx    Stomach cancer Neg Hx    Colon polyps Neg Hx    Crohn's disease Neg Hx      Social History:  reports that he has never smoked. He has been exposed to tobacco smoke. He has never used smokeless tobacco. He reports that he does not currently use alcohol. He reports that he does not use drugs.  Physical Exam: BP (!) 158/73 (BP Location: Left Arm, Patient Position: Sitting, Cuff Size: Large)   Pulse 77   Ht 6' (1.829 m)   Wt 194 lb 6.4 oz (88.2 kg)   SpO2 98%   BMI 26.37 kg/m    Constitutional:  Alert and oriented, No acute distress. Cardiovascular: No clubbing, cyanosis, or edema. Respiratory: Normal respiratory effort, no increased work of breathing. GI: Abdomen is soft, nontender, nondistended, no abdominal masses Gu: Easily palpable 2 cm peyronies plaque right dorsal base of the penis   Laboratory Data: Reviewed, see HPI  Pertinent Imaging: I have personally viewed and interpreted the CT abdomen pelvis from November 2023 showing nonobstructive left renal stones, 70 g prostate, decompressed bladder.  Assessment & Plan:   78 year old male previously followed by Dr. Sullivan Endow for a number of urologic issues.  PSA screening : recent PSA normal at 1.06, corrected for dutasteride  2.1.  He would like to continue screening despite guidelines that do not recommend routine screening after age 33.  BPH: Symptoms well-controlled on Flomax  and dutasteride , medications refilled  ED: Interested in trial of Cialis, 10 to 20 mg on demand, risk and benefits discussed  Peyronies disease: We reviewed options like Xiaflex injections, surgical treatments, or penile prosthesis.  He would like to start with a trial of Cialis, may consider referral to Dr. Jarvis Mesa to consider more aggressive treatments in the future, but likely prefers observation alone  Nephrolithiasis: Stable nonobstructing left renal stones, stone prevention strategies discussed, recommended stopping vitamin C  RTC 1 year PSA prior, PVR  I spent 65 total minutes on the day of the encounter including  pre-visit review of the medical record, face-to-face time with the patient, and post visit ordering of labs/imaging/tests.  Extensive review of prior urologic records and imaging  Jay Meth, MD 03/29/2024  [facility] [address], [address] [place], [place] [address] [phone]

## 2024-03-29 NOTE — Patient Instructions (Addendum)
 Visit [email or web address] for more information about Peyronies disease and treatment options  Nocturia refers to the need to wake up during the night to urinate, which can disrupt your sleep and impact your overall well-being. Fortunately, there are several strategies you can employ to help prevent or manage nocturia. It's important to consult with your healthcare provider before making any significant changes to your routine. Here are some helpful strategies to consider:  Limit Fluid Intake Before Bed: Avoid drinking large amounts of fluids in the evening, especially within a few hours of bedtime. Consume most of your daily fluid intake earlier in the day to reduce the need to urinate at night.  Monitor Your Diet: Limit your intake of caffeine and alcohol, as these substances can increase urine production and irritate the bladder.  Avoid diet, "zero calorie," and artificially sweetened drinks, especially sodas, in the afternoon or evening. Be mindful of consuming foods and drinks with high water content before bedtime, such as watermelon and herbal teas.  Time Your Medications: If you're taking medications that contribute to increased urination, consult your healthcare provider about adjusting the timing of these medications to minimize their impact during the night.  Practice Double Voiding: Before going to bed, make an effort to empty your bladder twice within a short period. This can help reduce the amount of urine left in your bladder before sleep.  Bladder Training: Gradually increase the time between bathroom visits during the day to train your bladder to hold larger volumes of urine. Over time, this can help reduce the frequency of nighttime awakenings to urinate.  Elevate Your Legs During the Day: Elevating your legs during the day can help minimize fluid retention in your lower extremities, which might reduce nighttime urination.  Pelvic Floor Exercises: Strengthening your pelvic floor  muscles through Kegel exercises can help improve bladder control and potentially reduce the urge to urinate at night.  Create a Relaxing Bedtime Routine: Stress and anxiety can exacerbate nocturia. Engage in calming activities before bed, such as reading, listening to soothing music, or practicing relaxation techniques.  Stay Active: Engage in regular physical activity, but avoid intense exercise close to bedtime, as this can increase your body's demand for fluids.  Maintain a Healthy Weight: Excess weight can compress the bladder and contribute to bladder and urinary issues. Aim to achieve and maintain a healthy weight through a balanced diet and regular exercise.  Remember that every individual is unique, and the effectiveness of these strategies may vary. It's important to work with your healthcare provider to develop a plan that suits your specific needs and addresses any underlying causes of nocturia.      Prostate Cancer Screening  Prostate cancer screening is testing that is done to check for the presence of prostate cancer in men. The prostate gland is a walnut-sized gland that is located below the bladder and in front of the rectum in males. The function of the prostate is to add fluid to semen during ejaculation. Prostate cancer is one of the most common types of cancer in men. Who should have prostate cancer screening? Screening recommendations vary based on age and other risk factors, as well as between the professional organizations who make the recommendations. In general, screening is recommended if: You are age 52 to 67 and have an average risk for prostate cancer. You should talk with your health care provider about your need for screening and how often screening should be done. Because most prostate cancers are slow growing and will  not cause death, screening in this age group is generally reserved for men who have a 10- to 15-year life expectancy. You are younger than age  52, and you have these risk factors: Having a father, brother, or uncle who has been diagnosed with prostate cancer. The risk is higher if your family member's cancer occurred at an early age or if you have multiple family members with prostate cancer at an early age. Being a male who is [place] or is of [place] or sub-Saharan African descent. In general, screening is not recommended if: You are younger than age 49. You are between the ages of 41 and 6 and you have no risk factors. You are 81 years of age or older. At this age, the risks that screening can cause are greater than the benefits that it may provide. If you are at high risk for prostate cancer, your health care provider may recommend that you have screenings more often or that you start screening at a younger age. How is screening for prostate cancer done? The recommended prostate cancer screening test is a blood test called the prostate-specific antigen (PSA) test. PSA is a protein that is made in the prostate. As you age, your prostate naturally produces more PSA. Abnormally high PSA levels may be caused by: Prostate cancer. An enlarged prostate that is not caused by cancer (benign prostatic hyperplasia, or BPH). This condition is very common in older men. A prostate gland infection (prostatitis) or urinary tract infection. Certain medicines such as male hormones (like testosterone ) or other medicines that raise testosterone  levels. A rectal exam may be done as part of prostate cancer screening to help provide information about the size of your prostate gland. When a rectal exam is performed, it should be done after the PSA level is drawn to avoid any effect on the results. Depending on the PSA results, you may need more tests, such as: A physical exam to check the size of your prostate gland, if not done as part of screening. Blood and imaging tests. A procedure to remove tissue samples from your prostate gland for testing (biopsy).  This is the only way to know for certain if you have prostate cancer. What are the benefits of prostate cancer screening? Screening can help to identify cancer at an early stage, before symptoms start and when the cancer can be treated more easily. There is a small chance that screening may lower your risk of dying from prostate cancer. The chance is small because prostate cancer is a slow-growing cancer, and most men with prostate cancer die from a different cause. What are the risks of prostate cancer screening? The main risk of prostate cancer screening is diagnosing and treating prostate cancer that would never have caused any symptoms or problems. This is called overdiagnosisand overtreatment. PSA screening cannot tell you if your PSA is high due to cancer or a different cause. A prostate biopsy is the only procedure to diagnose prostate cancer. Even the results of a biopsy may not tell you if your cancer needs to be treated. Slow-growing prostate cancer may not need any treatment other than monitoring, so diagnosing and treating it may cause unnecessary stress or other side effects. Questions to ask your health care provider When should I start prostate cancer screening? What is my risk for prostate cancer? How often do I need screening? What type of screening tests do I need? How do I get my test results? What do my results mean? Do I  need treatment? Where to find more information The American Cancer Society: www.cancer.org American Urological Association: www.auanet.org Contact a health care provider if: You have difficulty urinating. You have pain when you urinate or ejaculate. You have blood in your urine or semen. You have pain in your back or in the area of your prostate. Summary Prostate cancer is a common type of cancer in men. The prostate gland is located below the bladder and in front of the rectum. This gland adds fluid to semen during ejaculation. Prostate cancer screening  may identify cancer at an early stage, when the cancer can be treated more easily and is less likely to have spread to other areas of the body. The prostate-specific antigen (PSA) test is the recommended screening test for prostate cancer, but it has associated risks. Discuss the risks and benefits of prostate cancer screening with your health care provider. If you are age 20 or older, the risks that screening can cause are greater than the benefits that it may provide. This information is not intended to replace advice given to you by your health care provider. Make sure you discuss any questions you have with your health care provider. Document Revised: 05/06/2021 Document Reviewed: 05/06/2021 Elsevier Patient Education  2024 [employer].

## 2024-04-03 ENCOUNTER — Other Ambulatory Visit: Payer: Self-pay | Admitting: Family Medicine

## 2024-04-03 DIAGNOSIS — E118 Type 2 diabetes mellitus with unspecified complications: Secondary | ICD-10-CM

## 2024-04-08 DIAGNOSIS — M542 Cervicalgia: Secondary | ICD-10-CM | POA: Diagnosis not present

## 2024-04-08 DIAGNOSIS — M79601 Pain in right arm: Secondary | ICD-10-CM | POA: Diagnosis not present

## 2024-04-08 DIAGNOSIS — M25511 Pain in right shoulder: Secondary | ICD-10-CM | POA: Diagnosis not present

## 2024-04-15 DIAGNOSIS — M542 Cervicalgia: Secondary | ICD-10-CM | POA: Diagnosis not present

## 2024-04-15 DIAGNOSIS — M47812 Spondylosis without myelopathy or radiculopathy, cervical region: Secondary | ICD-10-CM | POA: Diagnosis not present

## 2024-04-15 DIAGNOSIS — M7581 Other shoulder lesions, right shoulder: Secondary | ICD-10-CM | POA: Diagnosis not present

## 2024-04-15 DIAGNOSIS — M19011 Primary osteoarthritis, right shoulder: Secondary | ICD-10-CM | POA: Diagnosis not present

## 2024-04-20 ENCOUNTER — Ambulatory Visit: Payer: Medicare Other | Admitting: Family Medicine

## 2024-04-20 ENCOUNTER — Ambulatory Visit: Payer: Self-pay | Admitting: Family Medicine

## 2024-04-20 ENCOUNTER — Encounter: Payer: Self-pay | Admitting: Family Medicine

## 2024-04-20 VITALS — BP 124/66 | HR 81 | Temp 98.4°F | Resp 20 | Ht 72.0 in | Wt 185.1 lb

## 2024-04-20 DIAGNOSIS — E1169 Type 2 diabetes mellitus with other specified complication: Secondary | ICD-10-CM

## 2024-04-20 DIAGNOSIS — E118 Type 2 diabetes mellitus with unspecified complications: Secondary | ICD-10-CM | POA: Diagnosis not present

## 2024-04-20 DIAGNOSIS — E785 Hyperlipidemia, unspecified: Secondary | ICD-10-CM | POA: Diagnosis not present

## 2024-04-20 DIAGNOSIS — E1159 Type 2 diabetes mellitus with other circulatory complications: Secondary | ICD-10-CM

## 2024-04-20 DIAGNOSIS — Z794 Long term (current) use of insulin: Secondary | ICD-10-CM | POA: Diagnosis not present

## 2024-04-20 DIAGNOSIS — Z7984 Long term (current) use of oral hypoglycemic drugs: Secondary | ICD-10-CM | POA: Diagnosis not present

## 2024-04-20 DIAGNOSIS — I152 Hypertension secondary to endocrine disorders: Secondary | ICD-10-CM

## 2024-04-20 LAB — POCT GLYCOSYLATED HEMOGLOBIN (HGB A1C): Hemoglobin A1C: 8.5 % — AB (ref 4.0–5.6)

## 2024-04-20 MED ORDER — METFORMIN HCL ER 500 MG PO TB24: 1000.0000 mg | ORAL_TABLET | Freq: Two times a day (BID) | ORAL | 3 refills | Status: DC

## 2024-04-20 NOTE — Patient Instructions (Addendum)
 It was a pleasure meeting you today. Thank you for allowing me to take part in your health care.  Our goals for today as we discussed include:  Take Metformin  XR 500 mg two tablets 2 times a day Continue Tresiba  50 units daily  Continue to monitor blood glucose  Will reach out to Adrian to have her follow up with you  If blood glucose do not decrease will send referral to Endocrinology  Follow up in 3 months    This is a list of the screening recommended for you and due dates:  Health Maintenance  Topic Date Due   COVID-19 Vaccine (4 - 2024-25 season) 09/29/2023   Yearly kidney health urinalysis for diabetes  05/25/2024   Flu Shot  06/24/2024   Eye exam for diabetics  06/24/2024   Complete foot exam   06/29/2024   Medicare Annual Wellness Visit  08/02/2024   Hemoglobin A1C  10/21/2024   Yearly kidney function blood test for diabetes  01/20/2025   DTaP/Tdap/Td vaccine (3 - Td or Tdap) 08/31/2028   Pneumonia Vaccine  Completed   Hepatitis C Screening  Completed   Zoster (Shingles) Vaccine  Completed   HPV Vaccine  Aged Out   Meningitis B Vaccine  Aged Out   Colon Cancer Screening  Discontinued      If you have any questions or concerns, please do not hesitate to call the office at [phone].  I look forward to our next visit and until then take care and stay safe.  Regards,   Valli Gaw, MD   [facility]

## 2024-04-20 NOTE — Progress Notes (Signed)
 SUBJECTIVE:   Chief Complaint  Patient presents with   Diabetes    3 month follow up   HPI Presents for follow up chronic disease management  Discussed the use of AI scribe software for clinical note transcription with the patient, who gave verbal consent to proceed.  History of Present Illness [name] is a 78 year old male with diabetes who presents for a routine checkup and follow-up on his diabetes management.  He notes an increase in his A1c levels, which he attributes to recent medication changes, specifically the use of prednisone, which he believes has affected his blood sugar levels.  He experienced a fall in the yard a couple of months ago, resulting in a pinched nerve in his neck. He is currently taking pain medication four times a day and using a heating pad for relief.  His blood sugar levels at home have been high, with readings in the 200s and 300s. He is taking 50 units of Tresiba  and metformin , two tablets in the morning and two in the evening. He stopped taking Jardiance  two weeks ago after not receiving a refill, as it was causing hypoglycemia.  He is consuming no more than two meals a day and acknowledges inadequate hydration, as indicated by the color of his urine. No chest pain or shortness of breath. He reports increased urination but not more than usual.  He is concerned about his blood pressure dropping when he takes his medication and then engages in physical activity, such as mowing the yard.     PERTINENT PMH / PSH: As above  OBJECTIVE:  BP 124/66   Pulse 81   Temp 98.4 F (36.9 C)   Resp 20   Ht 6' (1.829 m)   Wt 185 lb 2 oz (84 kg)   SpO2 99%   BMI 25.11 kg/m    Physical Exam Vitals reviewed.  Constitutional:      General: He is not in acute distress.    Appearance: Normal appearance. He is normal weight. He is not ill-appearing, toxic-appearing or diaphoretic.  Eyes:     General:        Right eye: No discharge.        Left eye:  No discharge.  Cardiovascular:     Rate and Rhythm: Normal rate and regular rhythm.     Heart sounds: Normal heart sounds.  Pulmonary:     Effort: Pulmonary effort is normal.     Breath sounds: Normal breath sounds.  Abdominal:     General: Bowel sounds are normal.  Musculoskeletal:        General: Normal range of motion.     Cervical back: Normal range of motion.  Skin:    General: Skin is warm and dry.  Neurological:     Mental Status: He is alert and oriented to person, place, and time. Mental status is at baseline.  Psychiatric:        Mood and Affect: Mood normal.        Behavior: Behavior normal.        Thought Content: Thought content normal.        Judgment: Judgment normal.           04/20/2024    9:05 AM 01/21/2024    8:21 AM 01/13/2024    8:31 AM 12/17/2023    3:19 PM 10/13/2023    7:57 AM  Depression screen PHQ 2/9  Decreased Interest 0 0 0 0 0  Down, Depressed,  Hopeless 0 0 0 0 0  PHQ - 2 Score 0 0 0 0 0  Altered sleeping 0 0 0 1 0  Tired, decreased energy 0 0 0 1 0  Change in appetite 0 0 0 0 0  Feeling bad or failure about yourself  0 0 0 0 0  Trouble concentrating 0 0 0 0 0  Moving slowly or fidgety/restless 0 0 0 0 0  Suicidal thoughts 0 0 0 0 0  PHQ-9 Score 0 0 0 2 0  Difficult doing work/chores Not difficult at all Not difficult at all Not difficult at all Not difficult at all Not difficult at all      04/20/2024    9:05 AM 01/21/2024    8:21 AM 01/13/2024    8:31 AM 12/17/2023    3:19 PM  GAD 7 : Generalized Anxiety Score  Nervous, Anxious, on Edge 0 0 0 0  Control/stop worrying 0 0 0 0  Worry too much - different things 0 0 0 0  Trouble relaxing 0 0 0 0  Restless 0 0 0 0  Easily annoyed or irritable 0 0 0 0  Afraid - awful might happen 0 0 0 0  Total GAD 7 Score 0 0 0 0  Anxiety Difficulty Not difficult at all Not difficult at all Not difficult at all Not difficult at all    ASSESSMENT/PLAN:  Type 2 diabetes mellitus with complications  (HCC) Assessment & Plan: A1c at 8.5%. Recently had steroid injection in Right shoulder for OA flare. Current regimen includes Tresiba  and metformin . Reports high blood glucose levels. Previously prescribed Jardiance  but did not pick up prescription.  Continues to have low glucose in am but concerned for high sugars after meals. - Continue metformin  to two tablets twice daily. - Continue Tresiba  50 units at night - Consider s/s insulin .  Will need to be educated on checking glucose to adjust dosing. - Consult pharmacist Heidi Llamas for medication assistance.   Orders: -     POCT glycosylated hemoglobin (Hb A1C) -     metFORMIN  HCl ER; Take 2 tablets (1,000 mg total) by mouth 2 (two) times daily with a meal.  Dispense: 360 tablet; Refill: 3  Hyperlipidemia associated with type 2 diabetes mellitus (HCC) Assessment & Plan: Chronic.  Tolerating statin therapy Continue Crestor  10 mg daily    Hypertension associated with diabetes (HCC) Assessment & Plan: Well controlled.  -Continue Losartan  50 mg BID -Encouraged to check BP at home and obtain home BP monitor      PDMP reviewed  Return in about 3 months (around 07/21/2024) for PCP.  Valli Gaw, MD

## 2024-04-25 ENCOUNTER — Encounter: Payer: Self-pay | Admitting: Family Medicine

## 2024-04-25 NOTE — Assessment & Plan Note (Addendum)
 A1c at 8.5%. Recently had steroid injection in Right shoulder for OA flare. Current regimen includes Tresiba  and metformin . Reports high blood glucose levels. Previously prescribed Jardiance  but did not pick up prescription.  Continues to have low glucose in am but concerned for high sugars after meals. - Continue metformin  to two tablets twice daily. - Continue Tresiba  50 units at night - Consider s/s insulin .  Will need to be educated on checking glucose to adjust dosing. - Consult pharmacist Heidi Llamas for medication assistance.

## 2024-04-25 NOTE — Assessment & Plan Note (Signed)
 Well controlled.  -Continue Losartan  50 mg BID -Encouraged to check BP at home and obtain home BP monitor

## 2024-04-25 NOTE — Assessment & Plan Note (Signed)
 Chronic.  Tolerating statin therapy Continue Crestor 10 mg daily

## 2024-04-26 ENCOUNTER — Other Ambulatory Visit: Admitting: Pharmacist

## 2024-04-26 DIAGNOSIS — E118 Type 2 diabetes mellitus with unspecified complications: Secondary | ICD-10-CM

## 2024-04-26 MED ORDER — FREESTYLE LIBRE 3 READER DEVI: 0 refills | Status: AC

## 2024-04-26 MED ORDER — INSULIN LISPRO 100 UNIT/ML IJ SOLN: INTRAMUSCULAR | 2 refills | Status: DC

## 2024-04-26 MED ORDER — FREESTYLE LIBRE 3 PLUS SENSOR MISC: 4 refills | Status: AC

## 2024-04-26 NOTE — Progress Notes (Signed)
 04/26/2024 Name: [name] MRN: [id number] DOB: 1946-02-19  Subjective  Chief Complaint  Patient presents with   Diabetes   Reason for visit: ?  [name] is a 78 y.o. male with a history of diabetes (type 2), who presents today for a follow up diabetes pharmacotherapy visit.? Pertinent PMH also includes HTN, CAD, Asthma/emphysema, GERD, chronic constipation, FLD, cholecystitis, HLD, CKD, hx kidney stones.  Care Team: Primary Care Provider: Valli Gaw, MD  Known DM Complications: retinopathy, HTN, CAD, Diabetic Kidney Disease with microalbuminuria, hx retinopathy documented   Date of Last Diabetes Related Visit: with PCP on 04/20/24  At Last Diabetes Related Visit: ?  5/28: Consider SSI. Will need to be educated on checking glucose to adjust dosing.   Medication Access/Adherence: Prescription drug coverage: Payor: [employer]vertising copywriter MEDICARE / Plan: UHC MEDICARE / Product Type: *No Product type* / .  Reports that all medications are not affordable. Rybelsus  previously was covered though reports copay increased to > $200  Current Patient Assistance: None (reports living in a household of 2 and reports income >$60k as wife works) Medication Adherence: Patient denies missing doses of their medication.    Since Last visit / History of Present Illness: ?  Since last visit with PCP, patient notes BG has bene running higher the past 2-3 weeks. Attributes this to pinched nerve in neck. On short-course oral steroid. Also w recent steroid injection in R shoulder (OA).  Reported DM Regimen: ?  Metformin  XR 1000 mg twice daily Tresiba  (basaglar ) 50 units daily (morning)   DM medications tried in the past:?  Ozempic  (too much weight loss) Rybelsus  (having overnight lows on Tresiba  though attributed these to Rybelsus . Cost.)  SMBG Per BG meter: ? Reports Jerrilyn Moras 2 stopped working. Requested replacement directly from [employer]. Received though has not started using. In the meantime has been  using glucometer twice daily.    FBG: 100s-110s mg/dL    Evening: >440 mg/dL after breakfast    Hypo/Hyperglycemia: ?  Symptoms of hypoglycemia since last visit:? No Symptoms of hyperglycemia since last visit:? no  Exercise: Previously had started walking a couple times per week. Reports goal of increasing days/week.   Diet:  Breakfast: Bowl of cereal or biscuit  Lunch: Fruit Dinner: Largest meal. Out to eat often ([employer]). Previously was making salads at home though has not done this lately.   DM Prevention:  Statin: Taking; moderate intensity.?  History of chronic kidney disease? yes History of albuminuria? yes, last UACR on 05/26/23 = 409 mg/g ACE/ARB - Taking losartan  50 mg daily; Urine MA/CR Ratio - elevated urinary albumin excretion.  Last eye exam: 06/25/23  Last foot exam: 06/30/2023 Tobacco Use: None; Previous use of cigars  Immunizations:? Flu: Up to Date (last received 08/04/23); Pneumococcal: Up to Date PCV13 (01/23/17) PCV20 (06/27/22) PPSV23 (09/25/03, 03/27/15; received after age 71); Shingrix : Up to date (Last 01/2023, 05/2023); Covid (Up to date, Booster 08/04/23)  Cardiovascular Risk Reduction History of clinical ASCVD? no The 10-year ASCVD risk score (Arnett DK, et al., 2019) is: 41.7% History of heart failure? no  History of hyperlipidemia? yes Current BMI: 25.9 kg/m2 (Ht 72 in, Wt 86.6 kg) Taking statin? yes; moderate intensity (rosuvastatin  10 mg daily) Taking aspirin? unclear if indicated; Taking   Taking SGLT-2i? no Taking GLP- 1 RA? no    _______________________________________________  Objective    Review of Systems:?  Constitutional:? No fever, chills or unintentional weight loss  Cardiovascular:? No chest pain or pressure, shortness of breath,  dyspnea on exertion, orthopnea or LE edema  Pulmonary:? No cough or shortness of breath  GI:? No nausea, vomiting, constipation, diarrhea, abdominal pain, dyspepsia, change in bowel habits  Endocrine:? No polyuria,  polyphagia or blurred vision  Psych:? No depression, anxiety, insomnia    Physical Examination:  Vitals:  Wt Readings from Last 3 Encounters:  04/20/24 185 lb 2 oz (84 kg)  03/29/24 194 lb 6.4 oz (88.2 kg)  02/04/24 195 lb 6.4 oz (88.6 kg)   BP Readings from Last 3 Encounters:  04/20/24 124/66  03/29/24 (!) 158/73  02/04/24 (!) 126/58   Pulse Readings from Last 3 Encounters:  04/20/24 81  03/29/24 77  02/04/24 74   Labs:?  Lab Results  Component Value Date   HGBA1C 8.5 (A) 04/20/2024   HGBA1C 7.8 (A) 01/21/2024   HGBA1C 8.4 (A) 10/13/2023   GLUCOSE 202 (H) 01/21/2024   MICRALBCREAT 40.9 (H) 05/26/2023   MICRALBCREAT 2.1 10/01/2021   MICRALBCREAT 19 05/29/2020   CREATININE 1.07 01/21/2024   CREATININE 1.08 08/14/2023   CREATININE 0.96 05/26/2023   GFR 67.80 01/21/2024   GFR 67.25 08/14/2023   GFR 77.58 05/26/2023   Lab Results  Component Value Date   CHOL 144 05/26/2023   LDLCALC 72 05/26/2023   LDLCALC 18 10/07/2022   LDLCALC 16 02/14/2022   LDLDIRECT 46.0 08/31/2018   LDLDIRECT 43.0 08/25/2017   HDL 51.00 05/26/2023   TRIG 103.0 05/26/2023   TRIG 109.0 10/07/2022   TRIG 92.0 02/14/2022   ALT 19 01/21/2024   ALT 17 08/14/2023   AST 18 01/21/2024   AST 19 08/14/2023     Chemistry      Component Value Date/Time   NA 138 01/21/2024 0859   K 4.3 01/21/2024 0859   CL 103 01/21/2024 0859   CO2 29 01/21/2024 0859   BUN 15 01/21/2024 0859   CREATININE 1.07 01/21/2024 0859      Component Value Date/Time   CALCIUM  9.6 01/21/2024 0859   ALKPHOS 55 01/21/2024 0859   AST 18 01/21/2024 0859   ALT 19 01/21/2024 0859   BILITOT 1.0 01/21/2024 0859     The 10-year ASCVD risk score (Arnett DK, et al., 2019) is: 41.7%  Assessment and Plan:   1. Diabetes, type 2: uncontrolled per last A1c of 8.5% (04/20/24), increased from previous 7.8% 2/2 dietary changes (eating out most nights). Goal <7% without hypoglycemia. Patient notes further worsening of glycemic control  acutely 2/2 oral steroid for pinched nerve. New Libre 2 reader from [employer] delivered but not using currently. Could also try for [facility] 3 (test claim showing $0).  FBG well-controlled in the low 100s limiting further basal titration. Metformin  dose maximized. PCP noted interest in SSI, patient is open to prandial injection . Will start low dose once daily until re-established on CGM.  Current Regimen: Tresiba  50 units AM, metformin  1000 mg BID Start Humalog ($35/mo) - Initial dose 5 unit AC dinner.  Libre 3+ Rx refill [facility] Diet: Worsening glycemic control seemingly 2/2 dietary changes. Had been making salads at home most night, now eating out daily. Advised increasing protein w breakfast given eating carb only. Aim for more lean proteins/vegetables, limit carb portions with dinner.  Exercise: Increasing #walks/week   SMBG: Libre 3+ covered on test claim ($0 for both reader and sensor).  Future Consideration: GLP1-RA: Too much weight loss on Ozempic , BMI 25.9. Reasonable to try alternative agent with less weight loss (Trulicity, Victoza) cost is a barrier at this time.  SGLT2i: Previously discussed: Patient hesitant to start additional diabetes medication. Discussed kidney benefit again today. Not started due to cost, assess in 2025 once Medicare resets.  Alternative: Brenzavvy available for ~$50/month through [facility].  Patient open to this.  TZD: Avoiding due to possible weight gain/increase in fracture risk. DPP4i: Reasonable though also cost prohibitive.    Follow Up Follow up with PCP scheduled September Check in with clinical pharmacist via phone in ~1 week to ensure no issues with new insulin .  Patient given direct line for questions regarding medication therapy  Future Appointments  Date Time Provider Department Center  05/03/2024  2:30 PM [facility] PHARMACIST [facility]  08/01/2024  9:00 AM Jeffie Mingle, MD [facility]  08/03/2024  9:30 AM [facility] ANNUAL  WELLNESS VISIT [facility]  12/19/2024  1:00 PM Jacklin Mascot, MD [facility]  03/29/2025 10:30 AM Lawerence Pressman, MD [facility] None    Daron Ellen, PharmD Clinical Pharmacist [facility] [phone]

## 2024-04-29 ENCOUNTER — Other Ambulatory Visit: Payer: Self-pay | Admitting: Pharmacist

## 2024-04-29 DIAGNOSIS — E118 Type 2 diabetes mellitus with unspecified complications: Secondary | ICD-10-CM

## 2024-04-29 MED ORDER — INSULIN LISPRO (1 UNIT DIAL) 100 UNIT/ML (KWIKPEN): PEN_INJECTOR | SUBCUTANEOUS | 2 refills | Status: AC

## 2024-04-29 MED ORDER — INSULIN SYRINGE 31G X 5/16" 0.3 ML MISC: 0 refills | Status: AC

## 2024-05-03 ENCOUNTER — Other Ambulatory Visit: Admitting: Pharmacist

## 2024-05-03 DIAGNOSIS — E118 Type 2 diabetes mellitus with unspecified complications: Secondary | ICD-10-CM

## 2024-05-03 NOTE — Patient Instructions (Signed)
 [name],   It was a pleasure to speak with you today! As we discussed:?   Continue Humalog  5 units once daily - Fifteen minutes prior to largest meal  Decrease Tresiba  from 50 units to 45 units once daily  Please reach out prior to your next scheduled appointment should you have any questions or concerns.   Thank you!   Future Appointments  Date Time Provider Department Center  08/01/2024  9:00 AM Jeffie Mingle, MD [facility]  08/03/2024  9:30 AM [facility] ANNUAL WELLNESS VISIT [facility]  12/19/2024  1:00 PM Jacklin Mascot, MD [facility]  03/29/2025 10:30 AM Lawerence Pressman, MD [facility] None    Daron Ellen, PharmD Clinical Pharmacist [facility] [phone]

## 2024-05-03 NOTE — Progress Notes (Signed)
 Brief Telephone Documentation Reason for Call: Insulin  monitoring  Summary of Call: Called patient to check in regarding prandial insulin  start/access.   Patient confirms that he has obtained Humalog  pens.  Started using 2 days ago. Confirms using 5 unit dose.   Reported Regimen:  Tresiba  50 units once daily  Humalog  5 units (reports using with breakfast rather than with dinner).  Metformin  XR 1000 mg twice daily   SMBG: Glucometer (received sensors, has not set up new meter yet, plans to do so in the next couple of days) Notes FBG the past couple of days = 77, 102, 55 mg/dL.    Assessment/Plan: Not wearing Jerrilyn Moras though plans to place sensor this week. FBG on glucometer on the lower end with one instance of <70 mg/dL.  No c/f lows after Humalog . Encouraged Libre resumption. In the meantime, reduce basal 10% to reduce risk of fasting lows.  Decrease Tresiba  to 45 units once daily (10%??)  Continue Humalog  5 units AC   Follow Up: Brief phone check-in at end of week  (insulin  adjustment/Libre setup help if needed)  Patient given direct line for further questions/concerns.   Future Appointments  Date Time Provider Department Center  05/06/2024 11:00 AM [facility] PHARMACIST [facility]  08/01/2024  9:00 AM Jeffie Mingle, MD [facility]  08/03/2024  9:30 AM [facility] ANNUAL WELLNESS VISIT [facility]  12/19/2024  1:00 PM Jacklin Mascot, MD [facility]  03/29/2025 10:30 AM Lawerence Pressman, MD [facility] None   Daron Ellen, PharmD Clinical Pharmacist [facility] [phone]

## 2024-05-05 ENCOUNTER — Other Ambulatory Visit: Payer: Self-pay | Admitting: Family Medicine

## 2024-05-05 DIAGNOSIS — E1169 Type 2 diabetes mellitus with other specified complication: Secondary | ICD-10-CM

## 2024-05-06 ENCOUNTER — Other Ambulatory Visit (INDEPENDENT_AMBULATORY_CARE_PROVIDER_SITE_OTHER): Admitting: Pharmacist

## 2024-05-06 DIAGNOSIS — E118 Type 2 diabetes mellitus with unspecified complications: Secondary | ICD-10-CM

## 2024-05-06 NOTE — Progress Notes (Signed)
 Brief Telephone Documentation Reason for Call: Insulin  monitoring  Summary of recent change: 6/10:  ?? Tresiba  10% - 50 to 45 units daily 2/2 fasting hypoglycemia. Continue Humalog  5 units once daily AC largest meal Has received new Libre 3 reader and sensors. Plans to set up by end of week (6/13)  Reported Regimen:  Tresiba  45 units once daily  Humalog  5 units twice daily (AC breakfast/dinner) Metformin  XR 1000 mg twice daily   SMBG: Libre 3+ (uses reader - unsure if his phone is compatible, typically prefers assistance with technology)  Set up his new Libre 3+ reader/sensors a couple of days ago. BG data therefore is quite limited 7-day average = 162 mg/dL  Assessment/Plan: Patient feels sugars are up a little bit from previous. Reports one low on 6/11 (high 60s). Will plan to hold doses where they are for now - will re-evaluate early next week once his Jerrilyn Moras has had time to collect some data. Suspect need for further titration, thouo Unable to assess Humalog  efficacy per no BG readings. However, no low alarms during the day since starting. Will assess Jerrilyn Moras data next week for titration as needed.  Advised patient to pay attention to his FBG numbers over the weekend. We will discuss basal dose next week Continue Tresiba  45 units once daily  Continue Humalog  5 units BIDAC (breakfast/dinner)  Follow Up: Phone check in ~1 week with PharmD  Patient given direct line for further questions/concerns.   Future Appointments  Date Time Provider Department Center  05/13/2024  9:30 AM [facility] PHARMACIST [facility]  08/01/2024  9:00 AM Jeffie Mingle, MD [facility]  08/03/2024  9:30 AM [facility] ANNUAL WELLNESS VISIT [facility]  12/19/2024  1:00 PM Jacklin Mascot, MD [facility]  03/29/2025 10:30 AM Lawerence Pressman, MD [facility] None   Daron Ellen, PharmD Clinical Pharmacist [facility] [phone]

## 2024-05-12 ENCOUNTER — Telehealth: Payer: Self-pay | Admitting: Family Medicine

## 2024-05-12 NOTE — Telephone Encounter (Signed)
 err

## 2024-05-13 ENCOUNTER — Other Ambulatory Visit

## 2024-05-13 DIAGNOSIS — E118 Type 2 diabetes mellitus with unspecified complications: Secondary | ICD-10-CM

## 2024-05-13 DIAGNOSIS — Z794 Long term (current) use of insulin: Secondary | ICD-10-CM

## 2024-05-13 DIAGNOSIS — Z7984 Long term (current) use of oral hypoglycemic drugs: Secondary | ICD-10-CM

## 2024-05-13 NOTE — Progress Notes (Cosign Needed)
 Brief Telephone Documentation Reason for Call: Insulin  monitoring  Summary of recent change: 6/10:  ?? Tresiba  10% - 50 to 45 units daily 2/2 fasting hypoglycemia. Continue Humalog  5 units once daily AC largest meal Has received new Libre 3 reader and sensors. Plans to set up by end of week (6/13)  Reported Regimen:  Tresiba  45 units once daily  Humalog  5 units twice daily (AC breakfast/dinner) Metformin  XR 1000 mg twice daily   SMBG: Libre 3+ (uses reader) Time in target (Last 7 Days): Above 25%; In target 71%; Below 4% Average glucose (Last 7 Days): 147 mg/dL Average glucose by time of day:     12 am - 6 am: 134       6 am - 12 pm: 152      12 pm - 6 pm: 167      6 pm - 12 pm: 138  Low Events (last 14 days): 4 total (2 in the morning, 2 in the evening which patient relates to prolonged fast - went to church did not have lunch, late dinner)  Assessment/Plan: Previously not checking sugars due to running out of [place] sensors. Sensor placed 1 week ago and shows good glycemic control. ABG 147 mg/dL correlates to estimated A1c 6.7%. Fasting/overnight sugars on average 130s, slightly above goal though many fasting readings in the 90s. No basal titration today per reported FBG in the 70s-80s. Continue to monitor. Going out of town for vacation next week. Brief f/u the week after.  Continue Tresiba  45 units once daily  Continue Humalog  5 units BIDAC (breakfast/dinner)  Follow Up: Phone check in ~1 week with PharmD  Patient given direct line for further questions/concerns while away on vacation.    Future Appointments  Date Time Provider Department Center  05/23/2024  9:00 AM [facility] PHARMACIST [facility]  08/01/2024  9:00 AM Jeffie Mingle, MD [facility]  08/03/2024  9:30 AM [facility] ANNUAL WELLNESS VISIT [facility]  12/19/2024  1:00 PM Jacklin Mascot, MD [facility]  03/29/2025 10:30 AM Lawerence Pressman, MD [facility] None   Daron Ellen, PharmD Clinical  Pharmacist [facility] [phone]

## 2024-05-23 ENCOUNTER — Other Ambulatory Visit (INDEPENDENT_AMBULATORY_CARE_PROVIDER_SITE_OTHER): Admitting: Pharmacist

## 2024-05-23 DIAGNOSIS — Z794 Long term (current) use of insulin: Secondary | ICD-10-CM

## 2024-05-23 DIAGNOSIS — E1165 Type 2 diabetes mellitus with hyperglycemia: Secondary | ICD-10-CM

## 2024-05-23 NOTE — Progress Notes (Signed)
 Brief Telephone Documentation Reason for Call: Insulin  monitoring  Summary of recent change: 6/10:  ?? Tresiba  10% - 50 to 45 units daily 2/2 fasting hypoglycemia Received new Libre 3 reader and sensors (replaces [place] 2).  Reported Regimen:  Tresiba  45 units once daily  Humalog  5 units twice daily (AC breakfast/dinner) Metformin  XR 1000 mg twice daily   SMBG: Libre 3+ (uses reader) Time in target (Last 7 Days): In target 70% Average glucose (Last 7 Days): 157 mg/dL Average glucose by time of day:     12 am - 6 am: 139       6 am - 12 pm: 153      12 pm - 6 pm: 192      6 pm - 12 pm: 148  Low Events (last 14 days): 2 total (between 6 am and 12 pm - cannot recall if before or after breakfast)  Assessment/Plan: Previously not checking sugars due to running out of [place] sensors. Sensor placed 2 weeks ago, shows fair good glycemic control, ABG 157 g/dL (J8r ~2.8%). Slightly higher than last though has been on vacation at the beach for the past week. FBG on average slightly above goal, though will occasional lows noted on [place] report. No basal titration today. Avg afternoon BG 192 mg/dL, suspect possible need for prandial titration, though PPBG patterns unclear.  Patient to start keeping note of BG readings ~2 hours after breakfast and dinner over the next week Continue Tresiba  45 units once daily  Continue Humalog  5 units BIDAC (breakfast/dinner)  Follow Up: Phone check in ~1 week with PharmD  Patient given direct line for further questions/concerns while away on vacation.    Future Appointments  Date Time Provider Department Center  05/31/2024  9:00 AM [facility] PHARMACIST [facility]  08/01/2024  9:00 AM Onesimo Claude, MD [facility]  08/03/2024  9:30 AM [facility] ANNUAL WELLNESS VISIT [facility]  12/19/2024  1:00 PM Abbey Bruckner, MD [facility]  03/29/2025 10:30 AM Francisca Redell BROCKS, MD [facility] None   Manuelita FABIENE Kobs, PharmD Clinical Pharmacist [facility] [phone]

## 2024-05-31 ENCOUNTER — Other Ambulatory Visit (INDEPENDENT_AMBULATORY_CARE_PROVIDER_SITE_OTHER): Admitting: Pharmacist

## 2024-05-31 DIAGNOSIS — E118 Type 2 diabetes mellitus with unspecified complications: Secondary | ICD-10-CM

## 2024-05-31 NOTE — Progress Notes (Signed)
 Brief Telephone Documentation Reason for Call: Insulin  monitoring  Care Team:  PCP. Dr. Glenys Ferrari ? [facility] [place]  Summary of recent change: 6/10:  ?? Tresiba  10% - 50 to 45 units daily 2/2 fasting hypoglycemia Received new Libre 3 reader and sensors (replaces [place] 2).   Since Last Visit: Patient reports he was on vacation for a week, states sugars have been out of control. Feels his main issue is the weekends. Notes he leaves house in the morning, not home until bedtime. Difficult to use dinner-time Humalog  dose on these days and will therefore use the dose later in the evening on weekends resulting in low alarms.   Over the past week, has been taking mental note of 2hPP BG readings:    2 hours after breakfast:143-180. This morning 111 mg/dL.     2 hours after dinner: often 180s-220 mg/dL.  Reported Regimen:  Tresiba  45 units once daily  Humalog  5 units twice daily (AC breakfast/dinner) Metformin  XR 1000 mg twice daily    SMBG: Libre 3+ (uses reader)  Last 7 Days (7/1 - 7/8) Time in target (Last 7 Days): In target 65%, Above target 31%, Below target 4 Average glucose by time of day:     12 am - 6 am: 126       6 am - 12 pm: 148      12 pm - 6 pm: 178      6 pm - 12 am: 182 Last 7 days: 5 events (2 events 12 am-6am, 3 events 6pm-12am).  Lowest in the 60s. Reports using Humalog  late at night after getting home (forgot dose before dinner)   Previous 7 days (6/24 - 7/1) Time in target (Last 7 Days): In target 70% Average glucose (Last 7 Days): 157 mg/dL Average glucose by time of day:     12 am - 6 am: 139       6 am - 12 pm: 153      12 pm - 6 pm: 192      6 pm - 12 am: 148 Low Events (last 7 days): 2 total (between 6 am and 12 pm - cannot recall if before or after breakfast)   Assessment/Plan: Although TIR worsened over the past week, overall averages through the day show good improvement with fasting BG at goal <130 mg/dL. Average sugar has improved all times of the  day with the exception of after evening meal as expected 2/2 missed Humalog  doses. 2hPPBG at goal s/p breakfast indicating 5 unit dose is sufficient. Discussed goal of Humalog  use more consistently prior to evening meal. Patient is open to bringing Humalog  pen with him on weekends in a cooler.   Continue Tresiba  45 units once daily  Continue Humalog  5 units BIDAC (breakfast/dinner) Goal to use Humalog  prior to every dinner meal.  Patient to start keeping note of BG readings ~2 hours after dinner over the next week Stop using Humalog  in the evening if dinner dose missed (hypoglycemia)   Correction factor (CF): 1800/TDD = ??32 mg/dL per 1 unit of insulin    If dinner dose of Humalog  is missed, do not give the dose later in the evening.  May use 2 units in the evening only if BG >200 mg/dL after missing evening Humalog  dose.     Follow Up: Phone check in ~2 weeks with PharmD  Patient given direct line for further questions/concerns while away on vacation.    Future Appointments  Date Time Provider Department Center  06/14/2024  9:00 AM [facility] PHARMACIST [facility]  08/01/2024  9:00 AM Onesimo Claude, MD [facility]  08/03/2024  9:30 AM [facility] ANNUAL WELLNESS VISIT [facility]  12/19/2024  1:00 PM Abbey Bruckner, MD [facility]  03/29/2025 10:30 AM Francisca Redell BROCKS, MD [facility] None   Manuelita FABIENE Kobs, PharmD Clinical Pharmacist [facility] [phone]

## 2024-05-31 NOTE — Patient Instructions (Signed)
 [name],   It was a pleasure to speak with you today! As we discussed:?   Continue Tresiba  45 units once daily  Continue Humalog  5 units twice daily - Fifteen minutes prior to breakfast and dinner If going out for dinner/traveling, we discussed keeping Humalog  in a small insulated cooler with an ice pack.  Pay attention to your blood sugar readings two hours after finishing dinner If you miss a Humalog  dose before dinner, avoid taking the dose later in the evening as this will cause low blood sugars.   We would rather your sugars be slightly high for a night than be too low. If you do miss your dinner dose, you may skip the Humalog  altogether for that evening.  If you are concerned that your sugar is still very high when you get home, you may use 2 units of Humalog  only if your blood sugar is still above 200 mg/dL. It is estimated that 2 units will drop your sugar by 64 points. (If 200, the two units should get your sugar back to 140s).    Please reach out prior to your next scheduled appointment should you have any questions or concerns.   Thank you!   Future Appointments  Date Time Provider Department Center  06/14/2024  9:00 AM [facility] PHARMACIST [facility]  08/01/2024  9:00 AM Onesimo Claude, MD [facility]  08/03/2024  9:30 AM [facility] ANNUAL WELLNESS VISIT [facility]  12/19/2024  1:00 PM Abbey Bruckner, MD [facility]  03/29/2025 10:30 AM Francisca Redell BROCKS, MD [facility] None    [name], PharmD Clinical Pharmacist [facility] [phone]

## 2024-06-13 DIAGNOSIS — M7581 Other shoulder lesions, right shoulder: Secondary | ICD-10-CM | POA: Diagnosis not present

## 2024-06-13 DIAGNOSIS — M19011 Primary osteoarthritis, right shoulder: Secondary | ICD-10-CM | POA: Diagnosis not present

## 2024-06-13 NOTE — Progress Notes (Signed)
 HPI:  [name] is a 78 y.o. male who presents for follow-up of his right shoulder pain secondary to impingement/tendinopathy with early degenerative joint disease.  The patient was last seen for these symptoms 2 months ago.  At this visit, he received a steroid injection into the right subacromial space which he states provided substantial relief of his symptoms, although he notes that the shot took a few weeks to begin to work.  On today's visit, he denies any pain in the shoulder and has not been taking any medications for shoulder discomfort at this time.  He has resumed all of his normal daily activities without difficulty.  Although he is not sleeping well at night, it is not because of his shoulder.  He denies any reinjury to the shoulder, and denies any fevers or chills.  Current Outpatient Medications  Medication Sig Dispense Refill  . albuterol  90 mcg/actuation inhaler Inhale into the lungs    . amLODIPine  (NORVASC ) 2.5 MG tablet Take 1 tablet by mouth once daily    . aspirin 81 MG EC tablet Take 81 mg by mouth once daily    . atorvastatin  (LIPITOR) 20 MG tablet Take 1 tablet (20 mg total) by mouth once daily 30 tablet 1  . azelastine  (ASTELIN ) 137 mcg nasal spray Place into both nostrils continuously as needed    . blood glucose diagnostic test strip 1 each by Other route 2 (two) times daily. And lancets 2/day 250.43    . cholecalciferol (VITAMIN D3) 1000 unit tablet Take by mouth once daily    . cyanocobalamin  (VITAMIN B12) 1,000 mcg/mL injection INJECT 1 ML (1,000 MCG TOTAL) INTO THE MUSCLE EVERY 30 (THIRTY) DAYS.    . diclofenac  (VOLTAREN ) 1 % topical gel Apply 2 g topically as needed    . dutasteride  (AVODART ) 0.5 mg capsule Take 0.5 mg by mouth once daily    . empagliflozin  (JARDIANCE ) 10 mg tablet Take 10 mg by mouth every morning before breakfast    . fluocinolone  acetonide (DERMOTIC ) 0.01 % otic drop PLACE 5 DROPS IN EAR(S) 2 (TWO) TIMES DAILY AS NEEDED. X1  2 WEEKS    .  fluticasone  propionate (FLONASE ) 50 mcg/actuation nasal spray Place into one nostril    . lancets Use as instructed 2x daily Dx: 250.00    . losartan  (COZAAR ) 50 MG tablet Take 50 mg by mouth 2 (two) times daily    . magnesium  oxide (MAG-OX) 400 mg (241.3 mg magnesium ) tablet Take 400 mg by mouth once daily    . metFORMIN  (GLUCOPHAGE -XR) 500 MG XR tablet Take 2 tablets by mouth 2 (two) times daily    . NON FORMULARY Prostate PeantusOnce daily    . OZEMPIC  0.25 mg or 0.5 mg (2 mg/3 mL) pen injector Inject 0.5 mg subcutaneously once a week    . pen needle, diabetic 31 gauge x 3/16 needle Use as directed 2 X daily. BD ULTRAPFINE MINI. Dx: 250.00    . POTASSIUM/MAGNESIUM  (MAGNESIUM -POTASSIUM ORAL) Take 1 tablet by mouth once daily      . predniSONE (DELTASONE) 20 MG tablet Take 1 tablet twice daily for 5 days, then 1 tablet once daily for 5 days.  Take with food. (Patient not taking: Reported on 04/29/2024) 15 tablet 0  . SODIUM CHLORIDE  0.65 % nasal spray     . tadalafiL  (CIALIS ) 10 MG tablet Take 10-20 mg by mouth    . tamsulosin  (FLOMAX ) 0.4 mg capsule Take by mouth    . tiZANidine (ZANAFLEX) 4  MG tablet Take 1 tablet (4 mg total) by mouth 3 (three) times daily 90 tablet 11  . TRESIBA  FLEXTOUCH U-200 pen injector (concentration 200 units/mL) Inject 78 Units subcutaneously once daily    . vitamin E 400 UNIT capsule Take 400 Units by mouth once daily       No current facility-administered medications for this visit.   Allergies  Allergen Reactions  . Sulfamethoxazole-Trimethoprim Rash    REACTION: Rash   Past Medical History:  Diagnosis Date  . Adenomatous polyp of colon   . Allergic rhinitis 08/03/2007  . Anemia   . Asthma (HHS-HCC)   . BPH (benign prostatic hyperplasia) 08/03/2007  . Chronic back pain   . COPD (chronic obstructive pulmonary disease) (CMS/HHS-HCC)   . Diabetes mellitus type 2, uncomplicated (CMS/HHS-HCC)   . Diabetic retinopathy, background (CMS/HHS-HCC) 09/29/2008  .  Esophageal stricture 12/28/2008  . GERD (gastroesophageal reflux disease) 11/28/2008  . Hyperlipidemia 08/03/2007   Patient reported high cholesterol  . Hypertension 01/07/2008  . Kidney stones   . 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 Urinary incontinence    Past Surgical History:  Procedure Laterality Date  . CHOLECYSTECTOMY  11/12/2016  . urinary surgery  2018  . COLONOSCOPY  2019  . hand surgery Right   . kidney stones    . LITHOTRIPSY    . shoulder surgery Left     Family History  Problem Relation Name Age of Onset  . Diabetes Mother    . Depression Mother    . Diabetes Brother    . Stroke Brother    . High blood pressure (Hypertension) Brother    . Heart disease Brother    . Prostate cancer Brother    . Diabetes Brother    . Colon cancer Neg Hx      Social History   Socioeconomic History  . Marital status: Married  Tobacco Use  . Smoking status: Never  . Smokeless tobacco: Never  Vaping Use  . Vaping status: Never Used  Substance and Sexual Activity  . Alcohol use: Yes    Alcohol/week: 0.0 standard drinks of alcohol    Comment: occassionally  . Drug use: Never  . Sexual activity: Defer   Social Drivers of Health   Financial Resource Strain: Low Risk  (01/13/2024)   Received from [facility]   Overall Financial Resource Strain (CARDIA)   . Difficulty of Paying Living Expenses: Not hard at all  Food Insecurity: No Food Insecurity (01/13/2024)   Received from [facility]   Hunger Vital Sign   . Within the past 12 months, you worried that your food would run out before you got the money to buy more.: Never true   . Within the past 12 months, the food you bought just didn't last and you didn't have money to get more.: Never true  Transportation Needs: No Transportation Needs (01/13/2024)   Received from [facility] - Transportation   . Lack of Transportation (Medical): No   . Lack of Transportation (Non-Medical): No    Review of Systems:   A comprehensive 14 point ROS was performed, reviewed, and the pertinent orthopaedic findings are documented in the HPI.  Exam: Vitals:   06/13/24 0804  BP: 130/70  Weight: 86 kg (189 lb 9.6 oz)  Height: 182.9 cm (6')  PainSc: 0-No pain  PainLoc: Shoulder   General/Constitutional: The patient appears to be well-nourished, well-developed, and in no acute distress. Neuro/Psych: Normal mood and affect, oriented  to person, place and time.  Right shoulder exam: SKIN: Normal SWELLING: None WARMTH: None LYMPH NODES: No adenopathy palpable CREPITUS: None TENDERNESS: Mildly tender along lateral acromion ROM (active):      Forward flexion: 140 degrees    Abduction: 135 degrees    Internal rotation: Right PSIS ROM (passive):      Forward flexion: 150 degrees    Abduction: 145 degrees    ER/IR at 85 abd: 85 degrees/50 degrees   He denies any pain with range of motion.   STRENGTH:   Forward flexion: 4-4+/5                         Abduction: 4-4+/5                         External rotation: 4-4+/5                         Internal rotation: 4+/5                         Pain with RC testing: None   STABILITY: Normal   SPECIAL TESTS:       Vonzell' test: Negative                                     Speed's test: Negative                                     Capsulitis - pain w/ passive ER: None                                     Crossed arm test: Negative                                     Crank: Not evaluated                                     Anterior apprehension: Negative                                     Posterior apprehension: Not evaluated  He is neurovascularly intact to the right upper extremity and hand.  Assessment: Encounter Diagnoses  Name Primary?  . Rotator cuff tendinitis, right Yes  . Primary osteoarthritis of right shoulder   . DJD of right AC (acromioclavicular) joint     Plan: The treatment options were discussed with the patient.  In addition,  patient educational materials were provided regarding the diagnosis and treatment options.  Overall, the patient is pleased with his symptomatic and functional improvement at this time.  Therefore, I have recommended that he continue with his normal daily activities and home exercises, but to avoid offending activities.  He may take over-the-counter medications as needed for discomfort.  All of the patient's questions and concerns were answered.  He can call any time with further concerns.  He  will follow up on an as necessary basis per his request.

## 2024-06-14 ENCOUNTER — Other Ambulatory Visit

## 2024-06-14 NOTE — Progress Notes (Deleted)
 Brief Telephone Documentation Reason for Call: Insulin  monitoring  Care Team:  PCP. Dr. Glenys Ferrari ? [facility]  Summary of recent change: 7/8: Start using Humalog  consistently prior to dinner. Skip if dinner is skipped.  6/10:  ?? Tresiba  10% - 50 to 45 units daily 2/2 fasting hypoglycemia Received new Libre 3 reader and sensors (replaces [place] 2).   Since Last Visit: Patient reports he was on vacation for a week, states sugars have been out of control. Feels his main issue is the weekends. Notes he leaves house in the morning, not home until bedtime. Difficult to use dinner-time Humalog  dose on these days and will therefore use the dose later in the evening on weekends resulting in low alarms.   Over the past week, has been taking mental note of 2hPP BG readings:    2 hours after breakfast:143-180. This morning 111 mg/dL.     2 hours after dinner: often 180s-220 mg/dL.  Reported Regimen:  Tresiba  45 units once daily  Humalog  5 units twice daily (AC breakfast/dinner) Metformin  XR 1000 mg twice daily    SMBG: Libre 3+ (uses reader)  Last 7 Days (7/1 - 7/8) Time in target (Last 7 Days): In target 65%, Above target 31%, Below target 4 Average glucose by time of day:     12 am - 6 am: 126       6 am - 12 pm: 148      12 pm - 6 pm: 178      6 pm - 12 am: 182 Last 7 days: 5 events (2 events 12 am-6am, 3 events 6pm-12am).  Lowest in the 60s. Reports using Humalog  late at night after getting home (forgot dose before dinner)   Previous 7 days (6/24 - 7/1) Time in target (Last 7 Days): In target 70% Average glucose (Last 7 Days): 157 mg/dL Average glucose by time of day:     12 am - 6 am: 139       6 am - 12 pm: 153      12 pm - 6 pm: 192      6 pm - 12 am: 148 Low Events (last 7 days): 2 total (between 6 am and 12 pm - cannot recall if before or after breakfast)   Assessment/Plan: Although TIR worsened over the past week, overall averages through the day show good  improvement with fasting BG at goal <130 mg/dL. Average sugar has improved all times of the day with the exception of after evening meal as expected 2/2 missed Humalog  doses. 2hPPBG at goal s/p breakfast indicating 5 unit dose is sufficient. Discussed goal of Humalog  use more consistently prior to evening meal. Patient is open to bringing Humalog  pen with him on weekends in a cooler.   Continue Tresiba  45 units once daily  Continue Humalog  5 units BIDAC (breakfast/dinner) Goal to use Humalog  prior to every dinner meal.  Patient to start keeping note of BG readings ~2 hours after dinner over the next week Stop using Humalog  in the evening if dinner dose missed (hypoglycemia)   Correction factor (CF): 1800/TDD = ??32 mg/dL per 1 unit of insulin    If dinner dose of Humalog  is missed, do not give the dose later in the evening.  May use 2 units in the evening only if BG >200 mg/dL after missing evening Humalog  dose.     Follow Up: Phone check in ~2 weeks with PharmD  Patient given direct line for further questions/concerns while away  on vacation.    Future Appointments  Date Time Provider Department Center  06/14/2024  9:00 AM [facility] PHARMACIST [facility]  08/01/2024  9:00 AM Onesimo Claude, MD [facility]  08/03/2024  9:30 AM [facility] ANNUAL WELLNESS VISIT [facility]  12/19/2024  1:00 PM Abbey Bruckner, MD [facility]  03/29/2025 10:30 AM Francisca Redell BROCKS, MD [facility] None   Manuelita FABIENE Kobs, PharmD Clinical Pharmacist [facility] [phone]

## 2024-06-15 ENCOUNTER — Telehealth: Payer: Self-pay

## 2024-06-15 NOTE — Telephone Encounter (Signed)
 Copied from CRM #[id number]. Topic: General - Call Back - No Documentation >> Jun 15, 2024  2:06 PM [name] wrote: Reason for CRM: Patient is returning a missed call from the clinic, no documentation of call. Please call back if needed.

## 2024-06-16 ENCOUNTER — Other Ambulatory Visit: Admitting: Pharmacist

## 2024-06-16 DIAGNOSIS — E1159 Type 2 diabetes mellitus with other circulatory complications: Secondary | ICD-10-CM

## 2024-06-16 DIAGNOSIS — E118 Type 2 diabetes mellitus with unspecified complications: Secondary | ICD-10-CM

## 2024-06-16 NOTE — Progress Notes (Signed)
 Brief Telephone Documentation Reason for Call: Insulin  monitoring/chronic disease state management  Chief Complaint  Patient presents with   Diabetes   Hypertension    Reason for visit: ?  [name] is a 78 y.o. male with a history of diabetes (type 2), who presents today for a follow up diabetes pharmacotherapy visit surrounding his chronic conditions (DM2, HTN, HLD).? Pertinent PMH includes DM2, HTN, CAD, Asthma/emphysema, GERD, chronic constipation, FLD, cholecystitis, HLD, CKD, hx kidney stones.   Care Team: Primary Care Provider: Hope Merle, MD ? [facility] (future). Next visit 08/01/24 w Dr. Narendra.  Provider of the day: Arnett   Known DM Complications: retinopathy, HTN, CAD, Diabetic Kidney Disease with microalbuminuria, hx retinopathy documented  Care Team:  PCP. Dr. Merle Hope ? [facility] Provider of the Day: Arnett  Summary of recent change: 7/8: Start using Humalog  consistently prior to dinner. Skip if dinner is skipped.  6/10:  ?? Tresiba  10% - 50 to 45 units daily 2/2 fasting hypoglycemia Received new Libre 3 reader and sensors (replaces [place] 2).   Since Last Visit:  #Diabetes Patient reports he was on vacation for another week, down at the beach.  Reports one night had a light dinner (salad) and still used his Humalog . Used glucose tablets as to prevent lower sugars at bedtime.    Reports seeing podiatrist who considered removing his toenail that has been giving him trouble though notes he will not perform the procedure unless A1c is 6.5% or below. Patient has been considering this more recently and notes he is more open to proceeding with the procedure.   Reported Regimen:  Tresiba  45 units once daily  Humalog  5 units twice daily (AC breakfast/dinner) Metformin  XR 1000 mg twice daily   SMBG: Libre 3+ (uses reader) 113 this morning. No low alarms in the past 2 weeks. Notes average BG slightly higher related to traveling/vacationing. Starting to normalize  since returning home on Sunday.    #Hypertension Has been checking more blood pressures lately at home. Notes running lower than usual, often in the 90s systolic. Reports he will skip his losartan  AM/ and PM dose if morning systolic <100 (e.g 98/70 mmHg). No symptoms when SBP <100, though notices less energy when working out in the yard. Highest 145/70 mg/dL likely secondary to skipping multiple doses of losartan .   Reported Regimen:  Losartan  50 mg twice daily (skipping doses frequently due to SBP <100)   Assessment/Plan:  Diabetes: Recent vacation at the beach has resulted in slightly higher average sugars per patient report. He notes the week prior to vacation, sugars were the best they have been with TIR >80%. Returned home this past Sunday and plans to get back on track with usual diet/activity.  Continue Tresiba  45 units once daily  Continue Humalog  5 units BIDAC (breakfast/dinner) Okay to skip Humalog  if having light/low-carb dinner such as a salad.  Patient to start keeping note of BG readings ~2 hours after breakfast/dinner intermittently over the next 2 week Correction factor (CF): 1800/TDD = ??32 mg/dL per 1 unit of insulin    If dinner dose of Humalog  is missed, do not give the dose later in the evening.  May use 2 units in the evening only if BG >200 mg/dL after missing evening Humalog  dose.     Hypertension: Variable compliance with losartan  50 mg BID per regular systolic's in the upper 90s, possibly 2/2 significant dietary improvements per diabetes goals. Regularly skipping both daily doses, sometimes multiple days in a row.  No dizziness or lightheadedness when systolic's <100. No falls.  Notes slightly lower energy when pressures are on the lower end which is not ideal as he frequently works in the yard.  Rather than skipping multiple doses of losartan , patient agreeable to remain consistent with nighttime dosing of 50 mg over the next week with close BP monitoring daily (AM  checks, intermittent afternoon or evening checks. Check if any symptoms of dizziness, lightheadedness, low energy, headache, vision change). Agreeable to recording BP numbers in home log.  Focus on regular compliance with losartan .  Reduce losartan  50 mg BID ? 50 mg at bedtime for the next 2 weeks rather than skipping multiple doses.  Record daily morning/evening BP in home log. Review w PharmD 2 weeks.    Follow Up: Phone check in 2 weeks with PharmD  Patient given direct line for further questions/concerns while away on vacation.    Future Appointments  Date Time Provider Department Center  06/28/2024  9:00 AM [facility]  08/01/2024  9:00 AM Joshua Claude, MD [facility]  08/03/2024  9:30 AM [facility] ANNUAL WELLNESS VISIT [facility]  12/19/2024  1:00 PM Abbey Bruckner, MD [facility]  03/29/2025 10:30 AM Francisca Redell BROCKS, MD [facility] None   Manuelita FABIENE Kobs, PharmD Clinical Pharmacist [facility] [phone]

## 2024-06-28 ENCOUNTER — Other Ambulatory Visit: Admitting: Pharmacist

## 2024-06-28 DIAGNOSIS — E118 Type 2 diabetes mellitus with unspecified complications: Secondary | ICD-10-CM

## 2024-06-28 DIAGNOSIS — E1159 Type 2 diabetes mellitus with other circulatory complications: Secondary | ICD-10-CM

## 2024-06-28 NOTE — Progress Notes (Signed)
 Brief Telephone Documentation Reason for Call: Insulin  monitoring/chronic disease state management  Chief Complaint  Patient presents with   Diabetes   Reason for visit: ?  [name] is a 78 y.o. male with a history of diabetes (type 2), who presents today for a follow up diabetes pharmacotherapy visit surrounding his chronic conditions (DM2, HTN, HLD).? Pertinent PMH includes DM2, HTN, CAD, Asthma/emphysema, GERD, chronic constipation, FLD, cholecystitis, HLD, CKD, hx kidney stones.   Care Team: Primary Care Provider: Hope Merle, MD ? [facility] (future). Next visit 08/01/24 w Dr. Narendra.  Provider of the day: Arnett   Known DM Complications: retinopathy, HTN, CAD, Diabetic Kidney Disease with microalbuminuria, hx retinopathy documented  Care Team:  PCP. Dr. Merle Hope ? [facility] Provider of the Day: [place]  Summary of recent change: 7/24: Reduce losartan  50 mg BID ? 50 mg at bedtime for the next 2 weeks rather than skipping multiple doses.  7/8: Start using Humalog  consistently prior to dinner. Skip if dinner is skipped.  6/10:  ?? Tresiba  10% - 50 to 45 units daily 2/2 fasting hypoglycemia Received new Libre 3 reader and sensors (replaces [place] 2).   Since Last Visit:  #Diabetes Since last visit, reports doing well overall. Feels his sugars have not been well-controlled (though he tends to set his own goals for Target Range >80%).    Reports seeing podiatrist who considered removing his toenail that has been giving him trouble though notes he will not perform the procedure unless A1c is 6.5% or below. Patient has been considering this more recently and notes he is more open to proceeding with the procedure.   Reported Regimen:  Tresiba  45 units once daily  Humalog  5 units twice daily (AC breakfast/dinner) Metformin  XR 1000 mg twice daily   SMBG: Libre 3+ (uses reader) Time in target (Last 7 Days): Above 21%; In target 77%; Below 2% Average glucose (Last 7 Days): 139  mg/dL Average glucose by time of day:     12 am - 6 am: 124       6 am - 12 pm: 147      12 pm - 6 pm: 158      6 pm - 12 am: 131   Low glucose events: 4 total (2 between 12am-6am, 12pm-5pm, 1 between 6pm-12am)   #Hypertension Has been checking more blood pressures at home. Notes running higher than previous. Only one systolic reading <899. Denies s/sx dizziness/lightheadedness except when working outside in the yard. Tries to stay relatively hydrated though admits he could do better.   8/5 120/66 8/4 109/61 8/3 105/58 8/2 117/61 8/1 90/88 7/31 112/66  Reported Regimen:  Losartan  50 mg once daily (evening)   Objective  BP Readings from Last 3 Encounters:  04/20/24 124/66  03/29/24 (!) 158/73  02/04/24 (!) 126/58    Lab Results  Component Value Date   HGBA1C 8.5 (A) 04/20/2024   HGBA1C 7.8 (A) 01/21/2024   HGBA1C 8.4 (A) 10/13/2023   GLUCOSE 202 (H) 01/21/2024   GLUCOSE 161 (H) 08/14/2023   GLUCOSE 370 (H) 05/26/2023   MICRALBCREAT 19 05/29/2020   MICRALBCREAT 40.4 (H) 11/07/2009   MICRALBCREAT 117.5 (H) 09/14/2007   CREATININE 1.07 01/21/2024   CREATININE 1.08 08/14/2023   CREATININE 0.96 05/26/2023   GFR 67.80 01/21/2024   GFR 67.25 08/14/2023   GFR 77.58 05/26/2023    Lab Results  Component Value Date   CHOL 144 05/26/2023   LDLDIRECT 46.0 08/31/2018   AST 18  01/21/2024   ALT 19 01/21/2024      Chemistry      Component Value Date/Time   NA 138 01/21/2024 0859   K 4.3 01/21/2024 0859   CL 103 01/21/2024 0859   CO2 29 01/21/2024 0859   BUN 15 01/21/2024 0859   CREATININE 1.07 01/21/2024 0859      Component Value Date/Time   CALCIUM  9.6 01/21/2024 0859   ALKPHOS 55 01/21/2024 0859   AST 18 01/21/2024 0859   ALT 19 01/21/2024 0859   BILITOT 1.0 01/21/2024 0859      Assessment/Plan:  #Diabetes: Recent vacation at the beach has resulted in slightly higher average sugars per patient report which have since returned to usual baseline. Per CGM  today, excellent glycemic control.  Continue Tresiba  45 units once daily  Change Humalog  5 units AC breakfast only. Stop using with dinner meal to reduce risk of hypoglycemia   #Hypertension: Hypotension improved with consistency of losartan  50 mg once daily rather than twice daily. No readings in the past week above goal. No longer seeing frequent systolics <100.  Check if any symptoms of dizziness, lightheadedness, low energy, headache, vision change). Agreeable to recording BP numbers in home log.  Focus on regular compliance with losartan .  Continue losartan  50 mg daily  Record daily morning/evening BP in home log. Review w PharmD 2 weeks.    Follow Up: Herlene review 2-3 weeks Patient given direct line for further questions/concerns while away on vacation.    Future Appointments  Date Time Provider Department Center  07/12/2024  9:00 AM [facility] PHARMACIST [facility]  08/01/2024  9:00 AM Onesimo Claude, MD [facility]  08/03/2024  9:30 AM [facility] ANNUAL WELLNESS VISIT [facility]  12/19/2024  1:00 PM Abbey Bruckner, MD [facility]  03/29/2025 10:30 AM Francisca Redell BROCKS, MD [facility] None   Manuelita FABIENE Kobs, PharmD Clinical Pharmacist [facility] [phone]

## 2024-06-28 NOTE — Patient Instructions (Signed)
 Mr. MARINA DESIRE,   It was a pleasure to speak with you today! As we discussed:?   Continue Tresiba  as you have been taking it Continue Humalog  5 units with breakfast Stop using Humalog  with evening meal.    Please reach out prior to your next scheduled appointment should you have any questions or concerns.  Thank you!   Future Appointments  Date Time Provider Department Center  07/12/2024  9:00 AM [facility] PHARMACIST [facility]  08/01/2024  9:00 AM Onesimo Claude, MD [facility]  08/03/2024  9:30 AM [facility] ANNUAL WELLNESS VISIT [facility]  12/19/2024  1:00 PM Abbey Bruckner, MD [facility]  03/29/2025 10:30 AM Francisca Redell BROCKS, MD [facility] None    Manuelita FABIENE Kobs, PharmD Clinical Pharmacist [facility] [phone]

## 2024-07-12 ENCOUNTER — Other Ambulatory Visit (INDEPENDENT_AMBULATORY_CARE_PROVIDER_SITE_OTHER): Admitting: Pharmacist

## 2024-07-12 DIAGNOSIS — E118 Type 2 diabetes mellitus with unspecified complications: Secondary | ICD-10-CM

## 2024-07-12 DIAGNOSIS — E1159 Type 2 diabetes mellitus with other circulatory complications: Secondary | ICD-10-CM

## 2024-07-12 NOTE — Progress Notes (Signed)
 Brief Telephone Documentation Reason for Call: Insulin  monitoring/chronic disease state management  Chief Complaint  Patient presents with   Diabetes   Hypertension   Reason for visit: ?  [name] is a 78 y.o. male with a history of diabetes (type 2), who presents today for a follow up diabetes pharmacotherapy visit surrounding his chronic conditions (DM2, HTN, HLD).? Pertinent PMH includes DM2, HTN, CAD, Asthma/emphysema, GERD, chronic constipation, FLD, cholecystitis, HLD, CKD, hx kidney stones.   Care Team: Primary Care Provider: Hope Merle, MD ? [facility] (future). Next visit 08/01/24 w Dr. Narendra.  Provider of the day: Scott   Known DM Complications: retinopathy, HTN, CAD, Diabetic Kidney Disease with microalbuminuria, hx retinopathy documented  Care Team:  PCP. Dr. Merle Hope ? [facility] Provider of the Day: [place]  Summary of recent change: 8/5: SMBG very well controlled though continued significant freq post-dinner lows w FBG all WNL. Hold evening dose of Humalog   7/24: Reduce losartan  50 mg BID ? 50 mg at bedtime for the next 2 weeks rather than skipping multiple doses.  7/8: Start using Humalog  consistently prior to dinner. Skip if dinner is skipped.   6/10: ?? Tresiba  10% - 50 to 45 units daily 2/2 fasting hypoglycemia. Received new Libre 3 reader and sensors (replaces Ferguson 2).   Since Last Visit:  #Diabetes Since last visit, reports doing well overall. Feels his sugars have fair. Notes only one low which occurred in the afternoon. Had breakfast w insulin . Was out running errands, skipped lunch. Nothing to eat all day, noted low in the 50s.   Has stopped using Humalog  with dinner as instructed on most nights, though takes 5 units with meals he knows wilL spike his sugar (spaghetti).   Reported Regimen:  Tresiba  45 units once daily  Humalog  5 units before breakfast (has added in PRN 5 units w dinner) Metformin  XR 1000 mg twice daily   SMBG: Libre 3+ (uses  reader) Time in target (Last 7 Days): Above 25%; In target 74%; Below 1% Average glucose (Last 7 Days): 151 mg/dL Average glucose by time of day:     12 am - 6 am: 135       6 am - 12 pm: 134      12 pm - 6 pm: 157      6 pm - 12 am: 181   #Hypertension Has been checking more blood pressures at home (AM and PM). Takes BP medication in the evening.  No further symptoms of low blood pressures (symptoms tend to occur with systolics <100. He notes this is rare and usually 2/2 not eating/drinking all day).   SMBP: Mornings run 100s-110s/high 60s (highest 140s/69) Evenings run 105-122/69 (highest 134/70)  Reported Regimen:  Losartan  50 mg once daily (evening)   Objective  BP Readings from Last 3 Encounters:  04/20/24 124/66  03/29/24 (!) 158/73  02/04/24 (!) 126/58   Lab Results  Component Value Date   HGBA1C 8.5 (A) 04/20/2024   HGBA1C 7.8 (A) 01/21/2024   HGBA1C 8.4 (A) 10/13/2023   GLUCOSE 202 (H) 01/21/2024   GLUCOSE 161 (H) 08/14/2023   GLUCOSE 370 (H) 05/26/2023   MICRALBCREAT 19 05/29/2020   MICRALBCREAT 40.4 (H) 11/07/2009   MICRALBCREAT 117.5 (H) 09/14/2007   CREATININE 1.07 01/21/2024   CREATININE 1.08 08/14/2023   CREATININE 0.96 05/26/2023   GFR 67.80 01/21/2024   GFR 67.25 08/14/2023   GFR 77.58 05/26/2023   Lab Results  Component Value Date   CHOL  144 05/26/2023   LDLDIRECT 46.0 08/31/2018   AST 18 01/21/2024   ALT 19 01/21/2024     Chemistry      Component Value Date/Time   NA 138 01/21/2024 0859   K 4.3 01/21/2024 0859   CL 103 01/21/2024 0859   CO2 29 01/21/2024 0859   BUN 15 01/21/2024 0859   CREATININE 1.07 01/21/2024 0859      Component Value Date/Time   CALCIUM  9.6 01/21/2024 0859   ALKPHOS 55 01/21/2024 0859   AST 18 01/21/2024 0859   ALT 19 01/21/2024 0859   BILITOT 1.0 01/21/2024 0859      Assessment/Plan:  #Diabetes: Per CGM today, good glycemic control. Slightly higher than previous as expected, though infrequent lows.  Patient notes one low in the past week related to skipping lunch. No overnight lows. Evening sugars on average a bit higher than goal though resolved by morning with FBG hovering around goal range. Has continued using evening Humalog  only with high-carb meals (previously held d/t frequent overnight lows).  Okay to continue Humalog  daily w breakfast though will reduce to 4 units as he continues missing lunch meals resulting in lows in the 50s. 1% low on CGM suggests more than the single event reported. May resume evening Humalog  at reduced 4 unit dose only for higher-carb meals as he has been doing.  Current Regimen: MTF 100mg  BID, tresiba  45 u/d, Huamlog 5u AC breakfast (has been adding 5 units with heavy evening meals) Continue Tresiba  45 units once daily  Change Humalog  4 units AC breakfast, 4 units AC dinner only if having high-carb meal (ex spaghetti, out to eat, etc.) Future consideration: Cost is a barrier to brand-name medications (GLP1, SGLT2i, DPP4i)   #Hypertension: Hypotension improved with consistency of losartan  50 mg once daily rather than twice daily. No readings in the past week above goal. No longer seeing systolics <100 unless skipping meals.  Continue losartan  50 mg daily  Continue to monitor home pressures periodically + Check BP if any symptoms of dizziness, lightheadedness, low energy, headache, vision change.  Reviewed importance of not skipping meals and remaining well-hydrated, especially before traveling or starting any yard work.     Follow Up: Office visit scheduled 3 weeks v Dr. Onesimo PharmD follow up via phone ~3 weeks after office visit  Patient given direct line for further questions/concerns while away on vacation.    Future Appointments  Date Time Provider Department Center  08/01/2024  9:00 AM Onesimo Claude, MD [facility]  08/03/2024  9:30 AM [facility] ANNUAL WELLNESS VISIT [facility]  08/23/2024  9:00 AM [facility] PHARMACIST [facility]   12/19/2024  1:00 PM Abbey Bruckner, MD [facility]  03/29/2025 10:30 AM Francisca Redell BROCKS, MD [facility] None   Manuelita FABIENE Kobs, PharmD Clinical Pharmacist [facility] [phone]

## 2024-07-28 ENCOUNTER — Encounter: Payer: Self-pay | Admitting: Urology

## 2024-08-01 ENCOUNTER — Ambulatory Visit: Admitting: Internal Medicine

## 2024-08-03 ENCOUNTER — Ambulatory Visit (INDEPENDENT_AMBULATORY_CARE_PROVIDER_SITE_OTHER): Payer: Medicare Other | Admitting: *Deleted

## 2024-08-03 ENCOUNTER — Ambulatory Visit (INDEPENDENT_AMBULATORY_CARE_PROVIDER_SITE_OTHER): Admitting: Internal Medicine

## 2024-08-03 ENCOUNTER — Encounter: Payer: Self-pay | Admitting: Internal Medicine

## 2024-08-03 VITALS — BP 118/66 | HR 75 | Temp 97.8°F | Ht 72.0 in | Wt 190.8 lb

## 2024-08-03 VITALS — BP 125/75 | Ht 72.0 in | Wt 185.0 lb

## 2024-08-03 DIAGNOSIS — E1169 Type 2 diabetes mellitus with other specified complication: Secondary | ICD-10-CM | POA: Diagnosis not present

## 2024-08-03 DIAGNOSIS — E1159 Type 2 diabetes mellitus with other circulatory complications: Secondary | ICD-10-CM

## 2024-08-03 DIAGNOSIS — Z7984 Long term (current) use of oral hypoglycemic drugs: Secondary | ICD-10-CM

## 2024-08-03 DIAGNOSIS — E785 Hyperlipidemia, unspecified: Secondary | ICD-10-CM

## 2024-08-03 DIAGNOSIS — E118 Type 2 diabetes mellitus with unspecified complications: Secondary | ICD-10-CM

## 2024-08-03 DIAGNOSIS — B351 Tinea unguium: Secondary | ICD-10-CM

## 2024-08-03 DIAGNOSIS — I152 Hypertension secondary to endocrine disorders: Secondary | ICD-10-CM | POA: Diagnosis not present

## 2024-08-03 DIAGNOSIS — Z0000 Encounter for general adult medical examination without abnormal findings: Secondary | ICD-10-CM | POA: Diagnosis not present

## 2024-08-03 DIAGNOSIS — E11319 Type 2 diabetes mellitus with unspecified diabetic retinopathy without macular edema: Secondary | ICD-10-CM

## 2024-08-03 DIAGNOSIS — Z23 Encounter for immunization: Secondary | ICD-10-CM

## 2024-08-03 DIAGNOSIS — Z794 Long term (current) use of insulin: Secondary | ICD-10-CM

## 2024-08-03 NOTE — Assessment & Plan Note (Signed)
-   This problem is chronic and stable -Will recheck lipid panel today -Continue with Crestor  10 mg daily -No further workup at this time

## 2024-08-03 NOTE — Assessment & Plan Note (Signed)
-   Patient has a history of diabetic retinopathy and follows with [facility] -His last diabetic eye exam was on August 1 -I reiterated the importance of following up with his ophthalmologist and he will make an appointment to see them soon -No further workup at this time

## 2024-08-03 NOTE — Progress Notes (Signed)
 Acute Office Visit  Subjective:     Patient ID: [name], male    DOB: Jan 30, 1946, 78 y.o.   MRN: [id number]  Chief Complaint  Patient presents with   Medical Management of Chronic Issues    HPI Patient is in today for follow-up of his chronic medical issues.  Patient does have a history of diabetes and is currently on Tresiba  45 units daily and Humalog  4 units before breakfast and 4 units as needed before dinner (depending on the meal).  He is also on metformin  1000 mg twice a day.  He states that his blood sugars have improved and that his average blood sugar over the last 7 days was in the 150s.  However, he does still have some highs up to the 250s and an occasional low blood sugar (down to the low 50s a couple of days ago).  He has been working with our clinical pharmacist to help improve his blood sugars  Patient does have a history of hypertension but had episodes of low blood pressure.  He is currently on losartan  50 mg daily (was on 50 mg twice daily previously).  Patient states that since he changed the dosing of his medication his blood pressures have been better and he has not had any low blood pressures.  Patient is overdue for his eye exam for his diabetic retinopathy.  Patient does complain of pain in his left big toenail.  He states that the toenail is thickened and occasionally has some reddish discoloration after wearing socks for a long time.  He does get his nails cut at the nail salon.  Review of Systems  Constitutional: Negative.   HENT: Negative.    Respiratory: Negative.    Cardiovascular: Negative.   Gastrointestinal: Negative.   Musculoskeletal:        Complains of pain in his left big toenail  Neurological: Negative.   Psychiatric/Behavioral: Negative.          Objective:    BP 118/66   Pulse 75   Temp 97.8 F (36.6 C)   Ht 6' (1.829 m)   Wt 190 lb 12.8 oz (86.5 kg)   SpO2 93%   BMI 25.88 kg/m    Physical Exam Constitutional:       Appearance: Normal appearance.  HENT:     Head: Normocephalic and atraumatic.  Cardiovascular:     Rate and Rhythm: Normal rate and regular rhythm.     Heart sounds: Normal heart sounds.  Pulmonary:     Effort: Pulmonary effort is normal.     Breath sounds: Normal breath sounds. No wheezing, rhonchi or rales.  Abdominal:     General: Bowel sounds are normal. There is no distension.     Palpations: Abdomen is soft.     Tenderness: There is no abdominal tenderness.  Musculoskeletal:        General: No swelling or tenderness.     Right lower leg: No edema.     Left lower leg: No edema.     Comments: Left big toe with onychomycosis  Neurological:     General: No focal deficit present.     Mental Status: He is alert and oriented to person, place, and time.  Psychiatric:        Mood and Affect: Mood normal.        Behavior: Behavior normal.     No results found for any visits on 08/03/24.      Assessment & Plan:  Problem List Items Addressed This Visit       Cardiovascular and Mediastinum   Hypertension associated with diabetes (HCC) - Primary   - This problem is chronic and stable -He did have episodes of low blood pressures but after his losartan  was decreased to 50 mg once daily he has not had any issues with this. -States his blood pressures are well-controlled at home and his blood pressure today is 118/66 -We will continue his losartan  50 mg daily for now      Relevant Orders   Comprehensive metabolic panel with GFR     Endocrine   Diabetic retinopathy (HCC)   - Patient has a history of diabetic retinopathy and follows with [facility] -His last diabetic eye exam was on August 1 -I reiterated the importance of following up with his ophthalmologist and he will make an appointment to see them soon -No further workup at this time      Hyperlipidemia associated with type 2 diabetes mellitus (HCC)   - This problem is chronic and stable -Will recheck lipid  panel today -Continue with Crestor  10 mg daily -No further workup at this time      Relevant Orders   Lipid panel   Type 2 diabetes mellitus with complications (HCC)   - Patient history of type 2 diabetes with last A1c (over 3 months ago) of 8.5 -He has been following with the clinical pharmacist and having his medications adjusted.  He is currently on Tresiba  45 units at night as well as Humalog  4 units before breakfast and 4 units as needed before dinner (depending on the meal) as well as metformin  1000 mg twice daily -Cost has been an issue for SGLT2 inhibitors and GLP-1 agonists - His average blood sugar has been in the 150s over the last week -He does have some episodes of high blood sugars up to the 250s as well as occasional lows (down to the late 50s) -Will check A1c and CMP today as well as a urine microalbumin creatinine ratio -Continue with current medication regimen -No further workup at this time      Relevant Orders   Comprehensive metabolic panel with GFR   Hemoglobin A1c   Microalbumin / creatinine urine ratio   Ambulatory referral to Podiatry     Musculoskeletal and Integument   Onychomycosis   - Patient with onychomycosis in his left big toenail -States that this is occasionally painful for him -He has been getting his nails cut and the nail salon but has followed up with podiatry in the past -I have put in another referral to [facility] for him to ensure that he gets his nails trimmed at the podiatrist office instead of a nail salon -He will also follow-up with them for his onychomycosis -No further workup at this time      Relevant Orders   Ambulatory referral to Podiatry     Other   Routine general medical examination at a health care facility   - Patient is due for flu shot today and has received this in the clinic      Other Visit Diagnoses       Need for immunization against influenza       Relevant Orders   Flu vaccine HIGH DOSE  PF(Fluzone Trivalent) (Completed)       No orders of the defined types were placed in this encounter.   No follow-ups on file.  Tandra Rosado, MD

## 2024-08-03 NOTE — Assessment & Plan Note (Addendum)
-   Patient history of type 2 diabetes with last A1c (over 3 months ago) of 8.5 -He has been following with the clinical pharmacist and having his medications adjusted.  He is currently on Tresiba  45 units at night as well as Humalog  4 units before breakfast and 4 units as needed before dinner (depending on the meal) as well as metformin  1000 mg twice daily -Cost has been an issue for SGLT2 inhibitors and GLP-1 agonists - His average blood sugar has been in the 150s over the last week -He does have some episodes of high blood sugars up to the 250s as well as occasional lows (down to the late 50s) -Will check A1c and CMP today as well as a urine microalbumin creatinine ratio -Continue with current medication regimen -No further workup at this time  Addendum:  -Patient started to have an elevated UACR greater than 100 with a GFR greater than 60 -Given his proteinuria I wanted to start him on SGLT2 inhibitor but after discussion with clinical pharmacy he would need to pay approximately $50 a month for this medication.  Patient is not interested in starting any medication at this time -Patient is already on an ARB (losartan ).  We are unable to titrate this medication up given episodes of hypotension -His diabetes is better controlled now.  Would recheck his UACR at his next visit and revisit starting an SGLT2 inhibitor with him

## 2024-08-03 NOTE — Patient Instructions (Signed)
 [name],  Thank you for taking the time for your Medicare Wellness Visit. I appreciate your continued commitment to your health goals. Please review the care plan we discussed, and feel free to reach out if I can assist you further.  Medicare recommends these wellness visits once per year to help you and your care team stay ahead of potential health issues. These visits are designed to focus on prevention, allowing your provider to concentrate on managing your acute and chronic conditions during your regular appointments.  Please note that Annual Wellness Visits do not include a physical exam. Some assessments may be limited, especially if the visit was conducted virtually. If needed, we may recommend a separate in-person follow-up with your provider.  Ongoing Care Seeing your primary care provider every 3 to 6 months helps us  monitor your health and provide consistent, personalized care.  Remember to call and schedule a diabetic eye exam. Make sure that you update your flu and covid vaccines as discussed.   Referrals If a referral was made during today's visit and you haven't received any updates within two weeks, please contact the referred provider directly to check on the status.  Recommended Screenings:  Health Maintenance  Topic Date Due   Yearly kidney health urinalysis for diabetes  05/29/2021   Eye exam for diabetics  06/24/2024   Complete foot exam   06/29/2024   COVID-19 Vaccine (5 - 2025-26 season) 08/19/2024*   Flu Shot  02/21/2025*   Hemoglobin A1C  10/21/2024   Yearly kidney function blood test for diabetes  01/20/2025   Medicare Annual Wellness Visit  08/03/2025   DTaP/Tdap/Td vaccine (3 - Td or Tdap) 08/31/2028   Pneumococcal Vaccine for age over 34  Completed   Hepatitis C Screening  Completed   Zoster (Shingles) Vaccine  Completed   HPV Vaccine  Aged Out   Meningitis B Vaccine  Aged Out   Colon Cancer Screening  Discontinued  *Topic was postponed. The date shown  is not the original due date.       08/03/2024    9:53 AM  Advanced Directives  Does Patient Have a Medical Advance Directive? No  Would patient like information on creating a medical advance directive? No - Patient declined   Advance Care Planning is important because it: Ensures you receive medical care that aligns with your values, goals, and preferences. Provides guidance to your family and loved ones, reducing the emotional burden of decision-making during critical moments.  Vision: Annual vision screenings are recommended for early detection of glaucoma, cataracts, and diabetic retinopathy. These exams can also reveal signs of chronic conditions such as diabetes and high blood pressure.  Dental: Annual dental screenings help detect early signs of oral cancer, gum disease, and other conditions linked to overall health, including heart disease and diabetes.  Please see the attached documents for additional preventive care recommendations.

## 2024-08-03 NOTE — Assessment & Plan Note (Signed)
-   This problem is chronic and stable -He did have episodes of low blood pressures but after his losartan  was decreased to 50 mg once daily he has not had any issues with this. -States his blood pressures are well-controlled at home and his blood pressure today is 118/66 -We will continue his losartan  50 mg daily for now

## 2024-08-03 NOTE — Progress Notes (Signed)
 Subjective:   [name] is a 78 y.o. who presents for a Medicare Wellness preventive visit.  As a reminder, Annual Wellness Visits don't include a physical exam, and some assessments may be limited, especially if this visit is performed virtually. We may recommend an in-person follow-up visit with your provider if needed.  Visit Complete: Virtual I connected with  Lynwood FORBES Furnace on 08/03/24 by a audio enabled telemedicine application and verified that I am speaking with the correct person using two identifiers.  Patient Location: Home  Provider Location: Home Office  I discussed the limitations of evaluation and management by telemedicine. The patient expressed understanding and agreed to proceed.  Vital Signs: Because this visit was a virtual/telehealth visit, some criteria may be missing or patient reported. Any vitals not documented were not able to be obtained and vitals that have been documented are patient reported.  VideoDeclined- This patient declined Librarian, academic. Therefore the visit was completed with audio only.  Persons Participating in Visit: Patient.  AWV Questionnaire: No: Patient Medicare AWV questionnaire was not completed prior to this visit.  Cardiac Risk Factors include: advanced age (>35men, >37 women);diabetes mellitus;male gender;hypertension;dyslipidemia;Other (see comment), Risk factor comments: CAD     Objective:    Today's Vitals   08/03/24 0934  BP: 125/75  Weight: 185 lb (83.9 kg)  Height: 6' (1.829 m)   Body mass index is 25.09 kg/m.     08/03/2024    9:53 AM 09/15/2023    9:59 AM 08/25/2023    7:33 AM 08/03/2023   10:06 AM 04/24/2022    4:26 PM 12/05/2021    8:20 AM 12/02/2021    3:39 PM  Advanced Directives  Does Patient Have a Medical Advance Directive? No No No No No  No  Would patient like information on creating a medical advance directive? No - Patient declined No - Patient declined No - Patient declined  No - Patient declined No - Patient declined No - Patient declined No - Patient declined    Current Medications (verified) Outpatient Encounter Medications as of 08/03/2024  Medication Sig   aspirin 81 MG tablet Take 81 mg by mouth daily.   calcium  carbonate (OSCAL) 1500 (600 Ca) MG TABS tablet Take 600 mg of elemental calcium  by mouth daily with breakfast.   Cholecalciferol (VITAMIN D -3) 125 MCG (5000 UT) TABS Take 5,000 Units by mouth daily.   Continuous Glucose Receiver (FREESTYLE LIBRE 3 READER) DEVI Use to check blood sugar continuously. Please only fill for patient if $0, otherwise can continue with Libre2.   Dx: E11.65, Z79.4   Continuous Glucose Sensor (FREESTYLE LIBRE 3 PLUS SENSOR) MISC Change sensor every 15 days. Dx: E11.65, Z79.4   Cyanocobalamin  (VITAMIN B-12) 1000 MCG SUBL Place 1 tablet (1,000 mcg total) under the tongue daily.   dutasteride  (AVODART ) 0.5 MG capsule Take 1 capsule (0.5 mg total) by mouth daily.   glucose blood test strip Use as instructed   insulin  degludec (TRESIBA  FLEXTOUCH) 200 UNIT/ML FlexTouch Pen Inject 50 Units into the skin daily. (Patient taking differently: Inject 45 Units into the skin daily.)   insulin  lispro (HUMALOG  KWIKPEN) 100 UNIT/ML KwikPen 5 units sq up to TIDAC. Increase only as instructed to max 10 units TIDAC. 9 mL = 30DS,   Insulin  Syringe-Needle U-100 (INSULIN  SYRINGE .3CC/31GX5/16) 31G X 5/16 0.3 ML MISC Use to inject insulin  up to 3 times per day as instructed   Lancets (ONETOUCH ULTRASOFT) lancets Use as instructed 2x  daily Dx: 250.00   Lancets (ONETOUCH ULTRASOFT) lancets Bid Use as instructed   losartan  (COZAAR ) 50 MG tablet TAKE 1 TABLET BY MOUTH EVERYDAY AT BEDTIME   MAGNESIUM  PO Take 1 tablet by mouth daily.   metFORMIN  (GLUCOPHAGE -XR) 500 MG 24 hr tablet Take 2 tablets (1,000 mg total) by mouth 2 (two) times daily with a meal.   omeprazole  (PRILOSEC) 20 MG capsule TAKE 1 CAPSULE BY MOUTH EVERY DAY   rosuvastatin  (CRESTOR ) 10 MG  tablet TAKE 1 TABLET BY MOUTH EVERY DAY   tadalafil  (CIALIS ) 10 MG tablet Take 1-2 tablets (10-20 mg total) by mouth daily as needed for erectile dysfunction (take 1 hour prior to sexual activity).   tamsulosin  (FLOMAX ) 0.4 MG CAPS capsule Take 1 capsule (0.4 mg total) by mouth daily.   tiZANidine (ZANAFLEX) 4 MG tablet Take 4 mg by mouth 3 (three) times daily.   vitamin E 400 UNIT capsule Take 400 Units by mouth daily.   diclofenac  Sodium (VOLTAREN ) 1 % GEL Apply 2 g topically 4 (four) times daily. (Patient not taking: Reported on 08/03/2024)   Na Sulfate-K Sulfate-Mg Sulf 17.5-3.13-1.6 GM/177ML SOLN See admin instructions. (Patient not taking: Reported on 08/03/2024)   No facility-administered encounter medications on file as of 08/03/2024.    Allergies (verified) Sulfamethoxazole-trimethoprim   History: 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   DYSPHAGIA UNSPECIFIED 09/29/2008   Dysplastic nevus 02/20/2021   L post thigh - mild    ESOPHAGEAL STRICTURE 12/28/2008   GERD 11/28/2008   History of kidney stones    HYPERLIPIDEMIA 08/03/2007   Mild tricuspid regurgitation by prior echocardiogram    OSTEOARTHRITIS, 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   PAIN IN SOFT TISSUES OF LIMB 01/07/2008   Prostate cancer (HCC)    UNSPECIFIED ANEMIA 12/29/2007   Unspecified essential hypertension 01/07/2008   URINARY CALCULUS 09/29/2008   Urine incontinence    Past Surgical History:  Procedure Laterality Date   CATARACT EXTRACTION W/PHACO Right 08/25/2023   Procedure: CATARACT EXTRACTION PHACO  AND INTRAOCULAR LENS PLACEMENT (IOC) RIGHT CLAREON VIVITY TORIC DIABETIC 13.53 01:17.7;  Surgeon: Jaye Fallow, MD;  Location: [facility];  Service: Ophthalmology;  Laterality: Right;   CATARACT EXTRACTION W/PHACO Left 09/15/2023   Procedure: CATARACT EXTRACTION PHACO AND INTRAOCULAR LENS PLACEMENT (IOC) LEFT CLAREON VIVITY TORIC DIABETIC 14.37 01:14.9;  Surgeon: Jaye Fallow, MD;  Location: [facility];  Service: Ophthalmology;  Laterality: Left;   CHOLECYSTECTOMY N/A 11/12/2016   Procedure: LAPAROSCOPIC CHOLECYSTECTOMY WITH INTRAOPERATIVE CHOLANGIOGRAM;  Surgeon: Carlin Pastel, MD;  Location: [facility];  Service: General;  Laterality: N/A;   COLONOSCOPY     polyp   dupyretens contracture     right hand surgery,left hand    HAND SURGERY Right    2016   LITHOTRIPSY     POLYPECTOMY     PROSTATE BIOPSY N/A 12/05/2021   Procedure: PROSTATE BIOPSY GRAYCE;  Surgeon: Kassie Ozell SAUNDERS, MD;  Location: [facility];  Service: Urology;  Laterality: N/A;   SHOULDER SURGERY Left    left, cyst and tumor removed  URINARY SURGERY  2018   Dr Harles Geralds stretched urethra around prostate to increase urine flow   Family History  Problem Relation Age of Onset   Diabetes Mother    Depression Mother    Prostate cancer Brother    Diabetes Brother    Heart disease Brother    Diabetes Brother    Colon cancer Neg Hx    Esophageal cancer Neg Hx    Rectal cancer Neg Hx    Stomach cancer Neg Hx    Colon polyps Neg Hx    Crohn's disease Neg Hx    Social History   Socioeconomic History   Marital status: Married    Spouse name: Not on file   Number of children: 2   Years of education: Not on file   Highest education level: Associate degree: occupational, Scientist, product/process development, or vocational program  Occupational History   Occupation: Therapist, occupational: [employer]  Tobacco Use   Smoking status: Never    Passive exposure: Past   Smokeless tobacco: Never  Vaping Use    Vaping status: Never Used  Substance and Sexual Activity   Alcohol use: Not Currently    Comment: occasional beer   Drug use: No   Sexual activity: Not on file  Other Topics Concern   Not on file  Social History Narrative   Daily Caffeine Use 1-2 daily   Married    Never smoker    Wears seat belt, safe in relationship    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, retired    Chief Executive Officer Drivers of Corporate investment banker Strain: Low Risk  (08/03/2024)   Overall Financial Resource Strain (CARDIA)    Difficulty of Paying Living Expenses: Not hard at all  Food Insecurity: No Food Insecurity (08/03/2024)   Hunger Vital Sign    Worried About Running Out of Food in the Last Year: Never true    Ran Out of Food in the Last Year: Never true  Transportation Needs: No Transportation Needs (08/03/2024)   PRAPARE - Administrator, Civil Service (Medical): No    Lack of Transportation (Non-Medical): No  Physical Activity: Inactive (08/03/2024)   Exercise Vital Sign    Days of Exercise per Week: 0 days    Minutes of Exercise per Session: 0 min  Stress: No Stress Concern Present (08/03/2024)   [employer] of Occupational Health - Occupational Stress Questionnaire    Feeling of Stress: Not at all  Social Connections: Moderately Integrated (08/03/2024)   Social Connection and Isolation Panel    Frequency of Communication with Friends and Family: More than three times a week    Frequency of Social Gatherings with Friends and Family: Three times a week    Attends Religious Services: More than 4 times per year    Active Member of Clubs or Organizations: No    Attends Banker Meetings: Never    Marital Status: Married    Tobacco Counseling Counseling given: Not Answered    Clinical Intake:  Pre-visit preparation completed: Yes  Pain : No/denies pain     BMI - recorded: 25.09 Nutritional Status: BMI 25 -29 Overweight Nutritional Risks: None Diabetes: Yes CBG done?: Yes (per patient FBS  73)  Lab Results  Component Value Date   HGBA1C 8.5 (A) 04/20/2024   HGBA1C 7.8 (A) 01/21/2024   HGBA1C 8.4 (A) 10/13/2023     How often do you need to have someone help you when you read instructions, pamphlets,  or other written materials from your doctor or pharmacy?: 1 - Never  Interpreter Needed?: No  Information entered by :: R. Bernadette Armijo LPN   Activities of Daily Living     08/03/2024    9:36 AM 09/15/2023   10:00 AM  In your present state of health, do you have any difficulty performing the following activities:  Hearing? 1 1  Vision? 0 0  Difficulty concentrating or making decisions? 0 0  Walking or climbing stairs? 0   Dressing or bathing? 0   Doing errands, shopping? 0   Preparing Food and eating ? N   Using the Toilet? N   In the past six months, have you accidently leaked urine? N   Do you have problems with loss of bowel control? N   Managing your Medications? N   Managing your Finances? N   Housekeeping or managing your Housekeeping? N     Patient Care Team: Poggi, Norleen PARAS, MD as Consulting Physician (Orthopedic Surgery) Francisca Redell BROCKS, MD as Consulting Physician (Urology) Reddy, Pallavi D, MD as Consulting Physician (Sleep Medicine)  I have updated your Care Teams any recent Medical Services you may have received from other providers in the past year.     Assessment:   This is a routine wellness examination for Jeramie.  Hearing/Vision screen Hearing Screening - Comments:: Some hearing loss Vision Screening - Comments:: readers   Goals Addressed             This Visit's Progress    Patient Stated       Start  a walking program      Patient Stated       Wants to start walking 3 times a week        Depression Screen     08/03/2024    9:48 AM 04/20/2024    9:05 AM 01/21/2024    8:21 AM 01/13/2024    8:31 AM 12/17/2023    3:19 PM 10/13/2023    7:57 AM 08/14/2023    8:59 AM  PHQ 2/9 Scores  PHQ - 2 Score 0 0 0 0 0 0 0  PHQ- 9 Score 1 0 0 0  2 0 2    Fall Risk     08/03/2024    9:39 AM 04/20/2024    9:05 AM 01/21/2024    8:20 AM 01/13/2024    8:31 AM 12/17/2023    3:18 PM  Fall Risk   Falls in the past year? 1 1 0 0 0  Number falls in past yr: 0 0 0 0 0  Injury with Fall? 1 1 0 0 0  Risk for fall due to : History of fall(s);Impaired balance/gait No Fall Risks No Fall Risks No Fall Risks No Fall Risks  Follow up Falls evaluation completed;Falls prevention discussed Falls evaluation completed;Education provided Falls prevention discussed Falls evaluation completed Falls evaluation completed    MEDICARE RISK AT HOME:  Medicare Risk at Home Any stairs in or around the home?: No If so, are there any without handrails?: No Home free of loose throw rugs in walkways, pet beds, electrical cords, etc?: Yes Adequate lighting in your home to reduce risk of falls?: Yes Life alert?: No Use of a cane, walker or w/c?: No Grab bars in the bathroom?: No Shower chair or bench in shower?: No Elevated toilet seat or a handicapped toilet?: No  TIMED UP AND GO:  Was the test performed?  No  Cognitive Function: 6CIT completed  08/03/2024    9:53 AM 08/03/2023   10:07 AM 09/04/2020    9:59 AM 09/02/2019    9:54 AM  6CIT Screen  What Year? 0 points 0 points 0 points 0 points  What month? 0 points 0 points 0 points 0 points  What time? 0 points 0 points 0 points 0 points  Count back from 20 0 points 0 points  0 points  Months in reverse 0 points 2 points  0 points  Repeat phrase 2 points 0 points  0 points  Total Score 2 points 2 points  0 points    Immunizations Immunization History  Administered Date(s) Administered    sv, Bivalent, Protein Subunit Rsvpref,pf (Abrysvo) 01/12/2024   Fluad Quad(high Dose 65+) 07/27/2019, 08/01/2020, 10/01/2021   INFLUENZA, HIGH DOSE SEASONAL PF 08/21/2016, 08/25/2017, 08/31/2018   Influenza Whole 08/24/2009, 08/25/2011   Influenza,inj,Quad PF,6+ Mos 10/29/2015   Influenza,trivalent,  recombinat, inj, PF 08/04/2023   Influenza-Unspecified 10/29/2015   Moderna SARS-COV2 Booster Vaccination 11/24/2020, 03/19/2021   Moderna Sars-Covid-2 Vaccination 01/05/2020, 02/02/2020   PNEUMOCOCCAL CONJUGATE-20 06/27/2022   Pfizer Covid-19 Vaccine Bivalent Booster 33yrs & up 08/04/2023   Pneumococcal Conjugate-13 01/23/2017   Pneumococcal Polysaccharide-23 09/25/2003, 03/27/2015   Td 09/25/2003   Tdap 08/31/2018   Zoster Recombinant(Shingrix ) 02/05/2023, 06/09/2023    Screening Tests Health Maintenance  Topic Date Due   Diabetic kidney evaluation - Urine ACR  05/29/2021   Influenza Vaccine  06/24/2024   OPHTHALMOLOGY EXAM  06/24/2024   FOOT EXAM  06/29/2024   COVID-19 Vaccine (4 - 2025-26 season) 07/25/2024   Medicare Annual Wellness (AWV)  08/02/2024   HEMOGLOBIN A1C  10/21/2024   Diabetic kidney evaluation - eGFR measurement  01/20/2025   DTaP/Tdap/Td (3 - Td or Tdap) 08/31/2028   Pneumococcal Vaccine: 50+ Years  Completed   Hepatitis C Screening  Completed   Zoster Vaccines- Shingrix   Completed   HPV VACCINES  Aged Out   Meningococcal B Vaccine  Aged Out   Colonoscopy  Discontinued    Health Maintenance Items Addressed: Discussed the need to update flu and covid vaccines. Patient needs a diabetic foot exam at next office visit.  Additional Screening:  Vision Screening: Recommended annual ophthalmology exams for early detection of glaucoma and other disorders of the eye. Is the patient up to date with their annual eye exam?  No  Who is the provider or what is the name of the office in which the patient attends annual eye exams? [facility]  Patient stated that he will call and schedule a diabetic eye exam.   Dental Screening: Recommended annual dental exams for proper oral hygiene  Community Resource Referral / Chronic Care Management: CRR required this visit?  No   CCM required this visit?  No   Plan:    I have personally reviewed and noted the following  in the patient's chart:   Medical and social history Use of alcohol, tobacco or illicit drugs  Current medications and supplements including opioid prescriptions. Patient is not currently taking opioid prescriptions. Functional ability and status Nutritional status Physical activity Advanced directives List of other physicians Hospitalizations, surgeries, and ER visits in previous 12 months Vitals Screenings to include cognitive, depression, and falls Referrals and appointments  In addition, I have reviewed and discussed with patient certain preventive protocols, quality metrics, and best practice recommendations. A written personalized care plan for preventive services as well as general preventive health recommendations were provided to patient.   Angeline Fredericks, LPN  08/03/2024   After Visit Summary: (MyChart) Due to this being a telephonic visit, the after visit summary with patients personalized plan was offered to patient via MyChart   Notes: Nothing significant to report at this time.

## 2024-08-03 NOTE — Assessment & Plan Note (Signed)
-   Patient with onychomycosis in his left big toenail -States that this is occasionally painful for him -He has been getting his nails cut and the nail salon but has followed up with podiatry in the past -I have put in another referral to [facility] for him to ensure that he gets his nails trimmed at the podiatrist office instead of a nail salon -He will also follow-up with them for his onychomycosis -No further workup at this time

## 2024-08-03 NOTE — Assessment & Plan Note (Signed)
-   Patient is due for flu shot today and has received this in the clinic

## 2024-08-03 NOTE — Patient Instructions (Addendum)
-   It was a pleasure meeting you today -Your blood pressure is well-controlled.  You have of the great work! - He still has some episodes of elevated blood sugars up to the 250s but your average blood sugars remained in the 150s.  We will recheck an A1c today -You are also due for other blood work including a cholesterol panel, kidney function and liver function tests as well as urine protein test.  We will get these today for you as well -I have put in referral to podiatry for you given the fungal infection in your nail as well as the need to follow-up with podiatry for regular toenail clippings instead of the nail salon to decrease risk of infection -Will give you a flu shot today -You are overdue for your eye appointment.  Please make an appointment with [facility] -Please contact us  with any questions or concerns

## 2024-08-04 LAB — MICROALBUMIN / CREATININE URINE RATIO
Creatinine,U: 120.9 mg/dL
Microalb Creat Ratio: 106.3 mg/g — ABNORMAL HIGH (ref 0.0–30.0)
Microalb, Ur: 12.9 mg/dL — ABNORMAL HIGH (ref 0.0–1.9)

## 2024-08-04 LAB — COMPREHENSIVE METABOLIC PANEL WITH GFR
ALT: 15 U/L (ref 0–53)
AST: 17 U/L (ref 0–37)
Albumin: 4.7 g/dL (ref 3.5–5.2)
Alkaline Phosphatase: 46 U/L (ref 39–117)
BUN: 18 mg/dL (ref 6–23)
CO2: 27 meq/L (ref 19–32)
Calcium: 10.2 mg/dL (ref 8.4–10.5)
Chloride: 101 meq/L (ref 96–112)
Creatinine, Ser: 1.08 mg/dL (ref 0.40–1.50)
GFR: 66.79 mL/min (ref >60.00)
Glucose, Bld: 123 mg/dL — ABNORMAL HIGH (ref 70–99)
Potassium: 4.4 meq/L (ref 3.5–5.1)
Sodium: 138 meq/L (ref 135–145)
Total Bilirubin: 0.9 mg/dL (ref 0.2–1.2)
Total Protein: 7.3 g/dL (ref 6.0–8.3)

## 2024-08-04 LAB — LIPID PANEL
Cholesterol: 84 mg/dL (ref 0–200)
HDL: 43.7 mg/dL (ref >39.00)
LDL Cholesterol: 19 mg/dL (ref 0–99)
NonHDL: 40.33
Total CHOL/HDL Ratio: 2
Triglycerides: 106 mg/dL (ref 0.0–149.0)
VLDL: 21.2 mg/dL (ref 0.0–40.0)

## 2024-08-04 LAB — HEMOGLOBIN A1C: Hgb A1c MFr Bld: 6.9 % — ABNORMAL HIGH (ref 4.6–6.5)

## 2024-08-05 ENCOUNTER — Ambulatory Visit: Payer: Self-pay | Admitting: Internal Medicine

## 2024-08-10 ENCOUNTER — Ambulatory Visit (INDEPENDENT_AMBULATORY_CARE_PROVIDER_SITE_OTHER): Admitting: Podiatry

## 2024-08-10 VITALS — Ht 72.0 in | Wt 190.8 lb

## 2024-08-10 DIAGNOSIS — L600 Ingrowing nail: Secondary | ICD-10-CM | POA: Diagnosis not present

## 2024-08-13 NOTE — Progress Notes (Signed)
  Subjective:  Patient ID: [name], male    DOB: 1946-04-06,  MRN: [id number]  Chief Complaint  Patient presents with   Nail Problem    Rm 7 DFC/Nail Trim    78 y.o. male presents with the above complaint. History confirmed with patient.  Dealing with ingrown toenail again.  His A1c is better at 6.9%  Objective:  Physical Exam: warm, good capillary refill, no trophic changes or ulcerative lesions, normal DP and PT pulses, and normal sensory exam. Left Foot: dystrophic yellowed discolored nail plates with subungual debris.  Ingrown medial border without paronychia Right Foot: dystrophic yellowed discolored nail plates with subungual debris   Assessment:   1. Ingrowing left great toenail      Plan:  Patient was evaluated and treated and all questions answered.  Again discussed partial right matricectomy.  He states he did not want to do the procedure today and wants some time to think over the risks and benefits which we discussed.  I think his A1c is reasonable to proceed with the procedure.  Follow-up with me in 4 weeks for the procedure if he would like to proceed with it.  Otherwise may follow-up as needed.   Return in about 4 weeks (around 09/07/2024) for left great toenail removal.

## 2024-08-23 ENCOUNTER — Telehealth: Payer: Self-pay | Admitting: Family

## 2024-08-23 ENCOUNTER — Other Ambulatory Visit (INDEPENDENT_AMBULATORY_CARE_PROVIDER_SITE_OTHER): Admitting: Pharmacist

## 2024-08-23 ENCOUNTER — Other Ambulatory Visit: Payer: Self-pay

## 2024-08-23 DIAGNOSIS — R809 Proteinuria, unspecified: Secondary | ICD-10-CM

## 2024-08-23 DIAGNOSIS — I152 Hypertension secondary to endocrine disorders: Secondary | ICD-10-CM

## 2024-08-23 DIAGNOSIS — K2100 Gastro-esophageal reflux disease with esophagitis, without bleeding: Secondary | ICD-10-CM

## 2024-08-23 DIAGNOSIS — E118 Type 2 diabetes mellitus with unspecified complications: Secondary | ICD-10-CM

## 2024-08-23 DIAGNOSIS — E1169 Type 2 diabetes mellitus with other specified complication: Secondary | ICD-10-CM

## 2024-08-23 MED ORDER — OMEPRAZOLE 20 MG PO CPDR: 20.0000 mg | DELAYED_RELEASE_CAPSULE | Freq: Every day | ORAL | 1 refills | Status: AC

## 2024-08-23 MED ORDER — EMPAGLIFLOZIN 10 MG PO TABS: 10.0000 mg | ORAL_TABLET | Freq: Every day | ORAL | 4 refills | Status: AC

## 2024-08-23 NOTE — Telephone Encounter (Signed)
 Spoke to pt he will pick up Jardiance  today and I scheduled a f/up with Narendra for 11/02/2024 to check A1C

## 2024-08-23 NOTE — Telephone Encounter (Signed)
 Call pt  I prescribed jardiance  10mg  after reading pharmacy note, [name].  Sch f/u with one of us , Narendra saw him last , 3 mos from last A1c.   TOC with [facility] is late January and A1c is due prior to then

## 2024-08-23 NOTE — Progress Notes (Signed)
 Reviewed and agree with jardiance  10mg . A1c 6.9, urine micro ratio 106, crt 1.08.  TOC with Dr Abbey 12/20/23 Rollene Northern, NP   Brief Telephone Documentation Reason for Call: Insulin  monitoring/chronic disease state management  Chief Complaint  Patient presents with   Diabetes   Hypertension   Reason for visit: ?  [name] is a 78 y.o. male with a history of diabetes (type 2), who presents today for a follow up diabetes pharmacotherapy visit surrounding his chronic conditions (DM2, HTN, HLD).?  Pertinent PMH includes DM2, HTN, CAD, Asthma/emphysema, GERD, chronic constipation, FLD, cholecystitis, HLD, CKD, hx kidney stones.   Care Team: Primary Care Provider: Hope Merle, MD ? [facility] (future). Office visit 08/01/24 w Dr. Onesimo.  Provider of the day: Arnett   Known DM Complications: retinopathy, HTN, CAD, Diabetic Kidney Disease with microalbuminuria, hx retinopathy documented  Summary of recent change: 9/8: UACR elevated. SGLt2i recommended though cost concerns  8/5: SMBG very well controlled though continued significant freq post-dinner lows w FBG all WNL. Hold evening dose of Humalog  7/24: Reduce losartan  50 mg BID ? 50 mg at bedtime (hypotension) 6/10: ?? Tresiba  10% - 50 to 45 units daily 2/2 fasting hypoglycemia.   Since Last Visit: #Diabetes Since last visit, reports doing well overall. Feels his sugars have been variable. Feels sugars are better when at the house, though when traveling, sugars are slightly higher (most weekends, travels to the beach). Still struggles with using Humalog  when traveling. Concerned about keeping it cool when out and about (would leave in car).   Reports fasting 80-130s mg/dL. Occasional lows overnight, no longer seeing lows after/between meals.   Questions abnormality on most recent renal lab result (UACR). Asks if 2 cups of coffee every morning is harmful. Continues to struggle with hydration. 2 coffees every morning, usually no  water.   Reported Regimen:  Tresiba  45 units once daily (morning) Humalog  5-6 units before breakfast (has added in PRN 5 units w dinner) Metformin  XR 1000 mg twice daily   SMBG: Libre 3+ (uses reader) Time in target (Last 7 Days): Above 33%; In target 66%; Below 1% Average glucose (Last 7 Days): 160 mg/dL Average glucose by time of day:     12 am - 6 am: 130       6 am - 12 pm: 156      12 pm - 6 pm: 180      6 pm - 12 am: 170  Low glucose events last 7 days (12am-6am) Last 14 days = 4 total (12 am-6am)   #Hypertension Continues checking blood pressures at home intermittently (lately, has been checking twice daily). Takes BP medication in the evening. One day notes pressure ~90/70. No further symptoms of low blood pressures (symptoms tend to occur with systolics <100).    SMBP: Mornings run 100s-110s/high 60s (highest 140s/69) Evenings run 105-122/69 (highest 134/70) Morning 132/75, 135/67, 147/71 Evening: 108/62, 104/64, 122/65 Reported Regimen:  Losartan  50 mg once daily (evening)   Objective  BP Readings from Last 3 Encounters:  08/03/24 118/66  08/03/24 125/75  04/20/24 124/66   Lab Results  Component Value Date   HGBA1C 6.9 (H) 08/03/2024   HGBA1C 8.5 (A) 04/20/2024   HGBA1C 7.8 (A) 01/21/2024   GLUCOSE 123 (H) 08/03/2024   GLUCOSE 202 (H) 01/21/2024   GLUCOSE 161 (H) 08/14/2023   MICRALBCREAT 106.3 (H) 08/03/2024   MICRALBCREAT 19 05/29/2020   MICRALBCREAT 40.4 (H) 11/07/2009   CREATININE 1.08 08/03/2024   CREATININE  1.07 01/21/2024   CREATININE 1.08 08/14/2023   GFR 66.79 08/03/2024   GFR 67.80 01/21/2024   GFR 67.25 08/14/2023   Lab Results  Component Value Date   CHOL 84 08/03/2024   LDLDIRECT 46.0 08/31/2018   AST 17 08/03/2024   ALT 15 08/03/2024     Chemistry      Component Value Date/Time   NA 138 08/03/2024 1433   K 4.4 08/03/2024 1433   CL 101 08/03/2024 1433   CO2 27 08/03/2024 1433   BUN 18 08/03/2024 1433   CREATININE 1.08  08/03/2024 1433      Component Value Date/Time   CALCIUM  10.2 08/03/2024 1433   ALKPHOS 46 08/03/2024 1433   AST 17 08/03/2024 1433   ALT 15 08/03/2024 1433   BILITOT 0.9 08/03/2024 1433      Assessment/Plan:  #Renal protection/CKD Long conversation today about renal function/protective therapies in the setting of his albuminuria given patient-reported concern about further progression of albuminuria/CKD. eGFR remains steady >60. Prior hx sustained albuminuria, though had improved on labs 4 yr ago and was not repeated. Tried SGLT2i prior though stopped as he did not feel cost was worth it. SGLT2i was re-recommended by Dr. Vida earlier this month. Today, after long discussion of updated renal labs, reports interest in re-trying SGLT2i. Jardiance  remains ~$50/month which patient feels is feasible. Also established goal of keeping water bottle on desk as visual reminder/goal of increasing water intake.  Start Jardiance  10 mg/d Repeat BMP ~4 weeks Consider increase to 25 mg/d thereafter for glycemic benefit if tolerating   #Diabetes: Fair Control per CGM today, slightly higher than previous which patient feels is related to weekly travel to the beach. Eats worse when traveling but also does not bring Humalog  with him due to fear of it sitting in hot car. Is open to looking for thermos/cooler given significant increase in PPBG on average. Last A1c at 6.9% significantly improved.  Hypoglycemia: No longer having mid-day lows related to Humalog  use. Still occasional lows isolated to overnight.  Current Regimen: MTF 100mg  BID, tresiba  45 u/d, Huamlog 5-6u AC breakfast/dinner if high-carb meal Start Jardiance  10 mg daily, as above Reduce Tresiba  slightly to 43 units once daily per ongoing overnight lows Continue Humalog  5-6 units AC breakfast, AC dinner only if having high-carb meal (ex spaghetti, out to eat, etc.) Future consideration: Cost is somewhat of a barrier to brand-name medications  (GLP1, SGLT2i, DPP4i). Feels he can afford though unsure if benefit outweighs cost generally.    #Hypertension: Controlled per last clinic pressure 118/66 mmHg. Hypotension improved with consistency of losartan  50 mg once daily rather than twice daily over the past few months.  One reading of 90/60, resolved with food/drink - feels he may have been dehydrated. Morning pressures all at goal, though evening pressures tend to be high, slightly above goal (130s-150s at times). Patient agreeable to reducing coffee intake in the morning from 2 cups to 1 cup.  Continue losartan  50 mg daily  Reduce caffeine from 2 cups coffee to 1 cup coffee each morning (or half-caf vs decaf) Continue to monitor home pressures periodically + Check BP if any symptoms of dizziness, lightheadedness, low energy, headache, vision change.  Reviewed importance of not skipping meals and remaining well-hydrated, especially before traveling or starting yard work.   Follow Up: PharmD follow up via phone 4 weeks PCP TOC scheduled ~4 months  Patient given direct line for further questions/concerns while away on vacation.    Future Appointments  Date Time Provider Department Center  09/14/2024  1:45 PM Silva Juliene SAUNDERS, DPM [facility]  12/19/2024  1:00 PM Abbey Bruckner, MD [facility] [address]  03/30/2025 10:30 AM Francisca Redell BROCKS, MD [facility] None  08/08/2025  8:50 AM [facility] ANNUAL WELLNESS VISIT [facility] [address] FABIENE Geronimo, PharmD Clinical Pharmacist [facility] [phone]

## 2024-09-14 ENCOUNTER — Ambulatory Visit (INDEPENDENT_AMBULATORY_CARE_PROVIDER_SITE_OTHER): Admitting: Podiatry

## 2024-09-14 VITALS — Ht 72.0 in | Wt 190.8 lb

## 2024-09-14 DIAGNOSIS — L600 Ingrowing nail: Secondary | ICD-10-CM

## 2024-09-14 DIAGNOSIS — E1142 Type 2 diabetes mellitus with diabetic polyneuropathy: Secondary | ICD-10-CM

## 2024-09-14 DIAGNOSIS — M722 Plantar fascial fibromatosis: Secondary | ICD-10-CM

## 2024-09-14 NOTE — Patient Instructions (Addendum)
 Use these orthotics for your foot pain when walking:   https://www.aetrex.com/products/l231m-m  Aetrex Men's  Conform   Posted Orthotics  W/Metatarsal Support

## 2024-09-15 NOTE — Progress Notes (Signed)
  Subjective:  Patient ID: [name], male    DOB: [birth year],  MRN: [id number]  Chief Complaint  Patient presents with   Toe Pain    He states his left foot has a knot on the bottom-no pain- but is noticeable when he walks. He also states that there is a possibility that the left great toenail will be removed today. He is concerned about healing with his diabetes and other health complications    78 y.o. male presents with the above complaint. History confirmed with patient.  The toenail is no longer hurting.  He occasionally gets a sharp shooting pain from the big toe up.  This is random and intermittent and manageable for him.  His A1c and blood sugar remains well-controlled.  He also has a large lump in the plantar left arch  Objective:  Physical Exam: warm, good capillary refill, no trophic changes or ulcerative lesions, normal DP and PT pulses, and normal sensory exam. Left Foot: dystrophic yellowed discolored nail plates with subungual debris.  Today no ingrown pain.  Plantar fibroma plantar medial arch Right Foot: dystrophic yellowed discolored nail plates with subungual debris   Assessment:   1. Ingrowing left great toenail   2. Diabetic polyneuropathy associated with type 2 diabetes mellitus (HCC)   3. Plantar fibromatosis      Plan:  Patient was evaluated and treated and all questions answered.  As the ingrown toenail is no longer symptomatic I do not recommend matricectomy at this point he will let me know if this returns or worsens.  Discussed etiology and treatment options of plantar fibroma on the left foot.Asymptomatic right now I think him symptomatic I will inject.   Return if symptoms worsen or fail to improve.

## 2024-09-16 ENCOUNTER — Other Ambulatory Visit: Payer: Self-pay

## 2024-09-16 DIAGNOSIS — E118 Type 2 diabetes mellitus with unspecified complications: Secondary | ICD-10-CM

## 2024-09-16 MED ORDER — TRESIBA FLEXTOUCH 200 UNIT/ML ~~LOC~~ SOPN: 50.0000 [IU] | PEN_INJECTOR | Freq: Every day | SUBCUTANEOUS | 0 refills | Status: DC

## 2024-09-16 NOTE — Telephone Encounter (Signed)
 Copied from CRM [id number]. Topic: Clinical - Medication Refill >> Sep 16, 2024 10:51 AM Franky GRADE wrote: Medication: insulin  degludec (TRESIBA  FLEXTOUCH) 200 UNIT/ML FlexTouch Pen [[id number]]  Has the patient contacted their pharmacy? Yes, they asked patient to contact the office.  (Agent: If no, request that the patient contact the pharmacy for the refill. If patient does not wish to contact the pharmacy document the reason why and proceed with request.) (Agent: If yes, when and what did the pharmacy advise?)  This is the patient's preferred pharmacy:  [id number] - [place], [place] - [address] [place] [address] Phone: [phone] Fax: [phone]  Is this the correct pharmacy for this prescription? Yes If no, delete pharmacy and type the correct one.   Has the prescription been filled recently? no  Is the patient out of the medication? No  Has the patient been seen for an appointment in the last year OR does the patient have an upcoming appointment? Yes  Can we respond through MyChart? Yes  Agent: Please be advised that Rx refills may take up to 3 business days. We ask that you follow-up with your pharmacy.

## 2024-09-20 ENCOUNTER — Other Ambulatory Visit: Admitting: Pharmacist

## 2024-09-20 DIAGNOSIS — I152 Hypertension secondary to endocrine disorders: Secondary | ICD-10-CM

## 2024-09-20 DIAGNOSIS — E118 Type 2 diabetes mellitus with unspecified complications: Secondary | ICD-10-CM

## 2024-09-20 MED ORDER — TRESIBA FLEXTOUCH 200 UNIT/ML ~~LOC~~ SOPN: 40.0000 [IU] | PEN_INJECTOR | Freq: Every day | SUBCUTANEOUS | Status: DC

## 2024-09-20 NOTE — Patient Instructions (Addendum)
 [name],   It was a pleasure to speak with you today! As we discussed:?   Reduce Tresiba  to 40 units once daily  Continue Jardiance  10 mg daily Continue metformin  2 tablets twice daily (morning/evening). Metformin  will not cause your sugars to fall overnight so you do not need to skip evening doses. Instead, if we start seeing lower numbers at night/early morning, this tells us  we likely need a little bit less of the Tresiba  insulin .  Continue your Humalog  with breakfast only. Skip if you are not eating breakfast.   The current [employer] healthcare plan you have now does have [facility] providers in Network including your new provider (Dr. Abbey).  Consider looking at united health plans with better drug coverage (brand medications in 2026 you will be responsible for paying 18% of the drug cash price, meaning likely very expensive to stay on some of your current medications.      Please reach out prior to your next scheduled appointment should you have any questions or concerns.  You may reach me via MyChart Message, or you may leave me a voicemail at [phone] and I will get back to you shortly.   Thank you!   Future Appointments  Date Time Provider Department Center  10/11/2024  9:00 AM [facility] PHARMACIST [facility] [address]  11/02/2024  1:00 PM Onesimo Claude, MD [facility] [address]  12/19/2024  1:00 PM Abbey Bruckner, MD [facility] [address]  12/29/2024 10:15 AM Gaynel Delon CROME, DPM [facility]  03/30/2025 10:30 AM Francisca Redell BROCKS, MD [facility] None  08/08/2025  8:50 AM [facility] ANNUAL WELLNESS VISIT [facility] [address] FABIENE Geronimo, PharmD Clinical Pharmacist [facility] [phone]

## 2024-09-20 NOTE — Progress Notes (Signed)
 Brief Telephone Documentation Reason for Call: Insulin  monitoring/chronic disease state management  Chief Complaint  Patient presents with   Diabetes   Hypertension   Reason for visit: ?  [name] is a 78 y.o. male with a history of diabetes (type 2), who presents today for a follow up pharmacotherapy visit surrounding his chronic conditions (DM2, HTN, HLD).?  Pertinent PMH includes DM2, HTN, CAD, Asthma/emphysema, GERD, chronic constipation, FLD, cholecystitis, HLD, CKD, hx kidney stones.   Care Team: Primary Care Provider: Hope Merle, MD ? [facility] (future). Office visit 11/02/24 w Dr. Onesimo.  Provider of the day: Scott   Known DM Complications: retinopathy, HTN, CAD, Diabetic Kidney Disease with microalbuminuria, hx retinopathy documented  Summary of recent change: 9/30: Long discussion risk/benefit of +SGLT2i. Patient amenable. Start Jardiance  10 mg/d, ??Tresiba  45 to 42 u/d.  9/8: UACR elevated. SGLt2i recommended though cost concerns  8/5: SMBG very well controlled though continued significant freq post-dinner lows w FBG all WNL. Hold evening dose of Humalog  7/24: Reduce losartan  50 mg BID ? 50 mg at bedtime (hypotension) 6/10: ?? Tresiba  10% - 50 to 45 units daily 2/2 fasting hypoglycemia.   Since Last Visit: #Diabetes Since last visit, reports doing well overall. No concerns since starting Jardiance  last month. Did not reduce Tresiba  though acknowledges recalling plan to reduce to 43 units. Notes sometimes sugars in the morning are on the lower end in the 60s. Feels this is mostly related to food choices in the afternoon/evening. If having lighter dinner (salad) will take 1 metformin  tablet instead of 2. Generally, FBG range 80s-116 mg/dL on average.   Continues to struggle with hydration, intake unchanged from last month. 2 coffees every morning. Reports personal goal of ~5 bottles water per day though struggles to meet this.   Reported Regimen:  Tresiba  45 units  once daily (morning) - Did not reduce to 43u as instructed Humalog  5 units before breakfast (PRN 5 units w dinner only for large, carb-heavy meals) Metformin  XR 1000 mg twice daily Jardiance  10 mg each morning  SMBG: Libre 3+ (uses reader) Average glucose (Last 7 Days): 132 mg/dL Average glucose by time of day:     12 am - 6 am: 111 (3 lows)        6 am - 12 pm: 126      12 pm - 6 pm: 141      6 pm - 12 am: 155   #Hypertension Continues checking blood pressures at home intermittently (lately, has been checking twice daily). Takes BP medication in the evening. One day notes pressure ~90/70. No further symptoms of low blood pressures (symptoms tend to occur with systolics <100).   SMBP: Morning 117/64-131/73 mmHg. Lowest 88/55 one morning (skipped dinner night before, dehydrated)  Reported Regimen:  Losartan  50 mg once daily (evening)  Objective  BP Readings from Last 3 Encounters:  08/03/24 118/66  08/03/24 125/75  04/20/24 124/66   Lab Results  Component Value Date   HGBA1C 6.9 (H) 08/03/2024   HGBA1C 8.5 (A) 04/20/2024   HGBA1C 7.8 (A) 01/21/2024   GLUCOSE 123 (H) 08/03/2024   GLUCOSE 202 (H) 01/21/2024   GLUCOSE 161 (H) 08/14/2023   MICRALBCREAT 106.3 (H) 08/03/2024   MICRALBCREAT 19 05/29/2020   MICRALBCREAT 40.4 (H) 11/07/2009   CREATININE 1.08 08/03/2024   CREATININE 1.07 01/21/2024   CREATININE 1.08 08/14/2023   GFR 66.79 08/03/2024   GFR 67.80 01/21/2024   GFR 67.25 08/14/2023   Lab Results  Component  Value Date   CHOL 84 08/03/2024   LDLDIRECT 46.0 08/31/2018   AST 17 08/03/2024   ALT 15 08/03/2024     Chemistry      Component Value Date/Time   NA 138 08/03/2024 1433   K 4.4 08/03/2024 1433   CL 101 08/03/2024 1433   CO2 27 08/03/2024 1433   BUN 18 08/03/2024 1433   CREATININE 1.08 08/03/2024 1433      Component Value Date/Time   CALCIUM  10.2 08/03/2024 1433   ALKPHOS 46 08/03/2024 1433   AST 17 08/03/2024 1433   ALT 15 08/03/2024 1433    BILITOT 0.9 08/03/2024 1433      Assessment/Plan:  #Diabetes: Great Control per CGM today, improved from last visit after initiation of Jardiance  with significantly less hyperglycemia. Sugars overall more stable on CGM with ABG 132 mg/dL. Tolerating Jardiance  w/o concerns. Low morning sugar ~every other day though did not reduce Tresiba  as instructed. Agreeable to reduction today.  Hypoglycemia: No longer having mid-day lows related to Humalog  use. Still occasional lows isolated to overnight 2/2 skipping meals/light dinner.  Current Regimen: Jardiance  10 mg/d, MTF 100mg  BID, tresiba  45 u/d, Huamlog 5u AC breakfast/dinner if high-carb meal Reduce Tresiba  to 40 units once daily per occasional overnight lows Continue Humalog  5 units AC breakfast. 5u AC dinner only if having high-carb meal (ex spaghetti, out to eat, etc.) Continue metformin , Jardiance   Future consideration: Following repeat labs, consider Jardiance  increase to 25 mg daily with further Tresiba  reduction given favorable safety profile.    #Hypertension: Controlled per last clinic pressure 118/66 mmHg. Hypotension improved with consistency of losartan  50 mg once daily rather than twice daily over the past few months.  One reading with systolic <100, feels he may have been dehydrated/skipped meal.  Continue losartan  50 mg daily Reduce caffeine from 2 cups coffee to 1 cup coffee each morning (or half-caf) Maintain adequate hydration. Consider water flavoring (sugar-free) to help increase fluid intake.  Reviewed importance of not skipping meals and remaining well-hydrated, especially before traveling or starting yard work.   Follow Up: PharmD follow up via phone 3 weeks Office visit scheduled 12/10 w Dr. Onesimo [facility] scheduled with Dr. Abbey heinrich 2026. Patient given direct line for further questions/concerns while away on vacation.    Future Appointments  Date Time Provider Department Center  10/11/2024  9:00 AM [facility]  PHARMACIST [facility] [address]  11/02/2024  1:00 PM Onesimo Claude, MD [facility] [address]  12/19/2024  1:00 PM Abbey Bruckner, MD [facility] [address]  12/29/2024 10:15 AM Gaynel Delon CROME, DPM [facility]  03/30/2025 10:30 AM Francisca Redell BROCKS, MD [facility] None  08/08/2025  8:50 AM [facility] ANNUAL WELLNESS VISIT [facility] [address] FABIENE Geronimo, PharmD Clinical Pharmacist [facility] [phone]

## 2024-09-21 ENCOUNTER — Telehealth: Payer: Self-pay

## 2024-09-21 NOTE — Telephone Encounter (Signed)
 Copied from CRM [id number]. Topic: Clinical - Medical Advice >> Sep 20, 2024 12:31 PM Viola F wrote: Connell from [employer] seen patient this morning - his glucose monitor went off and he didn't hear it because he has hearing proiblems. She wants to make the office aware that he may need hearing aids. Please advise. Her call back number if needed is [phone]

## 2024-09-22 ENCOUNTER — Other Ambulatory Visit: Payer: Self-pay | Admitting: Internal Medicine

## 2024-09-22 DIAGNOSIS — H9190 Unspecified hearing loss, unspecified ear: Secondary | ICD-10-CM

## 2024-09-22 NOTE — Telephone Encounter (Signed)
 Called Carly at [employer] and the Patient to let them know that Dr. Onesimo put a referral in for audiology regarding hearing aids.

## 2024-10-11 ENCOUNTER — Other Ambulatory Visit

## 2024-11-02 ENCOUNTER — Ambulatory Visit: Admitting: Internal Medicine

## 2024-11-28 ENCOUNTER — Encounter: Payer: Self-pay | Admitting: Internal Medicine

## 2024-11-28 ENCOUNTER — Ambulatory Visit: Admitting: Internal Medicine

## 2024-11-28 VITALS — BP 118/76 | HR 74 | Temp 98.3°F | Ht 72.0 in | Wt 186.8 lb

## 2024-11-28 DIAGNOSIS — I152 Hypertension secondary to endocrine disorders: Secondary | ICD-10-CM | POA: Diagnosis not present

## 2024-11-28 DIAGNOSIS — Z794 Long term (current) use of insulin: Secondary | ICD-10-CM

## 2024-11-28 DIAGNOSIS — Z7984 Long term (current) use of oral hypoglycemic drugs: Secondary | ICD-10-CM

## 2024-11-28 DIAGNOSIS — E118 Type 2 diabetes mellitus with unspecified complications: Secondary | ICD-10-CM

## 2024-11-28 DIAGNOSIS — E1159 Type 2 diabetes mellitus with other circulatory complications: Secondary | ICD-10-CM | POA: Diagnosis not present

## 2024-11-28 LAB — POCT GLYCOSYLATED HEMOGLOBIN (HGB A1C): Hemoglobin A1C: 6.9 % — AB (ref 4.0–5.6)

## 2024-11-28 MED ORDER — TRESIBA FLEXTOUCH 200 UNIT/ML ~~LOC~~ SOPN: 45.0000 [IU] | PEN_INJECTOR | Freq: Every day | SUBCUTANEOUS | 0 refills | Status: AC

## 2024-11-28 MED ORDER — LOSARTAN POTASSIUM 50 MG PO TABS: 50.0000 mg | ORAL_TABLET | Freq: Every day | ORAL | 1 refills | Status: AC

## 2024-11-28 NOTE — Assessment & Plan Note (Signed)
-   This problem is chronic and stable -Patient is currently on Tresiba  45 units at night as well as Humalog  4 units before breakfast (as needed) as well as 4 units before dinner (as needed), metformin  1000 mg twice a day as well as Jardiance  10 mg daily -He was noted to have proteinuria on his last visit - We discussed the importance of starting an SGLT2 inhibitor and he is currently on Jardiance  10 mg daily -We will recheck his urine microalbumin creatinine ratio today.  If it remains elevated I would consider increasing his Jardiance  to 25 mg daily -Repeat A1c today was at goal (6.9) -No further workup at this time

## 2024-11-28 NOTE — Assessment & Plan Note (Signed)
-   This problem is chronic and stable -Patient states his blood pressure does go occasionally low on his blood pressure medication but usually these are on days that he is not hydrating himself appropriately -Will continue with losartan  50 mg daily for now -Refill sent to the pharmacy -Blood pressure today is at goal (118/76) -No further workup at this time

## 2024-11-28 NOTE — Progress Notes (Signed)
 "  Acute Office Visit  Subjective:     Patient ID: [name], male    DOB: 02-02-1946, 79 y.o.   MRN: [id number]  Chief Complaint  Patient presents with   Medical Management of Chronic Issues   Discussed the use of AI scribe software for clinical note transcription with the patient, who gave verbal consent to proceed.  History of Present Illness [name] is a 79 year old male with diabetes who presents for follow-up of blood sugar control and kidney function.  Glycemic control - Diabetes management stable with A1c 6.9%, consistent with previous visit - Glucose meter shows blood sugar within target range 75-80% of the time - Tresiba  45 units daily and Humalog  4 units as needed, typically before breakfast and dinner - Blood sugar rises over 200 mg/dL after consuming cereal, especially Raisin Bran - Bacon and eggs help maintain stable blood sugar levels  Renal function - Started Jardiance  10 mg dailyafter last visit - History of proteinuria at last visit  Blood pressure management - On losartan  for hypertension, with effective control - Occasional borderline low blood pressure - No lightheadedness or dizziness - Attempts to maintain hydration with four bottles of water and two cups of coffee daily, but sometimes falls short  Respiratory symptoms - Occasional cough - No fever or chills  Immunization status - Up to date on vaccinations, including influenza vaccine - Plans to receive tetanus booster soon    Review of Systems  Constitutional: Negative.   HENT: Negative.    Respiratory: Negative.    Cardiovascular: Negative.   Gastrointestinal: Negative.   Musculoskeletal: Negative.   Neurological: Negative.   Psychiatric/Behavioral: Negative.          Objective:    BP 118/76   Pulse 74   Temp 98.3 F (36.8 C)   Ht 6' (1.829 m)   Wt 186 lb 12.8 oz (84.7 kg)   SpO2 97%   BMI 25.33 kg/m    Physical Exam Constitutional:      Appearance: Normal  appearance.  HENT:     Head: Normocephalic and atraumatic.  Cardiovascular:     Rate and Rhythm: Normal rate and regular rhythm.     Heart sounds: Normal heart sounds.  Pulmonary:     Effort: Pulmonary effort is normal.     Breath sounds: Normal breath sounds. No wheezing, rhonchi or rales.  Abdominal:     General: Bowel sounds are normal. There is no distension.     Palpations: Abdomen is soft.     Tenderness: There is no abdominal tenderness. There is no guarding or rebound.  Musculoskeletal:        General: No swelling or tenderness.     Right lower leg: No edema.     Left lower leg: No edema.  Neurological:     Mental Status: He is alert.  Psychiatric:        Mood and Affect: Mood normal.        Behavior: Behavior normal.     Results for orders placed or performed in visit on 11/28/24  POCT glycosylated hemoglobin (Hb A1C)  Result Value Ref Range   Hemoglobin A1C 6.9 (A) 4.0 - 5.6 %   HbA1c POC (<> result, manual entry)     HbA1c, POC (prediabetic range)     HbA1c, POC (controlled diabetic range)          Assessment & Plan:   Problem List Items Addressed This Visit  Cardiovascular and Mediastinum   Hypertension associated with diabetes (HCC) - Primary   - This problem is chronic and stable -Patient states his blood pressure does go occasionally low on his blood pressure medication but usually these are on days that he is not hydrating himself appropriately -Will continue with losartan  50 mg daily for now -Refill sent to the pharmacy -Blood pressure today is at goal (118/76) -No further workup at this time      Relevant Medications   losartan  (COZAAR ) 50 MG tablet   insulin  degludec (TRESIBA  FLEXTOUCH) 200 UNIT/ML FlexTouch Pen     Endocrine   Type 2 diabetes mellitus with complications (HCC)   - This problem is chronic and stable -Patient is currently on Tresiba  45 units at night as well as Humalog  4 units before breakfast (as needed) as well as 4  units before dinner (as needed), metformin  1000 mg twice a day as well as Jardiance  10 mg daily -He was noted to have proteinuria on his last visit - We discussed the importance of starting an SGLT2 inhibitor and he is currently on Jardiance  10 mg daily -We will recheck his urine microalbumin creatinine ratio today.  If it remains elevated I would consider increasing his Jardiance  to 25 mg daily -Repeat A1c today was at goal (6.9) -No further workup at this time      Relevant Medications   losartan  (COZAAR ) 50 MG tablet   insulin  degludec (TRESIBA  FLEXTOUCH) 200 UNIT/ML FlexTouch Pen   Other Relevant Orders   POCT glycosylated hemoglobin (Hb A1C) (Completed)   Microalbumin / creatinine urine ratio    Meds ordered this encounter  Medications   losartan  (COZAAR ) 50 MG tablet    Sig: Take 1 tablet (50 mg total) by mouth daily.    Dispense:  90 tablet    Refill:  1   insulin  degludec (TRESIBA  FLEXTOUCH) 200 UNIT/ML FlexTouch Pen    Sig: Inject 46 Units into the skin daily.    Dispense:  18 mL    Refill:  0    No follow-ups on file.  Azar South, MD   "

## 2024-11-28 NOTE — Patient Instructions (Signed)
" °  VISIT SUMMARY: Today, you came in for a follow-up visit to check on your blood sugar control and kidney function. We discussed your diabetes management, kidney health, blood pressure, and general health maintenance.  YOUR PLAN: -TYPE 2 DIABETES MELLITUS WITH COMPLICATIONS: Your diabetes is generally well-controlled with an A1c of 6.9%. However, your blood sugar tends to rise after eating cereal, especially Raisin Bran. To help manage this, you should take Humalog  before breakfast when you plan to eat cereal. Continue taking Tresiba  45 units daily, Metformin  1000 mg twice a day, and Jardiance  10 mg daily. Jardiance  may also help lower your A1c and reduce protein in your urine.  -CHRONIC KIDNEY DISEASE WITH PROTEINURIA: Proteinuria means there is protein in your urine, which can indicate kidney damage. You are taking Jardiance  to help reduce this protein and improve kidney function. We will recheck your urine protein levels to see how well the treatment is working. Continue taking Jardiance  10 mg daily.  -HYPERTENSION: Your blood pressure is well-controlled with your current medication, Losartan . Sometimes your blood pressure is a bit low, which might be due to not drinking enough fluids. Continue taking Losartan  50 mg daily and make sure to stay well-hydrated, especially in hot weather.  -GENERAL HEALTH MAINTENANCE: You are up to date on all your vaccines except for the shingles vaccine, which is due every ten years.  INSTRUCTIONS: Please take Humalog  before breakfast when you plan to eat cereal to prevent high blood sugar. Continue with your current medications: Tresiba  45 units daily, Metformin  1000 mg twice a day, Jardiance  10 mg daily, and Losartan  50 mg daily. Make sure to stay well-hydrated, especially in hot weather. We will recheck your urine protein levels to monitor your kidney function.  Please follow-up with your PCP on January 26                      Contains text  generated by [employer].                                 Contains text generated by [employer].   "

## 2024-12-19 ENCOUNTER — Encounter

## 2024-12-23 ENCOUNTER — Other Ambulatory Visit: Payer: Self-pay

## 2024-12-23 DIAGNOSIS — N400 Enlarged prostate without lower urinary tract symptoms: Secondary | ICD-10-CM

## 2024-12-23 DIAGNOSIS — E118 Type 2 diabetes mellitus with unspecified complications: Secondary | ICD-10-CM

## 2024-12-23 NOTE — Telephone Encounter (Signed)
 Copied from CRM [id number]. Topic: Clinical - Medication Refill >> Dec 23, 2024 11:45 AM Rea ORN wrote: Medication:  tamsulosin  (FLOMAX ) 0.4 MG CAPS capsule  metFORMIN  (GLUCOPHAGE -XR) 500 MG 24 hr tablet dutasteride  (AVODART ) 0.5 MG capsule   Has the patient contacted their pharmacy? Yes (Agent: If no, request that the patient contact the pharmacy for the refill. If patient does not wish to contact the pharmacy document the reason why and proceed with request.) (Agent: If yes, when and what did the pharmacy advise?)  This is the patient's preferred pharmacy:  [id number] - [place], [place] - [address] [place] [address] Phone: [phone] Fax: [phone]  Is this the correct pharmacy for this prescription? Yes If no, delete pharmacy and type the correct one.   Has the prescription been filled recently? No  Is the patient out of the medication? No  Has the patient been seen for an appointment in the last year OR does the patient have an upcoming appointment? Yes  Can we respond through MyChart? No  Agent: Please be advised that Rx refills may take up to 3 business days. We ask that you follow-up with your pharmacy.

## 2024-12-23 NOTE — Addendum Note (Signed)
 Addended by: Heddy Vidana on: 12/23/2024 01:06 PM   Modules accepted: Orders

## 2024-12-23 NOTE — Telephone Encounter (Signed)
 The tamsulosin  and dutasteride  have previously been filled from the urologist. Pt stated that he was not impressed with the provider and does not want to go back to see him so he would like to see if these two medications plus the metformin  can be refilled. Pt was a previous pt of Dr. Hope and scheduled for a TOC with Dr. Abbey in Feb. Is it okay to refill?

## 2024-12-24 MED ORDER — METFORMIN HCL ER 500 MG PO TB24: 1000.0000 mg | ORAL_TABLET | Freq: Two times a day (BID) | ORAL | 0 refills | Status: AC

## 2024-12-24 MED ORDER — TAMSULOSIN HCL 0.4 MG PO CAPS: 0.4000 mg | ORAL_CAPSULE | Freq: Every day | ORAL | 0 refills | Status: AC

## 2024-12-24 MED ORDER — DUTASTERIDE 0.5 MG PO CAPS: 0.5000 mg | ORAL_CAPSULE | Freq: Every day | ORAL | 0 refills | Status: AC

## 2024-12-29 ENCOUNTER — Ambulatory Visit: Admitting: Podiatry

## 2024-12-29 ENCOUNTER — Encounter: Payer: Self-pay | Admitting: Podiatry

## 2025-01-05 ENCOUNTER — Encounter

## 2025-03-27 ENCOUNTER — Ambulatory Visit: Admitting: Podiatry

## 2025-03-29 ENCOUNTER — Ambulatory Visit: Admitting: Urology

## 2025-03-30 ENCOUNTER — Ambulatory Visit: Admitting: Urology

## 2025-08-08 ENCOUNTER — Ambulatory Visit
